# Patient Record
Sex: Male | Born: 1990
Health system: Southern US, Community
[De-identification: ages and names within clinical notes are randomized; demographics above are authoritative.]

## PROBLEM LIST (undated history)

## (undated) DIAGNOSIS — I10 Essential (primary) hypertension: Secondary | ICD-10-CM

## (undated) DIAGNOSIS — E212 Other hyperparathyroidism: Secondary | ICD-10-CM

## (undated) DIAGNOSIS — Z8739 Personal history of other diseases of the musculoskeletal system and connective tissue: Secondary | ICD-10-CM

## (undated) DIAGNOSIS — F419 Anxiety disorder, unspecified: Secondary | ICD-10-CM

## (undated) DIAGNOSIS — E559 Vitamin D deficiency, unspecified: Secondary | ICD-10-CM

## (undated) DIAGNOSIS — R51 Headache: Secondary | ICD-10-CM

## (undated) DIAGNOSIS — R3912 Poor urinary stream: Secondary | ICD-10-CM

## (undated) DIAGNOSIS — G8929 Other chronic pain: Secondary | ICD-10-CM

## (undated) DIAGNOSIS — N201 Calculus of ureter: Secondary | ICD-10-CM

## (undated) DIAGNOSIS — M25559 Pain in unspecified hip: Secondary | ICD-10-CM

## (undated) DIAGNOSIS — N200 Calculus of kidney: Secondary | ICD-10-CM

## (undated) DIAGNOSIS — R319 Hematuria, unspecified: Secondary | ICD-10-CM

## (undated) DIAGNOSIS — E782 Mixed hyperlipidemia: Secondary | ICD-10-CM

## (undated) DIAGNOSIS — R519 Headache, unspecified: Secondary | ICD-10-CM

## (undated) HISTORY — PX: URETEROLITHOTOMY: SHX71

## (undated) HISTORY — PX: EXTRACORPOREAL SHOCK WAVE LITHOTRIPSY: SHX1557

---
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
## 2009-02-28 ENCOUNTER — Ambulatory Visit (HOSPITAL_COMMUNITY): Admission: RE | Admit: 2009-02-28 | Discharge: 2009-02-28 | Payer: Self-pay | Admitting: Internal Medicine

## 2009-03-19 ENCOUNTER — Ambulatory Visit (HOSPITAL_COMMUNITY): Payer: Self-pay | Admitting: Psychology

## 2009-04-05 ENCOUNTER — Ambulatory Visit (HOSPITAL_COMMUNITY): Payer: Self-pay | Admitting: Psychology

## 2009-04-16 ENCOUNTER — Emergency Department: Payer: Self-pay | Admitting: Emergency Medicine

## 2009-04-25 ENCOUNTER — Emergency Department: Payer: Self-pay | Admitting: Emergency Medicine

## 2009-05-14 ENCOUNTER — Emergency Department: Payer: Self-pay | Admitting: Emergency Medicine

## 2009-05-20 ENCOUNTER — Emergency Department: Payer: Self-pay | Admitting: Emergency Medicine

## 2009-05-22 ENCOUNTER — Ambulatory Visit: Payer: Self-pay | Admitting: Urology

## 2009-05-29 ENCOUNTER — Ambulatory Visit: Payer: Self-pay | Admitting: Urology

## 2009-07-03 ENCOUNTER — Emergency Department: Payer: Self-pay | Admitting: Unknown Physician Specialty

## 2009-08-04 ENCOUNTER — Emergency Department: Payer: Self-pay | Admitting: Emergency Medicine

## 2009-08-29 ENCOUNTER — Emergency Department: Payer: Self-pay | Admitting: Emergency Medicine

## 2009-09-01 ENCOUNTER — Emergency Department: Payer: Self-pay | Admitting: Emergency Medicine

## 2009-09-18 ENCOUNTER — Emergency Department (HOSPITAL_COMMUNITY): Admission: EM | Admit: 2009-09-18 | Discharge: 2009-09-19 | Payer: Self-pay | Admitting: Emergency Medicine

## 2009-10-19 ENCOUNTER — Emergency Department (HOSPITAL_COMMUNITY): Admission: EM | Admit: 2009-10-19 | Discharge: 2009-10-19 | Payer: Self-pay | Admitting: Emergency Medicine

## 2009-10-30 ENCOUNTER — Ambulatory Visit: Payer: Self-pay | Admitting: Urology

## 2009-11-07 ENCOUNTER — Ambulatory Visit: Payer: Self-pay | Admitting: Urology

## 2009-11-09 ENCOUNTER — Emergency Department (HOSPITAL_COMMUNITY): Admission: EM | Admit: 2009-11-09 | Discharge: 2009-11-10 | Payer: Self-pay | Admitting: Emergency Medicine

## 2009-12-27 ENCOUNTER — Emergency Department (HOSPITAL_COMMUNITY): Admission: EM | Admit: 2009-12-27 | Discharge: 2009-12-28 | Payer: Self-pay | Admitting: Emergency Medicine

## 2010-01-26 ENCOUNTER — Emergency Department: Payer: Self-pay | Admitting: Emergency Medicine

## 2010-02-10 ENCOUNTER — Emergency Department (HOSPITAL_COMMUNITY): Admission: EM | Admit: 2010-02-10 | Discharge: 2010-02-11 | Payer: Self-pay | Admitting: Emergency Medicine

## 2010-03-28 ENCOUNTER — Emergency Department (HOSPITAL_COMMUNITY)
Admission: EM | Admit: 2010-03-28 | Discharge: 2010-03-29 | Payer: Self-pay | Source: Home / Self Care | Admitting: Emergency Medicine

## 2010-04-07 LAB — BASIC METABOLIC PANEL
BUN: 10 mg/dL (ref 6–23)
CO2: 24 mEq/L (ref 19–32)
Calcium: 8.8 mg/dL (ref 8.4–10.5)
Chloride: 109 mEq/L (ref 96–112)
Creatinine, Ser: 0.99 mg/dL (ref 0.4–1.5)
GFR calc Af Amer: >60 mL/min (ref >60)
GFR calc non Af Amer: >60 mL/min (ref >60)
Glucose, Bld: 104 mg/dL — ABNORMAL HIGH (ref 70–99)
Potassium: 3.5 mEq/L (ref 3.5–5.1)
Sodium: 140 mEq/L (ref 135–145)

## 2010-04-07 LAB — URINALYSIS, ROUTINE W REFLEX MICROSCOPIC
Bilirubin Urine: NEGATIVE
Ketones, ur: NEGATIVE mg/dL
Leukocytes, UA: NEGATIVE
Nitrite: NEGATIVE
Protein, ur: NEGATIVE mg/dL
Specific Gravity, Urine: 1.02 (ref 1.005–1.030)
Urine Glucose, Fasting: NEGATIVE mg/dL
Urobilinogen, UA: 0.2 mg/dL (ref 0.0–1.0)
pH: 6.5 (ref 5.0–8.0)

## 2010-04-07 LAB — URINE MICROSCOPIC-ADD ON

## 2010-04-16 ENCOUNTER — Ambulatory Visit: Payer: Self-pay | Admitting: Urology

## 2010-04-26 ENCOUNTER — Emergency Department: Payer: Self-pay | Admitting: Emergency Medicine

## 2010-06-03 LAB — COMPREHENSIVE METABOLIC PANEL
ALT: 26 U/L (ref 0–53)
AST: 25 U/L (ref 0–37)
Albumin: 4.1 g/dL (ref 3.5–5.2)
Alkaline Phosphatase: 59 U/L (ref 39–117)
BUN: 7 mg/dL (ref 6–23)
CO2: 27 mEq/L (ref 19–32)
Calcium: 9.6 mg/dL (ref 8.4–10.5)
Chloride: 106 mEq/L (ref 96–112)
Creatinine, Ser: 1.02 mg/dL (ref 0.4–1.5)
GFR calc Af Amer: >60 mL/min (ref >60)
GFR calc non Af Amer: >60 mL/min (ref >60)
Glucose, Bld: 95 mg/dL (ref 70–99)
Potassium: 4 mEq/L (ref 3.5–5.1)
Sodium: 143 mEq/L (ref 135–145)
Total Bilirubin: 0.3 mg/dL (ref 0.3–1.2)
Total Protein: 6.7 g/dL (ref 6.0–8.3)

## 2010-06-03 LAB — DIFFERENTIAL
Basophils Absolute: 0.1 10*3/uL (ref 0.0–0.1)
Basophils Relative: 1 % (ref 0–1)
Eosinophils Absolute: 0.3 10*3/uL (ref 0.0–0.7)
Eosinophils Relative: 3 % (ref 0–5)
Lymphocytes Relative: 34 % (ref 12–46)
Lymphs Abs: 2.9 10*3/uL (ref 0.7–4.0)
Monocytes Absolute: 0.9 10*3/uL (ref 0.1–1.0)
Monocytes Relative: 11 % (ref 3–12)
Neutro Abs: 4.4 10*3/uL (ref 1.7–7.7)
Neutrophils Relative %: 52 % (ref 43–77)

## 2010-06-03 LAB — URINALYSIS, ROUTINE W REFLEX MICROSCOPIC
Bilirubin Urine: NEGATIVE
Glucose, UA: NEGATIVE mg/dL
Ketones, ur: NEGATIVE mg/dL
Leukocytes, UA: NEGATIVE
Nitrite: NEGATIVE
Protein, ur: NEGATIVE mg/dL
Specific Gravity, Urine: 1.015 (ref 1.005–1.030)
Urobilinogen, UA: 0.2 mg/dL (ref 0.0–1.0)
pH: 7 (ref 5.0–8.0)

## 2010-06-03 LAB — CBC
HCT: 40.7 % (ref 39.0–52.0)
Hemoglobin: 14.8 g/dL (ref 13.0–17.0)
MCH: 33 pg (ref 26.0–34.0)
MCHC: 36.4 g/dL — ABNORMAL HIGH (ref 30.0–36.0)
MCV: 90.8 fL (ref 78.0–100.0)
Platelets: 294 10*3/uL (ref 150–400)
RBC: 4.48 MIL/uL (ref 4.22–5.81)
RDW: 12.5 % (ref 11.5–15.5)
WBC: 8.5 10*3/uL (ref 4.0–10.5)

## 2010-06-03 LAB — URINE CULTURE
Colony Count: NO GROWTH
Culture  Setup Time: 201111221210
Culture: NO GROWTH

## 2010-06-03 LAB — URINE MICROSCOPIC-ADD ON

## 2010-06-05 LAB — URINALYSIS, ROUTINE W REFLEX MICROSCOPIC
Bilirubin Urine: NEGATIVE
Glucose, UA: NEGATIVE mg/dL
Ketones, ur: NEGATIVE mg/dL
Leukocytes, UA: NEGATIVE
Nitrite: NEGATIVE
Protein, ur: NEGATIVE mg/dL
Specific Gravity, Urine: 1.015 (ref 1.005–1.030)
Urobilinogen, UA: 0.2 mg/dL (ref 0.0–1.0)
pH: 7 (ref 5.0–8.0)

## 2010-06-05 LAB — URINE MICROSCOPIC-ADD ON

## 2010-06-06 LAB — URINALYSIS, ROUTINE W REFLEX MICROSCOPIC
Bilirubin Urine: NEGATIVE
Glucose, UA: NEGATIVE mg/dL
Leukocytes, UA: NEGATIVE
Nitrite: NEGATIVE
Protein, ur: NEGATIVE mg/dL
Specific Gravity, Urine: 1.02 (ref 1.005–1.030)
Urobilinogen, UA: 0.2 mg/dL (ref 0.0–1.0)
pH: 7 (ref 5.0–8.0)

## 2010-06-06 LAB — URINE CULTURE
Colony Count: NO GROWTH
Culture  Setup Time: 201108212245
Culture: NO GROWTH

## 2010-06-06 LAB — URINE MICROSCOPIC-ADD ON

## 2010-06-07 LAB — URINALYSIS, ROUTINE W REFLEX MICROSCOPIC
Bilirubin Urine: NEGATIVE
Glucose, UA: NEGATIVE mg/dL
Leukocytes, UA: NEGATIVE
Nitrite: NEGATIVE
Protein, ur: 30 mg/dL — AB
Specific Gravity, Urine: 1.025 (ref 1.005–1.030)
Urobilinogen, UA: 0.2 mg/dL (ref 0.0–1.0)
pH: 6.5 (ref 5.0–8.0)

## 2010-06-07 LAB — URINE MICROSCOPIC-ADD ON

## 2010-06-08 LAB — RAPID URINE DRUG SCREEN, HOSP PERFORMED
Benzodiazepines: POSITIVE — AB
Cocaine: NOT DETECTED
Tetrahydrocannabinol: NOT DETECTED

## 2010-06-08 LAB — BASIC METABOLIC PANEL
Calcium: 9.9 mg/dL (ref 8.4–10.5)
Creatinine, Ser: 1.04 mg/dL (ref 0.4–1.5)
GFR calc Af Amer: >60 mL/min (ref >60)
GFR calc non Af Amer: >60 mL/min (ref >60)
Sodium: 141 mEq/L (ref 135–145)

## 2010-06-08 LAB — URINALYSIS, ROUTINE W REFLEX MICROSCOPIC
Bilirubin Urine: NEGATIVE
Nitrite: NEGATIVE
Specific Gravity, Urine: 1.025 (ref 1.005–1.030)
Urobilinogen, UA: 0.2 mg/dL (ref 0.0–1.0)
pH: 6 (ref 5.0–8.0)

## 2010-06-08 LAB — DIFFERENTIAL
Eosinophils Absolute: 0.4 10*3/uL (ref 0.0–0.7)
Lymphocytes Relative: 32 % (ref 12–46)
Lymphs Abs: 3.3 10*3/uL (ref 0.7–4.0)
Monocytes Relative: 10 % (ref 3–12)
Neutro Abs: 5.6 10*3/uL (ref 1.7–7.7)
Neutrophils Relative %: 53 % (ref 43–77)

## 2010-06-08 LAB — BLOOD GAS, ARTERIAL
Bicarbonate: 20.9 mEq/L (ref 20.0–24.0)
O2 Saturation: 97 %
Patient temperature: 37
TCO2: 18.1 mmol/L (ref 0–100)

## 2010-06-08 LAB — CBC
Hemoglobin: 14.9 g/dL (ref 13.0–17.0)
Platelets: 305 10*3/uL (ref 150–400)
RBC: 4.66 MIL/uL (ref 4.22–5.81)
WBC: 10.4 10*3/uL (ref 4.0–10.5)

## 2010-06-08 LAB — D-DIMER, QUANTITATIVE: D-Dimer, Quant: 0.22 ug/mL-FEU (ref 0.00–0.48)

## 2010-06-08 LAB — SALICYLATE LEVEL: Salicylate Lvl: <4 mg/dL (ref 2.8–20.0)

## 2010-06-08 LAB — ETHANOL: Alcohol, Ethyl (B): <5 mg/dL (ref 0–10)

## 2010-06-08 LAB — ACETAMINOPHEN LEVEL: Acetaminophen (Tylenol), Serum: <10 ug/mL — ABNORMAL LOW (ref 10–30)

## 2010-06-16 ENCOUNTER — Ambulatory Visit: Payer: Self-pay | Admitting: Urology

## 2010-08-19 ENCOUNTER — Emergency Department: Payer: Self-pay | Admitting: Emergency Medicine

## 2010-09-11 ENCOUNTER — Ambulatory Visit: Payer: Self-pay | Admitting: Urology

## 2010-11-09 ENCOUNTER — Emergency Department: Payer: Self-pay | Admitting: *Deleted

## 2011-01-09 ENCOUNTER — Ambulatory Visit: Payer: Self-pay | Admitting: Internal Medicine

## 2011-01-09 DIAGNOSIS — Z0289 Encounter for other administrative examinations: Secondary | ICD-10-CM

## 2011-01-16 ENCOUNTER — Emergency Department (HOSPITAL_COMMUNITY): Payer: 59

## 2011-01-16 ENCOUNTER — Emergency Department (HOSPITAL_COMMUNITY)
Admission: EM | Admit: 2011-01-16 | Discharge: 2011-01-16 | Disposition: A | Discharge status: Home / Self Care | Payer: 59 | Attending: Emergency Medicine | Admitting: Emergency Medicine

## 2011-01-16 DIAGNOSIS — F17200 Nicotine dependence, unspecified, uncomplicated: Secondary | ICD-10-CM | POA: Insufficient documentation

## 2011-01-16 DIAGNOSIS — J40 Bronchitis, not specified as acute or chronic: Secondary | ICD-10-CM

## 2011-01-16 MED ORDER — PREDNISONE 10 MG PO TABS: ORAL_TABLET | ORAL | Status: DC

## 2011-01-16 MED ORDER — METHYLPREDNISOLONE SODIUM SUCC 125 MG IJ SOLR
125.0000 mg | Freq: Once | INTRAMUSCULAR | Status: AC
  Administered 2011-01-16: 125 mg via INTRAMUSCULAR
  Filled 2011-01-16: qty 2

## 2011-01-16 MED ORDER — PROMETHAZINE-CODEINE 6.25-10 MG/5ML PO SYRP: 5.0000 mL | ORAL_SOLUTION | Freq: Four times a day (QID) | ORAL | Status: AC | PRN

## 2011-01-16 MED ORDER — DOXYCYCLINE HYCLATE 100 MG PO TABS
100.0000 mg | ORAL_TABLET | Freq: Once | ORAL | Status: AC
  Administered 2011-01-16: 100 mg via ORAL
  Filled 2011-01-16: qty 1

## 2011-01-16 MED ORDER — DOXYCYCLINE HYCLATE 100 MG PO CAPS: 100.0000 mg | ORAL_CAPSULE | Freq: Two times a day (BID) | ORAL | Status: AC

## 2011-01-16 MED ORDER — ALBUTEROL SULFATE HFA 108 (90 BASE) MCG/ACT IN AERS
2.0000 | INHALATION_SPRAY | RESPIRATORY_TRACT | Status: DC
  Administered 2011-01-16: 2 via RESPIRATORY_TRACT
  Filled 2011-01-16: qty 6.7

## 2011-01-16 NOTE — ED Notes (Signed)
Pt presents with cough and chest congestion x 1 month. Pt denies seeing primary doctor. Pt states he has been taking OTC medication. NAD at this time. Pt ambulated to triage with steady gate.

## 2011-01-16 NOTE — ED Notes (Addendum)
C/o cough for a month, sometimes yellow sputum production, ?fever at times, pain to left rib area from coughing,

## 2011-01-16 NOTE — ED Provider Notes (Signed)
History     CSN: [id number] Arrival date & time: 01/16/2011  2:01 PM   None     Chief Complaint  Patient presents with  . Cough  . Nasal Congestion    (Consider location/radiation/quality/duration/timing/severity/associated sxs/prior treatment) Patient is a 20 y.o. male presenting with cough. The history is provided by the patient.  Cough This is a new problem. The current episode started more than 1 week ago. The problem has not changed since onset.The cough is productive of sputum. There has been no fever. Associated symptoms include chills, headaches, sore throat, shortness of breath and wheezing. Pertinent negatives include no chest pain. He has tried cough syrup for the symptoms. The treatment provided no relief. He is a smoker. His past medical history is significant for bronchitis and pneumonia. His past medical history does not include COPD or asthma.    Past Medical History  Diagnosis Date  . Renal disorder     History reviewed. No pertinent past surgical history.  History reviewed. No pertinent family history.  History  Substance Use Topics  . Smoking status: Current Everyday Smoker -- 0.5 packs/day  . Smokeless tobacco: Not on file  . Alcohol Use: 3.0 oz/week    5 Cans of beer per week      Review of Systems  Constitutional: Positive for chills. Negative for activity change.       All ROS Neg except as noted in HPI  HENT: Positive for sore throat. Negative for nosebleeds and neck pain.   Eyes: Negative for photophobia and discharge.  Respiratory: Positive for cough, shortness of breath and wheezing.   Cardiovascular: Negative for chest pain and palpitations.  Gastrointestinal: Negative for abdominal pain and blood in stool.  Genitourinary: Negative for dysuria, frequency and hematuria.  Musculoskeletal: Negative for back pain and arthralgias.  Skin: Negative.   Neurological: Positive for headaches. Negative for dizziness, seizures and speech difficulty.    Psychiatric/Behavioral: Negative for hallucinations and confusion.    Allergies  Review of patient's allergies indicates no known allergies.  Home Medications   Current Outpatient Rx  Name Route Sig Dispense Refill  . PHENYLEPHRINE-DM-GG 2.5-5-100 MG/5ML PO LIQD Oral Take 15 mLs by mouth daily as needed. For cough       BP 111/50  Pulse 78  Temp(Src) 98.4 F (36.9 C) (Oral)  Resp 20  Ht 5\' 7"  (1.702 m)  Wt 250 lb (113.399 kg)  BMI 39.16 kg/m2  SpO2 99%  Physical Exam  Nursing note and vitals reviewed. Constitutional: He is oriented to person, place, and time. He appears well-developed and well-nourished.  Non-toxic appearance.  HENT:  Head: Normocephalic.  Right Ear: Tympanic membrane and external ear normal.  Left Ear: Tympanic membrane and external ear normal.  Eyes: EOM and lids are normal. Pupils are equal, round, and reactive to light.  Neck: Normal range of motion. Neck supple. Carotid bruit is not present.  Cardiovascular: Normal rate, regular rhythm, normal heart sounds, intact distal pulses and normal pulses.   Pulmonary/Chest: No respiratory distress. He has wheezes. He has rhonchi.  Abdominal: Soft. Bowel sounds are normal. There is no tenderness. There is no guarding.  Musculoskeletal: Normal range of motion.  Lymphadenopathy:       Head (right side): No submandibular adenopathy present.       Head (left side): No submandibular adenopathy present.    He has no cervical adenopathy.  Neurological: He is alert and oriented to person, place, and time. He has normal strength. No cranial  nerve deficit or sensory deficit.  Skin: Skin is warm and dry.  Psychiatric: He has a normal mood and affect. His speech is normal.    ED Course  Procedures (including critical care time)  Labs Reviewed - No data to display Dg Chest 2 View  01/16/2011  *RADIOLOGY REPORT*  Clinical Data: Cough.  Smoker.  CHEST - 2 VIEW  Comparison: PA and lateral chest 09/18/2009.  Findings:  Lungs are clear.  Heart size is normal.  No pneumothorax or effusion.  IMPRESSION: Negative chest.  Original Report Authenticated By: Bernadene Bell. Maricela Curet, M.D.     No diagnosis found.    MDM  I have reviewed nursing notes, vital signs, and all appropriate lab and imaging results for this patient.        Kathie Dike, [place] 01/20/11 2140

## 2011-01-21 NOTE — ED Provider Notes (Signed)
Medical screening examination/treatment/procedure(s) were performed by non-physician practitioner and as supervising physician I was immediately available for consultation/collaboration.   Joya Gaskins, MD 01/21/11 [phone]

## 2011-02-06 ENCOUNTER — Emergency Department: Payer: Self-pay | Admitting: Emergency Medicine

## 2011-03-26 ENCOUNTER — Emergency Department: Payer: Self-pay | Admitting: Unknown Physician Specialty

## 2011-03-26 LAB — CBC
HCT: 47.3 % (ref 40.0–52.0)
MCHC: 33.6 g/dL (ref 32.0–36.0)
MCV: 95 fL (ref 80–100)
RBC: 4.98 10*6/uL (ref 4.40–5.90)
RDW: 12.5 % (ref 11.5–14.5)
WBC: 7 10*3/uL (ref 3.8–10.6)

## 2011-03-26 LAB — URINALYSIS, COMPLETE
Ketone: NEGATIVE
Nitrite: NEGATIVE
Ph: 6 (ref 4.5–8.0)
Protein: NEGATIVE
Specific Gravity: 1.017 (ref 1.003–1.030)
Squamous Epithelial: 1
WBC UR: 8 /HPF (ref 0–5)

## 2011-03-26 LAB — BASIC METABOLIC PANEL
Anion Gap: 9 (ref 7–16)
Calcium, Total: 9.4 mg/dL (ref 8.5–10.1)
Creatinine: 1.08 mg/dL (ref 0.60–1.30)
EGFR (African American): >60
EGFR (Non-African Amer.): >60
Glucose: 105 mg/dL — ABNORMAL HIGH (ref 65–99)
Osmolality: 279 (ref 275–301)
Sodium: 140 mmol/L (ref 136–145)

## 2011-03-27 LAB — URINE CULTURE

## 2011-05-14 ENCOUNTER — Emergency Department (HOSPITAL_COMMUNITY): Payer: 59

## 2011-05-14 ENCOUNTER — Encounter (HOSPITAL_COMMUNITY): Payer: Self-pay | Admitting: *Deleted

## 2011-05-14 ENCOUNTER — Emergency Department (HOSPITAL_COMMUNITY)
Admission: EM | Admit: 2011-05-14 | Discharge: 2011-05-14 | Disposition: A | Discharge status: Home Health | Payer: 59 | Attending: Emergency Medicine | Admitting: Emergency Medicine

## 2011-05-14 DIAGNOSIS — R109 Unspecified abdominal pain: Secondary | ICD-10-CM | POA: Insufficient documentation

## 2011-05-14 DIAGNOSIS — N200 Calculus of kidney: Secondary | ICD-10-CM | POA: Insufficient documentation

## 2011-05-14 LAB — BASIC METABOLIC PANEL
CO2: 23 mEq/L (ref 19–32)
Chloride: 105 mEq/L (ref 96–112)
Potassium: 3.6 mEq/L (ref 3.5–5.1)
Sodium: 138 mEq/L (ref 135–145)

## 2011-05-14 LAB — DIFFERENTIAL
Basophils Absolute: 0 10*3/uL (ref 0.0–0.1)
Lymphocytes Relative: 38 % (ref 12–46)
Neutro Abs: 3.4 10*3/uL (ref 1.7–7.7)

## 2011-05-14 LAB — CBC
Platelets: 267 10*3/uL (ref 150–400)
RBC: 4.51 MIL/uL (ref 4.22–5.81)
RDW: 12.3 % (ref 11.5–15.5)
WBC: 7.7 10*3/uL (ref 4.0–10.5)

## 2011-05-14 MED ORDER — KETOROLAC TROMETHAMINE 30 MG/ML IJ SOLN
30.0000 mg | Freq: Once | INTRAMUSCULAR | Status: AC
  Administered 2011-05-14: 30 mg via INTRAVENOUS
  Filled 2011-05-14: qty 1

## 2011-05-14 MED ORDER — OXYCODONE-ACETAMINOPHEN 5-325 MG PO TABS: 1.0000 | ORAL_TABLET | Freq: Four times a day (QID) | ORAL | Status: AC | PRN

## 2011-05-14 MED ORDER — PROMETHAZINE HCL 25 MG PO TABS: 25.0000 mg | ORAL_TABLET | Freq: Four times a day (QID) | ORAL | Status: DC | PRN

## 2011-05-14 MED ORDER — ONDANSETRON HCL 4 MG/2ML IJ SOLN
4.0000 mg | Freq: Once | INTRAMUSCULAR | Status: AC
  Administered 2011-05-14: 4 mg via INTRAVENOUS
  Filled 2011-05-14: qty 2

## 2011-05-14 MED ORDER — HYDROMORPHONE HCL PF 1 MG/ML IJ SOLN
INTRAMUSCULAR | Status: AC
  Filled 2011-05-14: qty 1

## 2011-05-14 MED ORDER — HYDROMORPHONE HCL PF 1 MG/ML IJ SOLN
1.0000 mg | Freq: Once | INTRAMUSCULAR | Status: AC
  Administered 2011-05-14: 1 mg via INTRAVENOUS
  Filled 2011-05-14: qty 1

## 2011-05-14 NOTE — ED Notes (Signed)
Pt stable at discharge questions answered by RN and MD

## 2011-05-14 NOTE — ED Provider Notes (Signed)
History    This chart was scribed for Jorge Lennert, MD, MD by Smitty Pluck. The patient was seen in room APA09 and the patient's care was started at 8:16PM.  CSN: [id number]  Arrival date & time 05/14/11  4098   First MD Initiated Contact with Patient 05/14/11 2006      Chief Complaint  Patient presents with  . Flank Pain    (Consider location/radiation/quality/duration/timing/severity/associated sxs/prior treatment) Patient is a 21 y.o. male presenting with flank pain. The history is provided by the patient.  Flank Pain This is a new problem. The current episode started 2 days ago. The problem occurs constantly. The problem has not changed since onset.The symptoms are aggravated by nothing. The symptoms are relieved by nothing. He has tried nothing for the symptoms.   Jorge Nichols is a 21 y.o. male who presents to the Emergency Department complaining of moderate sharp left flank pain onset 2 days ago. The pain has been constant since onset without radiation. Pt reports having history of kidney stones. He approximates having about 40 kidney stones in the past. Pt denies fever, cough and dysuria.   Past Medical History  Diagnosis Date  . Renal disorder     Past Surgical History  Procedure Date  . Lithotripsy     History reviewed. No pertinent family history.  History  Substance Use Topics  . Smoking status: Current Everyday Smoker -- 0.5 packs/day  . Smokeless tobacco: Not on file  . Alcohol Use: 3.0 oz/week    5 Cans of beer per week      Review of Systems  Genitourinary: Positive for flank pain.  All other systems reviewed and are negative.   10 Systems reviewed and are negative for acute change except as noted in the HPI.  Allergies  Review of patient's allergies indicates no known allergies.  Home Medications   Current Outpatient Rx  Name Route Sig Dispense Refill  . PREDNISONE 10 MG PO TABS  6,5,4,3,2,1 - take with food 21 tablet 0    BP 132/83   Pulse 86  Temp(Src) 98.3 F (36.8 C) (Oral)  Resp 18  Ht 5\' 7"  (1.702 m)  Wt 250 lb (113.399 kg)  BMI 39.16 kg/m2  SpO2 100%  Physical Exam  Nursing note and vitals reviewed. Constitutional: He is oriented to person, place, and time. He appears well-developed and well-nourished. No distress.       Appears uncomfortable   HENT:  Head: Normocephalic and atraumatic.  Eyes: Conjunctivae and EOM are normal. No scleral icterus.  Neck: Neck supple. No thyromegaly present.  Cardiovascular: Normal rate and regular rhythm.  Exam reveals no gallop and no friction rub.   No murmur heard. Pulmonary/Chest: No stridor. He has no wheezes. He has no rales. He exhibits no tenderness.  Abdominal: He exhibits no distension. There is tenderness (left flank ). There is no rebound.  Musculoskeletal: Normal range of motion. He exhibits no edema.  Lymphadenopathy:    He has no cervical adenopathy.  Neurological: He is oriented to person, place, and time. Coordination normal.  Skin: No rash noted. No erythema.  Psychiatric: He has a normal mood and affect. His behavior is normal.    ED Course  Procedures (including critical care time)  DIAGNOSTIC STUDIES: Oxygen Saturation is 100% on room air, normal by my interpretation.    COORDINATION OF CARE: 8:20PM EDP discusses pt ED treatment with pt  8:21PM EDP ordered medication: toradol 30 mg/ ml, zofran 4 mg, dilaudid  1 mg    Labs Reviewed  DIFFERENTIAL - Abnormal; Notable for the following:    Eosinophils Relative 7 (*)    All other components within normal limits  CBC  BASIC METABOLIC PANEL   Dg Abd 1 View  05/14/2011  *RADIOLOGY REPORT*  Clinical Data: Left flank pain and history of passing a stone.  ABDOMEN - 1 VIEW  Comparison: Abdominal CT 02/11/2010  Findings: There is a nonobstructive bowel gas pattern with gas in the small and large bowel.  There are calcifications overlying the right renal shadow.  Findings are compatible with kidney  stones. There are no definite calcifications overlying the left renal shadow.  No large stones along the course of the ureters or urinary bladder.  No acute bony abnormality.  IMPRESSION: Probable right kidney stones.  Nonobstructive bowel gas pattern.  Original Report Authenticated By: Richarda Overlie, M.D.     No diagnosis found.  Pt improved with tx and will follow up with the urologist  MDM  Kidney stones The chart was scribed for me under my direct supervision.  I personally performed the history, physical, and medical decision making and all procedures in the evaluation of this patient.Jorge Lennert, MD 05/14/11 2221

## 2011-05-14 NOTE — Discharge Instructions (Signed)
Follow up with dr. javaid next week °

## 2011-05-14 NOTE — ED Notes (Signed)
Pain lt flank for 2 days,

## 2011-05-18 MED FILL — Oxycodone w/ Acetaminophen Tab 5-325 MG: ORAL | Qty: 6 | Status: AC

## 2011-08-08 ENCOUNTER — Emergency Department (HOSPITAL_COMMUNITY): Payer: 59

## 2011-08-08 ENCOUNTER — Emergency Department (HOSPITAL_COMMUNITY)
Admission: EM | Admit: 2011-08-08 | Discharge: 2011-08-09 | Disposition: A | Discharge status: Home / Self Care | Payer: 59 | Attending: Emergency Medicine | Admitting: Emergency Medicine

## 2011-08-08 ENCOUNTER — Encounter (HOSPITAL_COMMUNITY): Payer: Self-pay

## 2011-08-08 DIAGNOSIS — I10 Essential (primary) hypertension: Secondary | ICD-10-CM | POA: Insufficient documentation

## 2011-08-08 DIAGNOSIS — Z79899 Other long term (current) drug therapy: Secondary | ICD-10-CM | POA: Insufficient documentation

## 2011-08-08 DIAGNOSIS — R3919 Other difficulties with micturition: Secondary | ICD-10-CM | POA: Insufficient documentation

## 2011-08-08 DIAGNOSIS — R109 Unspecified abdominal pain: Secondary | ICD-10-CM | POA: Insufficient documentation

## 2011-08-08 DIAGNOSIS — R10819 Abdominal tenderness, unspecified site: Secondary | ICD-10-CM | POA: Insufficient documentation

## 2011-08-08 DIAGNOSIS — R319 Hematuria, unspecified: Secondary | ICD-10-CM | POA: Insufficient documentation

## 2011-08-08 DIAGNOSIS — M546 Pain in thoracic spine: Secondary | ICD-10-CM | POA: Insufficient documentation

## 2011-08-08 DIAGNOSIS — F17200 Nicotine dependence, unspecified, uncomplicated: Secondary | ICD-10-CM | POA: Insufficient documentation

## 2011-08-08 DIAGNOSIS — R110 Nausea: Secondary | ICD-10-CM | POA: Insufficient documentation

## 2011-08-08 DIAGNOSIS — Z87442 Personal history of urinary calculi: Secondary | ICD-10-CM | POA: Insufficient documentation

## 2011-08-08 HISTORY — DX: Essential (primary) hypertension: I10

## 2011-08-08 LAB — URINALYSIS, ROUTINE W REFLEX MICROSCOPIC
Glucose, UA: NEGATIVE mg/dL
Leukocytes, UA: NEGATIVE
Protein, ur: NEGATIVE mg/dL
Specific Gravity, Urine: 1.02 (ref 1.005–1.030)
pH: 6 (ref 5.0–8.0)

## 2011-08-08 LAB — BASIC METABOLIC PANEL
Chloride: 102 mEq/L (ref 96–112)
Creatinine, Ser: 1.06 mg/dL (ref 0.50–1.35)
GFR calc Af Amer: >90 mL/min (ref >90)
GFR calc non Af Amer: >90 mL/min (ref >90)
Potassium: 4.1 mEq/L (ref 3.5–5.1)

## 2011-08-08 LAB — URINE MICROSCOPIC-ADD ON

## 2011-08-08 LAB — CBC
MCH: 32.3 pg (ref 26.0–34.0)
MCHC: 34.7 g/dL (ref 30.0–36.0)
RDW: 12 % (ref 11.5–15.5)

## 2011-08-08 LAB — DIFFERENTIAL
Basophils Absolute: 0.1 10*3/uL (ref 0.0–0.1)
Basophils Relative: 1 % (ref 0–1)
Eosinophils Absolute: 0.7 10*3/uL (ref 0.0–0.7)
Neutro Abs: 4 10*3/uL (ref 1.7–7.7)
Neutrophils Relative %: 45 % (ref 43–77)

## 2011-08-08 MED ORDER — ONDANSETRON HCL 4 MG/2ML IJ SOLN
4.0000 mg | Freq: Once | INTRAMUSCULAR | Status: AC
  Administered 2011-08-08: 4 mg via INTRAVENOUS
  Filled 2011-08-08: qty 2

## 2011-08-08 MED ORDER — HYDROMORPHONE HCL PF 1 MG/ML IJ SOLN
1.0000 mg | Freq: Once | INTRAMUSCULAR | Status: AC
Start: 2011-08-09 — End: 2011-08-09
  Administered 2011-08-09: 1 mg via INTRAVENOUS
  Filled 2011-08-08: qty 1

## 2011-08-08 MED ORDER — METOCLOPRAMIDE HCL 5 MG/ML IJ SOLN
10.0000 mg | Freq: Once | INTRAMUSCULAR | Status: DC
  Filled 2011-08-08: qty 2

## 2011-08-08 MED ORDER — METOCLOPRAMIDE HCL 5 MG/ML IJ SOLN
10.0000 mg | Freq: Once | INTRAMUSCULAR | Status: AC
  Administered 2011-08-08: 10 mg via INTRAVENOUS

## 2011-08-08 MED ORDER — SODIUM CHLORIDE 0.9 % IV SOLN
Freq: Once | INTRAVENOUS | Status: AC
  Administered 2011-08-08: 22:00:00 via INTRAVENOUS

## 2011-08-08 MED ORDER — HYDROMORPHONE HCL PF 1 MG/ML IJ SOLN
1.0000 mg | Freq: Once | INTRAMUSCULAR | Status: AC
  Administered 2011-08-08: 1 mg via INTRAVENOUS
  Filled 2011-08-08: qty 1

## 2011-08-08 MED ORDER — KETOROLAC TROMETHAMINE 30 MG/ML IJ SOLN
30.0000 mg | Freq: Once | INTRAMUSCULAR | Status: AC
  Administered 2011-08-08: 30 mg via INTRAVENOUS
  Filled 2011-08-08: qty 1

## 2011-08-08 NOTE — ED Provider Notes (Signed)
History     CSN: [id number]  Arrival date & time 08/08/11  2043   First MD Initiated Contact with Patient 08/08/11 2112      Chief Complaint  Patient presents with  . Flank Pain    (Consider location/radiation/quality/duration/timing/severity/associated sxs/prior treatment) HPI Comments: Patient with hx of frequent kidney stones c/o sudden onset of left flank pain earlier today.  Describes the pain as sharp and persistent.  He also reports having nausea and blood in his urine today.  States the pain feels similar to previous kidney stones.  He states he passed a small stone earlier this week.  He denies fever, abdominal pain, vomiting or testicular pain or swelling.  Patient is a 21 y.o. male presenting with flank pain. The history is provided by the patient and the spouse.  Flank Pain This is a new problem. The current episode started today. The problem occurs constantly. The problem has been unchanged. Associated symptoms include nausea and urinary symptoms. Pertinent negatives include no abdominal pain, change in bowel habit, chest pain, fever, headaches, neck pain, numbness, rash, sore throat, vomiting or weakness. The symptoms are aggravated by nothing. He has tried nothing for the symptoms. The treatment provided no relief.    Past Medical History  Diagnosis Date  . Renal disorder   . Kidney stones   . Hypertension     Past Surgical History  Procedure Date  . Lithotripsy     No family history on file.  History  Substance Use Topics  . Smoking status: Current Everyday Smoker -- 0.5 packs/day  . Smokeless tobacco: Not on file  . Alcohol Use: 3.0 oz/week    5 Cans of beer per week      Review of Systems  Constitutional: Negative for fever and appetite change.  HENT: Negative for sore throat and neck pain.   Respiratory: Negative for chest tightness and shortness of breath.   Cardiovascular: Negative for chest pain.  Gastrointestinal: Positive for nausea. Negative  for vomiting, abdominal pain and change in bowel habit.  Genitourinary: Positive for hematuria, flank pain and difficulty urinating. Negative for decreased urine volume, discharge, scrotal swelling and testicular pain.  Skin: Negative for rash.  Neurological: Negative for weakness, numbness and headaches.  All other systems reviewed and are negative.    Allergies  Review of patient's allergies indicates no known allergies.  Home Medications   Current Outpatient Rx  Name Route Sig Dispense Refill  . ALPRAZOLAM 0.5 MG PO TABS Oral Take 0.5 mg by mouth at bedtime as needed.    Marland Kitchen METOPROLOL TARTRATE 50 MG PO TABS Oral Take 50 mg by mouth 2 (two) times daily.    Marland Kitchen PREDNISONE 10 MG PO TABS  6,5,4,3,2,1 - take with food 21 tablet 0    BP 128/67  Pulse 78  Temp(Src) 98 F (36.7 C) (Oral)  Resp 22  Ht 5\' 7"  (1.702 m)  Wt 249 lb 9 oz (113.201 kg)  BMI 39.09 kg/m2  SpO2 97%  Physical Exam  Nursing note and vitals reviewed. Constitutional: He is oriented to person, place, and time. He appears well-developed and well-nourished.       Uncomfortable appearing  HENT:  Head: Normocephalic and atraumatic.  Mouth/Throat: Oropharynx is clear and moist.  Cardiovascular: Normal rate, regular rhythm, normal heart sounds and intact distal pulses.   No murmur heard. Pulmonary/Chest: Effort normal and breath sounds normal.  Abdominal: Soft. He exhibits no distension and no mass. There is no hepatosplenomegaly. There is tenderness.  There is no rebound, no guarding and no CVA tenderness.       Mild ttp of the left thoracic paraspinal muscles.    Musculoskeletal: He exhibits no edema.  Neurological: He is alert and oriented to person, place, and time. He exhibits normal muscle tone. Coordination normal.  Skin: Skin is warm and dry.    ED Course  Procedures (including critical care time)  Results for orders placed during the hospital encounter of 08/08/11  URINALYSIS, ROUTINE W REFLEX  MICROSCOPIC      Component Value Range   Color, Urine YELLOW  YELLOW    APPearance HAZY (*) CLEAR    Specific Gravity, Urine 1.020  1.005 - 1.030    pH 6.0  5.0 - 8.0    Glucose, UA NEGATIVE  NEGATIVE (mg/dL)   Hgb urine dipstick LARGE (*) NEGATIVE    Bilirubin Urine NEGATIVE  NEGATIVE    Ketones, ur NEGATIVE  NEGATIVE (mg/dL)   Protein, ur NEGATIVE  NEGATIVE (mg/dL)   Urobilinogen, UA 0.2  0.0 - 1.0 (mg/dL)   Nitrite NEGATIVE  NEGATIVE    Leukocytes, UA NEGATIVE  NEGATIVE   CBC      Component Value Range   WBC 8.9  4.0 - 10.5 (K/uL)   RBC 4.73  4.22 - 5.81 (MIL/uL)   Hemoglobin 15.3  13.0 - 17.0 (g/dL)   HCT 09.8  11.9 - 14.7 (%)   MCV 93.2  78.0 - 100.0 (fL)   MCH 32.3  26.0 - 34.0 (pg)   MCHC 34.7  30.0 - 36.0 (g/dL)   RDW 82.9  56.2 - 13.0 (%)   Platelets 274  150 - 400 (K/uL)  DIFFERENTIAL      Component Value Range   Neutrophils Relative 45  43 - 77 (%)   Neutro Abs 4.0  1.7 - 7.7 (K/uL)   Lymphocytes Relative 39  12 - 46 (%)   Lymphs Abs 3.5  0.7 - 4.0 (K/uL)   Monocytes Relative 7  3 - 12 (%)   Monocytes Absolute 0.7  0.1 - 1.0 (K/uL)   Eosinophils Relative 8 (*) 0 - 5 (%)   Eosinophils Absolute 0.7  0.0 - 0.7 (K/uL)   Basophils Relative 1  0 - 1 (%)   Basophils Absolute 0.1  0.0 - 0.1 (K/uL)  BASIC METABOLIC PANEL      Component Value Range   Sodium 135  135 - 145 (mEq/L)   Potassium 4.1  3.5 - 5.1 (mEq/L)   Chloride 102  96 - 112 (mEq/L)   CO2 21  19 - 32 (mEq/L)   Glucose, Bld 86  70 - 99 (mg/dL)   BUN 18  6 - 23 (mg/dL)   Creatinine, Ser 8.65  0.50 - 1.35 (mg/dL)   Calcium 9.6  8.4 - 78.4 (mg/dL)   GFR calc non Af Amer >90  >90 (mL/min)   GFR calc Af Amer >90  >90 (mL/min)  URINE MICROSCOPIC-ADD ON      Component Value Range   WBC, UA 3-6  <3 (WBC/hpf)   RBC / HPF TOO NUMEROUS TO COUNT  <3 (RBC/hpf)     Ct Abdomen Pelvis Wo Contrast  08/09/2011  *RADIOLOGY REPORT*  Clinical Data: Left flank pain  CT ABDOMEN AND PELVIS WITHOUT CONTRAST  Technique:   Multidetector CT imaging of the abdomen and pelvis was performed following the standard protocol without intravenous contrast.  Comparison: 02/11/2010.  Findings: Limited images through the lung bases demonstrate no significant appreciable abnormality.  The heart size is within normal limits. No pleural or pericardial effusion.  Intra-abdominal organ evaluation is limited in the absence of intravenous contrast.  Within this limitation, unremarkable liver, biliary system, spleen, pancreas, adrenal glands.  Bilateral nonobstructing renal stones.  No hydronephrosis or hydroureter.  No ureteral calculi identified.  No bowel obstruction.  No CT evidence for colitis.  Normal appendix.  No free intraperitoneal air or fluid.  No lymphadenopathy.  Thin-walled bladder.  Normal caliber vasculature.  Small fat containing umbilical hernia.  No acute osseous finding.  IMPRESSION: Bilateral nonobstructing renal stones.  No hydronephrosis or hydroureter.  No ureteral calculi identified.  Original Report Authenticated By: Waneta Martins, M.D.     MDM    Previous medical charts, nursing notes and vitals signs from this visit were reviewed by me   All laboratory results and/or imaging results performed on this visit, if applicable, were reviewed by me and discussed with the patient and/or parent as well as recommendation for follow-up    MEDICATIONS GIVEN IN ED:  Toradol, zofran, reglan and dilaudid IV    Patient with sudden onset of left flank pain and hematuria earlier today.  Has hx of kidney stones and pain today feels similar.   Pain and nausea have improved, No evidence of ureteral stone on CT tonight, abd remains soft, NT.  Will dispense urine strainers and pt agrees to close f/u with urology.      PRESCRIPTIONS GIVEN AT DISCHARGE:  Percocet #25, zofran    Pt stable in ED with no significant deterioration in condition. Pt feels improved after observation and/or treatment in ED. Patient / Family /  Caregiver understand and agree with initial ED impression and plan with expectations set for ED visit.  Patient agrees to return to ED for any worsening symptoms          Ildefonso Keaney L. [place], [place] 08/09/11 4098

## 2011-08-08 NOTE — ED Notes (Signed)
Pain in left flank onset earlier today, hx of kidney stones in the past, nausea, no vomiting

## 2011-08-09 MED ORDER — OXYCODONE-ACETAMINOPHEN 5-325 MG PO TABS: ORAL_TABLET | ORAL | Status: DC

## 2011-08-09 MED ORDER — ONDANSETRON HCL 8 MG PO TABS: 8.0000 mg | ORAL_TABLET | Freq: Four times a day (QID) | ORAL | Status: AC

## 2011-08-09 NOTE — ED Provider Notes (Signed)
Medical screening examination/treatment/procedure(s) were performed by non-physician practitioner and as supervising physician I was immediately available for consultation/collaboration.   Satia Winger, MD 08/09/11 1445 

## 2011-08-09 NOTE — Discharge Instructions (Signed)
Flank Pain Flank pain refers to pain that is located on the side of the body between the upper abdomen and the back. It can be caused by many things. CAUSES  Some of the more common causes of flank pain include:  Muscle strain.   Muscle spasms.   A disease of your spine (vertebral disk disease).   A lung infection (pneumonia).   Fluid around your lungs (pulmonary edema).   A kidney infection.   Kidney stones.   A very painful skin rash on only one side of your body (shingles).   Gallbladder disease.  DIAGNOSIS  Blood tests, urine tests, and X-rays may help your caregiver determine what is wrong. TREATMENT  The treatment of pain depends on the cause. Your caregiver will determine what treatment will work best for you. HOME CARE INSTRUCTIONS   Home care will depend on the cause of your pain.   Some medications may help relieve the pain. Take medication for relief of pain as directed by your caregiver.   Tell your caregiver about any changes in your pain.   Follow up with your caregiver.  SEEK IMMEDIATE MEDICAL CARE IF:   Your pain is not controlled with medication.   The pain increases.   You have abdominal pain.   You have shortness of breath.   You have persistent nausea or vomiting.   You have swelling in your abdomen.   You feel faint or pass out.   You have a temperature by mouth above 102 F (38.9 C), not controlled by medicine.  MAKE SURE YOU:   Understand these instructions.   Will watch your condition.   Will get help right away if you are not doing well or get worse.  Document Released: 04/30/2005 Document Revised: 02/26/2011 Document Reviewed: 08/24/2009 [employer] Patient Information 2012 [employer]. 

## 2011-09-11 ENCOUNTER — Encounter (HOSPITAL_COMMUNITY): Payer: Self-pay | Admitting: *Deleted

## 2011-09-11 ENCOUNTER — Emergency Department (HOSPITAL_COMMUNITY): Payer: 59

## 2011-09-11 ENCOUNTER — Emergency Department (HOSPITAL_COMMUNITY)
Admission: EM | Admit: 2011-09-11 | Discharge: 2011-09-11 | Disposition: A | Discharge status: Home / Self Care | Payer: 59 | Attending: Emergency Medicine | Admitting: Emergency Medicine

## 2011-09-11 DIAGNOSIS — N200 Calculus of kidney: Secondary | ICD-10-CM

## 2011-09-11 DIAGNOSIS — I10 Essential (primary) hypertension: Secondary | ICD-10-CM | POA: Insufficient documentation

## 2011-09-11 DIAGNOSIS — F17200 Nicotine dependence, unspecified, uncomplicated: Secondary | ICD-10-CM | POA: Insufficient documentation

## 2011-09-11 LAB — DIFFERENTIAL
Basophils Absolute: 0 10*3/uL (ref 0.0–0.1)
Basophils Relative: 1 % (ref 0–1)
Eosinophils Absolute: 0.4 10*3/uL (ref 0.0–0.7)
Eosinophils Relative: 5 % (ref 0–5)
Lymphocytes Relative: 39 % (ref 12–46)
Monocytes Absolute: 0.7 10*3/uL (ref 0.1–1.0)

## 2011-09-11 LAB — CBC
HCT: 43.9 % (ref 39.0–52.0)
MCH: 31.9 pg (ref 26.0–34.0)
MCHC: 34.6 g/dL (ref 30.0–36.0)
MCV: 92.2 fL (ref 78.0–100.0)
Platelets: 260 10*3/uL (ref 150–400)
RDW: 12.1 % (ref 11.5–15.5)
WBC: 7.4 10*3/uL (ref 4.0–10.5)

## 2011-09-11 LAB — URINE MICROSCOPIC-ADD ON

## 2011-09-11 LAB — BASIC METABOLIC PANEL
Calcium: 10 mg/dL (ref 8.4–10.5)
Creatinine, Ser: 0.97 mg/dL (ref 0.50–1.35)
GFR calc Af Amer: >90 mL/min (ref >90)
GFR calc non Af Amer: >90 mL/min (ref >90)
Sodium: 139 mEq/L (ref 135–145)

## 2011-09-11 LAB — URINALYSIS, ROUTINE W REFLEX MICROSCOPIC
Ketones, ur: NEGATIVE mg/dL
Protein, ur: NEGATIVE mg/dL
Urobilinogen, UA: 0.2 mg/dL (ref 0.0–1.0)

## 2011-09-11 MED ORDER — OXYCODONE-ACETAMINOPHEN 5-325 MG PO TABS: 2.0000 | ORAL_TABLET | ORAL | Status: AC | PRN

## 2011-09-11 MED ORDER — ONDANSETRON HCL 4 MG PO TABS: 4.0000 mg | ORAL_TABLET | Freq: Four times a day (QID) | ORAL | Status: AC

## 2011-09-11 MED ORDER — HYDROMORPHONE HCL PF 1 MG/ML IJ SOLN
1.0000 mg | Freq: Once | INTRAMUSCULAR | Status: AC
  Administered 2011-09-11: 1 mg via INTRAVENOUS
  Filled 2011-09-11: qty 1

## 2011-09-11 MED ORDER — TAMSULOSIN HCL 0.4 MG PO CAPS: 0.4000 mg | ORAL_CAPSULE | Freq: Every day | ORAL | Status: DC

## 2011-09-11 MED ORDER — SODIUM CHLORIDE 0.9 % IV BOLUS (SEPSIS)
1000.0000 mL | Freq: Once | INTRAVENOUS | Status: AC
  Administered 2011-09-11: 1000 mL via INTRAVENOUS

## 2011-09-11 MED ORDER — KETOROLAC TROMETHAMINE 30 MG/ML IJ SOLN
30.0000 mg | Freq: Once | INTRAMUSCULAR | Status: AC
  Administered 2011-09-11: 30 mg via INTRAVENOUS
  Filled 2011-09-11: qty 1

## 2011-09-11 MED ORDER — IBUPROFEN 800 MG PO TABS: 800.0000 mg | ORAL_TABLET | Freq: Three times a day (TID) | ORAL | Status: AC

## 2011-09-11 MED ORDER — ONDANSETRON HCL 4 MG/2ML IJ SOLN
4.0000 mg | Freq: Once | INTRAMUSCULAR | Status: AC
  Administered 2011-09-11: 4 mg via INTRAVENOUS
  Filled 2011-09-11: qty 2

## 2011-09-11 NOTE — ED Notes (Signed)
Lt flank pain for 2- 3 days.  . Nausea, no vomiting.  Hx of kidney stone

## 2011-09-11 NOTE — Discharge Instructions (Signed)

## 2011-09-11 NOTE — ED Provider Notes (Signed)
History   This chart was scribed for Glynn Octave, MD by Melba Coon. The patient was seen in room APA15/[facility]15 and the patient's care was started at 8:09PM.    CSN: [id number]  Arrival date & time 09/11/11  4098   First MD Initiated Contact with Patient 09/11/11 1959      Chief Complaint  Patient presents with  . Flank Pain    (Consider location/radiation/quality/duration/timing/severity/associated sxs/prior treatment) HPI Jorge Nichols is a 21 y.o. male who presents to the Emergency Department complaining of intermittent, moderate to severe left flank pain with an onset 2 days ago. Hx of kidney stones since age 38. Pt states that pain feels similar to a kidney stone. Last lithotripsy was 2 years ago. Slightly able to void. Nausea present. No HA, fever, neck pain, sore throat, rash, CP, SOB, emesis, diarrhea, dysuria, or extremity pain, edema, weakness, numbness, or tingling. No known allergies. No other pertinent medical symptoms.  Past Medical History  Diagnosis Date  . Renal disorder   . Kidney stones   . Hypertension     Past Surgical History  Procedure Date  . Lithotripsy     History reviewed. No pertinent family history.  History  Substance Use Topics  . Smoking status: Current Everyday Smoker -- 0.5 packs/day  . Smokeless tobacco: Not on file  . Alcohol Use: 3.0 oz/week    5 Cans of beer per week      Review of Systems 10 Systems reviewed and all are negative for acute change except as noted in the HPI.   Allergies  Review of patient's allergies indicates no known allergies.  Home Medications   Current Outpatient Rx  Name Route Sig Dispense Refill  . ALPRAZOLAM 0.5 MG PO TABS Oral Take 0.5 mg by mouth at bedtime as needed.    Marland Kitchen METOPROLOL TARTRATE 50 MG PO TABS Oral Take 50 mg by mouth 2 (two) times daily.    . IBUPROFEN 800 MG PO TABS Oral Take 1 tablet (800 mg total) by mouth 3 (three) times daily. 21 tablet 0  . ONDANSETRON HCL 4 MG PO TABS  Oral Take 1 tablet (4 mg total) by mouth every 6 (six) hours. 12 tablet 0  . OXYCODONE-ACETAMINOPHEN 5-325 MG PO TABS Oral Take 2 tablets by mouth every 4 (four) hours as needed for pain. 15 tablet 0  . TAMSULOSIN HCL 0.4 MG PO CAPS Oral Take 1 capsule (0.4 mg total) by mouth daily. 10 capsule 0    BP 137/67  Pulse 88  Temp 97.9 F (36.6 C) (Oral)  Resp 22  Ht 5\' 7"  (1.702 m)  Wt 250 lb (113.399 kg)  BMI 39.16 kg/m2  SpO2 100%  Physical Exam  Nursing note and vitals reviewed. Constitutional: He is oriented to person, place, and time. He appears well-developed and well-nourished. No distress.  HENT:  Head: Normocephalic and atraumatic.  Eyes: EOM are normal.  Neck: Neck supple. No tracheal deviation present.  Cardiovascular: Normal rate.   Pulmonary/Chest: Effort normal. No respiratory distress.  Abdominal: There is tenderness (LLQ).  Genitourinary:       Testicles mildly tender bilaterally.  Musculoskeletal: Normal range of motion. He exhibits tenderness (Left CVA tenderness).  Neurological: He is alert and oriented to person, place, and time.  Skin: Skin is warm and dry.  Psychiatric: He has a normal mood and affect. His behavior is normal.    ED Course  Procedures (including critical care time)  DIAGNOSTIC STUDIES: Oxygen Saturation is 100% on  room air, normal by my interpretation.    COORDINATION OF CARE:  8:13PM - UA, dilaudid, zofran, torodol, and blood w/u will be ordered for the pt.  9:07PM - recheck; pt still feels pain but has alleviated throughout visit; f/u with urologist   Labs Reviewed  URINALYSIS, ROUTINE W REFLEX MICROSCOPIC - Abnormal; Notable for the following:    Specific Gravity, Urine >1.030 (*)     Hgb urine dipstick LARGE (*)     All other components within normal limits  URINE MICROSCOPIC-ADD ON - Abnormal; Notable for the following:    Bacteria, UA FEW (*)     All other components within normal limits  CBC  DIFFERENTIAL  BASIC METABOLIC  PANEL   No results found.   1. Nephrolithiasis       MDM  Onset of flank pain for the past 3 days of nausea. Constant pain without fever or vomiting.  CT on May 18 showed bilateral nonobstructing stones. Urinalysis shows hematuria without infection.  Patient passed a kidney stone while in the ED. His pain substantially improved after that.  Will treat with analgesics and urology followup.   I personally performed the services described in this documentation, which was scribed in my presence.  The recorded information has been reviewed and considered.         Glynn Octave, MD 09/11/11 [phone]

## 2011-09-11 NOTE — ED Notes (Signed)
Discharge instructions given and reviewed with patient.  Prescriptions given for Percocet, Motrin, Zofran and Flomax; effects and use explained for each.  Patient verbalized understanding of sedating effects of Percocet.  Patient ambulatory; discharged home in good condition.

## 2011-09-18 ENCOUNTER — Ambulatory Visit: Payer: 59 | Admitting: Internal Medicine

## 2011-09-18 DIAGNOSIS — Z0289 Encounter for other administrative examinations: Secondary | ICD-10-CM

## 2011-10-27 ENCOUNTER — Emergency Department: Payer: Self-pay | Admitting: Emergency Medicine

## 2011-10-27 LAB — URINALYSIS, COMPLETE
Bacteria: NONE SEEN
Bilirubin,UR: NEGATIVE
Glucose,UR: NEGATIVE mg/dL (ref 0–75)
Hyaline Cast: 1
Ketone: NEGATIVE
Leukocyte Esterase: NEGATIVE
Nitrite: NEGATIVE
Ph: 5 (ref 4.5–8.0)
RBC,UR: 544 /HPF (ref 0–5)
Squamous Epithelial: <1

## 2011-10-27 LAB — CBC
HCT: 45.5 % (ref 40.0–52.0)
MCH: 31.9 pg (ref 26.0–34.0)
MCHC: 33.7 g/dL (ref 32.0–36.0)
Platelet: 258 10*3/uL (ref 150–440)
RBC: 4.81 10*6/uL (ref 4.40–5.90)

## 2011-10-27 LAB — BASIC METABOLIC PANEL
Calcium, Total: 9.5 mg/dL (ref 8.5–10.1)
Chloride: 107 mmol/L (ref 98–107)
Co2: 27 mmol/L (ref 21–32)
Creatinine: 1.21 mg/dL (ref 0.60–1.30)
EGFR (African American): >60
Potassium: 4 mmol/L (ref 3.5–5.1)
Sodium: 140 mmol/L (ref 136–145)

## 2012-04-16 ENCOUNTER — Emergency Department (HOSPITAL_COMMUNITY): Payer: 59

## 2012-04-16 ENCOUNTER — Encounter (HOSPITAL_COMMUNITY): Payer: Self-pay | Admitting: *Deleted

## 2012-04-16 ENCOUNTER — Emergency Department (HOSPITAL_COMMUNITY)
Admission: EM | Admit: 2012-04-16 | Discharge: 2012-04-16 | Disposition: A | Discharge status: Home / Self Care | Payer: 59 | Attending: Emergency Medicine | Admitting: Emergency Medicine

## 2012-04-16 DIAGNOSIS — J069 Acute upper respiratory infection, unspecified: Secondary | ICD-10-CM

## 2012-04-16 DIAGNOSIS — Z87448 Personal history of other diseases of urinary system: Secondary | ICD-10-CM | POA: Insufficient documentation

## 2012-04-16 DIAGNOSIS — I10 Essential (primary) hypertension: Secondary | ICD-10-CM | POA: Insufficient documentation

## 2012-04-16 DIAGNOSIS — Z79899 Other long term (current) drug therapy: Secondary | ICD-10-CM | POA: Insufficient documentation

## 2012-04-16 DIAGNOSIS — J209 Acute bronchitis, unspecified: Secondary | ICD-10-CM | POA: Insufficient documentation

## 2012-04-16 DIAGNOSIS — J40 Bronchitis, not specified as acute or chronic: Secondary | ICD-10-CM

## 2012-04-16 DIAGNOSIS — F17200 Nicotine dependence, unspecified, uncomplicated: Secondary | ICD-10-CM | POA: Insufficient documentation

## 2012-04-16 DIAGNOSIS — Z87442 Personal history of urinary calculi: Secondary | ICD-10-CM | POA: Insufficient documentation

## 2012-04-16 MED ORDER — ALBUTEROL SULFATE HFA 108 (90 BASE) MCG/ACT IN AERS
2.0000 | INHALATION_SPRAY | RESPIRATORY_TRACT | Status: DC
  Administered 2012-04-16: 2 via RESPIRATORY_TRACT
  Filled 2012-04-16: qty 6.7

## 2012-04-16 MED ORDER — PREDNISONE 50 MG PO TABS
60.0000 mg | ORAL_TABLET | Freq: Once | ORAL | Status: AC
  Administered 2012-04-16: 60 mg via ORAL
  Filled 2012-04-16: qty 1

## 2012-04-16 MED ORDER — HYDROCOD POLST-CHLORPHEN POLST 10-8 MG/5ML PO LQCR
5.0000 mL | Freq: Once | ORAL | Status: AC
  Administered 2012-04-16: 5 mL via ORAL
  Filled 2012-04-16: qty 5

## 2012-04-16 MED ORDER — HYDROCOD POLST-CHLORPHEN POLST 10-8 MG/5ML PO LQCR: 5.0000 mL | Freq: Two times a day (BID) | ORAL | Status: DC | PRN

## 2012-04-16 MED ORDER — PREDNISONE 10 MG PO TABS: ORAL_TABLET | ORAL | Status: DC

## 2012-04-16 NOTE — ED Provider Notes (Signed)
History     CSN: [id number]  Arrival date & time 04/16/12  1622   First MD Initiated Contact with Patient 04/16/12 1641      Chief Complaint  Patient presents with  . Cough    (Consider location/radiation/quality/duration/timing/severity/associated sxs/prior treatment) Patient is a 22 y.o. male presenting with cough. The history is provided by the patient.  Cough This is a new problem. The current episode started more than 2 days ago. The problem occurs every few minutes. The problem has been gradually worsening. The cough is productive of sputum. There has been no fever. Associated symptoms include chills, headaches, rhinorrhea and myalgias. Pertinent negatives include no chest pain, no shortness of breath and no wheezing. He has tried cough syrup for the symptoms. The treatment provided no relief. He is a smoker. His past medical history is significant for bronchitis.    Past Medical History  Diagnosis Date  . Renal disorder   . Kidney stones   . Hypertension     Past Surgical History  Procedure Date  . Lithotripsy     No family history on file.  History  Substance Use Topics  . Smoking status: Current Every Day Smoker -- 0.5 packs/day  . Smokeless tobacco: Not on file  . Alcohol Use: 3.0 oz/week    5 Cans of beer per week      Review of Systems  Constitutional: Positive for chills. Negative for activity change.       All ROS Neg except as noted in HPI  HENT: Positive for rhinorrhea. Negative for nosebleeds and neck pain.   Eyes: Negative for photophobia and discharge.  Respiratory: Positive for cough. Negative for shortness of breath and wheezing.   Cardiovascular: Negative for chest pain and palpitations.  Gastrointestinal: Negative for abdominal pain and blood in stool.  Genitourinary: Negative for dysuria, frequency and hematuria.  Musculoskeletal: Positive for myalgias. Negative for back pain and arthralgias.  Skin: Negative.   Neurological: Positive for  headaches. Negative for dizziness, seizures and speech difficulty.  Psychiatric/Behavioral: Negative for hallucinations and confusion.    Allergies  Review of patient's allergies indicates no known allergies.  Home Medications   Current Outpatient Rx  Name  Route  Sig  Dispense  Refill  . ALPRAZOLAM 0.5 MG PO TABS   Oral   Take 0.5 mg by mouth at bedtime as needed.         Marland Kitchen METOPROLOL TARTRATE 50 MG PO TABS   Oral   Take 50 mg by mouth 2 (two) times daily.         Marland Kitchen TAMSULOSIN HCL 0.4 MG PO CAPS   Oral   Take 1 capsule (0.4 mg total) by mouth daily.   10 capsule   0     BP 136/70  Pulse 99  Temp 98 F (36.7 C)  Resp 20  Ht 5\' 7"  (1.702 m)  Wt 250 lb (113.399 kg)  BMI 39.16 kg/m2  SpO2 97%  Physical Exam  Nursing note and vitals reviewed. Constitutional: He is oriented to person, place, and time. He appears well-developed and well-nourished.  Non-toxic appearance.  HENT:  Head: Normocephalic.  Right Ear: Tympanic membrane and external ear normal.  Left Ear: Tympanic membrane and external ear normal.       Mild to mod nasal congestion.  Eyes: EOM and lids are normal. Pupils are equal, round, and reactive to light.  Neck: Normal range of motion. Neck supple. Carotid bruit is not present.  Cardiovascular: Normal rate,  regular rhythm, normal heart sounds, intact distal pulses and normal pulses.   Pulmonary/Chest: Tachypnea noted. No respiratory distress. He has wheezes. He has rhonchi.  Abdominal: Soft. Bowel sounds are normal. There is no tenderness. There is no guarding.  Musculoskeletal: Normal range of motion.  Lymphadenopathy:       Head (right side): No submandibular adenopathy present.       Head (left side): No submandibular adenopathy present.    He has no cervical adenopathy.  Neurological: He is alert and oriented to person, place, and time. He has normal strength. No cranial nerve deficit or sensory deficit.  Skin: Skin is warm and dry.    Psychiatric: He has a normal mood and affect. His speech is normal.    ED Course  Procedures (including critical care time)  Labs Reviewed - No data to display No results found.   No diagnosis found.    MDM  I have reviewed nursing notes, vital signs, and all appropriate lab and imaging results for this patient. Chest xray is negative for pnuemonia. Vital signs stable. Plan for patient to increase fluids. Wash hands frequently. Use tussionex and prednisone. Pt to return to ED if not imroving.       Kathie Dike, [place] 04/20/12 2136  Kathie Dike, PA 04/21/12 1159

## 2012-04-16 NOTE — ED Notes (Signed)
Pt c/o cough that started Tuesday, cough is productive with green-yellow sputum production, chills last night, recent exposure to brother who was sick with same symptoms,

## 2012-04-21 NOTE — ED Provider Notes (Signed)
Medical screening examination/treatment/procedure(s) were performed by non-physician practitioner and as supervising physician I was immediately available for consultation/collaboration.  Janaysia Mcleroy, MD 04/21/12 1605 

## 2012-04-26 ENCOUNTER — Emergency Department (HOSPITAL_COMMUNITY)
Admission: EM | Admit: 2012-04-26 | Discharge: 2012-04-27 | Disposition: A | Discharge status: Home / Self Care | Payer: 59 | Attending: Emergency Medicine | Admitting: Emergency Medicine

## 2012-04-26 ENCOUNTER — Encounter (HOSPITAL_COMMUNITY): Payer: Self-pay

## 2012-04-26 DIAGNOSIS — N200 Calculus of kidney: Secondary | ICD-10-CM | POA: Insufficient documentation

## 2012-04-26 DIAGNOSIS — R109 Unspecified abdominal pain: Secondary | ICD-10-CM

## 2012-04-26 DIAGNOSIS — R319 Hematuria, unspecified: Secondary | ICD-10-CM | POA: Insufficient documentation

## 2012-04-26 DIAGNOSIS — F17200 Nicotine dependence, unspecified, uncomplicated: Secondary | ICD-10-CM | POA: Insufficient documentation

## 2012-04-26 DIAGNOSIS — Z79899 Other long term (current) drug therapy: Secondary | ICD-10-CM | POA: Insufficient documentation

## 2012-04-26 DIAGNOSIS — I10 Essential (primary) hypertension: Secondary | ICD-10-CM | POA: Insufficient documentation

## 2012-04-26 DIAGNOSIS — Z791 Long term (current) use of non-steroidal anti-inflammatories (NSAID): Secondary | ICD-10-CM | POA: Insufficient documentation

## 2012-04-26 DIAGNOSIS — Z87442 Personal history of urinary calculi: Secondary | ICD-10-CM

## 2012-04-26 DIAGNOSIS — N259 Disorder resulting from impaired renal tubular function, unspecified: Secondary | ICD-10-CM | POA: Insufficient documentation

## 2012-04-26 NOTE — ED Notes (Signed)
Right flank pain x 2 days, states he passed a small kidney stone about an hour or so ago, states continues now.

## 2012-04-27 LAB — URINALYSIS, ROUTINE W REFLEX MICROSCOPIC
Glucose, UA: NEGATIVE mg/dL
Leukocytes, UA: NEGATIVE
Protein, ur: NEGATIVE mg/dL
pH: 6 (ref 5.0–8.0)

## 2012-04-27 LAB — URINE MICROSCOPIC-ADD ON

## 2012-04-27 MED ORDER — OXYCODONE-ACETAMINOPHEN 5-325 MG PO TABS: 2.0000 | ORAL_TABLET | ORAL | Status: DC | PRN

## 2012-04-27 MED ORDER — OXYCODONE-ACETAMINOPHEN 5-325 MG PO TABS: 1.0000 | ORAL_TABLET | ORAL | Status: AC | PRN

## 2012-04-27 MED ORDER — HYDROMORPHONE HCL PF 1 MG/ML IJ SOLN
2.0000 mg | Freq: Once | INTRAMUSCULAR | Status: AC
  Administered 2012-04-27: 2 mg via INTRAMUSCULAR
  Filled 2012-04-27: qty 2

## 2012-04-27 MED ORDER — TAMSULOSIN HCL 0.4 MG PO CAPS
0.4000 mg | ORAL_CAPSULE | Freq: Once | ORAL | Status: AC
  Administered 2012-04-27: 0.4 mg via ORAL
  Filled 2012-04-27: qty 1

## 2012-04-27 MED ORDER — TAMSULOSIN HCL 0.4 MG PO CAPS: 0.4000 mg | ORAL_CAPSULE | Freq: Every day | ORAL | Status: DC

## 2012-04-27 MED ORDER — TAMSULOSIN HCL 0.4 MG PO CAPS
ORAL_CAPSULE | ORAL | Status: AC
  Filled 2012-04-27: qty 1

## 2012-04-27 MED ORDER — ONDANSETRON 8 MG PO TBDP
8.0000 mg | ORAL_TABLET | Freq: Once | ORAL | Status: AC
  Administered 2012-04-27: 8 mg via ORAL
  Filled 2012-04-27: qty 1

## 2012-04-27 MED ORDER — HYDROMORPHONE HCL PF 1 MG/ML IJ SOLN: 1.0000 mg | Freq: Once | INTRAMUSCULAR | Status: DC

## 2012-04-27 NOTE — ED Provider Notes (Signed)
Medical screening examination/treatment/procedure(s) were performed by non-physician practitioner and as supervising physician I was immediately available for consultation/collaboration.  Keifer Habib, MD 04/27/12 0246 

## 2012-04-27 NOTE — ED Provider Notes (Signed)
History     CSN: [id number]  Arrival date & time 04/26/12  2330   First MD Initiated Contact with Patient 04/26/12 2338      Chief Complaint  Patient presents with  . Flank Pain    (Consider location/radiation/quality/duration/timing/severity/associated sxs/prior treatment) HPI Comments: Jorge Nichols is a 22 y.o. Male presenting with a right flank pain for the past 2 days.  He has known bilateral renal stones wit passage of a small stone just prior to arrival,  But has has persistent right flank pain.  Since he passed this stone tonight,  His groin pain has resolved.  He is treated by a urologist in [place],  And was seen 2 weeks ago at which time a Ct scan completed showed multiple tiny stones in his right kidney with one larger 4mm stone,  And a 3mm stone in the left kidney.  He denies fevers, chills, nausea.  He did have  hemauturia which has cleared up.  He has taken ibuprofen without relief of pain.    The history is provided by the patient.    Past Medical History  Diagnosis Date  . Renal disorder   . Kidney stones   . Hypertension     Past Surgical History  Procedure Date  . Lithotripsy     No family history on file.  History  Substance Use Topics  . Smoking status: Current Every Day Smoker -- 0.5 packs/day  . Smokeless tobacco: Not on file  . Alcohol Use: 3.0 oz/week    5 Cans of beer per week      Review of Systems  Constitutional: Negative for fever.  HENT: Negative for congestion, sore throat and neck pain.   Eyes: Negative.   Respiratory: Negative for chest tightness and shortness of breath.   Cardiovascular: Negative for chest pain.  Gastrointestinal: Negative for nausea, vomiting and abdominal pain.  Genitourinary: Positive for dysuria and flank pain. Negative for discharge.  Musculoskeletal: Negative for joint swelling and arthralgias.  Skin: Negative.  Negative for rash and wound.  Neurological: Negative for dizziness, weakness, light-headedness,  numbness and headaches.  Hematological: Negative.   Psychiatric/Behavioral: Negative.     Allergies  Review of patient's allergies indicates no known allergies.  Home Medications   Current Outpatient Rx  Name  Route  Sig  Dispense  Refill  . ALPRAZOLAM 0.5 MG PO TABS   Oral   Take 0.5 mg by mouth at bedtime as needed.         . IBUPROFEN 800 MG PO TABS   Oral   Take 800 mg by mouth every 8 (eight) hours as needed.         Marland Kitchen METOPROLOL TARTRATE 50 MG PO TABS   Oral   Take 50 mg by mouth 2 (two) times daily.         Marland Kitchen HYDROCOD POLST-CPM POLST ER 10-8 MG/5ML PO LQCR   Oral   Take 5 mLs by mouth every 12 (twelve) hours as needed.   100 mL   0   . OXYCODONE-ACETAMINOPHEN 5-325 MG PO TABS   Oral   Take 1 tablet by mouth every 4 (four) hours as needed for pain.   20 tablet   0   . OXYCODONE-ACETAMINOPHEN 5-325 MG PO TABS   Oral   Take 2 tablets by mouth every 4 (four) hours as needed for pain.   6 tablet   0   . PREDNISONE 10 MG PO TABS  6,5,4,3,2,1 - take with food   21 tablet   0   . TAMSULOSIN HCL 0.4 MG PO CAPS   Oral   Take 1 capsule (0.4 mg total) by mouth at bedtime.   7 capsule   0     BP 139/74  Pulse 97  Temp 97.7 F (36.5 C) (Oral)  Resp 18  Ht 5\' 7"  (1.702 m)  Wt 250 lb (113.399 kg)  BMI 39.16 kg/m2  SpO2 98%  Physical Exam  Nursing note and vitals reviewed. Constitutional: He appears well-developed and well-nourished.       Appears uncomfortable.  HENT:  Head: Normocephalic and atraumatic.  Eyes: Conjunctivae normal are normal.  Neck: Normal range of motion.  Cardiovascular: Normal rate, regular rhythm, normal heart sounds and intact distal pulses.   Pulmonary/Chest: Effort normal and breath sounds normal. He has no wheezes.  Abdominal: Soft. Bowel sounds are normal. There is no tenderness. There is CVA tenderness. There is no rebound.  Musculoskeletal: Normal range of motion.  Neurological: He is alert.  Skin: Skin is  warm and dry.  Psychiatric: He has a normal mood and affect.    ED Course  Procedures (including critical care time)  Labs Reviewed  URINALYSIS, ROUTINE W REFLEX MICROSCOPIC - Abnormal; Notable for the following:    Hgb urine dipstick LARGE (*)     Ketones, ur TRACE (*)     All other components within normal limits  URINE MICROSCOPIC-ADD ON   No results found.   1. Right flank pain   2. Hematuria   3. History of renal stone       MDM  Pt with known history of kidney stones, with ct scan completed by specialist 2 weeks ago confirming multiple bilateral stones,  largest 4 mm. Abdominal exam is benign.   Pt placed on percocet,  Flomax,  First dose given here.  Urine is without signs of infection.  Encouraged close f/u with urologist, return here in interim for worsened sx including vomiting,  Fever, worse pain.       Burgess Amor, [place] 04/27/12 [phone]

## 2012-05-05 MED FILL — Oxycodone w/ Acetaminophen Tab 5-325 MG: ORAL | Qty: 6 | Status: AC

## 2012-06-16 ENCOUNTER — Emergency Department: Payer: Self-pay | Admitting: Emergency Medicine

## 2012-06-16 ENCOUNTER — Ambulatory Visit (HOSPITAL_COMMUNITY)
Admission: EM | Admit: 2012-06-16 | Discharge: 2012-06-16 | Disposition: A | Discharge status: Home / Self Care | Payer: 59 | Source: Other Acute Inpatient Hospital | Attending: Neurosurgery | Admitting: Neurosurgery

## 2012-06-16 DIAGNOSIS — R51 Headache: Secondary | ICD-10-CM | POA: Insufficient documentation

## 2012-06-16 LAB — PROTEIN, CSF: Protein, CSF: 43 mg/dL (ref 15–45)

## 2012-06-16 LAB — BASIC METABOLIC PANEL
Anion Gap: 4 — ABNORMAL LOW (ref 7–16)
BUN: 9 mg/dL (ref 7–18)
Chloride: 106 mmol/L (ref 98–107)
Co2: 27 mmol/L (ref 21–32)
EGFR (African American): >60
EGFR (Non-African Amer.): >60
Glucose: 88 mg/dL (ref 65–99)
Osmolality: 272 (ref 275–301)
Sodium: 137 mmol/L (ref 136–145)

## 2012-06-16 LAB — CSF CELL COUNT WITH DIFFERENTIAL
CSF Tube #: 3
Eosinophil: 0 %
Monocytes/Macrophages: 3 %
Neutrophils: 11 %
Neutrophils: 89 %
Other Cells: 0 %
Other Cells: 0 %
RBC (CSF): 215 /mm3
RBC (CSF): 328723 /mm3
WBC (CSF): 191 /mm3
WBC (CSF): 2 /mm3

## 2012-06-16 LAB — CBC
MCH: 32.7 pg (ref 26.0–34.0)
MCHC: 34.1 g/dL (ref 32.0–36.0)
MCV: 96 fL (ref 80–100)
Platelet: 265 10*3/uL (ref 150–440)
RBC: 4.44 10*6/uL (ref 4.40–5.90)

## 2012-06-21 ENCOUNTER — Other Ambulatory Visit (HOSPITAL_COMMUNITY): Payer: Self-pay | Admitting: Interventional Radiology

## 2012-06-21 DIAGNOSIS — R51 Headache: Secondary | ICD-10-CM

## 2012-06-21 DIAGNOSIS — I609 Nontraumatic subarachnoid hemorrhage, unspecified: Secondary | ICD-10-CM

## 2012-06-24 ENCOUNTER — Ambulatory Visit (HOSPITAL_COMMUNITY)
Admission: RE | Admit: 2012-06-24 | Discharge: 2012-06-24 | Disposition: A | Discharge status: Home / Self Care | Payer: 59 | Source: Ambulatory Visit | Attending: Interventional Radiology | Admitting: Interventional Radiology

## 2012-06-24 DIAGNOSIS — I609 Nontraumatic subarachnoid hemorrhage, unspecified: Secondary | ICD-10-CM

## 2012-06-24 DIAGNOSIS — R51 Headache: Secondary | ICD-10-CM

## 2012-07-07 ENCOUNTER — Emergency Department (HOSPITAL_COMMUNITY)
Admission: EM | Admit: 2012-07-07 | Discharge: 2012-07-08 | Disposition: A | Discharge status: Home / Self Care | Payer: 59 | Attending: Emergency Medicine | Admitting: Emergency Medicine

## 2012-07-07 ENCOUNTER — Encounter (HOSPITAL_COMMUNITY): Payer: Self-pay

## 2012-07-07 DIAGNOSIS — I10 Essential (primary) hypertension: Secondary | ICD-10-CM | POA: Insufficient documentation

## 2012-07-07 DIAGNOSIS — F17200 Nicotine dependence, unspecified, uncomplicated: Secondary | ICD-10-CM | POA: Insufficient documentation

## 2012-07-07 DIAGNOSIS — R51 Headache: Secondary | ICD-10-CM | POA: Insufficient documentation

## 2012-07-07 DIAGNOSIS — Z87448 Personal history of other diseases of urinary system: Secondary | ICD-10-CM | POA: Insufficient documentation

## 2012-07-07 DIAGNOSIS — Z79899 Other long term (current) drug therapy: Secondary | ICD-10-CM | POA: Insufficient documentation

## 2012-07-07 DIAGNOSIS — H538 Other visual disturbances: Secondary | ICD-10-CM | POA: Insufficient documentation

## 2012-07-07 DIAGNOSIS — Z87442 Personal history of urinary calculi: Secondary | ICD-10-CM | POA: Insufficient documentation

## 2012-07-07 NOTE — ED Notes (Signed)
Pt states he was told about 3 weeks ago that he had  Cluster headaches, states this headache he has had for 3 weeks.

## 2012-07-08 MED ORDER — KETOROLAC TROMETHAMINE 30 MG/ML IJ SOLN
30.0000 mg | Freq: Once | INTRAMUSCULAR | Status: AC
  Administered 2012-07-08: 30 mg via INTRAVENOUS
  Filled 2012-07-08: qty 1

## 2012-07-08 MED ORDER — ONDANSETRON HCL 4 MG/2ML IJ SOLN
4.0000 mg | Freq: Once | INTRAMUSCULAR | Status: AC
  Administered 2012-07-08: 4 mg via INTRAVENOUS
  Filled 2012-07-08: qty 2

## 2012-07-08 MED ORDER — SODIUM CHLORIDE 0.9 % IV SOLN: Freq: Once | INTRAVENOUS | Status: DC

## 2012-07-08 MED ORDER — HYDROMORPHONE HCL PF 1 MG/ML IJ SOLN
1.0000 mg | Freq: Once | INTRAMUSCULAR | Status: AC
  Administered 2012-07-08: 1 mg via INTRAVENOUS
  Filled 2012-07-08: qty 1

## 2012-07-08 MED ORDER — SODIUM CHLORIDE 0.9 % IV BOLUS (SEPSIS)
1000.0000 mL | Freq: Once | INTRAVENOUS | Status: AC
  Administered 2012-07-08: 1000 mL via INTRAVENOUS

## 2012-07-08 NOTE — ED Notes (Signed)
States he was seen at [facility] and had a CT and spinal tap done and sent to [facility] where they did an MRI.  Neurologist told him it may be a "cluster headache".   Pain is same now as three weeks ago, not as severe tonight, but still painful

## 2012-07-08 NOTE — ED Provider Notes (Signed)
History     CSN: [id number]  Arrival date & time 07/07/12  2322   First MD Initiated Contact with Patient 07/07/12 2347      Chief Complaint  Patient presents with  . Headache    (Consider location/radiation/quality/duration/timing/severity/associated sxs/prior treatment) HPI Jorge Nichols is a 22 y.o. male who presents to the Emergency Department complaining of headache for three weeks. He has been seen in the past by a nuerologist who told him he was having cluster headaches. The headache is on the left side of his head associated with blurred vision. He is currently on verapamil with no effect.  PCP  Dr. Sherwood Gambler  Past Medical History  Diagnosis Date  . Renal disorder   . Kidney stones   . Hypertension     Past Surgical History  Procedure Laterality Date  . Lithotripsy      No family history on file.  History  Substance Use Topics  . Smoking status: Current Every Day Smoker -- 0.50 packs/day  . Smokeless tobacco: Not on file  . Alcohol Use: 3.0 oz/week    5 Cans of beer per week      Review of Systems  Constitutional: Negative for fever.       10 Systems reviewed and are negative for acute change except as noted in the HPI.  HENT: Negative for congestion.   Eyes: Negative for discharge and redness.  Respiratory: Negative for cough and shortness of breath.   Cardiovascular: Negative for chest pain.  Gastrointestinal: Negative for vomiting and abdominal pain.  Musculoskeletal: Negative for back pain.  Skin: Negative for rash.  Neurological: Positive for headaches. Negative for syncope and numbness.  Psychiatric/Behavioral:       No behavior change.    Allergies  Review of patient's allergies indicates no known allergies.  Home Medications   Current Outpatient Rx  Name  Route  Sig  Dispense  Refill  . ALPRAZolam (XANAX) 0.5 MG tablet   Oral   Take 0.5 mg by mouth at bedtime as needed.         . metoprolol (LOPRESSOR) 50 MG tablet   Oral   Take 50  mg by mouth 2 (two) times daily.         . verapamil (CALAN) 80 MG tablet   Oral   Take 80 mg by mouth 3 (three) times daily.         . chlorpheniramine-HYDROcodone (TUSSIONEX PENNKINETIC ER) 10-8 MG/5ML LQCR   Oral   Take 5 mLs by mouth every 12 (twelve) hours as needed.   100 mL   0   . ibuprofen (ADVIL,MOTRIN) 800 MG tablet   Oral   Take 800 mg by mouth every 8 (eight) hours as needed.         Marland Kitchen oxyCODONE-acetaminophen (PERCOCET/ROXICET) 5-325 MG per tablet   Oral   Take 2 tablets by mouth every 4 (four) hours as needed for pain.   6 tablet   0   . predniSONE (DELTASONE) 10 MG tablet      6,5,4,3,2,1 - take with food   21 tablet   0   . Tamsulosin HCl (FLOMAX) 0.4 MG CAPS   Oral   Take 1 capsule (0.4 mg total) by mouth at bedtime.   7 capsule   0     BP 117/64  Pulse 63  Temp(Src) 98 F (36.7 C) (Oral)  Resp 18  Ht 5\' 7"  (1.702 m)  Wt 250 lb (113.399 kg)  BMI 39.15  kg/m2  SpO2 97%  Physical Exam  Nursing note and vitals reviewed. Constitutional: He is oriented to person, place, and time. He appears well-developed and well-nourished.  Awake, alert, nontoxic appearance.  HENT:  Head: Normocephalic and atraumatic.  Right Ear: External ear normal.  Left Ear: External ear normal.  R 20/20 L 20/20  Eyes: EOM are normal. Pupils are equal, round, and reactive to light.  Neck: Neck supple.  Cardiovascular: Normal rate and intact distal pulses.   Pulmonary/Chest: Effort normal and breath sounds normal. He exhibits no tenderness.  Abdominal: Soft. Bowel sounds are normal. There is no tenderness. There is no rebound.  Musculoskeletal: He exhibits no tenderness.  Baseline ROM, no obvious new focal weakness.  Neurological: He is alert and oriented to person, place, and time. No cranial nerve deficit.  Mental status and motor strength appears baseline for patient and situation.  Skin: No rash noted.  Psychiatric: He has a normal mood and affect.    ED  Course  Procedures (including critical care time)    0119 Headache has improved to 1/10 from 9/10. MDM  Patient with headache x 3 weeks. Given IVF, zofran and dilaudid with relief. Pt stable in ED with no significant deterioration in condition.The patient appears reasonably screened and/or stabilized for discharge and I doubt any other medical condition or other [facility] requiring further screening, evaluation, or treatment in the ED at this time prior to discharge.  MDM Reviewed: nursing note and vitals           Nicoletta Dress. Colon Branch, MD 07/08/12 0454

## 2012-08-08 ENCOUNTER — Other Ambulatory Visit (HOSPITAL_COMMUNITY): Payer: Self-pay | Admitting: Internal Medicine

## 2012-08-08 DIAGNOSIS — R945 Abnormal results of liver function studies: Secondary | ICD-10-CM

## 2012-08-11 ENCOUNTER — Ambulatory Visit (HOSPITAL_COMMUNITY): Payer: 59 | Attending: Internal Medicine

## 2012-08-16 ENCOUNTER — Encounter (INDEPENDENT_AMBULATORY_CARE_PROVIDER_SITE_OTHER): Payer: Self-pay | Admitting: *Deleted

## 2012-08-29 ENCOUNTER — Ambulatory Visit (INDEPENDENT_AMBULATORY_CARE_PROVIDER_SITE_OTHER): Payer: 59 | Admitting: Internal Medicine

## 2012-09-09 ENCOUNTER — Encounter: Payer: Self-pay | Admitting: Internal Medicine

## 2012-09-09 ENCOUNTER — Ambulatory Visit (INDEPENDENT_AMBULATORY_CARE_PROVIDER_SITE_OTHER): Payer: 59 | Admitting: Internal Medicine

## 2012-09-09 VITALS — BP 120/96 | HR 84 | Ht 67.0 in | Wt 266.2 lb

## 2012-09-09 DIAGNOSIS — E8881 Metabolic syndrome: Secondary | ICD-10-CM

## 2012-09-09 DIAGNOSIS — E785 Hyperlipidemia, unspecified: Secondary | ICD-10-CM

## 2012-09-09 DIAGNOSIS — G4730 Sleep apnea, unspecified: Secondary | ICD-10-CM

## 2012-09-09 DIAGNOSIS — E782 Mixed hyperlipidemia: Secondary | ICD-10-CM

## 2012-09-09 DIAGNOSIS — G478 Other sleep disorders: Secondary | ICD-10-CM

## 2012-09-09 DIAGNOSIS — Z8249 Family history of ischemic heart disease and other diseases of the circulatory system: Secondary | ICD-10-CM

## 2012-09-09 NOTE — Patient Instructions (Addendum)
Your physician recommends that you schedule a follow-up appointment in: 3 months  Have labwork done one week prior to visit and [facility].

## 2012-09-09 NOTE — Progress Notes (Signed)
OFFICE NOTE  Chief Complaint:  Evaluate and treat dyslipidemia  Primary Care Physician: Cassell Smiles., MD  HPI:  Jorge Nichols is a pleasant 22 year old male with no significant past medical history. He recently presented to his primary care provider with episodes of feeling hot and flushed especially when he takes his blood pressure is becoming elevated. He is a Psychologist, occupational, and was formerly in [employer]. Since being discharged from the [employer], he has gained approximately 30 or 40 pounds. Currently his weight puts him in a morbid obese category for his height. Subtle he had laboratory work performed in your office which demonstrated marked dyslipidemia. His total cholesterol was 360, triglycerides 1258, HDL 40, and LDL could not be calculated. He also has mildly elevated AST and ALT, concerning for possible non-alcoholic fatty liver disease. His fasting glucose is 91. He denies any symptoms such as chest pain, shortness of breath, palpitations, presyncope or syncopal symptoms. [place] is [place], but he does not do structured exercise.  PMHx:  Past Medical History  Diagnosis Date  . Renal disorder   . Kidney stones   . Hypertension   . Hyperlipidemia     Past Surgical History  Procedure Laterality Date  . Lithotripsy      FAMHx:  Family History  Problem Relation Age of Onset  . Hypertension Father     SOCHx:   reports that he has been smoking.  He does not have any smokeless tobacco history on file. He reports that he drinks about 3.0 ounces of alcohol per week. He reports that he does not use illicit drugs.  ALLERGIES:  No Known Allergies  ROS: A comprehensive review of systems was negative except for: Respiratory: positive for dyspnea on exertion  HOME MEDS: Current Outpatient Prescriptions  Medication Sig Dispense Refill  . ALPRAZolam (XANAX) 0.5 MG tablet Take 0.5 mg by mouth at bedtime as needed.      . metoprolol (LOPRESSOR) 50 MG tablet Take 50  mg by mouth 2 (two) times daily.      . chlorpheniramine-HYDROcodone (TUSSIONEX PENNKINETIC ER) 10-8 MG/5ML LQCR Take 5 mLs by mouth every 12 (twelve) hours as needed.  100 mL  0  . ibuprofen (ADVIL,MOTRIN) 800 MG tablet Take 800 mg by mouth every 8 (eight) hours as needed.      Marland Kitchen oxyCODONE-acetaminophen (PERCOCET/ROXICET) 5-325 MG per tablet Take 2 tablets by mouth every 4 (four) hours as needed for pain.  6 tablet  0  . predniSONE (DELTASONE) 10 MG tablet 6,5,4,3,2,1 - take with food  21 tablet  0  . Tamsulosin HCl (FLOMAX) 0.4 MG CAPS Take 1 capsule (0.4 mg total) by mouth at bedtime.  7 capsule  0  . verapamil (CALAN) 80 MG tablet Take 80 mg by mouth 3 (three) times daily.       No current facility-administered medications for this visit.    LABS/IMAGING: No results found for this or any previous visit (from the past 48 hour(s)). No results found.  VITALS: BP 120/96  Pulse 84  Ht 5\' 7"  (1.702 m)  Wt 266 lb 3.2 oz (120.748 kg)  BMI 41.68 kg/m2  EXAM: General appearance: alert and no distress Neck: no adenopathy, no carotid bruit, no JVD, supple, symmetrical, trachea midline and thyroid not enlarged, symmetric, no tenderness/mass/nodules Lungs: clear to auscultation bilaterally Heart: regular rate and rhythm, S1, S2 normal, no murmur, click, rub or gallop Abdomen: morbidly obese Extremities: extremities normal, atraumatic, no cyanosis or edema  Pulses: 2+ and symmetric Skin: Skin color, texture, turgor normal. No rashes or lesions Neurologic: Grossly normal  EKG: Normal sinus rhythm at 84  ASSESSMENT: 1. Marked mixed dyslipidemia 2. Mildly elevated liver enzymes, concerning for possible nonalcoholic fatty liver disease 3. Probable metabolic syndrome 4. Strong family history of premature cardiovascular disease  PLAN: 1.   Jorge Nichols has a number of risk factors for coronary artery disease, despite his young age. He has a strong family history of cardiovascular disease and a  marked dyslipidemia. This study was reportedly fasting, demonstrating very high triglycerides. As a range that is at high risk for pancreatitis. He needs to make serious adjustments in his diet including decreasing sugars, cholesterol intake, and increasing weight loss and exercise. He had a long discussion on healthier eating habits and his wife seems to radiate into the back. Will like to wait about 3 months and recheck a lipid profile that time. If his cholesterol is still elevated, he may benefit from the addition of a fibroid for his triglycerides, and possibly treatment of LDL cholesterol. We will order a lipid NMR profile with a direct LDL to be performed in 3 months.  Thank you for referring Jorge Nichols, it is imperative that we intervene early in his life to avoid probable cardiovascular disease that he is at high risk for later in life.  Jorge Nose, MD, [facility] Attending Cardiologist [facility]  Ceceilia Cephus C 09/09/2012, 9:36 AM

## 2012-09-25 ENCOUNTER — Emergency Department: Payer: Self-pay | Admitting: Emergency Medicine

## 2012-09-25 LAB — COMPREHENSIVE METABOLIC PANEL
Alkaline Phosphatase: 99 U/L (ref 50–136)
Anion Gap: 10 (ref 7–16)
BUN: 6 mg/dL — ABNORMAL LOW (ref 7–18)
Calcium, Total: 8.6 mg/dL (ref 8.5–10.1)
Chloride: 109 mmol/L — ABNORMAL HIGH (ref 98–107)
Co2: 24 mmol/L (ref 21–32)
Creatinine: 0.85 mg/dL (ref 0.60–1.30)
EGFR (African American): >60
Osmolality: 283 (ref 275–301)
SGOT(AST): 52 U/L — ABNORMAL HIGH (ref 15–37)
Sodium: 143 mmol/L (ref 136–145)
Total Protein: 6.8 g/dL (ref 6.4–8.2)

## 2012-09-25 LAB — CBC WITH DIFFERENTIAL/PLATELET
Basophil #: 0.1 10*3/uL (ref 0.0–0.1)
Basophil %: 1.8 %
Eosinophil #: 0.5 10*3/uL (ref 0.0–0.7)
Eosinophil %: 7.1 %
HCT: 42.5 % (ref 40.0–52.0)
Lymphocyte #: 2.5 10*3/uL (ref 1.0–3.6)
Lymphocyte %: 35.7 %
MCH: 33.9 pg (ref 26.0–34.0)
Monocyte #: 0.6 x10 3/mm (ref 0.2–1.0)
Monocyte %: 8.9 %
Neutrophil %: 46.5 %
RBC: 4.41 10*6/uL (ref 4.40–5.90)
WBC: 7.1 10*3/uL (ref 3.8–10.6)

## 2012-09-25 LAB — URINALYSIS, COMPLETE
Bilirubin,UR: NEGATIVE
Glucose,UR: NEGATIVE mg/dL (ref 0–75)
Ketone: NEGATIVE
Nitrite: NEGATIVE
Specific Gravity: 1.016 (ref 1.003–1.030)
Squamous Epithelial: NONE SEEN
WBC UR: 1 /HPF (ref 0–5)

## 2012-09-25 LAB — APTT: Activated PTT: 29.6 secs (ref 23.6–35.9)

## 2012-10-01 ENCOUNTER — Encounter: Payer: Self-pay | Admitting: Internal Medicine

## 2012-11-24 ENCOUNTER — Ambulatory Visit: Payer: 59 | Admitting: Internal Medicine

## 2012-12-02 ENCOUNTER — Ambulatory Visit: Payer: 59 | Admitting: Internal Medicine

## 2013-01-04 ENCOUNTER — Emergency Department (HOSPITAL_COMMUNITY)
Admission: EM | Admit: 2013-01-04 | Discharge: 2013-01-04 | Disposition: A | Discharge status: Home / Self Care | Payer: 59 | Attending: Emergency Medicine | Admitting: Emergency Medicine

## 2013-01-04 ENCOUNTER — Encounter (HOSPITAL_COMMUNITY): Payer: Self-pay | Admitting: Emergency Medicine

## 2013-01-04 DIAGNOSIS — I10 Essential (primary) hypertension: Secondary | ICD-10-CM | POA: Insufficient documentation

## 2013-01-04 DIAGNOSIS — N200 Calculus of kidney: Secondary | ICD-10-CM

## 2013-01-04 DIAGNOSIS — F17200 Nicotine dependence, unspecified, uncomplicated: Secondary | ICD-10-CM | POA: Insufficient documentation

## 2013-01-04 DIAGNOSIS — E785 Hyperlipidemia, unspecified: Secondary | ICD-10-CM | POA: Insufficient documentation

## 2013-01-04 DIAGNOSIS — Z79899 Other long term (current) drug therapy: Secondary | ICD-10-CM | POA: Insufficient documentation

## 2013-01-04 LAB — URINE MICROSCOPIC-ADD ON

## 2013-01-04 LAB — URINALYSIS, ROUTINE W REFLEX MICROSCOPIC
Bilirubin Urine: NEGATIVE
Ketones, ur: NEGATIVE mg/dL
Nitrite: NEGATIVE
Urobilinogen, UA: 0.2 mg/dL (ref 0.0–1.0)

## 2013-01-04 MED ORDER — OXYCODONE-ACETAMINOPHEN 5-325 MG PO TABS: 1.0000 | ORAL_TABLET | ORAL | Status: DC | PRN

## 2013-01-04 MED ORDER — PROMETHAZINE HCL 25 MG PO TABS: 25.0000 mg | ORAL_TABLET | Freq: Four times a day (QID) | ORAL | Status: DC | PRN

## 2013-01-04 MED ORDER — KETOROLAC TROMETHAMINE 60 MG/2ML IM SOLN
60.0000 mg | Freq: Once | INTRAMUSCULAR | Status: AC
  Administered 2013-01-04: 60 mg via INTRAMUSCULAR
  Filled 2013-01-04: qty 2

## 2013-01-04 MED ORDER — HYDROCODONE-ACETAMINOPHEN 5-325 MG PO TABS
2.0000 | ORAL_TABLET | Freq: Once | ORAL | Status: AC
  Administered 2013-01-04: 2 via ORAL
  Filled 2013-01-04: qty 2

## 2013-01-04 MED ORDER — TAMSULOSIN HCL 0.4 MG PO CAPS: 0.4000 mg | ORAL_CAPSULE | Freq: Two times a day (BID) | ORAL | Status: DC

## 2013-01-04 MED ORDER — NAPROXEN 500 MG PO TABS: 500.0000 mg | ORAL_TABLET | Freq: Two times a day (BID) | ORAL | Status: DC

## 2013-01-04 NOTE — ED Notes (Signed)
Pt c/o left flank pain and lower back pain x couple of hours.

## 2013-01-04 NOTE — ED Provider Notes (Signed)
CSN: [id number]     Arrival date & time 01/04/13  1844 History   First MD Initiated Contact with Patient 01/04/13 1905     Chief Complaint  Patient presents with  . Flank Pain  . Back Pain   (Consider location/radiation/quality/duration/timing/severity/associated sxs/prior Treatment) HPI  P[t presents with acute onset of L flank pain that started 2 hours ago while he was "working on my car".  It has been intermittent - nothing makes it better or worse, no radiation, no abd pain or testicular / scrotum pain.  No nausea, states he can't make urine.  Nothing makes better or worse.  He claims to have had > 50 episodes of kidney stones in the past.  States that he has had lithotrips as well in the past.    Past Medical History  Diagnosis Date  . Renal disorder   . Kidney stones   . Hypertension   . Hyperlipidemia    Past Surgical History  Procedure Laterality Date  . Lithotripsy     Family History  Problem Relation Age of Onset  . Hypertension Father    History  Substance Use Topics  . Smoking status: Current Every Day Smoker -- 0.50 packs/day  . Smokeless tobacco: Not on file  . Alcohol Use: 3.0 oz/week    5 Cans of beer per week    Review of Systems  All other systems reviewed and are negative.    Allergies  Review of patient's allergies indicates no known allergies.  Home Medications   Current Outpatient Rx  Name  Route  Sig  Dispense  Refill  . ALPRAZolam (XANAX) 0.5 MG tablet   Oral   Take 0.5 mg by mouth at bedtime as needed.         . metoprolol (LOPRESSOR) 50 MG tablet   Oral   Take 50 mg by mouth 2 (two) times daily.         . naproxen (NAPROSYN) 500 MG tablet   Oral   Take 1 tablet (500 mg total) by mouth 2 (two) times daily with a meal.   30 tablet   0   . oxyCODONE-acetaminophen (PERCOCET) 5-325 MG per tablet   Oral   Take 1 tablet by mouth every 4 (four) hours as needed for pain.   20 tablet   0   . EXPIRED: promethazine (PHENERGAN) 25  MG tablet   Oral   Take 1 tablet (25 mg total) by mouth every 6 (six) hours as needed for nausea.   15 tablet   0   . promethazine (PHENERGAN) 25 MG tablet   Oral   Take 1 tablet (25 mg total) by mouth every 6 (six) hours as needed for nausea.   12 tablet   0   . tamsulosin (FLOMAX) 0.4 MG CAPS capsule   Oral   Take 1 capsule (0.4 mg total) by mouth 2 (two) times daily.   10 capsule   0    BP 142/79  Pulse 76  Temp(Src) 98 F (36.7 C) (Oral)  Resp 18  Ht 5\' 7"  (1.702 m)  Wt 250 lb (113.399 kg)  BMI 39.15 kg/m2  SpO2 98% Physical Exam  Nursing note and vitals reviewed. Constitutional: He appears well-developed and well-nourished. No distress.  HENT:  Head: Normocephalic and atraumatic.  Mouth/Throat: Oropharynx is clear and moist. No oropharyngeal exudate.  Eyes: Conjunctivae and EOM are normal. Pupils are equal, round, and reactive to light. Right eye exhibits no discharge. Left eye exhibits no  discharge. No scleral icterus.  Neck: Normal range of motion. Neck supple. No JVD present. No thyromegaly present.  Cardiovascular: Normal rate, regular rhythm, normal heart sounds and intact distal pulses.  Exam reveals no gallop and no friction rub.   No murmur heard. Pulmonary/Chest: Effort normal and breath sounds normal. No respiratory distress. He has no wheezes. He has no rales.  Abdominal: Soft. Bowel sounds are normal. He exhibits no distension and no mass. There is no tenderness.  No abd ttp, no guarding, no CVA ttp  Musculoskeletal: Normal range of motion. He exhibits no edema and no tenderness.  Lymphadenopathy:    He has no cervical adenopathy.  Neurological: He is alert. Coordination normal.  Skin: Skin is warm and dry. No rash noted. No erythema.  Psychiatric: He has a normal mood and affect. His behavior is normal.    ED Course  Procedures (including critical care time) Labs Review Labs Reviewed  URINALYSIS, ROUTINE W REFLEX MICROSCOPIC - Abnormal; Notable  for the following:    Color, Urine RED (*)    APPearance HAZY (*)    pH >9.0 (*)    Hgb urine dipstick LARGE (*)    All other components within normal limits  URINE MICROSCOPIC-ADD ON - Abnormal; Notable for the following:    Squamous Epithelial / LPF FEW (*)    Bacteria, UA FEW (*)    All other components within normal limits   Imaging Review No results found.  EKG Interpretation   None       MDM   1. Kidney stone    The pt is stable appearing, he has no reproducible ttp, no vomting - check UA, pain meds ordered for possible KS.  No CT At this time - the last 2 CT scans he has had over the last 3 years have shown multiple intra renal stones but no ureteral stones while the pt was having pain.  UA with hematuria, no indication for CT given good control of symptoms.  Already seeing a Urologist in [place].  Meds given in ED:  Medications  ketorolac (TORADOL) injection 60 mg (60 mg Intramuscular Given 01/04/13 1932)  HYDROcodone-acetaminophen (NORCO/VICODIN) 5-325 MG per tablet 2 tablet (2 tablets Oral Given 01/04/13 1933)    New Prescriptions   NAPROXEN (NAPROSYN) 500 MG TABLET    Take 1 tablet (500 mg total) by mouth 2 (two) times daily with a meal.   OXYCODONE-ACETAMINOPHEN (PERCOCET) 5-325 MG PER TABLET    Take 1 tablet by mouth every 4 (four) hours as needed for pain.   PROMETHAZINE (PHENERGAN) 25 MG TABLET    Take 1 tablet (25 mg total) by mouth every 6 (six) hours as needed for nausea.   TAMSULOSIN (FLOMAX) 0.4 MG CAPS CAPSULE    Take 1 capsule (0.4 mg total) by mouth 2 (two) times daily.      Vida Roller, MD 01/04/13 [phone]

## 2013-01-19 ENCOUNTER — Emergency Department: Payer: Self-pay | Admitting: Emergency Medicine

## 2013-01-19 LAB — CBC
HGB: 15.4 g/dL (ref 13.0–18.0)
MCH: 33.6 pg (ref 26.0–34.0)
MCHC: 35.1 g/dL (ref 32.0–36.0)
Platelet: 298 10*3/uL (ref 150–440)
RDW: 12.4 % (ref 11.5–14.5)
WBC: 10.4 10*3/uL (ref 3.8–10.6)

## 2013-01-19 LAB — BASIC METABOLIC PANEL
Anion Gap: 8 (ref 7–16)
BUN: 7 mg/dL (ref 7–18)
Calcium, Total: 9 mg/dL (ref 8.5–10.1)
Co2: 24 mmol/L (ref 21–32)
EGFR (African American): >60
EGFR (Non-African Amer.): >60
Glucose: 102 mg/dL — ABNORMAL HIGH (ref 65–99)
Potassium: 3.8 mmol/L (ref 3.5–5.1)
Sodium: 138 mmol/L (ref 136–145)

## 2013-01-19 LAB — URINALYSIS, COMPLETE
Bacteria: NONE SEEN
Bilirubin,UR: NEGATIVE
Glucose,UR: NEGATIVE mg/dL (ref 0–75)
Ketone: NEGATIVE
Leukocyte Esterase: NEGATIVE
Ph: 6 (ref 4.5–8.0)
Specific Gravity: 1.01 (ref 1.003–1.030)
Squamous Epithelial: <1

## 2013-01-29 ENCOUNTER — Emergency Department (HOSPITAL_COMMUNITY): Payer: 59

## 2013-01-29 ENCOUNTER — Emergency Department (HOSPITAL_COMMUNITY)
Admission: EM | Admit: 2013-01-29 | Discharge: 2013-01-29 | Disposition: A | Discharge status: Home / Self Care | Payer: 59 | Attending: Emergency Medicine | Admitting: Emergency Medicine

## 2013-01-29 ENCOUNTER — Encounter (HOSPITAL_COMMUNITY): Payer: Self-pay | Admitting: Emergency Medicine

## 2013-01-29 DIAGNOSIS — S76219A Strain of adductor muscle, fascia and tendon of unspecified thigh, initial encounter: Secondary | ICD-10-CM

## 2013-01-29 DIAGNOSIS — F17200 Nicotine dependence, unspecified, uncomplicated: Secondary | ICD-10-CM | POA: Insufficient documentation

## 2013-01-29 DIAGNOSIS — Z87448 Personal history of other diseases of urinary system: Secondary | ICD-10-CM | POA: Insufficient documentation

## 2013-01-29 DIAGNOSIS — Z862 Personal history of diseases of the blood and blood-forming organs and certain disorders involving the immune mechanism: Secondary | ICD-10-CM | POA: Insufficient documentation

## 2013-01-29 DIAGNOSIS — Z79899 Other long term (current) drug therapy: Secondary | ICD-10-CM | POA: Insufficient documentation

## 2013-01-29 DIAGNOSIS — Z791 Long term (current) use of non-steroidal anti-inflammatories (NSAID): Secondary | ICD-10-CM | POA: Insufficient documentation

## 2013-01-29 DIAGNOSIS — I10 Essential (primary) hypertension: Secondary | ICD-10-CM | POA: Insufficient documentation

## 2013-01-29 DIAGNOSIS — Z8639 Personal history of other endocrine, nutritional and metabolic disease: Secondary | ICD-10-CM | POA: Insufficient documentation

## 2013-01-29 DIAGNOSIS — W1789XA Other fall from one level to another, initial encounter: Secondary | ICD-10-CM | POA: Insufficient documentation

## 2013-01-29 DIAGNOSIS — Y929 Unspecified place or not applicable: Secondary | ICD-10-CM | POA: Insufficient documentation

## 2013-01-29 DIAGNOSIS — IMO0002 Reserved for concepts with insufficient information to code with codable children: Secondary | ICD-10-CM | POA: Insufficient documentation

## 2013-01-29 DIAGNOSIS — Y939 Activity, unspecified: Secondary | ICD-10-CM | POA: Insufficient documentation

## 2013-01-29 DIAGNOSIS — Z87442 Personal history of urinary calculi: Secondary | ICD-10-CM | POA: Insufficient documentation

## 2013-01-29 MED ORDER — HYDROCODONE-ACETAMINOPHEN 5-325 MG PO TABS
2.0000 | ORAL_TABLET | Freq: Once | ORAL | Status: AC
  Administered 2013-01-29: 2 via ORAL
  Filled 2013-01-29: qty 2

## 2013-01-29 MED ORDER — ONDANSETRON HCL 4 MG PO TABS
4.0000 mg | ORAL_TABLET | Freq: Once | ORAL | Status: AC
  Administered 2013-01-29: 4 mg via ORAL
  Filled 2013-01-29: qty 1

## 2013-01-29 MED ORDER — KETOROLAC TROMETHAMINE 10 MG PO TABS
10.0000 mg | ORAL_TABLET | Freq: Once | ORAL | Status: AC
  Administered 2013-01-29: 10 mg via ORAL
  Filled 2013-01-29: qty 1

## 2013-01-29 MED ORDER — MELOXICAM 15 MG PO TABS: 15.0000 mg | ORAL_TABLET | Freq: Every day | ORAL | Status: DC

## 2013-01-29 MED ORDER — HYDROCODONE-ACETAMINOPHEN 5-325 MG PO TABS: ORAL_TABLET | ORAL | Status: DC

## 2013-01-29 NOTE — ED Notes (Signed)
Pt states that he fell from a tree stand 6 days ago, still complains of pain to left hip area. Cms intact distal.

## 2013-01-29 NOTE — ED Provider Notes (Signed)
Medical screening examination/treatment/procedure(s) were performed by non-physician practitioner and as supervising physician I was immediately available for consultation/collaboration.  Flint Melter, MD 01/29/13 1520

## 2013-01-29 NOTE — ED Provider Notes (Signed)
CSN: [id number]     Arrival date & time 01/29/13  1130 History   First MD Initiated Contact with Patient 01/29/13 1203     Chief Complaint  Patient presents with  . Hip Pain   (Consider location/radiation/quality/duration/timing/severity/associated sxs/prior Treatment) HPI Comments: Patient is a 22 year old male who states he fell approximately 10 feet from a tree stand about 6 days ago. The patient states he has been having some pain of the left hip and groin area since that time. The pain seems to (simply worse. The patient determined to come to the emergency department today for evaluation do to pain not responding to conservative measures. The patient states he has been limping on his left leg since the accident. He did not lose control of bowel or bladder. He has not had complaint of back pain since that time. The patient is not on any blood thinning type medications. His been no previous operations or procedures involving the left hip or groin.  Patient is a 22 y.o. male presenting with hip pain. The history is provided by the patient.  Hip Pain Pertinent negatives include no abdominal pain, arthralgias, chest pain, coughing or neck pain.    Past Medical History  Diagnosis Date  . Renal disorder   . Kidney stones   . Hypertension   . Hyperlipidemia    Past Surgical History  Procedure Laterality Date  . Lithotripsy     Family History  Problem Relation Age of Onset  . Hypertension Father    History  Substance Use Topics  . Smoking status: Current Every Day Smoker -- 0.50 packs/day  . Smokeless tobacco: Not on file  . Alcohol Use: 3.0 oz/week    5 Cans of beer per week    Review of Systems  Constitutional: Negative for activity change.       All ROS Neg except as noted in HPI  HENT: Negative for nosebleeds.   Eyes: Negative for photophobia and discharge.  Respiratory: Negative for cough, shortness of breath and wheezing.   Cardiovascular: Negative for chest pain and  palpitations.  Gastrointestinal: Negative for abdominal pain and blood in stool.  Genitourinary: Negative for dysuria, frequency and hematuria.  Musculoskeletal: Negative for arthralgias, back pain and neck pain.  Skin: Negative.   Neurological: Negative for dizziness, seizures and speech difficulty.  Psychiatric/Behavioral: Negative for hallucinations and confusion.    Allergies  Review of patient's allergies indicates no known allergies.  Home Medications   Current Outpatient Rx  Name  Route  Sig  Dispense  Refill  . ALPRAZolam (XANAX) 0.5 MG tablet   Oral   Take 0.5 mg by mouth at bedtime as needed.         Marland Kitchen HYDROcodone-acetaminophen (NORCO) 5-325 MG per tablet      1 or 2 po q4h prn pain   20 tablet   0   . meloxicam (MOBIC) 15 MG tablet   Oral   Take 1 tablet (15 mg total) by mouth daily.   7 tablet   0   . metoprolol (LOPRESSOR) 50 MG tablet   Oral   Take 50 mg by mouth 2 (two) times daily.         . naproxen (NAPROSYN) 500 MG tablet   Oral   Take 1 tablet (500 mg total) by mouth 2 (two) times daily with a meal.   30 tablet   0   . oxyCODONE-acetaminophen (PERCOCET) 5-325 MG per tablet   Oral   Take 1 tablet  by mouth every 4 (four) hours as needed for pain.   20 tablet   0   . EXPIRED: promethazine (PHENERGAN) 25 MG tablet   Oral   Take 1 tablet (25 mg total) by mouth every 6 (six) hours as needed for nausea.   15 tablet   0   . promethazine (PHENERGAN) 25 MG tablet   Oral   Take 1 tablet (25 mg total) by mouth every 6 (six) hours as needed for nausea.   12 tablet   0   . tamsulosin (FLOMAX) 0.4 MG CAPS capsule   Oral   Take 1 capsule (0.4 mg total) by mouth 2 (two) times daily.   10 capsule   0    BP 140/62  Pulse 79  Temp(Src) 98.7 F (37.1 C) (Oral)  Ht 5\' 7"  (1.702 m)  Wt 240 lb (108.863 kg)  BMI 37.58 kg/m2 Physical Exam  Nursing note and vitals reviewed. Constitutional: He is oriented to person, place, and time. He appears  well-developed and well-nourished.  Non-toxic appearance.  HENT:  Head: Normocephalic.  Right Ear: Tympanic membrane and external ear normal.  Left Ear: Tympanic membrane and external ear normal.  Eyes: EOM and lids are normal. Pupils are equal, round, and reactive to light.  Neck: Normal range of motion. Neck supple. Carotid bruit is not present.  Cardiovascular: Normal rate, regular rhythm, normal heart sounds, intact distal pulses and normal pulses.   Pulmonary/Chest: Breath sounds normal. No respiratory distress.  Abdominal: Soft. Bowel sounds are normal. There is no tenderness. There is no guarding.  Musculoskeletal: Normal range of motion.  No shortening appreciated of the lower extremities. There is pain with range of motion of the left hip. There is pain in the left upper groin area with attempted range of motion of the left hip. There is no hematoma or deformity of the thigh area. There is full range of motion of the left knee, as well as the left ankle and toes. The dorsalis pedis pulses 2+.  Lymphadenopathy:       Head (right side): No submandibular adenopathy present.       Head (left side): No submandibular adenopathy present.    He has no cervical adenopathy.  Neurological: He is alert and oriented to person, place, and time. He has normal strength. No cranial nerve deficit or sensory deficit.  Skin: Skin is warm and dry.  Psychiatric: He has a normal mood and affect. His speech is normal.    ED Course  Procedures (including critical care time) Labs Review Labs Reviewed - No data to display Imaging Review Dg Hip Complete Left  01/29/2013   CLINICAL DATA:  Left hip pain post fall  EXAM: LEFT HIP - COMPLETE 2+ VIEW  COMPARISON:  None.  FINDINGS: Three views of the left hip submitted. No acute fracture or subluxation. No radiopaque foreign body.  IMPRESSION: Negative.   Electronically Signed   By: Natasha Mead M.D.   On: 01/29/2013 12:02    EKG Interpretation   None        MDM   1. Groin strain, initial encounter    *I have reviewed nursing notes, vital signs, and all appropriate lab and imaging results for this patient.**  X-ray of the left hip is negative for fracture or subluxation. Patient is fitted with crutches. He is referred to Dr. Hilda Lias for orthopedic evaluation of his hip pain and groin strain. Prescription for Mobic and Norco given to the patient.  Lyla Son  Beverely Pace, PA-C 01/29/13 1345

## 2013-02-15 ENCOUNTER — Emergency Department: Payer: Self-pay | Admitting: Emergency Medicine

## 2013-02-27 ENCOUNTER — Emergency Department: Payer: Self-pay | Admitting: Emergency Medicine

## 2013-03-09 ENCOUNTER — Emergency Department: Payer: Self-pay | Admitting: Emergency Medicine

## 2013-03-20 DIAGNOSIS — M8700 Idiopathic aseptic necrosis of unspecified bone: Secondary | ICD-10-CM | POA: Insufficient documentation

## 2013-03-23 HISTORY — PX: TOTAL HIP ARTHROPLASTY: SHX124

## 2013-03-23 HISTORY — PX: HIP SURGERY: SHX245

## 2013-04-03 ENCOUNTER — Emergency Department: Payer: Self-pay | Admitting: Emergency Medicine

## 2013-04-17 ENCOUNTER — Emergency Department: Payer: Self-pay | Admitting: Emergency Medicine

## 2013-04-17 LAB — COMPREHENSIVE METABOLIC PANEL
AST: 40 U/L — AB (ref 15–37)
Albumin: 3.9 g/dL (ref 3.4–5.0)
Alkaline Phosphatase: 92 U/L
Anion Gap: 1 — ABNORMAL LOW (ref 7–16)
BUN: 8 mg/dL (ref 7–18)
Bilirubin,Total: 0.3 mg/dL (ref 0.2–1.0)
CALCIUM: 9.7 mg/dL (ref 8.5–10.1)
CO2: 30 mmol/L (ref 21–32)
Chloride: 106 mmol/L (ref 98–107)
Creatinine: 1.03 mg/dL (ref 0.60–1.30)
Glucose: 116 mg/dL — ABNORMAL HIGH (ref 65–99)
Osmolality: 273 (ref 275–301)
POTASSIUM: 3.9 mmol/L (ref 3.5–5.1)
SGPT (ALT): 57 U/L (ref 12–78)
Sodium: 137 mmol/L (ref 136–145)
TOTAL PROTEIN: 7.6 g/dL (ref 6.4–8.2)

## 2013-04-17 LAB — URINALYSIS, COMPLETE
BACTERIA: NONE SEEN
Bilirubin,UR: NEGATIVE
Glucose,UR: NEGATIVE mg/dL (ref 0–75)
Ketone: NEGATIVE
Leukocyte Esterase: NEGATIVE
NITRITE: NEGATIVE
PH: 5 (ref 4.5–8.0)
PROTEIN: NEGATIVE
Specific Gravity: 1.014 (ref 1.003–1.030)
Squamous Epithelial: 1

## 2013-04-17 LAB — CBC
HCT: 45.3 % (ref 40.0–52.0)
HGB: 15.1 g/dL (ref 13.0–18.0)
MCH: 32 pg (ref 26.0–34.0)
MCHC: 33.3 g/dL (ref 32.0–36.0)
MCV: 96 fL (ref 80–100)
Platelet: 316 10*3/uL (ref 150–440)
RBC: 4.72 10*6/uL (ref 4.40–5.90)
RDW: 12.3 % (ref 11.5–14.5)
WBC: 7.9 10*3/uL (ref 3.8–10.6)

## 2013-04-17 LAB — LIPASE, BLOOD: LIPASE: 211 U/L (ref 73–393)

## 2013-05-08 ENCOUNTER — Emergency Department (HOSPITAL_COMMUNITY): Payer: 59

## 2013-05-08 ENCOUNTER — Encounter (HOSPITAL_COMMUNITY): Payer: Self-pay | Admitting: Emergency Medicine

## 2013-05-08 ENCOUNTER — Other Ambulatory Visit: Payer: Self-pay

## 2013-05-08 ENCOUNTER — Emergency Department (HOSPITAL_COMMUNITY)
Admission: EM | Admit: 2013-05-08 | Discharge: 2013-05-08 | Disposition: A | Discharge status: Home / Self Care | Payer: 59 | Attending: Emergency Medicine | Admitting: Emergency Medicine

## 2013-05-08 DIAGNOSIS — I10 Essential (primary) hypertension: Secondary | ICD-10-CM | POA: Insufficient documentation

## 2013-05-08 DIAGNOSIS — Z79899 Other long term (current) drug therapy: Secondary | ICD-10-CM | POA: Insufficient documentation

## 2013-05-08 DIAGNOSIS — M25519 Pain in unspecified shoulder: Secondary | ICD-10-CM | POA: Insufficient documentation

## 2013-05-08 DIAGNOSIS — Z862 Personal history of diseases of the blood and blood-forming organs and certain disorders involving the immune mechanism: Secondary | ICD-10-CM | POA: Insufficient documentation

## 2013-05-08 DIAGNOSIS — Z87442 Personal history of urinary calculi: Secondary | ICD-10-CM | POA: Insufficient documentation

## 2013-05-08 DIAGNOSIS — F17200 Nicotine dependence, unspecified, uncomplicated: Secondary | ICD-10-CM | POA: Insufficient documentation

## 2013-05-08 DIAGNOSIS — F411 Generalized anxiety disorder: Secondary | ICD-10-CM | POA: Insufficient documentation

## 2013-05-08 DIAGNOSIS — G8929 Other chronic pain: Secondary | ICD-10-CM | POA: Insufficient documentation

## 2013-05-08 DIAGNOSIS — R55 Syncope and collapse: Secondary | ICD-10-CM | POA: Insufficient documentation

## 2013-05-08 DIAGNOSIS — Z8639 Personal history of other endocrine, nutritional and metabolic disease: Secondary | ICD-10-CM | POA: Insufficient documentation

## 2013-05-08 HISTORY — DX: Other chronic pain: G89.29

## 2013-05-08 HISTORY — DX: Anxiety disorder, unspecified: F41.9

## 2013-05-08 HISTORY — DX: Headache: R51

## 2013-05-08 HISTORY — DX: Headache, unspecified: R51.9

## 2013-05-08 LAB — CBC WITH DIFFERENTIAL/PLATELET
BASOS ABS: 0 10*3/uL (ref 0.0–0.1)
Basophils Relative: 0 % (ref 0–1)
EOS ABS: 0.2 10*3/uL (ref 0.0–0.7)
Eosinophils Relative: 2 % (ref 0–5)
HCT: 43.2 % (ref 39.0–52.0)
Hemoglobin: 14.9 g/dL (ref 13.0–17.0)
LYMPHS PCT: 18 % (ref 12–46)
Lymphs Abs: 1.5 10*3/uL (ref 0.7–4.0)
MCH: 32.7 pg (ref 26.0–34.0)
MCHC: 34.5 g/dL (ref 30.0–36.0)
MCV: 94.7 fL (ref 78.0–100.0)
MONO ABS: 0.8 10*3/uL (ref 0.1–1.0)
Monocytes Relative: 9 % (ref 3–12)
Neutro Abs: 6.2 10*3/uL (ref 1.7–7.7)
Neutrophils Relative %: 71 % (ref 43–77)
Platelets: 299 10*3/uL (ref 150–400)
RBC: 4.56 MIL/uL (ref 4.22–5.81)
RDW: 12.2 % (ref 11.5–15.5)
WBC: 8.7 10*3/uL (ref 4.0–10.5)

## 2013-05-08 LAB — BASIC METABOLIC PANEL
BUN: 8 mg/dL (ref 6–23)
CHLORIDE: 98 meq/L (ref 96–112)
CO2: 26 mEq/L (ref 19–32)
Calcium: 9.3 mg/dL (ref 8.4–10.5)
Creatinine, Ser: 1 mg/dL (ref 0.50–1.35)
GFR calc Af Amer: >90 mL/min (ref >90)
GFR calc non Af Amer: >90 mL/min (ref >90)
GLUCOSE: 105 mg/dL — AB (ref 70–99)
Potassium: 3.9 mEq/L (ref 3.7–5.3)
Sodium: 138 mEq/L (ref 137–147)

## 2013-05-08 LAB — RAPID URINE DRUG SCREEN, HOSP PERFORMED
Amphetamines: NOT DETECTED
BARBITURATES: NOT DETECTED
Benzodiazepines: NOT DETECTED
COCAINE: NOT DETECTED
Opiates: POSITIVE — AB
Tetrahydrocannabinol: NOT DETECTED

## 2013-05-08 LAB — URINALYSIS, ROUTINE W REFLEX MICROSCOPIC
BILIRUBIN URINE: NEGATIVE
Glucose, UA: NEGATIVE mg/dL
Hgb urine dipstick: NEGATIVE
Ketones, ur: NEGATIVE mg/dL
Leukocytes, UA: NEGATIVE
Nitrite: NEGATIVE
PH: 6 (ref 5.0–8.0)
Protein, ur: NEGATIVE mg/dL
Specific Gravity, Urine: 1.01 (ref 1.005–1.030)
Urobilinogen, UA: 0.2 mg/dL (ref 0.0–1.0)

## 2013-05-08 LAB — ETHANOL

## 2013-05-08 LAB — D-DIMER, QUANTITATIVE: D-Dimer, Quant: 0.54 ug/mL-FEU — ABNORMAL HIGH (ref 0.00–0.48)

## 2013-05-08 LAB — TROPONIN I: Troponin I: <0.3 ng/mL (ref <0.30)

## 2013-05-08 MED ORDER — LORAZEPAM 1 MG PO TABS: ORAL_TABLET | ORAL | Status: DC

## 2013-05-08 MED ORDER — SODIUM CHLORIDE 0.9 % IV BOLUS (SEPSIS)
500.0000 mL | Freq: Once | INTRAVENOUS | Status: AC
  Administered 2013-05-08: 500 mL via INTRAVENOUS

## 2013-05-08 MED ORDER — LORAZEPAM 1 MG PO TABS
1.0000 mg | ORAL_TABLET | Freq: Once | ORAL | Status: AC
  Administered 2013-05-08: 1 mg via ORAL
  Filled 2013-05-08: qty 1

## 2013-05-08 MED ORDER — IOHEXOL 350 MG/ML SOLN
100.0000 mL | Freq: Once | INTRAVENOUS | Status: AC | PRN
  Administered 2013-05-08: 100 mL via INTRAVENOUS

## 2013-05-08 MED ORDER — SODIUM CHLORIDE 0.9 % IV SOLN
INTRAVENOUS | Status: DC
  Administered 2013-05-08: 125 mL/h via INTRAVENOUS

## 2013-05-08 NOTE — ED Notes (Signed)
Pt complained up left sided arm and face pain during episode.

## 2013-05-08 NOTE — ED Notes (Signed)
Pt had near syncopal episode at home, no other complaints per [facility].

## 2013-05-08 NOTE — ED Notes (Signed)
Pt HR from 90 to 118 during orthostatic BP

## 2013-05-08 NOTE — ED Provider Notes (Signed)
CSN: [id number]     Arrival date & time 05/08/13  1701 History   First MD Initiated Contact with Patient 05/08/13 1706     Chief Complaint  Patient presents with  . Near Syncope     HPI Pt was seen at 1725. Per pt, c/o sudden onset and persistence of multiple intermittent episodes of near syncope that began today. Pt states he was sitting on the couch at home watching TV when he developed a sharp pain in his left anterior shoulder area, followed by a "warm sensation all over my head." States he then felt his "heart rate go slow then fast," followed by a feeling like "I was going to pass out." States this near syncope feeling continues to occur, "coming and going." Pt has significant hx of anxiety. Denies CP/SOB, no abd pain, no N/V/D, no back pain, no headache, no visual changes, no focal motor weakness, no tingling/numbness in extremities, no ataxia, no slurred speech, no facial droop.     Past Medical History  Diagnosis Date  . Hypertension   . Hyperlipidemia   . Kidney stones   . Chronic headaches   . Anxiety    Past Surgical History  Procedure Laterality Date  . Lithotripsy     Family History  Problem Relation Age of Onset  . Hypertension Father    History  Substance Use Topics  . Smoking status: Current Every Day Smoker -- 0.50 packs/day  . Smokeless tobacco: Not on file  . Alcohol Use: 3.0 oz/week    5 Cans of beer per week    Review of Systems ROS: Statement: All systems negative except as marked or noted in the HPI; Constitutional: Negative for fever and chills. ; ; Eyes: Negative for eye pain, redness and discharge. ; ; ENMT: Negative for ear pain, hoarseness, nasal congestion, sinus pressure and sore throat. ; ; Cardiovascular: +CP, palpitations. Negative for diaphoresis, dyspnea and peripheral edema. ; ; Respiratory: Negative for cough, wheezing and stridor. ; ; Gastrointestinal: Negative for nausea, vomiting, diarrhea, abdominal pain, blood in stool, hematemesis,  jaundice and rectal bleeding. . ; ; Genitourinary: Negative for dysuria, flank pain and hematuria. ; ; Musculoskeletal: Negative for back pain and neck pain. Negative for swelling and trauma.; ; Skin: Negative for pruritus, rash, abrasions, blisters, bruising and skin lesion.; ; Neuro: +near syncope. Negative for headache and neck stiffness. Negative for weakness, altered level of consciousness , altered mental status, extremity weakness, paresthesias, involuntary movement, seizure and syncope.     Allergies  Review of patient's allergies indicates no known allergies.  Home Medications   Current Outpatient Rx  Name  Route  Sig  Dispense  Refill  . ALPRAZolam (XANAX) 0.5 MG tablet   Oral   Take 0.5 mg by mouth at bedtime as needed for anxiety.          Marland Kitchen HYDROcodone-acetaminophen (NORCO) 5-325 MG per tablet      1 or 2 po q4h prn pain   20 tablet   0   . metoprolol (LOPRESSOR) 50 MG tablet   Oral   Take 50 mg by mouth 2 (two) times daily.         Marland Kitchen EXPIRED: promethazine (PHENERGAN) 25 MG tablet   Oral   Take 1 tablet (25 mg total) by mouth every 6 (six) hours as needed for nausea.   15 tablet   0    BP 126/70  Pulse 110  Temp(Src) 98.2 F (36.8 C) (Oral)  Resp 20  Ht 5\' 7"  (1.702 m)  Wt 250 lb (113.399 kg)  BMI 39.15 kg/m2  SpO2 97% BP 113/56  Pulse 93  Temp(Src) 98.2 F (36.8 C) (Oral)  Resp 18  Ht 5\' 7"  (1.702 m)  Wt 250 lb (113.399 kg)  BMI 39.15 kg/m2  SpO2 96%  Physical Exam 1730: Physical examination:  Nursing notes reviewed; Vital signs and O2 SAT reviewed;  Constitutional: Well developed, Well nourished, Well hydrated, In no acute distress; Head:  Normocephalic, atraumatic; Eyes: EOMI, PERRL, No scleral icterus; ENMT: TM's clear bilat. +edemetous nasal turbinates bilat with clear rhinorrhea. Mouth and pharynx normal, Mucous membranes moist; Neck: Supple, Full range of motion, No lymphadenopathy; Cardiovascular: Tachycardic rate and rhythm, No gallop;  Respiratory: Breath sounds clear & equal bilaterally, No wheezes.  Speaking full sentences with ease, Normal respiratory effort/excursion; Chest: +left upper anterior-lateral chest wall tender to palp. No rash, no soft tissue crepitus, no deformity. Movement normal; Abdomen: Soft, Nontender, Nondistended, Normal bowel sounds; Genitourinary: No CVA tenderness; Extremities: Pulses normal, No tenderness, No edema, No calf edema or asymmetry.; Neuro: AA&Ox3, Major CN grossly intact.Speech clear.  No facial droop.  No nystagmus. Grips equal. Strength 5/5 equal bilat UE's and LE's.  DTR 2/4 equal bilat UE's and LE's.  No gross sensory deficits.  Normal cerebellar testing bilat UE's (finger-nose) and LE's (heel-shin). Climbs on and off stretcher easily by himself. Gait steady..; Skin: Color normal, Warm, Dry.; Psych:  Anxious.    ED Course  Procedures   1735:  During my exam pt stated he "feel like I'm going to pass out again." Appeared anxious, mildly tachypneic, monitor NSR no ectopy, HR 90, which increased to 110 as he was speaking; Sats remain 99% R/A, SBP stable 120-130's. Pt questioned if he felt anxious and agreed he did. States he has an upcoming "operation on both my hips" due to chronic pain, and "it gets me upset" because "I can't hold a job and I just sit home all day." Denies SI. States he usually takes xanax, but feels "lately it's not working anymore." Will dose ativan.   2030:  Pt is not orthostatic. Workup reassuring. Neuro exam continues intact and unchanged. States all his symptoms are completely resolved, he "feels a lot better" after ativan and wants to go home now. Requesting rx for same. Pt climbing on and off stretcher and ambulating around his exam room with steady gait, easy resps, NAD.  Dx and testing d/w pt and family.  Questions answered.  Verb understanding, agreeable to d/c home with outpt f/u.    EKG Interpretation   None       MDM  MDM Reviewed: previous chart, nursing  note and vitals Reviewed previous: labs, ECG and MRI Interpretation: labs, ECG, x-ray and MRI     Date: 05/08/2013  Rate: 108  Rhythm: sinus tachycardia  QRS Axis: normal  Intervals: normal  ST/T Wave abnormalities: normal  Conduction Disutrbances:none  Narrative Interpretation:   Old EKG Reviewed: unchanged; no significant changes from previous EKG dated 09/10/2012.   Results for orders placed during the hospital encounter of 05/08/13  URINE RAPID DRUG SCREEN (HOSP PERFORMED)      Result Value Ref Range   Opiates POSITIVE (*) NONE DETECTED   Cocaine NONE DETECTED  NONE DETECTED   Benzodiazepines NONE DETECTED  NONE DETECTED   Amphetamines NONE DETECTED  NONE DETECTED   Tetrahydrocannabinol NONE DETECTED  NONE DETECTED   Barbiturates NONE DETECTED  NONE DETECTED  ETHANOL  Result Value Ref Range   Alcohol, Ethyl (B) <11  0 - 11 mg/dL  URINALYSIS, ROUTINE W REFLEX MICROSCOPIC      Result Value Ref Range   Color, Urine YELLOW  YELLOW   APPearance CLEAR  CLEAR   Specific Gravity, Urine 1.010  1.005 - 1.030   pH 6.0  5.0 - 8.0   Glucose, UA NEGATIVE  NEGATIVE mg/dL   Hgb urine dipstick NEGATIVE  NEGATIVE   Bilirubin Urine NEGATIVE  NEGATIVE   Ketones, ur NEGATIVE  NEGATIVE mg/dL   Protein, ur NEGATIVE  NEGATIVE mg/dL   Urobilinogen, UA 0.2  0.0 - 1.0 mg/dL   Nitrite NEGATIVE  NEGATIVE   Leukocytes, UA NEGATIVE  NEGATIVE  BASIC METABOLIC PANEL      Result Value Ref Range   Sodium 138  137 - 147 mEq/L   Potassium 3.9  3.7 - 5.3 mEq/L   Chloride 98  96 - 112 mEq/L   CO2 26  19 - 32 mEq/L   Glucose, Bld 105 (*) 70 - 99 mg/dL   BUN 8  6 - 23 mg/dL   Creatinine, Ser 1.61  0.50 - 1.35 mg/dL   Calcium 9.3  8.4 - 09.6 mg/dL   GFR calc non Af Amer >90  >90 mL/min   GFR calc Af Amer >90  >90 mL/min  CBC WITH DIFFERENTIAL      Result Value Ref Range   WBC 8.7  4.0 - 10.5 K/uL   RBC 4.56  4.22 - 5.81 MIL/uL   Hemoglobin 14.9  13.0 - 17.0 g/dL   HCT 04.5  40.9 - 81.1 %    MCV 94.7  78.0 - 100.0 fL   MCH 32.7  26.0 - 34.0 pg   MCHC 34.5  30.0 - 36.0 g/dL   RDW 91.4  78.2 - 95.6 %   Platelets 299  150 - 400 K/uL   Neutrophils Relative % 71  43 - 77 %   Neutro Abs 6.2  1.7 - 7.7 K/uL   Lymphocytes Relative 18  12 - 46 %   Lymphs Abs 1.5  0.7 - 4.0 K/uL   Monocytes Relative 9  3 - 12 %   Monocytes Absolute 0.8  0.1 - 1.0 K/uL   Eosinophils Relative 2  0 - 5 %   Eosinophils Absolute 0.2  0.0 - 0.7 K/uL   Basophils Relative 0  0 - 1 %   Basophils Absolute 0.0  0.0 - 0.1 K/uL  TROPONIN I      Result Value Ref Range   Troponin I <0.30  <0.30 ng/mL  D-DIMER, QUANTITATIVE      Result Value Ref Range   D-Dimer, Quant 0.54 (*) 0.00 - 0.48 ug/mL-FEU   Dg Chest 2 View 05/08/2013   CLINICAL DATA:  Syncope.  EXAM: CHEST  2 VIEW  COMPARISON:  DG CHEST 2 VIEW dated 04/16/2012  FINDINGS: The heart size and mediastinal contours are within normal limits. Both lungs are clear. The visualized skeletal structures are unremarkable.  IMPRESSION: No active cardiopulmonary disease.   Electronically Signed   By: Maisie Fus  Register   On: 05/08/2013 18:47   Ct Angio Chest Pe W/cm &/or Wo Cm 05/08/2013   CLINICAL DATA:  Elevated T time are, syncope, tachycardia  EXAM: CT ANGIOGRAPHY CHEST WITH CONTRAST  TECHNIQUE: Multidetector CT imaging of the chest was performed using the standard protocol during bolus administration of intravenous contrast. Multiplanar CT image reconstructions and MIPs were obtained to evaluate the  vascular anatomy.  CONTRAST:  OMNIPAQUE IOHEXOL 350 MG/ML SOLN  COMPARISON:  None.  FINDINGS: Images of the thoracic inlet are unremarkable. Central airways are patent.  Sagittal images of the spine are unremarkable. No mediastinal hematoma or adenopathy.  No pulmonary embolus is identified. Heart size within normal limits. No pericardial effusion.  Visualized upper abdomen is unremarkable.  Images of the lung parenchyma shows no acute infiltrate or pulmonary edema. No  pneumothorax. No focal consolidation. No destructive rib lesions are noted.  Review of the MIP images confirms the above findings.  IMPRESSION: 1. No pulmonary embolus. 2. No mediastinal hematoma or adenopathy. 3. No acute infiltrate or pulmonary edema.   Electronically Signed   By: Natasha Mead M.D.   On: 05/08/2013 19:33   Mr Brain Wo Contrast 05/08/2013   CLINICAL DATA:  Syncope and weakness.  EXAM: MRI HEAD WITHOUT CONTRAST  TECHNIQUE: Multiplanar, multiecho pulse sequences of the brain and surrounding structures were obtained without intravenous contrast.  COMPARISON:  Head CT 02/28/2009  FINDINGS: There is no evidence of acute infarct. Ventricles and sulci are normal. Major intracranial flow voids are normal. There is no evidence of intracranial hemorrhage, mass, midline shift, or extra-axial fluid collection. No brain parenchymal signal abnormality is identified. Orbits and visualized paranasal sinuses are unremarkable. There is a small to moderate left mastoid effusion. Calvarium and scalp soft tissues are unremarkable.  IMPRESSION: 1. Unremarkable appearance of the brain. 2. Left mastoid effusion.   Electronically Signed   By: Sebastian Ache   On: 05/08/2013 19:17     Laray Anger, DO 05/10/13 1559

## 2013-05-08 NOTE — Discharge Instructions (Signed)
°Emergency Department Resource Guide °1) Find a Doctor and Pay Out of Pocket °Although you won't have to find out who is covered by your insurance plan, it is a good idea to ask around and get recommendations. You will then need to call the office and see if the doctor you have chosen will accept you as a new patient and what types of options they offer for patients who are self-pay. Some doctors offer discounts or will set up payment plans for their patients who do not have insurance, but you will need to ask so you aren't surprised when you get to your appointment. ° °2) Contact Your Local Health Department °Not all health departments have doctors that can see patients for sick visits, but many do, so it is worth a call to see if yours does. If you don't know where your local health department is, you can check in your phone book. The CDC also has a tool to help you locate your state's health department, and many state websites also have listings of all of their local health departments. ° °3) Find a Walk-in Clinic °If your illness is not likely to be very severe or complicated, you may want to try a walk in clinic. These are popping up all over the country in pharmacies, drugstores, and shopping centers. They're usually staffed by nurse practitioners or physician assistants that have been trained to treat common illnesses and complaints. They're usually fairly quick and inexpensive. However, if you have serious medical issues or chronic medical problems, these are probably not your best option. ° °No Primary Care Doctor: °- Call [facility] at  832-8000 - they can help you locate a primary care doctor that  accepts your insurance, provides certain services, etc. °- Physician Referral Service- 1-800-533-3463 ° °Chronic Pain Problems: °Organization         Address  Phone   Notes  °[facility]  (336) 297-2271 Patients need to be referred by their primary care doctor.  ° °Medication  Assistance: °Organization         Address  Phone   Notes  °[place] [facility] [address] °[place], [place] [address] (336) 641-8030 --Must be a resident of [place] °-- Must have NO insurance coverage whatsoever (no Medicaid/ Medicare, etc.) °-- The pt. MUST have a primary care doctor that directs their care regularly and follows them in the community °  °[facility]  (866) 331-1348   °[employer]  (888) 892-1162   ° °Agencies that provide inexpensive medical care: °Organization         Address  Phone   Notes  °[facility]  (336) 832-8035   °[facility]    (336) 832-7272   °[facility] [address] °[place], [place] [address] (336) 832-4777   °[facility] [address], °[place] (336) 271-4999   °[facility]    (336) 373-0678   °[facility]    (336) 272-1050   °[facility] ° [address], [place] Phone:  (336) 832-4444, Fax:  (336) 832-4440 Hours of Operation:  9 am - 6 pm, M-F.  Also accepts Medicaid/Medicare and self-pay.  °[facility] ° [address], [place] Phone: (336) 832-3150, Fax: (336) [phone]. Hours of Operation:  8:30 am - 5:30 pm, M-F.  Also accepts Medicaid and self-pay.  °[facility] [place] [address], [place] Phone: (336) 878-6027   °[facility] [address], [place], [place] (336)723-1848, Ext. 123 Mondays & Thursdays: 7-9 AM.  First 15 patients are seen on a first come, first serve basis. °  ° °Medicaid-accepting [place] Providers: ° °Organization         Address  Phone   Notes  °[facility] [address], [place] (336) 641-2100 Also accepts self-pay patients.  °[facility] [address], [place] ° (336) 856-9996   °[facility] [address], [place]  (336) 288-8857   °[facility] [address] [place] [address], [place] (336) 299-7000   °Veita Bland [address], [place]  ° (336) 373-1557 Only accepts [place] Access Medicaid patients after they have their name applied to their card.  ° °Self-Pay (no insurance) in [place]: ° °Organization         Address  Phone   Notes  °Sickle Cell Patients, [facility] [address], [place] (336) 832-1970   °[facility] [address], [place] (336) 832-4400   °[facility] ° [address], [place] (336) 992-4800   °[facility]/Dr. Osei-Bonsu ° [address] [place] [address], [place] or [address], [place] (336) 841-8500 Phone number for both [place] and [place] locations is the same.  °[facility] [address], [place] (336) 299-0000   °[facility] [address] [place] [address], [place] or [address] (336) 852-7530 °(336) 878-2260   °[facility] [address], [place] (336) 350-1642, phone; (336) 294-5005, fax Sees patients 1st and 3rd Saturday of every month.  Must not qualify for public or private insurance (i.e. Medicaid, Medicare, [place] Health Choice, Veterans' Benefits) • Household income should be no more than 200% of the poverty level •The clinic cannot treat you if you are pregnant or think you are pregnant • Sexually transmitted diseases are not treated at the clinic.  ° ° °Dental Care: °Organization         Address  Phone  Notes  °[place] [facility] [address], [place] (336) 641-6152 Accepts children up to age 21 who are enrolled in Medicaid or [place] Health Choice; pregnant women with a Medicaid card; and children who have applied for Medicaid or [place] Health Choice, but were declined, whose parents can pay a reduced fee at time of service.  °[place]  [facility] [place]  [address], [place] (336) 641-7733 Accepts children up to age 21 who are enrolled in Medicaid or [place] Health Choice; pregnant women with a Medicaid card; and children who have applied for Medicaid or [place] Health Choice, but were declined, whose parents can pay a reduced fee at time of service.  °[facility] ° [address], [place] (336) 641-4533 Patients are seen by appointment only. Walk-ins are not accepted. [facility] will see patients 18 years of age and older. °Monday - Tuesday (8am-5pm) °Most Wednesdays (8:30-5pm) °$30 per visit, cash only  °[facility] ° [address], [place] (336) 641-4533 Patients are seen by appointment only. Walk-ins are not accepted. [facility] will see patients 18 years of age and older. °One   Wednesday Evening (Monthly: Volunteer Based).  $30 per visit, cash only  °[facility]  (919) 537-3737 for adults; Children under age 4, call Graduate Pediatric Dentistry at ([phone]) [phone]. Children aged 4-14, please call (919) [phone] to request a pediatric application. ° Dental services are provided in all areas of dental care including fillings, crowns and bridges, complete and partial dentures, implants, gum treatment, root canals, and extractions. Preventive care is also provided. Treatment is provided to both adults and children. °Patients are selected via a lottery and there is often a waiting list. °  °[facility] [address], °[place] ° (336) 763-8833 [email or web address] °  °[facility] [address], [place], [place] (336)723-1848, Ext. 123 Second and Fourth Thursday of each month, opens at 6:30 AM; Clinic ends at 9 AM.  Patients are seen on a first-come first-served basis, and a limited number are seen during each clinic.  ° °[facility] ° [address], [place], [place] (336) 723-7904    Eligibility Requirements °You must have lived in [place], [place], or [place] counties for at least the last three months. °  You cannot be eligible for state or federal sponsored healthcare insurance, including Veterans Administration, Medicaid, or Medicare. °  You generally cannot be eligible for healthcare insurance through your employer.  °  How to apply: °Eligibility screenings are held every Tuesday and Wednesday afternoon from 1:00 pm until 4:00 pm. You do not need an appointment for the interview!  °[facility] [address], [place], [place] 336-631-2330   °[facility]  336-342-8273   °[facility]  336-703-3100   °[facility]  336-570-6415   ° °Behavioral Health Resources in the Community: °Intensive Outpatient Programs °Organization         Address  Phone  Notes  °[facility] [address], [place], [place] 336-878-6098   °[facility] [address], [place], [place] 336-832-9800   °[facility] [address], [place], [place] ° 336-882-2125   °[facility] [address],  °[place], [place] 1-800-853-5163 or 336-641-4981   °Substance Abuse Resources °Organization         Address  Phone  Notes  °[facility]  336-882-2125   °[facility]  336-784-9470   °[facility]  336-285-9073   °[facility]  336-845-3988   °Residential & Outpatient Substance Abuse Program  1-800-659-3381   °Psychological Services °Organization         Address  Phone  Notes  °[facility]  336- 832-9600   °[facility]  336- 378-7881   °[facility] [address], [place] 1-800-853-5163 or 336-641-4981   ° °Mobile Crisis Teams °Organization         Address  Phone  Notes  °[facility], Mobile Crisis Care Unit  1-877-626-1772   °Assertive °[facility] ° [address]  [place], [place] 336-834-9664   °Sharon DeEsch [address] °[place] 336-554-5454   ° °Self-Help/Support Groups °Organization         Address  Phone             Notes  °[facility] of  - variety of support groups  336- 373-1402 Call for more information  °Narcotics Anonymous (NA), [facility] [address], °[place]  2 meetings at this location  ° °  Residential Treatment Programs Organization         Address  Phone  Notes  [facility] [address],    [place]  [phone]   [facility]  [address], [place] [phone], [place], [place] [phone]   [facility] [address], [place] [phone] Admissions: 8am-3pm M-F  [facility] [address]    [place], [place] [phone]   [facility] [address], [place], [place] [phone]   [facility] [address]  [place], [place] [phone]   [facility] Intensive Outpatient [address], [place], [place] [phone]   [facility] ([facility] [address]  [place], [place] [phone])[phone] or [phone]([phone])[phone]   [facility] [address] [place], [place] [phone] Accepts Medicaid  [facility] [address]  [place] [phone]) [phone] Substance Abuse/Addiction Treatment   [facility]         Address  Phone  Notes  [facility]  340-367-3396(888) 717-777-7086   Angie FavaJulie Brannon, PhD [address], [address] [place], [place]   [phone](336) [phone] or [phone](336) [phone]   [facility]   [address] [place], [place] [phone](336) [phone]   [facility] [address], [place], [place] [phone](336) (702) [phone] Insurance/Medicaid/sponsorship through [facility]  [facility] [address]                                    [place], [place] [phone](336) ([phone] Therapy/tele-psych/case    [facility] [address]   [place], [place] [phone](336) [phone]    Dr. Lolly MustacheArfeen  [phone]) [phone]   [facility] [place]  [employer] [facility] 1) [address], [place] 2) [address], [place] 3)  [address], [place] [phone](336) [phone]) [phone]  [phone](336) [phone]   [facility] Child Abuse Hotline (970)506-0847(336) 930-054-1286 or (623) 153-0800(336) [phone] (After Hours)       Take the new prescription as directed for anxiety.  Call your regular medical doctor tomorrow to schedule a follow up appointment within the next 3 days.  Return to the Emergency Department immediately sooner if worsening.

## 2013-05-08 NOTE — ED Notes (Signed)
Patient given discharge instruction, verbalized understand. IV removed, band aid applied. Patient ambulatory out of the department.  

## 2013-05-09 LAB — URINE CULTURE
COLONY COUNT: NO GROWTH
Culture: NO GROWTH

## 2013-07-03 DIAGNOSIS — Z9889 Other specified postprocedural states: Secondary | ICD-10-CM | POA: Insufficient documentation

## 2013-07-03 DIAGNOSIS — Z96643 Presence of artificial hip joint, bilateral: Secondary | ICD-10-CM | POA: Insufficient documentation

## 2013-08-20 ENCOUNTER — Encounter (HOSPITAL_COMMUNITY): Payer: Self-pay | Admitting: Emergency Medicine

## 2013-08-20 ENCOUNTER — Emergency Department (HOSPITAL_COMMUNITY)
Admission: EM | Admit: 2013-08-20 | Discharge: 2013-08-21 | Disposition: A | Discharge status: Home / Self Care | Payer: 59 | Attending: Emergency Medicine | Admitting: Emergency Medicine

## 2013-08-20 ENCOUNTER — Emergency Department (HOSPITAL_COMMUNITY): Payer: 59

## 2013-08-20 DIAGNOSIS — I10 Essential (primary) hypertension: Secondary | ICD-10-CM | POA: Insufficient documentation

## 2013-08-20 DIAGNOSIS — F17200 Nicotine dependence, unspecified, uncomplicated: Secondary | ICD-10-CM | POA: Insufficient documentation

## 2013-08-20 DIAGNOSIS — Z79899 Other long term (current) drug therapy: Secondary | ICD-10-CM | POA: Insufficient documentation

## 2013-08-20 DIAGNOSIS — N200 Calculus of kidney: Secondary | ICD-10-CM | POA: Insufficient documentation

## 2013-08-20 DIAGNOSIS — G8929 Other chronic pain: Secondary | ICD-10-CM | POA: Insufficient documentation

## 2013-08-20 DIAGNOSIS — R319 Hematuria, unspecified: Secondary | ICD-10-CM | POA: Insufficient documentation

## 2013-08-20 DIAGNOSIS — N390 Urinary tract infection, site not specified: Secondary | ICD-10-CM | POA: Insufficient documentation

## 2013-08-20 MED ORDER — HYDROMORPHONE HCL PF 1 MG/ML IJ SOLN
1.0000 mg | Freq: Once | INTRAMUSCULAR | Status: AC
  Administered 2013-08-20: 1 mg via INTRAVENOUS
  Filled 2013-08-20: qty 1

## 2013-08-20 MED ORDER — ONDANSETRON HCL 4 MG/2ML IJ SOLN
4.0000 mg | Freq: Once | INTRAMUSCULAR | Status: AC
  Administered 2013-08-20: 4 mg via INTRAVENOUS
  Filled 2013-08-20: qty 2

## 2013-08-20 MED ORDER — SODIUM CHLORIDE 0.9 % IV BOLUS (SEPSIS)
1000.0000 mL | Freq: Once | INTRAVENOUS | Status: AC
  Administered 2013-08-20: 1000 mL via INTRAVENOUS

## 2013-08-20 NOTE — ED Notes (Signed)
Kidney stone symptoms that started a couple of days ago, now it is down to the groin area and it has became unbearable per pt.

## 2013-08-20 NOTE — ED Notes (Signed)
Patient states that he is unable to urinate. 

## 2013-08-21 LAB — CBC WITH DIFFERENTIAL/PLATELET
Basophils Absolute: 0 10*3/uL (ref 0.0–0.1)
Basophils Relative: 1 % (ref 0–1)
EOS PCT: 3 % (ref 0–5)
Eosinophils Absolute: 0.2 10*3/uL (ref 0.0–0.7)
HCT: 42.3 % (ref 39.0–52.0)
HEMOGLOBIN: 14.5 g/dL (ref 13.0–17.0)
LYMPHS ABS: 2.5 10*3/uL (ref 0.7–4.0)
Lymphocytes Relative: 35 % (ref 12–46)
MCH: 31.6 pg (ref 26.0–34.0)
MCHC: 34.3 g/dL (ref 30.0–36.0)
MCV: 92.2 fL (ref 78.0–100.0)
MONOS PCT: 9 % (ref 3–12)
Monocytes Absolute: 0.6 10*3/uL (ref 0.1–1.0)
Neutro Abs: 3.7 10*3/uL (ref 1.7–7.7)
Neutrophils Relative %: 52 % (ref 43–77)
Platelets: 271 10*3/uL (ref 150–400)
RBC: 4.59 MIL/uL (ref 4.22–5.81)
RDW: 12.3 % (ref 11.5–15.5)
WBC: 7.1 10*3/uL (ref 4.0–10.5)

## 2013-08-21 LAB — URINALYSIS, ROUTINE W REFLEX MICROSCOPIC
GLUCOSE, UA: NEGATIVE mg/dL
Leukocytes, UA: NEGATIVE
Nitrite: NEGATIVE
Specific Gravity, Urine: 1.02 (ref 1.005–1.030)
Urobilinogen, UA: 1 mg/dL (ref 0.0–1.0)
pH: 6 (ref 5.0–8.0)

## 2013-08-21 LAB — BASIC METABOLIC PANEL
BUN: 8 mg/dL (ref 6–23)
CALCIUM: 8.6 mg/dL (ref 8.4–10.5)
CO2: 21 mEq/L (ref 19–32)
Chloride: 109 mEq/L (ref 96–112)
Creatinine, Ser: 0.86 mg/dL (ref 0.50–1.35)
GFR calc Af Amer: >90 mL/min (ref >90)
GFR calc non Af Amer: >90 mL/min (ref >90)
GLUCOSE: 102 mg/dL — AB (ref 70–99)
POTASSIUM: 3.8 meq/L (ref 3.7–5.3)
SODIUM: 143 meq/L (ref 137–147)

## 2013-08-21 LAB — URINE MICROSCOPIC-ADD ON

## 2013-08-21 MED ORDER — LEVOFLOXACIN 750 MG PO TABS: 750.0000 mg | ORAL_TABLET | Freq: Every day | ORAL | Status: DC

## 2013-08-21 MED ORDER — PHENAZOPYRIDINE HCL 200 MG PO TABS: 200.0000 mg | ORAL_TABLET | Freq: Three times a day (TID) | ORAL | Status: DC

## 2013-08-21 MED ORDER — OXYCODONE-ACETAMINOPHEN 5-325 MG PO TABS: 1.0000 | ORAL_TABLET | ORAL | Status: DC | PRN

## 2013-08-21 MED ORDER — LEVOFLOXACIN 750 MG PO TABS
750.0000 mg | ORAL_TABLET | Freq: Once | ORAL | Status: AC
  Administered 2013-08-21: 750 mg via ORAL
  Filled 2013-08-21: qty 1

## 2013-08-21 MED ORDER — PHENAZOPYRIDINE HCL 100 MG PO TABS
200.0000 mg | ORAL_TABLET | Freq: Once | ORAL | Status: AC
  Administered 2013-08-21: 200 mg via ORAL
  Filled 2013-08-21: qty 2

## 2013-08-21 MED ORDER — KETOROLAC TROMETHAMINE 30 MG/ML IJ SOLN
30.0000 mg | Freq: Once | INTRAMUSCULAR | Status: AC
  Administered 2013-08-21: 30 mg via INTRAVENOUS
  Filled 2013-08-21: qty 1

## 2013-08-21 NOTE — ED Provider Notes (Signed)
CSN: [id number]     Arrival date & time 08/20/13  2246 History   First MD Initiated Contact with Patient 08/20/13 2307     Chief Complaint  Patient presents with  . Nephrolithiasis     (Consider location/radiation/quality/duration/timing/severity/associated sxs/prior Treatment) HPI Comments: Jorge Nichols is a 23 y.o. Male with a strong prior history of kidney stones presenting with a  several day history of right sided flank pain which has progressed in location, now describing right lower groin and penile pain which is worsened with urination.  He reports hematuria and burning pain with urination with a frequent need to urinate, but does not feel he is emptying his bladder completely,  And having to strain to urinate. He denies fevers, chills, nausea or vomiting and denies penile discharge.  He has been followed by a urology in the past, most recently was seen by a urologist in [place], but would be interested in establishing care closer.       The history is provided by the patient.    Past Medical History  Diagnosis Date  . Hypertension   . Hyperlipidemia   . Kidney stones   . Chronic headaches   . Anxiety    Past Surgical History  Procedure Laterality Date  . Lithotripsy     Family History  Problem Relation Age of Onset  . Hypertension Father    History  Substance Use Topics  . Smoking status: Current Every Day Smoker -- 0.50 packs/day  . Smokeless tobacco: Not on file  . Alcohol Use: 3.0 oz/week    5 Cans of beer per week    Review of Systems  Constitutional: Negative for fever and chills.  HENT: Negative for congestion and sore throat.   Eyes: Negative.   Respiratory: Negative for chest tightness and shortness of breath.   Cardiovascular: Negative for chest pain.  Gastrointestinal: Negative for nausea and abdominal pain.  Genitourinary: Positive for dysuria, frequency, hematuria and flank pain. Negative for discharge.  Musculoskeletal: Negative for  arthralgias, joint swelling and neck pain.  Skin: Negative.  Negative for rash and wound.  Neurological: Negative for dizziness, weakness, light-headedness, numbness and headaches.  Psychiatric/Behavioral: Negative.       Allergies  Review of patient's allergies indicates no known allergies.  Home Medications   Prior to Admission medications   Medication Sig Start Date End Date Taking? Authorizing Provider  ALPRAZolam Prudy Feeler) 0.5 MG tablet Take 0.5 mg by mouth at bedtime as needed for anxiety.     Historical Provider, MD  HYDROcodone-acetaminophen [facility]) 5-325 MG per tablet 1 or 2 po q4h prn pain 01/29/13   Kathie Dike, PA-C  levofloxacin (LEVAQUIN) 750 MG tablet Take 1 tablet (750 mg total) by mouth daily. 08/21/13   Burgess Amor, PA-C  LORazepam (ATIVAN) 1 MG tablet One half to one tablet PO BID prn anxiety 05/08/13   Laray Anger, DO  metoprolol (LOPRESSOR) 50 MG tablet Take 50 mg by mouth 2 (two) times daily.    Historical Provider, MD  oxyCODONE-acetaminophen (PERCOCET/ROXICET) 5-325 MG per tablet Take 1 tablet by mouth every 4 (four) hours as needed for severe pain. 08/21/13   Burgess Amor, PA-C  phenazopyridine (PYRIDIUM) 200 MG tablet Take 1 tablet (200 mg total) by mouth 3 (three) times daily. 08/21/13   Burgess Amor, PA-C  promethazine (PHENERGAN) 25 MG tablet Take 1 tablet (25 mg total) by mouth every 6 (six) hours as needed for nausea. 05/14/11 05/21/11  Benny Lennert, MD  BP 118/77  Pulse 99  Temp(Src) 98.5 F (36.9 C) (Oral)  Resp 20  Ht 5\' 7"  (1.702 m)  Wt 240 lb (108.863 kg)  BMI 37.58 kg/m2  SpO2 99% Physical Exam  Nursing note and vitals reviewed. Constitutional: He appears well-developed and well-nourished.  HENT:  Head: Normocephalic and atraumatic.  Eyes: Conjunctivae are normal.  Neck: Normal range of motion.  Cardiovascular: Normal rate, regular rhythm, normal heart sounds and intact distal pulses.   Pulmonary/Chest: Effort normal and breath sounds  normal. He has no wheezes.  Abdominal: Soft. Bowel sounds are normal. He exhibits no mass. There is no tenderness. There is no guarding.  No cva tenderness. No reproducible abdominal pain.  Musculoskeletal: Normal range of motion.  Neurological: He is alert.  Skin: Skin is warm and dry.  Psychiatric: He has a normal mood and affect.    ED Course  Procedures (including critical care time) Labs Review Labs Reviewed  URINALYSIS, ROUTINE W REFLEX MICROSCOPIC - Abnormal; Notable for the following:    Color, Urine RED (*)    Hgb urine dipstick LARGE (*)    Bilirubin Urine SMALL (*)    Ketones, ur TRACE (*)    Protein, ur TRACE (*)    All other components within normal limits  BASIC METABOLIC PANEL - Abnormal; Notable for the following:    Glucose, Bld 102 (*)    All other components within normal limits  URINE MICROSCOPIC-ADD ON - Abnormal; Notable for the following:    Bacteria, UA MANY (*)    All other components within normal limits  URINE CULTURE  CBC WITH DIFFERENTIAL    Imaging Review Ct Abdomen Pelvis Wo Contrast  08/21/2013   CLINICAL DATA:  Right flank and groin pain.  EXAM: CT ABDOMEN AND PELVIS WITHOUT CONTRAST  TECHNIQUE: Multidetector CT imaging of the abdomen and pelvis was performed following the standard protocol without IV contrast.  COMPARISON:  08/08/2011  FINDINGS: BODY WALL: Unremarkable.  LOWER CHEST: Unremarkable.  ABDOMEN/PELVIS:  Liver: No focal abnormality.  Biliary: No evidence of biliary obstruction or stone.  Pancreas: Unremarkable.  Spleen: Unchanged lobulation of the inferior spleen.  Adrenals: Unremarkable.  Kidneys and ureters: Multiple, at least 8, right renal calculi measuring up to 4 mm. There are multiple left renal calculi, although less extensive than on the right. No ureteral stone or hydronephrosis. No asymmetric renal enlargement.  Bladder: Decompressed, limiting evaluation.  Reproductive: Unremarkable.  Bowel: No obstruction. Normal appendix.   Retroperitoneum: No mass or adenopathy.  Peritoneum: No free fluid or gas.  Vascular: No acute abnormality.  OSSEOUS: Transitional lumbosacral anatomy with rudimentary disc at the L5-S1. Total left hip arthroplasty. No adverse features. Right femoral head sclerosis with chronic appearing transverse femoral head fracture and collapse.  IMPRESSION: 1. Bilateral nephrolithiasis. No hydronephrosis or ureteral calculus. 2. Right femoral head osteonecrosis with chronic fracture and collapse.   Electronically Signed   By: Tiburcio Pea M.D.   On: 08/21/2013 00:25     EKG Interpretation None      MDM   Final diagnoses:  Renal stones  UTI (lower urinary tract infection)    Urine sample obtained during this visit contained an approximate 3 mm kidney stone.  UA with bacteria. Will cover with levaquin, first dose given here.  He was also prescribed pyridium and percocet.  [facility]erral to [facility] to establish urology care here.  In the interim,  Encouraged return here for fevers, vomiting or worsened pain.  Urine cx ordered.  Burgess AmorJulie Dorwin Fitzhenry, PA-C 08/21/13 [id number]

## 2013-08-21 NOTE — Discharge Instructions (Signed)
Bacteremia Bacteremia occurs when bacteria get in your blood. Normal blood does not usually have bacteria. Bacteremia is one way infections can spread from one part of the body to another. CAUSES   Causes may include anything that allows bacteria to get into the body. Examples are:  Catheters.  Intravenous (IV) access tubes.  Cuts or scrapes of the skin.  Temporary bacteremia may occur during dental procedures, while brushing your teeth, or during a bowel movement. This rarely causes any symptoms or medical problems.  Bacteria may also get in the bloodstream as a complication of a bacterial infection elsewhere. This includes infected wounds and bacterial infections of the:  Lungs (pneumonia).  Kidneys (pyelonephritis).  Intestines (enteritis, colitis).  Organs in the abdomen (appendicitis, cholecystitis, diverticulitis). SYMPTOMS  The body is usually able to clear small numbers of bacteria out of the blood quickly. Brief bacteremia usually does not cause problems.   Problems can occur if the bacteria start to grow in number or spread to other parts of the body. If the bacteria start growing, you may develop:  Chills.  Fever.  Nausea.  Vomiting.  Sweating.  Lightheadedness and low blood pressure.  Pain.  If bacteria start to grow in the linings around the brain, it is called meningitis. This can cause severe headaches, many other problems, and even death.  If bacteria start to grow in a joint, it causes arthritis with painful joints. If bacteria start to grow in a bone, it is called osteomyelitis.  Bacteria from the blood can also cause sores (abscesses) in many organs, such as the muscle, liver, spleen, lungs, brain, and kidneys. DIAGNOSIS   This condition is diagnosed by cultures of the blood.  Cultures may also be taken from other parts of the body that are thought to be causing the bacteremia. A small piece of tissue, fluid, or other product of the body is  sampled. The sample is then put on a growth plate to see if any bacteria grows.  Other lab tests may be done and the results may be abnormal. TREATMENT  Treatment requires a stay in the hospital. You will be given antibiotic medicine through an IV access tube. PREVENTION  People with an increased risk of developing bacteremia or complications may be given antibiotics before certain procedures. Examples are:  A person with a heart murmur or artificial heart valve, before having his or her teeth cleaned.  Before having a surgical or other invasive procedure.  Before having a bowel procedure. Document Released: 12/21/2005 Document Revised: 06/01/2011 Document Reviewed: 10/02/2010 [facility] Patient Information 2014 [place], [place].  Kidney Stones Kidney stones (urolithiasis) are deposits that form inside your kidneys. The intense pain is caused by the stone moving through the urinary tract. When the stone moves, the ureter goes into spasm around the stone. The stone is usually passed in the urine.  CAUSES   A disorder that makes certain neck glands produce too much parathyroid hormone (primary hyperparathyroidism).  A buildup of uric acid crystals, similar to gout in your joints.  Narrowing (stricture) of the ureter.  A kidney obstruction present at birth (congenital obstruction).  Previous surgery on the kidney or ureters.  Numerous kidney infections. SYMPTOMS   Feeling sick to your stomach (nauseous).  Throwing up (vomiting).  Blood in the urine (hematuria).  Pain that usually spreads (radiates) to the groin.  Frequency or urgency of urination. DIAGNOSIS   Taking a history and physical exam.  Blood or urine tests.  CT scan.  Occasionally,  an examination of the inside of the urinary bladder (cystoscopy) is performed. TREATMENT   Observation.  Increasing your fluid intake.  Extracorporeal shock wave lithotripsy This is a noninvasive procedure that uses shock waves  to break up kidney stones.  Surgery may be needed if you have severe pain or persistent obstruction. There are various surgical procedures. Most of the procedures are performed with the use of small instruments. Only small incisions are needed to accommodate these instruments, so recovery time is minimized. The size, location, and chemical composition are all important variables that will determine the proper choice of action for you. Talk to your health care provider to better understand your situation so that you will minimize the risk of injury to yourself and your kidney.  HOME CARE INSTRUCTIONS   Drink enough water and fluids to keep your urine clear or pale yellow. This will help you to pass the stone or stone fragments.  Strain all urine through the provided strainer. Keep all particulate matter and stones for your health care provider to see. The stone causing the pain may be as small as a grain of salt. It is very important to use the strainer each and every time you pass your urine. The collection of your stone will allow your health care provider to analyze it and verify that a stone has actually passed. The stone analysis will often identify what you can do to reduce the incidence of recurrences.  Only take over-the-counter or prescription medicines for pain, discomfort, or fever as directed by your health care provider.  Make a follow-up appointment with your health care provider as directed.  Get follow-up X-rays if required. The absence of pain does not always mean that the stone has passed. It may have only stopped moving. If the urine remains completely obstructed, it can cause loss of kidney function or even complete destruction of the kidney. It is your responsibility to make sure X-rays and follow-ups are completed. Ultrasounds of the kidney can show blockages and the status of the kidney. Ultrasounds are not associated with any radiation and can be performed easily in a matter of  minutes. SEEK MEDICAL CARE IF:  You experience pain that is progressive and unresponsive to any pain medicine you have been prescribed. SEEK IMMEDIATE MEDICAL CARE IF:   Pain cannot be controlled with the prescribed medicine.  You have a fever or shaking chills.  The severity or intensity of pain increases over 18 hours and is not relieved by pain medicine.  You develop a new onset of abdominal pain.  You feel faint or pass out.  You are unable to urinate. MAKE SURE YOU:   Understand these instructions.  Will watch your condition.  Will get help right away if you are not doing well or get worse. Document Released: 03/09/2005 Document Revised: 11/09/2012 Document Reviewed: 08/10/2012 [facility] Patient Information 2014 [place], [place].   Take your entire course of the antibiotics, taking the next dose Monday night (tonight).  Make sure you are drinking plenty of fluids.  Return here if you develop worsened pain,  Fever or vomiting.

## 2013-08-21 NOTE — ED Provider Notes (Signed)
Medical screening examination/treatment/procedure(s) were performed by non-physician practitioner and as supervising physician I was immediately available for consultation/collaboration.    Evelean Bigler D Alexis Mizuno, MD 08/21/13 0654 

## 2013-08-22 LAB — URINE CULTURE
CULTURE: NO GROWTH
Colony Count: NO GROWTH

## 2013-09-02 ENCOUNTER — Emergency Department: Payer: Self-pay | Admitting: Emergency Medicine

## 2013-09-21 ENCOUNTER — Ambulatory Visit (INDEPENDENT_AMBULATORY_CARE_PROVIDER_SITE_OTHER): Payer: 59 | Admitting: Otolaryngology

## 2013-10-05 ENCOUNTER — Other Ambulatory Visit: Payer: Self-pay

## 2013-10-05 ENCOUNTER — Emergency Department (HOSPITAL_COMMUNITY): Payer: 59

## 2013-10-05 ENCOUNTER — Emergency Department (HOSPITAL_COMMUNITY)
Admission: EM | Admit: 2013-10-05 | Discharge: 2013-10-06 | Disposition: A | Discharge status: Home / Self Care | Payer: 59 | Attending: Emergency Medicine | Admitting: Emergency Medicine

## 2013-10-05 ENCOUNTER — Encounter (HOSPITAL_COMMUNITY): Payer: Self-pay | Admitting: Emergency Medicine

## 2013-10-05 DIAGNOSIS — R0602 Shortness of breath: Secondary | ICD-10-CM | POA: Insufficient documentation

## 2013-10-05 DIAGNOSIS — R002 Palpitations: Secondary | ICD-10-CM

## 2013-10-05 DIAGNOSIS — Z79899 Other long term (current) drug therapy: Secondary | ICD-10-CM | POA: Insufficient documentation

## 2013-10-05 DIAGNOSIS — R42 Dizziness and giddiness: Secondary | ICD-10-CM | POA: Insufficient documentation

## 2013-10-05 DIAGNOSIS — Z8639 Personal history of other endocrine, nutritional and metabolic disease: Secondary | ICD-10-CM | POA: Insufficient documentation

## 2013-10-05 DIAGNOSIS — F411 Generalized anxiety disorder: Secondary | ICD-10-CM | POA: Insufficient documentation

## 2013-10-05 DIAGNOSIS — Z862 Personal history of diseases of the blood and blood-forming organs and certain disorders involving the immune mechanism: Secondary | ICD-10-CM | POA: Insufficient documentation

## 2013-10-05 DIAGNOSIS — R000 Tachycardia, unspecified: Secondary | ICD-10-CM | POA: Insufficient documentation

## 2013-10-05 DIAGNOSIS — G8929 Other chronic pain: Secondary | ICD-10-CM | POA: Insufficient documentation

## 2013-10-05 DIAGNOSIS — Z87442 Personal history of urinary calculi: Secondary | ICD-10-CM | POA: Insufficient documentation

## 2013-10-05 DIAGNOSIS — F17200 Nicotine dependence, unspecified, uncomplicated: Secondary | ICD-10-CM | POA: Insufficient documentation

## 2013-10-05 DIAGNOSIS — I10 Essential (primary) hypertension: Secondary | ICD-10-CM | POA: Insufficient documentation

## 2013-10-05 LAB — BASIC METABOLIC PANEL
Anion gap: 16 — ABNORMAL HIGH (ref 5–15)
BUN: 12 mg/dL (ref 6–23)
CALCIUM: 9.2 mg/dL (ref 8.4–10.5)
CO2: 25 mEq/L (ref 19–32)
CREATININE: 1.28 mg/dL (ref 0.50–1.35)
Chloride: 97 mEq/L (ref 96–112)
GFR calc Af Amer: 89 mL/min — ABNORMAL LOW (ref >90)
GFR, EST NON AFRICAN AMERICAN: 77 mL/min — AB (ref >90)
GLUCOSE: 124 mg/dL — AB (ref 70–99)
Potassium: 3.1 mEq/L — ABNORMAL LOW (ref 3.7–5.3)
Sodium: 138 mEq/L (ref 137–147)

## 2013-10-05 MED ORDER — ALPRAZOLAM 0.5 MG PO TABS
1.0000 mg | ORAL_TABLET | Freq: Once | ORAL | Status: AC
  Administered 2013-10-05: 1 mg via ORAL
  Filled 2013-10-05: qty 2

## 2013-10-05 MED ORDER — METOPROLOL TARTRATE 1 MG/ML IV SOLN
5.0000 mg | Freq: Once | INTRAVENOUS | Status: DC
  Filled 2013-10-05: qty 5

## 2013-10-05 MED ORDER — METOPROLOL TARTRATE 50 MG PO TABS
50.0000 mg | ORAL_TABLET | Freq: Once | ORAL | Status: AC
Start: 2013-10-05 — End: 2013-10-05
  Administered 2013-10-05: 50 mg via ORAL
  Filled 2013-10-05: qty 1

## 2013-10-05 NOTE — ED Provider Notes (Signed)
CSN: [id number]     Arrival date & time 10/05/13  2212 History   First MD Initiated Contact with Patient 10/05/13 2243     Chief Complaint  Patient presents with  . Chest Pain     (Consider location/radiation/quality/duration/timing/severity/associated sxs/prior Treatment) The history is provided by the patient.    Jorge Nichols is a 23 y.o. male presenting with palpitations,  Elevated blood pressure, chest pain and lightheadedness which started within an hour before arrival here tonight.  He was playing with his children when he felt his heart racing, which escalated into left upper chest pain along with shortness of breath and feelings of lightheadedness.  His symptoms lasted about 5 minutes, then improved.  This occurred twice prior to arrival here.  He has a history of htn for which he takes metoprolol qhs (although pcp has prescribed it bid) and had not yet taken this evenings dose.  He does endorse drinking 1/2 liter of Dr Reino Kent tonight which is twice his normal consumption. He denies any otc medicines including cold or decongestant medicines.  His blood pressure when taken at home was 180/140, but states the cuff if old and may be wrong.  He is 4 months out from having a left hip joint replacement due to avascular necrosis and had the pre op workup yesterday in anticipation of having the right hip replacement in about 2 weeks.  He denies lower extremity pain or swelling.  He describes having a similar episode of symptoms in February which was felt to be from stress.         Past Medical History  Diagnosis Date  . Hypertension   . Hyperlipidemia   . Kidney stones   . Chronic headaches   . Anxiety    Past Surgical History  Procedure Laterality Date  . Lithotripsy    . Joint replacement     Family History  Problem Relation Age of Onset  . Hypertension Father    History  Substance Use Topics  . Smoking status: Current Every Day Smoker -- 0.50 packs/day  . Smokeless tobacco:  Not on file  . Alcohol Use: 3.0 oz/week    5 Cans of beer per week    Review of Systems  HENT: Negative for congestion and sore throat.   Eyes: Negative.   Respiratory: Positive for chest tightness and shortness of breath.   Cardiovascular: Positive for palpitations.  Genitourinary: Negative.   Musculoskeletal: Negative for arthralgias, joint swelling and neck pain.  Skin: Negative.  Negative for rash and wound.  Neurological: Positive for light-headedness.  Psychiatric/Behavioral: Negative.       Allergies  Review of patient's allergies indicates no known allergies.  Home Medications   Prior to Admission medications   Medication Sig Start Date End Date Taking? Authorizing Provider  ALPRAZolam Prudy Feeler) 0.5 MG tablet Take 0.5 mg by mouth at bedtime as needed for anxiety.    Yes Historical Provider, MD  metoprolol (LOPRESSOR) 50 MG tablet Take 50 mg by mouth 2 (two) times daily.   Yes Historical Provider, MD  oxycodone (OXY-IR) 5 MG capsule Take 5-15 mg by mouth every 4 (four) hours as needed for pain.    Yes Historical Provider, MD  oxyCODONE-acetaminophen (PERCOCET/ROXICET) 5-325 MG per tablet Take 1 tablet by mouth every 4 (four) hours as needed for severe pain. 08/21/13  Yes Raynelle Fanning Tyrice Hewitt, PA-C   BP 123/72  Pulse 82  Temp(Src) 98.7 F (37.1 C) (Oral)  Resp 18  Ht 5\' 7"  (1.702  m)  Wt 230 lb (104.327 kg)  BMI 36.01 kg/m2  SpO2 100% Physical Exam  Nursing note and vitals reviewed. Constitutional: He appears well-developed and well-nourished.  HENT:  Head: Normocephalic and atraumatic.  Eyes: Conjunctivae are normal.  Neck: Normal range of motion.  Cardiovascular: Regular rhythm, normal heart sounds and intact distal pulses.  Tachycardia present.   Rate 130-145 during exam,symptomatic,  lasting about 2 minutes,  Then dropped to 110-115, asymptomatic at this rate.  Pulmonary/Chest: Effort normal and breath sounds normal. He has no wheezes. He exhibits no tenderness.   Abdominal: Soft. Bowel sounds are normal. There is no tenderness.  Musculoskeletal: Normal range of motion. He exhibits no edema and no tenderness.  No calf tenderness,  Dorsalis pedal pulses normal and equal.  Negative homan's.  Neurological: He is alert.  Skin: Skin is warm and dry.  Psychiatric: He has a normal mood and affect.    ED Course  Procedures (including critical care time) Labs Review Labs Reviewed  BASIC METABOLIC PANEL - Abnormal; Notable for the following:    Potassium 3.1 (*)    Glucose, Bld 124 (*)    GFR calc non Af Amer 77 (*)    GFR calc Af Amer 89 (*)    Anion gap 16 (*)    All other components within normal limits  D-DIMER, QUANTITATIVE - Abnormal; Notable for the following:    D-Dimer, Quant 0.50 (*)    All other components within normal limits  TROPONIN I    Imaging Review Dg Chest 2 View  10/05/2013   CLINICAL DATA:  Chest pain and rapid heart rate  EXAM: CHEST  2 VIEW  COMPARISON:  05/08/2013  FINDINGS: Normal heart size and mediastinal contours. No acute infiltrate or edema. No effusion or pneumothorax. No acute osseous findings.  IMPRESSION: No active cardiopulmonary disease.   Electronically Signed   By: Tiburcio PeaJonathan  Watts M.D.   On: 10/05/2013 22:52     EKG Interpretation None      Date: 10/05/2013  Rate110 Rhythm: sinus tachycardia  QRS Axis: normal  Intervals: normal  ST/T Wave abnormalities: normal  Conduction Disutrbances:none  Narrative Interpretation:   Old EKG Reviewed: none available    MDM   Final diagnoses:  Intermittent palpitations    Patients labs and/or radiological studies were viewed and considered during the medical decision making and disposition process. Pt was given xanax 1 mg PO,  Also given his evening metoprolol dose.  He was symptom free after receiving these medicines.  He was advised to f/u with his pcp this week for a recheck of symptoms.  Advised to do random check of bp's and share these with his pcp for  further guidance regarding his metoprolol dosing (denies this is prescribed for control of tachycardia).  D dimer is essentially normal. Wells criteria low risk for PE.       Burgess AmorJulie Avis Tirone, PA-C 10/06/13 0205

## 2013-10-05 NOTE — ED Notes (Signed)
My blood pressure shot up and my heart was racing, started having chest pain. Patient states that he was not upset over anything that he was playing with his kids.

## 2013-10-06 LAB — D-DIMER, QUANTITATIVE: D-Dimer, Quant: 0.5 ug/mL-FEU — ABNORMAL HIGH (ref 0.00–0.48)

## 2013-10-06 LAB — TROPONIN I: Troponin I: <0.3 ng/mL (ref <0.30)

## 2013-10-06 MED ORDER — POTASSIUM CHLORIDE CRYS ER 20 MEQ PO TBCR
40.0000 meq | EXTENDED_RELEASE_TABLET | Freq: Once | ORAL | Status: AC
  Administered 2013-10-06: 40 meq via ORAL
  Filled 2013-10-06: qty 2

## 2013-10-06 NOTE — Discharge Instructions (Signed)

## 2013-10-06 NOTE — ED Provider Notes (Signed)
Medical screening examination/treatment/procedure(s) were performed by non-physician practitioner and as supervising physician I was immediately available for consultation/collaboration.    Vida RollerBrian D Terisa Belardo, MD 10/06/13 [phone]

## 2013-10-24 ENCOUNTER — Emergency Department: Payer: Self-pay | Admitting: Emergency Medicine

## 2013-12-04 DIAGNOSIS — M25559 Pain in unspecified hip: Secondary | ICD-10-CM | POA: Insufficient documentation

## 2013-12-04 DIAGNOSIS — M7070 Other bursitis of hip, unspecified hip: Secondary | ICD-10-CM | POA: Insufficient documentation

## 2013-12-11 ENCOUNTER — Emergency Department (HOSPITAL_COMMUNITY): Payer: 59

## 2013-12-11 ENCOUNTER — Encounter (HOSPITAL_COMMUNITY): Payer: Self-pay | Admitting: Emergency Medicine

## 2013-12-11 ENCOUNTER — Emergency Department (HOSPITAL_COMMUNITY)
Admission: EM | Admit: 2013-12-11 | Discharge: 2013-12-11 | Disposition: A | Discharge status: Home / Self Care | Payer: 59 | Attending: Emergency Medicine | Admitting: Emergency Medicine

## 2013-12-11 DIAGNOSIS — S0180XA Unspecified open wound of other part of head, initial encounter: Secondary | ICD-10-CM | POA: Diagnosis not present

## 2013-12-11 DIAGNOSIS — Y92410 Unspecified street and highway as the place of occurrence of the external cause: Secondary | ICD-10-CM | POA: Insufficient documentation

## 2013-12-11 DIAGNOSIS — Z87442 Personal history of urinary calculi: Secondary | ICD-10-CM | POA: Insufficient documentation

## 2013-12-11 DIAGNOSIS — F17200 Nicotine dependence, unspecified, uncomplicated: Secondary | ICD-10-CM | POA: Insufficient documentation

## 2013-12-11 DIAGNOSIS — Y9389 Activity, other specified: Secondary | ICD-10-CM | POA: Diagnosis not present

## 2013-12-11 DIAGNOSIS — F411 Generalized anxiety disorder: Secondary | ICD-10-CM | POA: Insufficient documentation

## 2013-12-11 DIAGNOSIS — G8929 Other chronic pain: Secondary | ICD-10-CM | POA: Diagnosis not present

## 2013-12-11 DIAGNOSIS — Z96649 Presence of unspecified artificial hip joint: Secondary | ICD-10-CM | POA: Insufficient documentation

## 2013-12-11 DIAGNOSIS — Z79899 Other long term (current) drug therapy: Secondary | ICD-10-CM | POA: Diagnosis not present

## 2013-12-11 DIAGNOSIS — S3981XA Other specified injuries of abdomen, initial encounter: Secondary | ICD-10-CM | POA: Insufficient documentation

## 2013-12-11 DIAGNOSIS — Z862 Personal history of diseases of the blood and blood-forming organs and certain disorders involving the immune mechanism: Secondary | ICD-10-CM | POA: Insufficient documentation

## 2013-12-11 DIAGNOSIS — IMO0002 Reserved for concepts with insufficient information to code with codable children: Secondary | ICD-10-CM | POA: Diagnosis not present

## 2013-12-11 DIAGNOSIS — Z8639 Personal history of other endocrine, nutritional and metabolic disease: Secondary | ICD-10-CM | POA: Insufficient documentation

## 2013-12-11 DIAGNOSIS — I10 Essential (primary) hypertension: Secondary | ICD-10-CM | POA: Diagnosis not present

## 2013-12-11 DIAGNOSIS — S0990XA Unspecified injury of head, initial encounter: Secondary | ICD-10-CM | POA: Insufficient documentation

## 2013-12-11 LAB — COMPREHENSIVE METABOLIC PANEL
ALK PHOS: 89 U/L (ref 39–117)
ALT: 28 U/L (ref 0–53)
ANION GAP: 14 (ref 5–15)
AST: 29 U/L (ref 0–37)
Albumin: 4.3 g/dL (ref 3.5–5.2)
BILIRUBIN TOTAL: 0.6 mg/dL (ref 0.3–1.2)
BUN: 13 mg/dL (ref 6–23)
CHLORIDE: 100 meq/L (ref 96–112)
CO2: 26 mEq/L (ref 19–32)
Calcium: 9.9 mg/dL (ref 8.4–10.5)
Creatinine, Ser: 1.13 mg/dL (ref 0.50–1.35)
GFR calc non Af Amer: 89 mL/min — ABNORMAL LOW (ref >90)
GLUCOSE: 102 mg/dL — AB (ref 70–99)
Potassium: 3.9 mEq/L (ref 3.7–5.3)
Sodium: 140 mEq/L (ref 137–147)
TOTAL PROTEIN: 7.8 g/dL (ref 6.0–8.3)

## 2013-12-11 LAB — CBC WITH DIFFERENTIAL/PLATELET
BASOS PCT: 0 % (ref 0–1)
Basophils Absolute: 0 10*3/uL (ref 0.0–0.1)
EOS ABS: 0.1 10*3/uL (ref 0.0–0.7)
Eosinophils Relative: 1 % (ref 0–5)
HCT: 47 % (ref 39.0–52.0)
Hemoglobin: 16.5 g/dL (ref 13.0–17.0)
Lymphocytes Relative: 17 % (ref 12–46)
Lymphs Abs: 2 10*3/uL (ref 0.7–4.0)
MCH: 33.4 pg (ref 26.0–34.0)
MCHC: 35.1 g/dL (ref 30.0–36.0)
MCV: 95.1 fL (ref 78.0–100.0)
MONOS PCT: 7 % (ref 3–12)
Monocytes Absolute: 0.8 10*3/uL (ref 0.1–1.0)
Neutro Abs: 8.9 10*3/uL — ABNORMAL HIGH (ref 1.7–7.7)
Neutrophils Relative %: 75 % (ref 43–77)
Platelets: 299 10*3/uL (ref 150–400)
RBC: 4.94 MIL/uL (ref 4.22–5.81)
RDW: 13.5 % (ref 11.5–15.5)
WBC: 11.8 10*3/uL — ABNORMAL HIGH (ref 4.0–10.5)

## 2013-12-11 MED ORDER — HYDROMORPHONE HCL 1 MG/ML IJ SOLN
0.5000 mg | Freq: Once | INTRAMUSCULAR | Status: AC
  Administered 2013-12-11: 0.5 mg via INTRAVENOUS
  Filled 2013-12-11: qty 1

## 2013-12-11 MED ORDER — IOHEXOL 300 MG/ML  SOLN
100.0000 mL | Freq: Once | INTRAMUSCULAR | Status: AC | PRN
  Administered 2013-12-11: 100 mL via INTRAVENOUS

## 2013-12-11 MED ORDER — ONDANSETRON HCL 4 MG/2ML IJ SOLN
4.0000 mg | Freq: Once | INTRAMUSCULAR | Status: AC
  Administered 2013-12-11: 4 mg via INTRAVENOUS

## 2013-12-11 MED ORDER — KETOROLAC TROMETHAMINE 30 MG/ML IJ SOLN
30.0000 mg | Freq: Once | INTRAMUSCULAR | Status: AC
  Administered 2013-12-11: 30 mg via INTRAVENOUS
  Filled 2013-12-11: qty 1

## 2013-12-11 MED ORDER — ONDANSETRON HCL 4 MG/2ML IJ SOLN
INTRAMUSCULAR | Status: AC
  Filled 2013-12-11: qty 2

## 2013-12-11 MED ORDER — HYDROCODONE-ACETAMINOPHEN 5-325 MG PO TABS: 2.0000 | ORAL_TABLET | ORAL | Status: DC | PRN

## 2013-12-11 MED ORDER — IBUPROFEN 800 MG PO TABS: 800.0000 mg | ORAL_TABLET | Freq: Three times a day (TID) | ORAL | Status: DC | PRN

## 2013-12-11 MED ORDER — HYDROCODONE-ACETAMINOPHEN 5-325 MG PO TABS: ORAL_TABLET | ORAL | Status: DC

## 2013-12-11 NOTE — ED Notes (Signed)
Pt removed c-collar by self due to discomfort.  Repositioned for pt comfort.  Pt c/o worsening pain to back and ribs with deep breath.  Pt still not alert to place, even after being reoriented but alert to self and time.
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
## 2013-12-11 NOTE — ED Notes (Signed)
Pt requesting something for pain.  edp notified.  

## 2013-12-11 NOTE — Discharge Instructions (Signed)
Follow up with your md in 2-3 days.  No driving

## 2013-12-11 NOTE — ED Notes (Signed)
Pt c/o severe nausea.  meds given .

## 2013-12-11 NOTE — ED Notes (Signed)
Abrasions to head clean and dried and dressing applied.  Abrasion to left index finger cleaned and dried and bandage applied.
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
## 2013-12-28 ENCOUNTER — Emergency Department: Payer: Self-pay | Admitting: Emergency Medicine

## 2013-12-29 LAB — CBC WITH DIFFERENTIAL/PLATELET
Basophil #: 0.1 10*3/uL (ref 0.0–0.1)
Basophil %: 1.3 %
EOS PCT: 4.1 %
Eosinophil #: 0.4 10*3/uL (ref 0.0–0.7)
HCT: 49.6 % (ref 40.0–52.0)
HGB: 16.6 g/dL (ref 13.0–18.0)
Lymphocyte #: 3.6 10*3/uL (ref 1.0–3.6)
Lymphocyte %: 33.3 %
MCH: 32.7 pg (ref 26.0–34.0)
MCHC: 33.4 g/dL (ref 32.0–36.0)
MCV: 98 fL (ref 80–100)
Monocyte #: 1 x10 3/mm (ref 0.2–1.0)
Monocyte %: 9 %
NEUTROS PCT: 52.3 %
Neutrophil #: 5.7 10*3/uL (ref 1.4–6.5)
PLATELETS: 448 10*3/uL — AB (ref 150–440)
RBC: 5.07 10*6/uL (ref 4.40–5.90)
RDW: 14.1 % (ref 11.5–14.5)
WBC: 10.8 10*3/uL — ABNORMAL HIGH (ref 3.8–10.6)

## 2013-12-29 LAB — COMPREHENSIVE METABOLIC PANEL
ALBUMIN: 3.8 g/dL (ref 3.4–5.0)
ANION GAP: 10 (ref 7–16)
AST: 40 U/L — AB (ref 15–37)
Alkaline Phosphatase: 158 U/L — ABNORMAL HIGH
BUN: 5 mg/dL — ABNORMAL LOW (ref 7–18)
Bilirubin,Total: 0.2 mg/dL (ref 0.2–1.0)
Calcium, Total: 9 mg/dL (ref 8.5–10.1)
Chloride: 106 mmol/L (ref 98–107)
Co2: 28 mmol/L (ref 21–32)
Creatinine: 1.11 mg/dL (ref 0.60–1.30)
EGFR (Non-African Amer.): >60
Glucose: 91 mg/dL (ref 65–99)
OSMOLALITY: 284 (ref 275–301)
Potassium: 3.6 mmol/L (ref 3.5–5.1)
SGPT (ALT): 50 U/L
SODIUM: 144 mmol/L (ref 136–145)
TOTAL PROTEIN: 7.6 g/dL (ref 6.4–8.2)

## 2013-12-29 LAB — URINALYSIS, COMPLETE
BACTERIA: NONE SEEN
Bilirubin,UR: NEGATIVE
Glucose,UR: NEGATIVE mg/dL (ref 0–75)
Ketone: NEGATIVE
Leukocyte Esterase: NEGATIVE
Nitrite: NEGATIVE
Ph: 6 (ref 4.5–8.0)
Protein: 30
RBC,UR: 998 /HPF (ref 0–5)
Specific Gravity: 1.018 (ref 1.003–1.030)
WBC UR: 1 /HPF (ref 0–5)

## 2014-03-17 ENCOUNTER — Encounter (HOSPITAL_COMMUNITY): Payer: Self-pay | Admitting: Emergency Medicine

## 2014-03-17 DIAGNOSIS — R109 Unspecified abdominal pain: Secondary | ICD-10-CM | POA: Diagnosis present

## 2014-03-17 DIAGNOSIS — Z79899 Other long term (current) drug therapy: Secondary | ICD-10-CM | POA: Diagnosis not present

## 2014-03-17 DIAGNOSIS — Z87442 Personal history of urinary calculi: Secondary | ICD-10-CM | POA: Insufficient documentation

## 2014-03-17 DIAGNOSIS — I10 Essential (primary) hypertension: Secondary | ICD-10-CM | POA: Insufficient documentation

## 2014-03-17 DIAGNOSIS — F419 Anxiety disorder, unspecified: Secondary | ICD-10-CM | POA: Diagnosis not present

## 2014-03-17 DIAGNOSIS — Z8639 Personal history of other endocrine, nutritional and metabolic disease: Secondary | ICD-10-CM | POA: Insufficient documentation

## 2014-03-17 DIAGNOSIS — Z720 Tobacco use: Secondary | ICD-10-CM | POA: Diagnosis not present

## 2014-03-17 DIAGNOSIS — N23 Unspecified renal colic: Secondary | ICD-10-CM | POA: Insufficient documentation

## 2014-03-17 NOTE — ED Notes (Signed)
Patient reports left flank pain radiating into groin that started yesterday. Reports history of kidney stones. Also reports nausea.

## 2014-03-18 ENCOUNTER — Emergency Department (HOSPITAL_COMMUNITY)
Admission: EM | Admit: 2014-03-18 | Discharge: 2014-03-18 | Disposition: A | Discharge status: Home / Self Care | Payer: 59 | Attending: Emergency Medicine | Admitting: Emergency Medicine

## 2014-03-18 ENCOUNTER — Emergency Department (HOSPITAL_COMMUNITY): Payer: 59

## 2014-03-18 DIAGNOSIS — R109 Unspecified abdominal pain: Secondary | ICD-10-CM

## 2014-03-18 DIAGNOSIS — N23 Unspecified renal colic: Secondary | ICD-10-CM | POA: Diagnosis not present

## 2014-03-18 LAB — URINALYSIS, ROUTINE W REFLEX MICROSCOPIC
Glucose, UA: NEGATIVE mg/dL
LEUKOCYTES UA: NEGATIVE
Nitrite: NEGATIVE
Specific Gravity, Urine: 1.025 (ref 1.005–1.030)
UROBILINOGEN UA: 0.2 mg/dL (ref 0.0–1.0)
pH: 6 (ref 5.0–8.0)

## 2014-03-18 LAB — BASIC METABOLIC PANEL
ANION GAP: 8 (ref 5–15)
BUN: 10 mg/dL (ref 6–23)
CHLORIDE: 108 meq/L (ref 96–112)
CO2: 24 mmol/L (ref 19–32)
Calcium: 9.1 mg/dL (ref 8.4–10.5)
Creatinine, Ser: 0.98 mg/dL (ref 0.50–1.35)
Glucose, Bld: 128 mg/dL — ABNORMAL HIGH (ref 70–99)
POTASSIUM: 3.7 mmol/L (ref 3.5–5.1)
SODIUM: 140 mmol/L (ref 135–145)

## 2014-03-18 LAB — CBC WITH DIFFERENTIAL/PLATELET
BASOS ABS: 0.1 10*3/uL (ref 0.0–0.1)
Basophils Relative: 1 % (ref 0–1)
EOS ABS: 0.3 10*3/uL (ref 0.0–0.7)
Eosinophils Relative: 5 % (ref 0–5)
HCT: 45.2 % (ref 39.0–52.0)
HEMOGLOBIN: 15.9 g/dL (ref 13.0–17.0)
Lymphocytes Relative: 45 % (ref 12–46)
Lymphs Abs: 3.4 10*3/uL (ref 0.7–4.0)
MCH: 33.3 pg (ref 26.0–34.0)
MCHC: 35.2 g/dL (ref 30.0–36.0)
MCV: 94.6 fL (ref 78.0–100.0)
MONOS PCT: 8 % (ref 3–12)
Monocytes Absolute: 0.6 10*3/uL (ref 0.1–1.0)
NEUTROS ABS: 3.1 10*3/uL (ref 1.7–7.7)
Neutrophils Relative %: 41 % — ABNORMAL LOW (ref 43–77)
PLATELETS: 283 10*3/uL (ref 150–400)
RBC: 4.78 MIL/uL (ref 4.22–5.81)
RDW: 13 % (ref 11.5–15.5)
WBC: 7.4 10*3/uL (ref 4.0–10.5)

## 2014-03-18 LAB — URINE MICROSCOPIC-ADD ON

## 2014-03-18 MED ORDER — HYDROMORPHONE HCL 1 MG/ML IJ SOLN
1.0000 mg | Freq: Once | INTRAMUSCULAR | Status: AC
  Administered 2014-03-18: 1 mg via INTRAVENOUS
  Filled 2014-03-18: qty 1

## 2014-03-18 MED ORDER — ONDANSETRON HCL 4 MG/2ML IJ SOLN
4.0000 mg | Freq: Once | INTRAMUSCULAR | Status: AC
  Administered 2014-03-18: 4 mg via INTRAVENOUS
  Filled 2014-03-18: qty 2

## 2014-03-18 MED ORDER — KETOROLAC TROMETHAMINE 30 MG/ML IJ SOLN
30.0000 mg | Freq: Once | INTRAMUSCULAR | Status: AC
  Administered 2014-03-18: 30 mg via INTRAVENOUS
  Filled 2014-03-18: qty 1

## 2014-03-18 MED ORDER — ONDANSETRON 4 MG PO TBDP: ORAL_TABLET | ORAL | Status: DC

## 2014-03-18 MED ORDER — OXYCODONE-ACETAMINOPHEN 5-325 MG PO TABS: 1.0000 | ORAL_TABLET | ORAL | Status: DC | PRN

## 2014-03-18 MED ORDER — TAMSULOSIN HCL 0.4 MG PO CAPS: 0.4000 mg | ORAL_CAPSULE | Freq: Every day | ORAL | Status: DC

## 2014-03-18 NOTE — Discharge Instructions (Signed)
Ureteral Colic (Kidney Stones) °Ureteral colic is the result of a condition when kidney stones form inside the kidney. Once kidney stones are formed they may move into the tube that connects the kidney with the bladder (ureter). If this occurs, this condition may cause pain (colic) in the ureter.  °CAUSES  °Pain is caused by stone movement in the ureter and the obstruction caused by the stone. °SYMPTOMS  °The pain comes and goes as the ureter contracts around the stone. The pain is usually intense, sharp, and stabbing in character. The location of the pain may move as the stone moves through the ureter. When the stone is near the kidney the pain is usually located in the back and radiates to the belly (abdomen). When the stone is ready to pass into the bladder the pain is often located in the lower abdomen on the side the stone is located. At this location, the symptoms may mimic those of a urinary tract infection with urinary frequency. Once the stone is located here it often passes into the bladder and the pain disappears completely. °TREATMENT  °· Your caregiver will provide you with medicine for pain relief. °· You may require specialized follow-up X-rays. °· The absence of pain does not always mean that the stone has passed. It may have just stopped moving. If the urine remains completely obstructed, it can cause loss of kidney function or even complete destruction of the involved kidney. It is your responsibility and in your interest that X-rays and follow-ups as suggested by your caregiver are completed. Relief of pain without passage of the stone can be associated with severe damage to the kidney, including loss of kidney function on that side. °· If your stone does not pass on its own, additional measures may be taken by your caregiver to ensure its removal. °HOME CARE INSTRUCTIONS  °· Increase your fluid intake. Water is the preferred fluid since juices containing vitamin C may acidify the urine making it  less likely for certain stones (uric acid stones) to pass. °· Strain all urine. A strainer will be provided. Keep all particulate matter or stones for your caregiver to inspect. °· Take your pain medicine as directed. °· Make a follow-up appointment with your caregiver as directed. °· Remember that the goal is passage of your stone. The absence of pain does not mean the stone is gone. Follow your caregiver's instructions. °· Only take over-the-counter or prescription medicines for pain, discomfort, or fever as directed by your caregiver. °SEEK MEDICAL CARE IF:  °· Pain cannot be controlled with the prescribed medicine. °· You have a fever. °· Pain continues for longer than your caregiver advises it should. °· There is a change in the pain, and you develop chest discomfort or constant abdominal pain. °· You feel faint or pass out. °MAKE SURE YOU:  °· Understand these instructions. °· Will watch your condition. °· Will get help right away if you are not doing well or get worse. °Document Released: 12/17/2004 Document Revised: 07/04/2012 Document Reviewed: 09/03/2010 °[employer]® Patient Information ©2015 [employer]. This information is not intended to replace advice given to you by your health care provider. Make sure you discuss any questions you have with your health care provider. ° °

## 2014-03-18 NOTE — ED Provider Notes (Signed)
CSN: [id number]     Arrival date & time 03/17/14  2302 History  This chart was scribed for Loren Raceravid Jyaire Koudelka, MD by Roxy Cedarhandni Bhalodia, ED Scribe. This patient was seen in room APA07/APA07 and the patient's care was started at 12:11 AM.   Chief Complaint  Patient presents with  . Flank Pain   Patient is a 23 y.o. male presenting with flank pain. The history is provided by the patient. No language interpreter was used.  Flank Pain This is a new problem. The current episode started yesterday. The problem occurs constantly. The problem has not changed since onset.Pertinent negatives include no chest pain, no abdominal pain, no headaches and no shortness of breath. Nothing aggravates the symptoms. Nothing relieves the symptoms. He has tried nothing for the symptoms.   HPI Comments: Salome ArntJustin T Hedeen is a 23 y.o. male with a history of hypertension, hyperlipidemia, kidney stones, and chronic headaches, and recent joint replacement, who presents to the Emergency Department complaining of moderate left flank pain that began yesterday. Patient states that the pain radiates to his groin. He states he passed his most recent kidney stone 3 weeks ago. He reports associated nausea and hematuria. He states that the pain he feels today is consistent with pain he has had in the past associated with kidney stones.  Past Medical History  Diagnosis Date  . Hypertension   . Hyperlipidemia   . Kidney stones   . Chronic headaches   . Anxiety    Past Surgical History  Procedure Laterality Date  . Lithotripsy    . Joint replacement     Family History  Problem Relation Age of Onset  . Hypertension Father    History  Substance Use Topics  . Smoking status: Current Every Day Smoker -- 0.50 packs/day  . Smokeless tobacco: Not on file  . Alcohol Use: 3.0 oz/week    5 Cans of beer per week   Review of Systems  Respiratory: Negative for shortness of breath.   Cardiovascular: Negative for chest pain.   Gastrointestinal: Positive for nausea. Negative for abdominal pain.  Genitourinary: Positive for hematuria and flank pain.  Neurological: Negative for headaches.  All other systems reviewed and are negative.  Allergies  Review of patient's allergies indicates no known allergies.  Home Medications   Prior to Admission medications   Medication Sig Start Date End Date Taking? Authorizing Provider  ALPRAZolam Prudy Feeler(XANAX) 0.5 MG tablet Take 0.5 mg by mouth at bedtime as needed for anxiety.    Yes Historical Provider, MD  metoprolol (LOPRESSOR) 50 MG tablet Take 50 mg by mouth 2 (two) times daily.   Yes Historical Provider, MD  HYDROcodone-acetaminophen (NORCO/VICODIN) 5-325 MG per tablet Take if motrin not helping 12/11/13   Benny LennertJoseph L Zammit, MD  HYDROcodone-acetaminophen (NORCO/VICODIN) 5-325 MG per tablet Take 2 tablets by mouth every 4 (four) hours as needed. 12/11/13   Glynn OctaveStephen Rancour, MD  ibuprofen (ADVIL,MOTRIN) 800 MG tablet Take 1 tablet (800 mg total) by mouth every 8 (eight) hours as needed for moderate pain. 12/11/13   Benny LennertJoseph L Zammit, MD  ondansetron (ZOFRAN ODT) 4 MG disintegrating tablet 4mg  ODT q4 hours prn nausea/vomit 03/18/14   Loren Raceravid Shep Porter, MD  oxyCODONE-acetaminophen (PERCOCET) 5-325 MG per tablet Take 1-2 tablets by mouth every 4 (four) hours as needed for severe pain. 03/18/14   Loren Raceravid Lamyah Creed, MD  tamsulosin (FLOMAX) 0.4 MG CAPS capsule Take 1 capsule (0.4 mg total) by mouth daily. 03/18/14   Loren Raceravid Armstead Heiland, MD   Triage  Vitals: BP 118/78 mmHg  Pulse 95  Temp(Src) 98.3 F (36.8 C) (Oral)  Resp 20  Ht 5\' 8"  (1.727 m)  Wt 238 lb (107.956 kg)  BMI 36.20 kg/m2  SpO2 98%  Physical Exam  Constitutional: He is oriented to person, place, and time. He appears well-developed and well-nourished. No distress.  HENT:  Head: Normocephalic and atraumatic.  Mouth/Throat: Oropharynx is clear and moist.  Eyes: Conjunctivae and EOM are normal. Pupils are equal, round, and reactive to  light.  Neck: Normal range of motion. Neck supple. No tracheal deviation present.  Cardiovascular: Normal rate and regular rhythm.   Pulmonary/Chest: Effort normal and breath sounds normal. No respiratory distress. He has no wheezes. He has no rales.  Abdominal: Soft. Bowel sounds are normal. He exhibits no distension and no mass. There is no tenderness. There is no rebound and no guarding.  Musculoskeletal: Normal range of motion. He exhibits no edema or tenderness.  No CVA tenderness bilaterally.  Neurological: He is alert and oriented to person, place, and time.  Skin: Skin is warm and dry. No rash noted. No erythema.  Psychiatric: He has a normal mood and affect. His behavior is normal.  Nursing note and vitals reviewed.  ED Course  Procedures (including critical care time)  DIAGNOSTIC STUDIES: Oxygen Saturation is 98% on RA, normal by my interpretation.    COORDINATION OF CARE: 12:16 AM- Discussed plans to order diagnostic lab work and imaging. Will give patient Zofran, Toradol and Dilaudid for pain management. Pt advised of plan for treatment and pt agrees.  Labs Review Labs Reviewed  URINALYSIS, ROUTINE W REFLEX MICROSCOPIC - Abnormal; Notable for the following:    APPearance HAZY (*)    Hgb urine dipstick LARGE (*)    Bilirubin Urine SMALL (*)    Ketones, ur TRACE (*)    Protein, ur TRACE (*)    All other components within normal limits  CBC WITH DIFFERENTIAL - Abnormal; Notable for the following:    Neutrophils Relative % 41 (*)    All other components within normal limits  BASIC METABOLIC PANEL - Abnormal; Notable for the following:    Glucose, Bld 128 (*)    All other components within normal limits  URINE MICROSCOPIC-ADD ON - Abnormal; Notable for the following:    Bacteria, UA FEW (*)    All other components within normal limits    Imaging Review Dg Abd 1 View  03/18/2014   CLINICAL DATA:  LEFT flank pain beginning yesterday, history of kidney stones and  lithotripsy.  EXAM: ABDOMEN - 1 VIEW  COMPARISON:  CT of the abdomen and pelvis December 11, 2013  FINDINGS: Bowel gas pattern is nondilated and nonobstructive, paucity of small bowel gas. Tiny Punctate calcifications project in the region of the kidneys bilaterally. No intra-abdominal mass effect. Osseous structures are nonsuspicious, bilateral hip total arthroplasties.  IMPRESSION: Tiny calcifications project in the kidneys bilaterally.  Nonobstructive bowel gas pattern.   Electronically Signed   By: Awilda Metro   On: 03/18/2014 01:32     EKG Interpretation None     MDM   Final diagnoses:  Left flank pain  Renal colic on left side      I personally performed the services described in this documentation, which was scribed in my presence. The recorded information has been reviewed and is accurate.  Patient states pain is improved. Given the fact patient has had multiple CT scans for similar presentation, it was deferred this visit. KUB with  multiple bilateral renal stones. Discharge patient home with pain medication. Return precautions given. Advised follow-up with the urologist. Patient voices understanding of discharge instructions and agrees with plan.  Loren Raceravid Kerem Gilmer, MD 03/18/14 [phone]

## 2014-05-11 ENCOUNTER — Emergency Department (HOSPITAL_COMMUNITY): Payer: 59

## 2014-05-11 ENCOUNTER — Emergency Department (HOSPITAL_COMMUNITY)
Admission: EM | Admit: 2014-05-11 | Discharge: 2014-05-11 | Disposition: A | Discharge status: Home / Self Care | Payer: 59 | Attending: Emergency Medicine | Admitting: Emergency Medicine

## 2014-05-11 ENCOUNTER — Encounter (HOSPITAL_COMMUNITY): Payer: Self-pay | Admitting: *Deleted

## 2014-05-11 DIAGNOSIS — N200 Calculus of kidney: Secondary | ICD-10-CM | POA: Insufficient documentation

## 2014-05-11 DIAGNOSIS — Z79899 Other long term (current) drug therapy: Secondary | ICD-10-CM | POA: Insufficient documentation

## 2014-05-11 DIAGNOSIS — Z8639 Personal history of other endocrine, nutritional and metabolic disease: Secondary | ICD-10-CM | POA: Diagnosis not present

## 2014-05-11 DIAGNOSIS — G8929 Other chronic pain: Secondary | ICD-10-CM | POA: Insufficient documentation

## 2014-05-11 DIAGNOSIS — Z720 Tobacco use: Secondary | ICD-10-CM | POA: Diagnosis not present

## 2014-05-11 DIAGNOSIS — I10 Essential (primary) hypertension: Secondary | ICD-10-CM | POA: Diagnosis not present

## 2014-05-11 DIAGNOSIS — F419 Anxiety disorder, unspecified: Secondary | ICD-10-CM | POA: Insufficient documentation

## 2014-05-11 DIAGNOSIS — R109 Unspecified abdominal pain: Secondary | ICD-10-CM | POA: Diagnosis present

## 2014-05-11 LAB — COMPREHENSIVE METABOLIC PANEL
ALBUMIN: 4.7 g/dL (ref 3.5–5.2)
ALK PHOS: 90 U/L (ref 39–117)
ALT: 44 U/L (ref 0–53)
AST: 31 U/L (ref 0–37)
Anion gap: 6 (ref 5–15)
BUN: 12 mg/dL (ref 6–23)
CO2: 27 mmol/L (ref 19–32)
Calcium: 9.5 mg/dL (ref 8.4–10.5)
Chloride: 106 mmol/L (ref 96–112)
Creatinine, Ser: 1.01 mg/dL (ref 0.50–1.35)
GFR calc Af Amer: >90 mL/min (ref >90)
GFR calc non Af Amer: >90 mL/min (ref >90)
Glucose, Bld: 94 mg/dL (ref 70–99)
POTASSIUM: 3.8 mmol/L (ref 3.5–5.1)
Sodium: 139 mmol/L (ref 135–145)
TOTAL PROTEIN: 8.1 g/dL (ref 6.0–8.3)
Total Bilirubin: 0.7 mg/dL (ref 0.3–1.2)

## 2014-05-11 LAB — URINALYSIS, ROUTINE W REFLEX MICROSCOPIC
Bilirubin Urine: NEGATIVE
GLUCOSE, UA: NEGATIVE mg/dL
KETONES UR: NEGATIVE mg/dL
LEUKOCYTES UA: NEGATIVE
NITRITE: NEGATIVE
Protein, ur: NEGATIVE mg/dL
Specific Gravity, Urine: 1.015 (ref 1.005–1.030)
Urobilinogen, UA: 0.2 mg/dL (ref 0.0–1.0)
pH: 7 (ref 5.0–8.0)

## 2014-05-11 LAB — CBC WITH DIFFERENTIAL/PLATELET
BASOS ABS: 0 10*3/uL (ref 0.0–0.1)
Basophils Relative: 0 % (ref 0–1)
EOS PCT: 4 % (ref 0–5)
Eosinophils Absolute: 0.3 10*3/uL (ref 0.0–0.7)
HCT: 46.8 % (ref 39.0–52.0)
HEMOGLOBIN: 16 g/dL (ref 13.0–17.0)
Lymphocytes Relative: 29 % (ref 12–46)
Lymphs Abs: 2.3 10*3/uL (ref 0.7–4.0)
MCH: 33.8 pg (ref 26.0–34.0)
MCHC: 34.2 g/dL (ref 30.0–36.0)
MCV: 98.9 fL (ref 78.0–100.0)
Monocytes Absolute: 0.7 10*3/uL (ref 0.1–1.0)
Monocytes Relative: 9 % (ref 3–12)
NEUTROS ABS: 4.6 10*3/uL (ref 1.7–7.7)
NEUTROS PCT: 58 % (ref 43–77)
Platelets: 303 10*3/uL (ref 150–400)
RBC: 4.73 MIL/uL (ref 4.22–5.81)
RDW: 13 % (ref 11.5–15.5)
WBC: 7.9 10*3/uL (ref 4.0–10.5)

## 2014-05-11 LAB — URINE MICROSCOPIC-ADD ON

## 2014-05-11 MED ORDER — TAMSULOSIN HCL 0.4 MG PO CAPS: 0.4000 mg | ORAL_CAPSULE | Freq: Every day | ORAL | Status: DC

## 2014-05-11 MED ORDER — ONDANSETRON 4 MG PO TBDP
ORAL_TABLET | ORAL | Status: DC
Start: 2014-05-11 — End: 2014-08-28

## 2014-05-11 MED ORDER — HYDROMORPHONE HCL 1 MG/ML IJ SOLN
1.0000 mg | Freq: Once | INTRAMUSCULAR | Status: AC
  Administered 2014-05-11: 1 mg via INTRAVENOUS
  Filled 2014-05-11: qty 1

## 2014-05-11 MED ORDER — OXYCODONE-ACETAMINOPHEN 5-325 MG PO TABS: 1.0000 | ORAL_TABLET | Freq: Four times a day (QID) | ORAL | Status: DC | PRN

## 2014-05-11 MED ORDER — KETOROLAC TROMETHAMINE 30 MG/ML IJ SOLN
30.0000 mg | Freq: Once | INTRAMUSCULAR | Status: AC
  Administered 2014-05-11: 30 mg via INTRAVENOUS
  Filled 2014-05-11: qty 1

## 2014-05-11 MED ORDER — ONDANSETRON HCL 4 MG/2ML IJ SOLN
4.0000 mg | Freq: Once | INTRAMUSCULAR | Status: AC
  Administered 2014-05-11: 4 mg via INTRAVENOUS
  Filled 2014-05-11: qty 2

## 2014-05-11 NOTE — ED Provider Notes (Signed)
CSN: [id number]     Arrival date & time 05/11/14  1650 History  This chart was scribed for Benny Lennert, MD by Gwenyth Ober, ED Scribe. This patient was seen in room APA12/APA12 and the patient's care was started at 6:21 PM.    Chief Complaint  Patient presents with  . Flank Pain   Patient is a 24 y.o. male presenting with flank pain. The history is provided by the patient. No language interpreter was used.  Flank Pain This is a recurrent problem. The problem occurs constantly. The problem has not changed since onset.Pertinent negatives include no chest pain, no abdominal pain and no headaches. Nothing aggravates the symptoms. Nothing relieves the symptoms. He has tried nothing for the symptoms. The treatment provided no relief.    HPI Comments: Jorge Nichols is a 24 y.o. male with a history of kidney stones who presents to the Emergency Department complaining of right-sided flank pain. He states nausea as an associated symptom. Pt reports passing 1 kidney stone monthly. He has seen 4 urologists with no relief. Pt denies abdominal pain.   Past Medical History  Diagnosis Date  . Hypertension   . Hyperlipidemia   . Kidney stones   . Chronic headaches   . Anxiety    Past Surgical History  Procedure Laterality Date  . Lithotripsy    . Joint replacement     Family History  Problem Relation Age of Onset  . Hypertension Father    History  Substance Use Topics  . Smoking status: Current Every Day Smoker -- 0.50 packs/day  . Smokeless tobacco: Not on file  . Alcohol Use: 3.0 oz/week    5 Cans of beer per week    Review of Systems  Constitutional: Negative for appetite change and fatigue.  HENT: Negative for congestion, ear discharge and sinus pressure.   Eyes: Negative for discharge.  Respiratory: Negative for cough.   Cardiovascular: Negative for chest pain.  Gastrointestinal: Positive for nausea. Negative for abdominal pain and diarrhea.  Genitourinary: Positive for flank  pain. Negative for frequency and hematuria.  Musculoskeletal: Negative for back pain.  Skin: Negative for rash.  Neurological: Negative for seizures and headaches.  Psychiatric/Behavioral: Negative for hallucinations.  All other systems reviewed and are negative.  Allergies  Review of patient's allergies indicates no known allergies.  Home Medications   Prior to Admission medications   Medication Sig Start Date End Date Taking? Authorizing Provider  ALPRAZolam Prudy Feeler) 0.5 MG tablet Take 0.5 mg by mouth at bedtime as needed for anxiety.     Historical Provider, MD  HYDROcodone-acetaminophen (NORCO/VICODIN) 5-325 MG per tablet Take if motrin not helping 12/11/13   Benny Lennert, MD  HYDROcodone-acetaminophen (NORCO/VICODIN) 5-325 MG per tablet Take 2 tablets by mouth every 4 (four) hours as needed. 12/11/13   Glynn Octave, MD  ibuprofen (ADVIL,MOTRIN) 800 MG tablet Take 1 tablet (800 mg total) by mouth every 8 (eight) hours as needed for moderate pain. 12/11/13   Benny Lennert, MD  metoprolol (LOPRESSOR) 50 MG tablet Take 50 mg by mouth 2 (two) times daily.    Historical Provider, MD  ondansetron (ZOFRAN ODT) 4 MG disintegrating tablet  ODT q4 hours prn nausea/vomit 03/18/14   Loren Racer, MD  oxyCODONE-acetaminophen (PERCOCET) 5-325 MG per tablet Take 1-2 tablets by mouth every 4 (four) hours as needed for severe pain. 03/18/14   Loren Racer, MD  tamsulosin (FLOMAX) 0.4 MG CAPS capsule Take 1 capsule (0.4 mg total) by mouth  daily. 03/18/14   Loren Raceravid Yelverton, MD   BP 136/81 mmHg  Pulse 88  Temp(Src) 98.8 F (37.1 C) (Oral)  Resp 20  Ht 5\' 8"  (1.727 m)  Wt 250 lb (113.399 kg)  BMI 38.02 kg/m2  SpO2 100% Physical Exam  Constitutional: He is oriented to person, place, and time. He appears well-developed.  HENT:  Head: Normocephalic.  Eyes: Conjunctivae and EOM are normal. No scleral icterus.  Neck: Neck supple. No thyromegaly present.  Cardiovascular: Normal rate and  regular rhythm.  Exam reveals no gallop and no friction rub.   No murmur heard. Pulmonary/Chest: No stridor. He has no wheezes. He has no rales. He exhibits no tenderness.  Abdominal: Soft. He exhibits no distension. There is no rebound.  Moderate right flank tenderness  Musculoskeletal: Normal range of motion. He exhibits no edema.  Lymphadenopathy:    He has no cervical adenopathy.  Neurological: He is oriented to person, place, and time. He exhibits normal muscle tone. Coordination normal.  Skin: No rash noted. No erythema.  Psychiatric: He has a normal mood and affect. His behavior is normal.  Nursing note and vitals reviewed.   ED Course  Procedures (including critical care time) DIAGNOSTIC STUDIES: Oxygen Saturation is 100% on RA, normal by my interpretation.    COORDINATION OF CARE: 6:24 PM Discussed treatment plan with pt at bedside and pt agreed to plan.  Labs Review Labs Reviewed  URINALYSIS, ROUTINE W REFLEX MICROSCOPIC    Imaging Review No results found.   EKG Interpretation None      MDM   Final diagnoses:  None   Kidney stone   The chart was scribed for me under my direct supervision.  I personally performed the history, physical, and medical decision making and all procedures in the evaluation of this patient.Benny Lennert.    Ayline Dingus L Olimpia Tinch, MD 05/11/14 2130

## 2014-05-11 NOTE — Discharge Instructions (Signed)
Follow up next week with your urologist or [facility] [place] or [place]

## 2014-05-11 NOTE — ED Notes (Signed)
Rt flank pain with nausea, Hx of kidney stones

## 2014-07-13 NOTE — Consult Note (Signed)
Referring Physician:  Governor Rooks :   Primary Care Physician:  Governor Rooks : [facility] Emergency Department, [address], [place], [place] [address], [phone]  Reason for Consult: Admit Date: 16-Jun-2012  Chief Complaint: headache  Reason for Consult: headache   History of Present Illness: History of Present Illness:   24 yo RHD M presents to [facility] with the worst headache of his life.  He reports a sudden 10/10 headache that occured after he coughed.  This was holocephalic and had associated N/V.  Pt has recieved demerol but headache is still 7/10.  He denies any numbness or tingling or weakness.  He does endorse dizziness and blurry vision.  ROS:  General denies complaints   HEENT no complaints   Lungs no complaints   Cardiac no complaints   GI nausea/vomiting   GU no complaints   Musculoskeletal no complaints   Extremities no complaints   Skin no complaints   Neuro headache   Endocrine no complaints   Psych no complaints   Past Medical/Surgical Hx:  ANGER PROBLEMS:   Cluster Headaches:   Kidney Stones:   Lithotripsy:   Denies surgical history.:   Past Medical/ Surgical Hx:  Past Medical History headaches, hypertension   Past Surgical History none   Home Medications: Medication Instructions Last Modified Date/Time  Lopressor 50 mg oral tablet 1 tab(s) orally 2 times a day 27-Mar-14 07:52   Allergies:  No Known Allergies:   Allergies:  Allergies NKDA   Social/Family History: Employment Status: unemployed  Lives With: spouse  Living Arrangements: house  Social History: +tob, one drink daily, denies illicits, married with 2 kids  Family History: father with Factor V def   Physical Exam: General: overweight, moderate distress  HEENT: normocephalic, sclera nonicteric, oropharynx clear  Neck: supple, no JVD, no bruits  Chest: CTA B, no wheezing, good movement  Cardiac: RRR, no murmurs, no edema, 2+ pulses  Extremities: no  C/C/E, FROM   Neurologic Exam: Mental Status: alert and oriented x 3, normal speech and language, follows complex commands  Cranial Nerves: PERRLA, EOMI yet a partial CN III palsy, nl VF, face symmetric, tongue midline, shoulder shrug equal  Motor Exam: 5/5 B normal, tone, no tremor  Deep Tendon Reflexes: 2+/4 B, plantars downgoing B, no Hoffman  Sensory Exam: pinprick, temperature, and vibration intact B  Coordination: FTN and HTS WNL, nl RAM, nl gait   Lab Results: Routine Micro:  27-Mar-14 09:52   Micro Text Report CSF CULTURE/AERO/GRAM ST   GRAM STAIN                FEW WHITE BLOOD CELLS   GRAM STAIN                MANY RED BLOOD CELLS   GRAM STAIN                NO ORGANISMS SEEN   ANTIBIOTIC                       Gram Stain 1 FEW WHITE BLOOD CELLS  Gram Stain 2 MANY RED BLOOD CELLS  Gram Stain 3 NO ORGANISMS SEEN  Result(s) reported on 16 Jun 2012 at 01:44PM.  Routine Chem:  27-Mar-14 09:52   Result Comment interpret with caution - results may be inaccurate due to  - cell clumps in specimen  Result(s) reported on 16 Jun 2012 at 01:09PM.  CSF Analysis:  27-Mar-14 09:52   CSF Tube #  1  Color - Uncentrifuged (CSF) BROWN  Clarity (CSF) TURBID  Color - Centrifuged (CSF) PINK  RBC  (CSF) [id number]  WBC  (CSF) 191  Neutrophils  (CSF) 89  Lymphocytes  (CSF) 4  Monocytes/Macrophages  (CSF) 3  Eosinophil  (CSF) 4  Other Cells  (CSF) 0  Glucose, CSF 62 (Result(s) reported on 16 Jun 2012 at 12:52PM.)  Protein, CSF 43 (Result(s) reported on 16 Jun 2012 at 12:52PM.)  Routine Hem:  27-Mar-14 13:41   WBC (CBC) 8.6  RBC (CBC) 4.44  Hemoglobin (CBC) 14.5  Hematocrit (CBC) 42.5  Platelet Count (CBC) 265 (Result(s) reported on 16 Jun 2012 at 01:54PM.)  MCV 96  MCH 32.7  MCHC 34.1  RDW 12.6   Radiology Results: CT:    27-Mar-14 08:29, CT Head Without Contrast  CT Head Without Contrast   REASON FOR EXAM:    left side headache, severe abrupt  COMMENTS:       PROCEDURE: CT   - CT HEAD WITHOUT CONTRAST  - Jun 16 2012  8:29AM     RESULT: Comparison:  None    Technique: Multiple axial images from the foramen magnum to the vertex   were obtained without IV contrast.    Findings:    There is no evidence for mass effect, midline shift, or extra-axial fluid   collections. There is no evidence for space-occupying lesion,   intracranial hemorrhage, or cortical-based area of infarction.   Theosseous structures are unremarkable.    IMPRESSION:    No acute intracranial process.        Verified By: Lewie ChamberOBERT L. SUBER, M.D., MD   Radiology Impression: Radiology Impression: CT of head personally reviewed by me and concerning for an aneurysmal type SAH   Impression/Recommendations: Recommendations:   labs reviewed by me d/w referring physician   Probable subarachnoid hemorrhage-  given CT and history;  it is too early to see xanthrochromia on LP as it happened a few hours ago.  With CT and CN III palsy, this is common in aneursymal type bleed. transfer immediately to facilty with Neurosurgery on-board load 1gm Keppra then continue 750mg  q12h start Nimodipine 60mg  q4h PO start Zocor 40mg  daily PO keep SBP < 160, institute cardene drip if necessary start IVF at 125cc/hr and would keep NPO avoid narcotics and would give tylenol as needed for headache minutes of critical care time spent, evaluating pt, reviewing images and coordinating care.  Electronic Signatures: Meryl DareSmith, Rasheedah Reis C (MD)  (Signed 27-Mar-14 14:06)  Authored: REFERRING PHYSICIAN, Primary Care Physician, Consult, History of Present Illness, Review of Systems, PAST MEDICAL/SURGICAL HISTORY, HOME MEDICATIONS, ALLERGIES, Social/Family History, Physical Exam-, LAB RESULTS, RADIOLOGY RESULTS, Recommendations   Last Updated: 27-Mar-14 14:06 by Meryl DareSmith, Luz Mares C (MD)

## 2014-08-28 ENCOUNTER — Encounter (HOSPITAL_COMMUNITY): Payer: Self-pay | Admitting: Emergency Medicine

## 2014-08-28 ENCOUNTER — Emergency Department (HOSPITAL_COMMUNITY)
Admission: EM | Admit: 2014-08-28 | Discharge: 2014-08-28 | Disposition: A | Discharge status: Home / Self Care | Payer: 59 | Attending: Emergency Medicine | Admitting: Emergency Medicine

## 2014-08-28 DIAGNOSIS — N200 Calculus of kidney: Secondary | ICD-10-CM | POA: Insufficient documentation

## 2014-08-28 DIAGNOSIS — Z8739 Personal history of other diseases of the musculoskeletal system and connective tissue: Secondary | ICD-10-CM | POA: Insufficient documentation

## 2014-08-28 DIAGNOSIS — Z87891 Personal history of nicotine dependence: Secondary | ICD-10-CM | POA: Diagnosis not present

## 2014-08-28 DIAGNOSIS — R109 Unspecified abdominal pain: Secondary | ICD-10-CM | POA: Diagnosis present

## 2014-08-28 DIAGNOSIS — Z79899 Other long term (current) drug therapy: Secondary | ICD-10-CM | POA: Insufficient documentation

## 2014-08-28 DIAGNOSIS — I10 Essential (primary) hypertension: Secondary | ICD-10-CM | POA: Diagnosis not present

## 2014-08-28 DIAGNOSIS — F419 Anxiety disorder, unspecified: Secondary | ICD-10-CM | POA: Diagnosis not present

## 2014-08-28 LAB — URINALYSIS, ROUTINE W REFLEX MICROSCOPIC
Bilirubin Urine: NEGATIVE
Glucose, UA: NEGATIVE mg/dL
KETONES UR: NEGATIVE mg/dL
LEUKOCYTES UA: NEGATIVE
Nitrite: NEGATIVE
Protein, ur: NEGATIVE mg/dL
Specific Gravity, Urine: 1.02 (ref 1.005–1.030)
UROBILINOGEN UA: 0.2 mg/dL (ref 0.0–1.0)
pH: 5.5 (ref 5.0–8.0)

## 2014-08-28 LAB — URINE MICROSCOPIC-ADD ON

## 2014-08-28 MED ORDER — OXYCODONE-ACETAMINOPHEN 5-325 MG PO TABS: 1.0000 | ORAL_TABLET | ORAL | Status: DC | PRN

## 2014-08-28 MED ORDER — PROMETHAZINE HCL 25 MG PO TABS: 25.0000 mg | ORAL_TABLET | Freq: Four times a day (QID) | ORAL | Status: DC | PRN

## 2014-08-28 MED ORDER — OXYCODONE-ACETAMINOPHEN 5-325 MG PO TABS
2.0000 | ORAL_TABLET | Freq: Once | ORAL | Status: AC
  Administered 2014-08-28: 2 via ORAL
  Filled 2014-08-28: qty 2

## 2014-08-28 MED ORDER — IBUPROFEN 800 MG PO TABS: 800.0000 mg | ORAL_TABLET | Freq: Three times a day (TID) | ORAL | Status: DC

## 2014-08-28 MED ORDER — TAMSULOSIN HCL 0.4 MG PO CAPS: 0.4000 mg | ORAL_CAPSULE | Freq: Two times a day (BID) | ORAL | Status: DC

## 2014-08-28 MED ORDER — KETOROLAC TROMETHAMINE 30 MG/ML IJ SOLN
30.0000 mg | Freq: Once | INTRAMUSCULAR | Status: AC
  Administered 2014-08-28: 30 mg via INTRAVENOUS
  Filled 2014-08-28: qty 1

## 2014-08-28 NOTE — ED Notes (Signed)
PT c/o right sided flank pain and increased urinary frequency x1 day. PT states hx of kidney stones.

## 2014-08-28 NOTE — ED Notes (Signed)
MD at bedside. 

## 2014-08-28 NOTE — Discharge Instructions (Signed)
Your exam and or your xrays have shown that you likely have a kidney stone.  You should follow up with the Urologist of your choosing or the Urologist listed above in the next 2-3 days if you have not passed the stone.  You should urinate in to the strainer until you pass the stone.    Flomax helps with passing the stone by opening up the Ureters (tubes), Vicodin and an antiinflammatory for pain if you are not allergic to these medicines.  Phenergan or Zofran for nausea.  Return to the ER for severe or worsening pain, vomiting or fevers or if you are unable to control your pain with the medicines provided.  Kidney Stones Kidney stones (ureteral lithiasis) are deposits that form inside your kidneys. The intense pain is caused by the stone moving through the urinary tract. When the stone moves, the ureter goes into spasm around the stone. The stone is usually passed in the urine.  CAUSES  A disorder that makes certain neck glands produce too much parathyroid hormone (primary hyperparathyroidism).  A buildup of uric acid crystals.  Narrowing (stricture) of the ureter.  A kidney obstruction present at birth (congenital obstruction).  Previous surgery on the kidney or ureters.  Numerous kidney infections.  SYMPTOMS  Feeling sick to your stomach (nauseous).  Throwing up (vomiting).  Blood in the urine (hematuria).  Pain that usually spreads (radiates) to the groin.  Frequency or urgency of urination.  DIAGNOSIS  Taking a history and physical exam.  Blood or urine tests.  Computerized X-ray scan (CT scan).  Occasionally, an examination of the inside of the urinary bladder (cystoscopy) is performed.  TREATMENT  Observation.  Increasing your fluid intake.  Surgery may be needed if you have severe pain or persistent obstruction.  The size, location, and chemical composition are all important variables that will determine the proper choice of action for you. Talk to your caregiver to better  understand your situation so that you will minimize the risk of injury to yourself and your kidney.  HOME CARE INSTRUCTIONS  Drink enough water and fluids to keep your urine clear or pale yellow.  Strain all urine through the provided strainer. Keep all particulate matter and stones for your caregiver to see. The stone causing the pain may be as small as a grain of salt. It is very important to use the strainer each and every time you pass your urine. The collection of your stone will allow your caregiver to analyze it and verify that a stone has actually passed.  Only take over-the-counter or prescription medicines for pain, discomfort, or fever as directed by your caregiver.  Make a follow-up appointment with your caregiver as directed.  Get follow-up X-rays if required. The absence of pain does not always mean that the stone has passed. It may have only stopped moving. If the urine remains completely obstructed, it can cause loss of kidney function or even complete destruction of the kidney. It is your responsibility to make sure X-rays and follow-ups are completed. Ultrasounds of the kidney can show blockages and the status of the kidney. Ultrasounds are not associated with any radiation and can be performed easily in a matter of minutes.  SEEK IMMEDIATE MEDICAL CARE IF:  Pain cannot be controlled with the prescribed medicine.  You have a fever.  The severity or intensity of pain increases over 18 hours and is not relieved by pain medicine.  You develop a new onset of abdominal pain.  You   feel faint or pass out.  MAKE SURE YOU:  Understand these instructions.  Will watch your condition.  Will get help right away if you are not doing well or get worse.  Document Released: 03/09/2005 Document Revised: 02/26/2011 Document Reviewed: 07/05/2009 ExitCare Patient Information 2012 [employer].  RESOURCE GUIDE  Chronic Pain Problems: Contact [facility]  [phone] Patients  need to be referred by their primary care doctor.  Insufficient Money for Medicine: Contact United Way:  call "211" or [facility] [phone].  No Primary Care Doctor: Call [facility]  [phone] - can help you locate a primary care doctor that  accepts your insurance, provides certain services, etc. Physician Referral Service- 1-800-533-3463  Agencies that provide inexpensive medical care: [facility]  [phone] [facility]  [phone] [facility]  [phone] [facility]  [phone] [facility]  [phone] [facility]  [phone]  Medicaid-accepting [place] Providers: [facility]- [address]  [phone], Mon-Fri 9am-7pm, Sat 9am-1pm [facility]- [address], [address]  [phone] [facility]- [address], [address]  [phone] [facility]- [address] [place] [address]  [phone] [facility]- [address], [address], [phone]  Only accepts [place] Access Medicaid patients after they have their name  applied to their card  Self Pay (no insurance) in [place]: Sickle Cell Patients: Dr Eric Dean, [facility]  [address], [phone] [facility]- [address]  [phone]       -     [facility] [place]- [address]       -     [facility]- see information above (Speak to Pam H if you do not have insurance)       -  [facility]- [address], [phone]       -  [facility] [place]- [address],  [phone]       -  [facility]- [address] [place] [address], [phone]       -  Dr Osei-Bonsu-  [address], [place], [phone]       -  [facility]- [address], [phone]       -  [facility] - [address] [place] [address], [phone], also [address], [phone]        -    [facility]- [address], [phone], 1st & 3rd Saturday   every month, 10am-1pm  1) Find a Doctor and Pay Out of Pocket Although you won't have to find out who is covered by your insurance plan, it is a good idea to ask around and get recommendations. You will then need to call the office and see if the doctor you have chosen will accept you as a new patient and what types of options they offer for patients who are self-pay. Some doctors offer discounts or will set up payment plans for their patients who do not have insurance, but you will need to ask so you aren't surprised when you get to your appointment.  2) Contact Your Local Health Department Not all health departments have doctors that can see patients for sick visits, but many do, so it is worth a call to see if yours does. If   you don't know where your local health department is, you can check in your phone book. The CDC also has a tool to help you locate your state's health department, and many state websites also have listings of all of their local health departments.  3) Find a Walk-in Clinic If your illness is not likely to be very severe or complicated, you may want to try a walk in clinic. These are popping up all over the country in pharmacies, drugstores, and shopping centers. They're usually staffed by nurse practitioners or physician assistants that have been trained to treat common illnesses and complaints. They're usually fairly quick and inexpensive. However, if you have serious medical issues or chronic medical problems, these are probably not your best option  STD Testing [place] [facility] [place], [facility], [address], [place], phone [phone] or [phone].  Monday - Friday, call for an appointment. [place] [facility] [place], [facility], [address], [place], phone [phone] or [phone].  Monday - Friday, call for an  appointment.  Abuse/Neglect: [place] Child Abuse Hotline (336) 641-3795 [place] Child Abuse Hotline 800-378-5315 (After Hours)  Emergency Shelter:  [facility] (336) 271-5985  Maternity Homes: [facility] (336) 275-9566 [facility] (704) 372-4663  MRSA Hotline #:   832-7006  [place] Resources  [facility] [place]  [employer] [place] [facility] [address]                 [address]         [address]  [place]                                               [place]                              [place] Phone:  [phone]                                  Phone:  [phone]                   Phone:  [phone]  [place] [facility], [phone] [place] Services - [facility]- 1-888-581-9988       -     [facility] in [place], [address],                                  336-349-4454, Insurance  [place] Child Abuse Hotline (336) 342-1394 or (336) 342-3537 (After Hours)   Behavioral Health Services  Substance Abuse Resources: [facility]  336-882-2125 [facility] 336-784-9470 [facility] 336-285-9073 [facility] 336-845-3988 Residential & Outpatient Substance Abuse Program  800-659-3381  Psychological Services: [facility]  [phone] [facility]  [phone] [facility], [address], [place], ACCESS LINE: 1-800-853-5163 or 336-641-4981, Http://www.[facility].com/services/adult.htm  Dental Assistance  If unable to pay or uninsured, contact:  [facility] or [place] [facility] to become qualified for the adult dental clinic.  Patients   with Medicaid: [facility] [address], [phone], [phone]  If unable to pay, or uninsured, contact  [facility] ([phone]) or [place] [facility] ([phone] in [place], [phone] in [place]) to become qualified for the adult dental clinic  Other Low-Cost Community Dental Services: [facility]- [address], [place], [place], [address], [phone], Ext. [address], 2nd and 4th Thursday of the month at 6:30am.  10 clients each day by appointment, can sometimes see walk-in patients if someone does not show for an appointment. [facility]- [address], [place], [place], [address], [phone] [facility]- [address], [place], [place], [address], [phone] [place] [facility]- [phone] [facility]- [phone] [facility]- [phone]      

## 2014-08-28 NOTE — ED Provider Notes (Signed)
CSN: [id number]     Arrival date & time 08/28/14  1646 History   First MD Initiated Contact with Patient 08/28/14 1711     Chief Complaint  Patient presents with  . Flank Pain     (Consider location/radiation/quality/duration/timing/severity/associated sxs/prior Treatment) HPI Comments: Last PM at dinner he had R flank pain - has since radiated to his groin - he reports tht the pain is burning and worse with urination, 8/10, intermittent, lasts > 1 hour at a time - he is passing urine, it is cloudy and blood tinged.  He did have assocaited vomiting.  Hydrocodone last night with some improvement.  Usually passes 1 / month since 10 years ago.   Hx of KS's in the past.    Patient is a 24 y.o. male presenting with flank pain. The history is provided by the patient.  Flank Pain    Past Medical History  Diagnosis Date  . Hypertension   . Hyperlipidemia   . Kidney stones   . Chronic headaches   . Anxiety    Past Surgical History  Procedure Laterality Date  . Lithotripsy    . Joint replacement     Family History  Problem Relation Age of Onset  . Hypertension Father    History  Substance Use Topics  . Smoking status: Former Smoker -- 0.50 packs/day    Quit date: 06/22/2014  . Smokeless tobacco: Not on file  . Alcohol Use: 3.0 oz/week    5 Cans of beer per week     Comment: occassionally    Review of Systems  Genitourinary: Positive for flank pain.  All other systems reviewed and are negative.     Allergies  Review of patient's allergies indicates no known allergies.  Home Medications   Prior to Admission medications   Medication Sig Start Date End Date Taking? Authorizing Provider  albuterol (PROVENTIL HFA;VENTOLIN HFA) 108 (90 BASE) MCG/ACT inhaler Inhale 2 puffs into the lungs every 6 (six) hours as needed. 08/06/14  Yes Historical Provider, MD  ALPRAZolam Prudy Feeler(XANAX) 0.5 MG tablet Take 0.5 mg by mouth at bedtime as needed for anxiety.    Yes Historical Provider, MD   metoprolol (LOPRESSOR) 50 MG tablet Take 50 mg by mouth 2 (two) times daily.   Yes Historical Provider, MD  zolpidem (AMBIEN) 10 MG tablet Take 10 mg by mouth at bedtime as needed for sleep.  08/02/14  Yes Historical Provider, MD  azithromycin (ZITHROMAX) 250 MG tablet Take 250-500 mg by mouth See admin instructions. Take two tablets by mouth on day 1 then take one tablet daily on days 2 through 5 starting on 08/26/2014 08/26/14   Historical Provider, MD  ibuprofen (ADVIL,MOTRIN) 800 MG tablet Take 1 tablet (800 mg total) by mouth 3 (three) times daily. 08/28/14   Eber HongBrian Mafalda Mcginniss, MD  oxyCODONE-acetaminophen (PERCOCET) 5-325 MG per tablet Take 1 tablet by mouth every 4 (four) hours as needed. 08/28/14   Eber HongBrian Takiah Maiden, MD  promethazine (PHENERGAN) 25 MG tablet Take 1 tablet (25 mg total) by mouth every 6 (six) hours as needed for nausea or vomiting. 08/28/14   Eber HongBrian Adelisa Satterwhite, MD  tamsulosin (FLOMAX) 0.4 MG CAPS capsule Take 1 capsule (0.4 mg total) by mouth 2 (two) times daily. 08/28/14   Eber HongBrian Jream Broyles, MD   BP 131/77 mmHg  Pulse 82  Temp(Src) 97.6 F (36.4 C) (Oral)  Resp 18  Ht 5\' 8"  (1.727 m)  Wt 250 lb (113.399 kg)  BMI 38.02 kg/m2  SpO2 99% Physical  Exam  Constitutional: He appears well-developed and well-nourished. No distress.  HENT:  Head: Normocephalic and atraumatic.  Mouth/Throat: Oropharynx is clear and moist. No oropharyngeal exudate.  Eyes: Conjunctivae and EOM are normal. Pupils are equal, round, and reactive to light. Right eye exhibits no discharge. Left eye exhibits no discharge. No scleral icterus.  Neck: Normal range of motion. Neck supple. No JVD present. No thyromegaly present.  Cardiovascular: Normal rate, regular rhythm, normal heart sounds and intact distal pulses.  Exam reveals no gallop and no friction rub.   No murmur heard. Pulmonary/Chest: Effort normal and breath sounds normal. No respiratory distress. He has no wheezes. He has no rales.  Abdominal: Soft. Bowel sounds are  normal. He exhibits no distension and no mass. There is no tenderness.  Soft nontender abdomen, no guarding, no CVA ttp  Musculoskeletal: Normal range of motion. He exhibits no edema or tenderness.  Lymphadenopathy:    He has no cervical adenopathy.  Neurological: He is alert. Coordination normal.  Skin: Skin is warm and dry. No rash noted. No erythema.  Psychiatric: He has a normal mood and affect. His behavior is normal.  Nursing note and vitals reviewed.   ED Course  Procedures (including critical care time) Labs Review Labs Reviewed  URINALYSIS, ROUTINE W REFLEX MICROSCOPIC (NOT AT [facility]) - Abnormal; Notable for the following:    APPearance HAZY (*)    Hgb urine dipstick LARGE (*)    All other components within normal limits  URINE MICROSCOPIC-ADD ON    Imaging Review No results found.   MDM   Final diagnoses:  Kidney stone    Vital signs are normal, images archived, no signs of hydronephrosis, urinalysis pending to rule out infection, if there is signs of UTI the patient will need a CT scan to rule out kidney stone in the presence of a urinary tract infection. Pain medications ordered as below. The patient is in agreement with the plan.  UA with blood, no infection - sx well controlled, outpt fu reasonable, pt in agreement.  Meds given in ED:  Medications  ketorolac (TORADOL) 30 MG/ML injection 30 mg (30 mg Intravenous Given 08/28/14 1806)  oxyCODONE-acetaminophen (PERCOCET/ROXICET) 5-325 MG per tablet 2 tablet (2 tablets Oral Given 08/28/14 1806)    New Prescriptions   IBUPROFEN (ADVIL,MOTRIN) 800 MG TABLET    Take 1 tablet (800 mg total) by mouth 3 (three) times daily.   OXYCODONE-ACETAMINOPHEN (PERCOCET) 5-325 MG PER TABLET    Take 1 tablet by mouth every 4 (four) hours as needed.   PROMETHAZINE (PHENERGAN) 25 MG TABLET    Take 1 tablet (25 mg total) by mouth every 6 (six) hours as needed for nausea or vomiting.   TAMSULOSIN (FLOMAX) 0.4 MG CAPS CAPSULE    Take 1  capsule (0.4 mg total) by mouth 2 (two) times daily.        Eber Hong, MD 08/28/14 1901

## 2014-08-28 NOTE — ED Notes (Signed)
Pt has pain on right flank area radiating to front right groin. Pt has painful urination.

## 2014-09-05 ENCOUNTER — Encounter: Payer: Self-pay | Admitting: Emergency Medicine

## 2014-09-05 ENCOUNTER — Emergency Department: Payer: Managed Care, Other (non HMO)

## 2014-09-05 ENCOUNTER — Emergency Department
Admission: EM | Admit: 2014-09-05 | Discharge: 2014-09-05 | Disposition: A | Discharge status: Home / Self Care | Payer: Managed Care, Other (non HMO) | Attending: Emergency Medicine | Admitting: Emergency Medicine

## 2014-09-05 DIAGNOSIS — Y9389 Activity, other specified: Secondary | ICD-10-CM | POA: Insufficient documentation

## 2014-09-05 DIAGNOSIS — Z79899 Other long term (current) drug therapy: Secondary | ICD-10-CM | POA: Diagnosis not present

## 2014-09-05 DIAGNOSIS — S79912A Unspecified injury of left hip, initial encounter: Secondary | ICD-10-CM | POA: Insufficient documentation

## 2014-09-05 DIAGNOSIS — Z87891 Personal history of nicotine dependence: Secondary | ICD-10-CM | POA: Diagnosis not present

## 2014-09-05 DIAGNOSIS — W010XXA Fall on same level from slipping, tripping and stumbling without subsequent striking against object, initial encounter: Secondary | ICD-10-CM | POA: Diagnosis not present

## 2014-09-05 DIAGNOSIS — M5442 Lumbago with sciatica, left side: Secondary | ICD-10-CM

## 2014-09-05 DIAGNOSIS — Y998 Other external cause status: Secondary | ICD-10-CM | POA: Insufficient documentation

## 2014-09-05 DIAGNOSIS — M25552 Pain in left hip: Secondary | ICD-10-CM

## 2014-09-05 DIAGNOSIS — I10 Essential (primary) hypertension: Secondary | ICD-10-CM | POA: Diagnosis not present

## 2014-09-05 DIAGNOSIS — W19XXXA Unspecified fall, initial encounter: Secondary | ICD-10-CM

## 2014-09-05 DIAGNOSIS — Y9289 Other specified places as the place of occurrence of the external cause: Secondary | ICD-10-CM | POA: Diagnosis not present

## 2014-09-05 MED ORDER — DIAZEPAM 5 MG PO TABS
ORAL_TABLET | ORAL | Status: AC
  Administered 2014-09-05: 5 mg via ORAL
  Filled 2014-09-05: qty 1

## 2014-09-05 MED ORDER — DIAZEPAM 2 MG PO TABS: 2.0000 mg | ORAL_TABLET | Freq: Three times a day (TID) | ORAL | Status: DC | PRN

## 2014-09-05 MED ORDER — TRAMADOL HCL 50 MG PO TABS
ORAL_TABLET | ORAL | Status: AC
  Administered 2014-09-05: 50 mg via ORAL
  Filled 2014-09-05: qty 1

## 2014-09-05 MED ORDER — TRAMADOL HCL 50 MG PO TABS: 50.0000 mg | ORAL_TABLET | Freq: Four times a day (QID) | ORAL | Status: DC | PRN

## 2014-09-05 MED ORDER — TRAMADOL HCL 50 MG PO TABS
50.0000 mg | ORAL_TABLET | Freq: Once | ORAL | Status: AC
  Administered 2014-09-05: 50 mg via ORAL

## 2014-09-05 MED ORDER — DIAZEPAM 5 MG PO TABS
5.0000 mg | ORAL_TABLET | Freq: Once | ORAL | Status: AC
  Administered 2014-09-05: 5 mg via ORAL

## 2014-09-05 NOTE — ED Provider Notes (Signed)
[facility] Emergency Department Provider Note  ____________________________________________  Time seen: Approximately 256 AM  I have reviewed the triage vital signs and the nursing notes.   HISTORY  Chief Complaint Hip Pain    HPI Jorge Nichols is a 24 y.o. male who comes in with hip pain after falling today. The patient reports that he tripped over his daughter's toys at approximate 2030. He has had bilateral hip replacements and was concerned about the pain he's had since then. The patient reports he has pain shooting down his left side and that his leg has been locking up. The patient reports that he has had some difficulty on his left side since he had his surgery done but today it was worse. The patient reports that he fell onto his left side. He also does have some mild back pain.Patient reports that his pain is 8 out of 10 in intensity. The patient's wife was concerned that his hip might of been out of socket or something may be broken so they came in for further evaluation not take anything for pain but didn't put any ice on his hip.   Past Medical History  Diagnosis Date  . Hypertension   . Hyperlipidemia   . Kidney stones   . Chronic headaches   . Anxiety     Patient Active Problem List   Diagnosis Date Noted  . Mixed dyslipidemia 09/09/2012  . Metabolic syndrome 09/09/2012  . Morbid obesity 09/09/2012  . Sleep disorder breathing 09/09/2012  . Family history of premature CAD 09/09/2012    Past Surgical History  Procedure Laterality Date  . Lithotripsy    . Joint replacement      Current Outpatient Rx  Name  Route  Sig  Dispense  Refill  . albuterol (PROVENTIL HFA;VENTOLIN HFA) 108 (90 BASE) MCG/ACT inhaler   Inhalation   Inhale 2 puffs into the lungs every 6 (six) hours as needed.         . ALPRAZolam (XANAX) 0.5 MG tablet   Oral   Take 0.5 mg by mouth at bedtime as needed for anxiety.          Marland Kitchen azithromycin (ZITHROMAX) 250  MG tablet   Oral   Take 250-500 mg by mouth See admin instructions. Take two tablets by mouth on day 1 then take one tablet daily on days 2 through 5 starting on 08/26/2014      0   . diazepam (VALIUM) 2 MG tablet   Oral   Take 1 tablet (2 mg total) by mouth every 8 (eight) hours as needed for muscle spasms.   12 tablet   0   . ibuprofen (ADVIL,MOTRIN) 800 MG tablet   Oral   Take 1 tablet (800 mg total) by mouth 3 (three) times daily.   21 tablet   0   . metoprolol (LOPRESSOR) 50 MG tablet   Oral   Take 50 mg by mouth 2 (two) times daily.         Marland Kitchen oxyCODONE-acetaminophen (PERCOCET) 5-325 MG per tablet   Oral   Take 1 tablet by mouth every 4 (four) hours as needed.   20 tablet   0   . promethazine (PHENERGAN) 25 MG tablet   Oral   Take 1 tablet (25 mg total) by mouth every 6 (six) hours as needed for nausea or vomiting.   12 tablet   0   . tamsulosin (FLOMAX) 0.4 MG CAPS capsule   Oral  Take 1 capsule (0.4 mg total) by mouth 2 (two) times daily.   10 capsule   0   . traMADol (ULTRAM) 50 MG tablet   Oral   Take 1 tablet (50 mg total) by mouth every 6 (six) hours as needed.   12 tablet   0   . zolpidem (AMBIEN) 10 MG tablet   Oral   Take 10 mg by mouth at bedtime as needed for sleep.       2     Allergies Review of patient's allergies indicates no known allergies.  Family History  Problem Relation Age of Onset  . Hypertension Father     Social History History  Substance Use Topics  . Smoking status: Former Smoker -- 0.50 packs/day    Quit date: 06/22/2014  . Smokeless tobacco: Not on file  . Alcohol Use: 3.0 oz/week    5 Cans of beer per week     Comment: occassionally    Review of Systems Constitutional: No fever/chills Eyes: No visual changes. ENT: No sore throat. Cardiovascular: Denies chest pain. Respiratory: Denies shortness of breath. Gastrointestinal: No abdominal pain.  No nausea, no vomiting.   Genitourinary: Negative for  dysuria. Musculoskeletal: Back pain and left hip pain. Skin: Negative for rash. Neurological: Negative for headaches,  10-point ROS otherwise negative.  ____________________________________________   PHYSICAL EXAM:  VITAL SIGNS: ED Triage Vitals  Enc Vitals Group     BP 09/05/14 0022 103/56 mmHg     Pulse Rate 09/05/14 0022 84     Resp 09/05/14 0022 20     Temp 09/05/14 0022 97.6 F (36.4 C)     Temp Source 09/05/14 0022 Oral     SpO2 09/05/14 0022 98 %     Weight 09/05/14 0022 250 lb (113.399 kg)     Height 09/05/14 0022  (1.727 m)     Head Cir --      Peak Flow --      Pain Score 09/05/14 0023 9     Pain Loc --      Pain Edu? --      Excl. in GC? --     Constitutional: Alert and oriented. Well appearing and in moderate distress. Eyes: Conjunctivae are normal. PERRL. EOMI. Head: Atraumatic. Nose: No congestion/rhinnorhea. Mouth/Throat: Mucous membranes are moist.  Oropharynx non-erythematous. Cardiovascular: Normal rate, regular rhythm. Grossly normal heart sounds.  Good peripheral circulation. Respiratory: Normal respiratory effort.  No retractions. Lungs CTAB. Gastrointestinal: Soft and nontender. No distention. Positive bowel sounds. Genitourinary: Deferred Musculoskeletal: Left hip pain worse with movement positive straight leg raise on the left. Low back pain and mild tenderness to palpation L5/S1 Neurologic:  Normal speech and language. No gross focal neurologic deficits are appreciated.  Skin:  Skin is warm, dry and intact. No rash noted. Psychiatric: Mood and affect are normal.   ____________________________________________   LABS (all labs ordered are listed, but only abnormal results are displayed)  Labs Reviewed - No data to display ____________________________________________  EKG  None ____________________________________________  RADIOLOGY  Left hip x-ray with pelvis: Bilateral hip arthroplasties, no acute bony  abnormalities ____________________________________________   PROCEDURES  Procedure(s) performed: None  Critical Care performed: No  ____________________________________________   INITIAL IMPRESSION / ASSESSMENT AND PLAN / ED COURSE  Pertinent labs & imaging results that were available during my care of the patient were reviewed by me and considered in my medical decision making (see chart for details).  This is a 24 year old male who comes in  today with some left-sided hip pain after tripping over his child's toys and falling on his left side. The patient does not have any broken bones apparent on x-ray. He does walk but it is with a limp. I will give the patient some Valium and some tramadol and have him follow back up with his orthopedic surgeon for further evaluation of his hip pain. I told the patient that he does need to use a cane to help with the walking so he is not putting excessive amounts of weight on that side. Otherwise the patient be discharged home to follow-up with his doctor. ____________________________________________   FINAL CLINICAL IMPRESSION(S) / ED DIAGNOSES  Final diagnoses:  Hip pain, left  Left-sided low back pain with left-sided sciatica      Rebecka Apley, MD 09/05/14 0403

## 2014-09-05 NOTE — ED Notes (Signed)
Pt ambulatory to triage with slow gait; pt reports being tripped by daughter at 9pm; c/o pain left hip since (hx hip replacement)

## 2014-09-05 NOTE — Discharge Instructions (Signed)
Back Pain, Adult Back pain is very common. The pain often gets better over time. The cause of back pain is usually not dangerous. Most people can learn to manage their back pain on their own.  HOME CARE   Stay active. Start with short walks on flat ground if you can. Try to walk farther each day.  Do not sit, drive, or stand in one place for more than 30 minutes. Do not stay in bed.  Do not avoid exercise or work. Activity can help your back heal faster.  Be careful when you bend or lift an object. Bend at your knees, keep the object close to you, and do not twist.  Sleep on a firm mattress. Lie on your side, and bend your knees. If you lie on your back, put a pillow under your knees.  Only take medicines as told by your doctor.  Put ice on the injured area.  Put ice in a plastic bag.  Place a towel between your skin and the bag.  Leave the ice on for 15-20 minutes, 03-04 times a day for the first 2 to 3 days. After that, you can switch between ice and heat packs.  Ask your doctor about back exercises or massage.  Avoid feeling anxious or stressed. Find good ways to deal with stress, such as exercise. GET HELP RIGHT AWAY IF:   Your pain does not go away with rest or medicine.  Your pain does not go away in 1 week.  You have new problems.  You do not feel well.  The pain spreads into your legs.  You cannot control when you poop (bowel movement) or pee (urinate).  Your arms or legs feel weak or lose feeling (numbness).  You feel sick to your stomach (nauseous) or throw up (vomit).  You have belly (abdominal) pain.  You feel like you may pass out (faint). MAKE SURE YOU:   Understand these instructions.  Will watch your condition.  Will get help right away if you are not doing well or get worse. Document Released: 08/26/2007 Document Revised: 06/01/2011 Document Reviewed: 07/11/2013 [facility] Patient Information 2015 [place], [place]. This information is not intended  to replace advice given to you by your health care provider. Make sure you discuss any questions you have with your health care provider.  Fall Prevention and Home Safety Falls cause injuries and can affect all age groups. It is possible to prevent falls.  HOW TO PREVENT FALLS  Wear shoes with rubber soles that do not have an opening for your toes.  Keep the inside and outside of your house well lit.  Use night lights throughout your home.  Remove clutter from floors.  Clean up floor spills.  Remove throw rugs or fasten them to the floor with carpet tape.  Do not place electrical cords across pathways.  Put grab bars by your tub, shower, and toilet. Do not use towel bars as grab bars.  Put handrails on both sides of the stairway. Fix loose handrails.  Do not climb on stools or stepladders, if possible.  Do not wax your floors.  Repair uneven or unsafe sidewalks, walkways, or stairs.  Keep items you use a lot within reach.  Be aware of pets.  Keep emergency numbers next to the telephone.  Put smoke detectors in your home and near bedrooms. Ask your doctor what other things you can do to prevent falls. Document Released: 01/03/2009 Document Revised: 09/08/2011 Document Reviewed: 06/09/2011 [employer] Patient Information 2015  [employer]. This information is not intended to replace advice given to you by your health care provider. Make sure you discuss any questions you have with your health care provider.  Hip Pain Your hip is the joint between your upper legs and your lower pelvis. The bones, cartilage, tendons, and muscles of your hip joint perform a lot of work each day supporting your body weight and allowing you to move around. Hip pain can range from a minor ache to severe pain in one or both of your hips. Pain may be felt on the inside of the hip joint near the groin, or the outside near the buttocks and upper thigh. You may have swelling or stiffness as well.  HOME  CARE INSTRUCTIONS   Take medicines only as directed by your health care provider.  Apply ice to the injured area:  Put ice in a plastic bag.  Place a towel between your skin and the bag.  Leave the ice on for 15-20 minutes at a time, 3-4 times a day.  Keep your leg raised (elevated) when possible to lessen swelling.  Avoid activities that cause pain.  Follow specific exercises as directed by your health care provider.  Sleep with a pillow between your legs on your most comfortable side.  Record how often you have hip pain, the location of the pain, and what it feels like. SEEK MEDICAL CARE IF:   You are unable to put weight on your leg.  Your hip is red or swollen or very tender to touch.  Your pain or swelling continues or worsens after 1 week.  You have increasing difficulty walking.  You have a fever. SEEK IMMEDIATE MEDICAL CARE IF:   You have fallen.  You have a sudden increase in pain and swelling in your hip. MAKE SURE YOU:   Understand these instructions.  Will watch your condition.  Will get help right away if you are not doing well or get worse. Document Released: 08/27/2009 Document Revised: 07/24/2013 Document Reviewed: 11/03/2012 [facility] Patient Information 2015 [place], [place]. This information is not intended to replace advice given to you by your health care provider. Make sure you discuss any questions you have with your health care provider.

## 2014-09-14 ENCOUNTER — Encounter (HOSPITAL_COMMUNITY): Payer: Self-pay | Admitting: Emergency Medicine

## 2014-09-14 ENCOUNTER — Emergency Department (HOSPITAL_COMMUNITY)
Admission: EM | Admit: 2014-09-14 | Discharge: 2014-09-14 | Disposition: A | Discharge status: Home / Self Care | Payer: Managed Care, Other (non HMO) | Attending: Emergency Medicine | Admitting: Emergency Medicine

## 2014-09-14 ENCOUNTER — Emergency Department (HOSPITAL_COMMUNITY): Payer: Managed Care, Other (non HMO)

## 2014-09-14 DIAGNOSIS — I10 Essential (primary) hypertension: Secondary | ICD-10-CM | POA: Insufficient documentation

## 2014-09-14 DIAGNOSIS — F419 Anxiety disorder, unspecified: Secondary | ICD-10-CM | POA: Diagnosis not present

## 2014-09-14 DIAGNOSIS — Z87891 Personal history of nicotine dependence: Secondary | ICD-10-CM | POA: Diagnosis not present

## 2014-09-14 DIAGNOSIS — Z8739 Personal history of other diseases of the musculoskeletal system and connective tissue: Secondary | ICD-10-CM | POA: Diagnosis not present

## 2014-09-14 DIAGNOSIS — R319 Hematuria, unspecified: Secondary | ICD-10-CM | POA: Diagnosis present

## 2014-09-14 DIAGNOSIS — N201 Calculus of ureter: Secondary | ICD-10-CM

## 2014-09-14 DIAGNOSIS — Z79899 Other long term (current) drug therapy: Secondary | ICD-10-CM | POA: Diagnosis not present

## 2014-09-14 LAB — CBC WITH DIFFERENTIAL/PLATELET
BASOS ABS: 0 10*3/uL (ref 0.0–0.1)
Basophils Relative: 1 % (ref 0–1)
EOS ABS: 0.3 10*3/uL (ref 0.0–0.7)
EOS PCT: 4 % (ref 0–5)
HEMATOCRIT: 41.7 % (ref 39.0–52.0)
Hemoglobin: 14.2 g/dL (ref 13.0–17.0)
LYMPHS ABS: 2 10*3/uL (ref 0.7–4.0)
LYMPHS PCT: 32 % (ref 12–46)
MCH: 32.4 pg (ref 26.0–34.0)
MCHC: 34.1 g/dL (ref 30.0–36.0)
MCV: 95.2 fL (ref 78.0–100.0)
MONO ABS: 0.6 10*3/uL (ref 0.1–1.0)
Monocytes Relative: 10 % (ref 3–12)
Neutro Abs: 3.3 10*3/uL (ref 1.7–7.7)
Neutrophils Relative %: 54 % (ref 43–77)
PLATELETS: 235 10*3/uL (ref 150–400)
RBC: 4.38 MIL/uL (ref 4.22–5.81)
RDW: 12.2 % (ref 11.5–15.5)
WBC: 6.2 10*3/uL (ref 4.0–10.5)

## 2014-09-14 LAB — URINALYSIS, ROUTINE W REFLEX MICROSCOPIC
BILIRUBIN URINE: NEGATIVE
GLUCOSE, UA: NEGATIVE mg/dL
HGB URINE DIPSTICK: NEGATIVE
Ketones, ur: NEGATIVE mg/dL
Leukocytes, UA: NEGATIVE
Nitrite: NEGATIVE
PH: 5.5 (ref 5.0–8.0)
Protein, ur: NEGATIVE mg/dL
SPECIFIC GRAVITY, URINE: 1.025 (ref 1.005–1.030)
Urobilinogen, UA: 0.2 mg/dL (ref 0.0–1.0)

## 2014-09-14 LAB — COMPREHENSIVE METABOLIC PANEL
ALBUMIN: 3.9 g/dL (ref 3.5–5.0)
ALT: 31 U/L (ref 17–63)
AST: 29 U/L (ref 15–41)
Alkaline Phosphatase: 62 U/L (ref 38–126)
Anion gap: 8 (ref 5–15)
BUN: 10 mg/dL (ref 6–20)
CALCIUM: 9 mg/dL (ref 8.9–10.3)
CO2: 25 mmol/L (ref 22–32)
Chloride: 106 mmol/L (ref 101–111)
Creatinine, Ser: 1.12 mg/dL (ref 0.61–1.24)
GFR calc non Af Amer: >60 mL/min (ref >60)
GLUCOSE: 98 mg/dL (ref 65–99)
Potassium: 3.8 mmol/L (ref 3.5–5.1)
SODIUM: 139 mmol/L (ref 135–145)
Total Bilirubin: 1 mg/dL (ref 0.3–1.2)
Total Protein: 6.8 g/dL (ref 6.5–8.1)

## 2014-09-14 LAB — LIPASE, BLOOD: LIPASE: 21 U/L — AB (ref 22–51)

## 2014-09-14 MED ORDER — PROMETHAZINE HCL 25 MG PO TABS: 25.0000 mg | ORAL_TABLET | Freq: Four times a day (QID) | ORAL | Status: DC | PRN

## 2014-09-14 MED ORDER — TAMSULOSIN HCL 0.4 MG PO CAPS: 0.4000 mg | ORAL_CAPSULE | Freq: Every day | ORAL | Status: DC

## 2014-09-14 MED ORDER — OXYCODONE-ACETAMINOPHEN 5-325 MG PO TABS: 1.0000 | ORAL_TABLET | ORAL | Status: DC | PRN

## 2014-09-14 MED ORDER — ONDANSETRON HCL 4 MG/2ML IJ SOLN
4.0000 mg | Freq: Once | INTRAMUSCULAR | Status: AC
  Administered 2014-09-14: 4 mg via INTRAVENOUS
  Filled 2014-09-14: qty 2

## 2014-09-14 MED ORDER — MORPHINE SULFATE 4 MG/ML IJ SOLN
4.0000 mg | Freq: Once | INTRAMUSCULAR | Status: AC
  Administered 2014-09-14: 4 mg via INTRAVENOUS
  Filled 2014-09-14: qty 1

## 2014-09-14 MED ORDER — HYDROMORPHONE HCL 1 MG/ML IJ SOLN
INTRAMUSCULAR | Status: DC
Start: 2014-09-14 — End: 2014-09-15
  Filled 2014-09-14: qty 1

## 2014-09-14 MED ORDER — SODIUM CHLORIDE 0.9 % IV SOLN
1000.0000 mL | Freq: Once | INTRAVENOUS | Status: AC
  Administered 2014-09-14: 1000 mL via INTRAVENOUS

## 2014-09-14 MED ORDER — SODIUM CHLORIDE 0.9 % IV SOLN
1000.0000 mL | INTRAVENOUS | Status: DC
  Administered 2014-09-14: 1000 mL via INTRAVENOUS

## 2014-09-14 MED ORDER — HYDROMORPHONE HCL 1 MG/ML IJ SOLN
1.0000 mg | Freq: Once | INTRAMUSCULAR | Status: AC
  Administered 2014-09-14: 1 mg via INTRAVENOUS

## 2014-09-14 NOTE — Discharge Instructions (Signed)

## 2014-09-14 NOTE — ED Notes (Signed)
Patient complaining of hematuria, dysuria, and groin pain x 4 days. States he was seen here 3 weeks ago and diagnosed with kidney stone. States pain returned 4 days ago.

## 2014-09-14 NOTE — ED Provider Notes (Signed)
CSN: [id number]     Arrival date & time 09/14/14  1619 History   First MD Initiated Contact with Patient 09/14/14 1643     Chief Complaint  Patient presents with  . Hematuria   HPI Patient presents to the emergency room with complaints of inability to urinate properly. The patient noticed symptoms about 4 days ago. He's had some discomfort with urination. Is also noticed some blood in his urine. Patient was seen in the emergency room 3 days ago and was diagnosed with a probable kidney stone based on his laboratory findings and exam. No imaging studies were performed. Patient states his symptoms have persisted and today he feels like he is having some difficulty urinating. Patient felt like he had to force the urine out. He feels like he is blocked up again. No fevers or chills. No vomiting or diarrhea. Past Medical History  Diagnosis Date  . Hypertension   . Hyperlipidemia   . Kidney stones   . Chronic headaches   . Anxiety    Past Surgical History  Procedure Laterality Date  . Lithotripsy    . Joint replacement     Family History  Problem Relation Age of Onset  . Hypertension Father    History  Substance Use Topics  . Smoking status: Former Smoker -- 0.50 packs/day    Quit date: 06/22/2014  . Smokeless tobacco: Not on file  . Alcohol Use: 3.0 oz/week    5 Cans of beer per week     Comment: occassionally    Review of Systems  All other systems reviewed and are negative.     Allergies  Review of patient's allergies indicates no known allergies.  Home Medications   Prior to Admission medications   Medication Sig Start Date End Date Taking? Authorizing Provider  ALPRAZolam Prudy Feeler) 0.5 MG tablet Take 0.5 mg by mouth at bedtime as needed for anxiety.    Yes Historical Provider, MD  metoprolol (LOPRESSOR) 50 MG tablet Take 50 mg by mouth 2 (two) times daily.   Yes Historical Provider, MD  traMADol (ULTRAM) 50 MG tablet Take 1 tablet (50 mg total) by mouth every 6 (six)  hours as needed. 09/05/14 09/05/15 Yes Rebecka Apley, MD  zolpidem (AMBIEN) 10 MG tablet Take 10 mg by mouth at bedtime as needed for sleep.  08/02/14  Yes Historical Provider, MD  albuterol (PROVENTIL HFA;VENTOLIN HFA) 108 (90 BASE) MCG/ACT inhaler Inhale 2 puffs into the lungs every 6 (six) hours as needed. 08/06/14   Historical Provider, MD  diazepam (VALIUM) 2 MG tablet Take 1 tablet (2 mg total) by mouth every 8 (eight) hours as needed for muscle spasms. Patient not taking: Reported on 09/14/2014 09/05/14 09/05/15  Rebecka Apley, MD  ibuprofen (ADVIL,MOTRIN) 800 MG tablet Take 1 tablet (800 mg total) by mouth 3 (three) times daily. Patient not taking: Reported on 09/14/2014 08/28/14   Eber Hong, MD  oxyCODONE-acetaminophen (PERCOCET) 5-325 MG per tablet Take 1 tablet by mouth every 4 (four) hours as needed. Patient not taking: Reported on 09/14/2014 08/28/14   Eber Hong, MD  promethazine (PHENERGAN) 25 MG tablet Take 1 tablet (25 mg total) by mouth every 6 (six) hours as needed for nausea or vomiting. Patient not taking: Reported on 09/14/2014 08/28/14   Eber Hong, MD  tamsulosin (FLOMAX) 0.4 MG CAPS capsule Take 1 capsule (0.4 mg total) by mouth 2 (two) times daily. Patient not taking: Reported on 09/14/2014 08/28/14   Eber Hong, MD   BP  134/91 mmHg  Pulse 76  Temp(Src) 98.1 F (36.7 C) (Oral)  Resp 16  Ht  (1.727 m)  Wt 250 lb (113.399 kg)  BMI 38.02 kg/m2  SpO2 100% Physical Exam  Constitutional: He appears well-developed and well-nourished. No distress.  HENT:  Head: Normocephalic and atraumatic.  Right Ear: External ear normal.  Left Ear: External ear normal.  Eyes: Conjunctivae are normal. Right eye exhibits no discharge. Left eye exhibits no discharge. No scleral icterus.  Neck: Neck supple. No tracheal deviation present.  Cardiovascular: Normal rate, regular rhythm and intact distal pulses.   Pulmonary/Chest: Effort normal and breath sounds normal. No stridor. No  respiratory distress. He has no wheezes. He has no rales.  Abdominal: Soft. Bowel sounds are normal. He exhibits no distension. There is no tenderness. There is no rebound and no guarding.  Musculoskeletal: He exhibits no edema or tenderness.  Neurological: He is alert. He has normal strength. No cranial nerve deficit (no facial droop, extraocular movements intact, no slurred speech) or sensory deficit. He exhibits normal muscle tone. He displays no seizure activity. Coordination normal.  Skin: Skin is warm and dry. No rash noted.  Psychiatric: He has a normal mood and affect.  Nursing note and vitals reviewed.   ED Course  Procedures (including critical care time) Labs Review Labs Reviewed  LIPASE, BLOOD - Abnormal; Notable for the following:    Lipase 21 (*)    All other components within normal limits  URINALYSIS, ROUTINE W REFLEX MICROSCOPIC (NOT AT [facility])  CBC WITH DIFFERENTIAL/PLATELET  COMPREHENSIVE METABOLIC PANEL    Imaging Review Ct Abdomen Pelvis Wo Contrast  09/14/2014   CLINICAL DATA:  Hematuria and dysuria.  History of kidney stones.  EXAM: CT ABDOMEN AND PELVIS WITHOUT CONTRAST  TECHNIQUE: Multidetector CT imaging of the abdomen and pelvis was performed following the standard protocol without IV contrast.  COMPARISON:  12/28/2013  FINDINGS: Lower chest: The lung bases appear clear. No pleural or pericardial effusion.  Hepatobiliary: There is no suspicious liver abnormality. The gallbladder appears normal. No biliary dilatation.  Pancreas: Normal appearance of the pancreas.  Spleen: Negative  Adrenals/Urinary Tract: Normal adrenal glands. Small bilateral renal calculi noted. The largest left renal stone measures 5 mm. The largest right renal calculus measures 4 mm, image 40/series 2. Within the pelvic portion of the right ureter there is a 5 mm stone, image 73/ series 2. No left ureteral calculi identified. Beam hardening artifact from hip arthroplasty devices diminishes bladder  detail.  Stomach/Bowel: The stomach is within normal limits. The small bowel loops have a normal course and caliber. No obstruction. Normal appearance of the colon.  Vascular/Lymphatic: Normal appearance of the abdominal aorta. No enlarged retroperitoneal or mesenteric adenopathy. No enlarged pelvic or inguinal lymph nodes.  Reproductive: The prostate gland and seminal vesicles are not visualized due to streak artifact from hip arthroplasty devices.  Other: There is no ascites or focal fluid collections within the abdomen or pelvis.  Musculoskeletal: No focal bone abnormality. Stress set no acute findings noted. Bilateral hip arthroplasty devices noted.  IMPRESSION: 1. Right ureteral calculus is identified measuring 5 mm. This does not result in significant hydronephrosis. 2. Bilateral renal calculi. 3. Bilateral hip arthroplasty.   Electronically Signed   By: Signa Kell M.D.   On: 09/14/2014 21:10    Medications  0.9 %  sodium chloride infusion (0 mLs Intravenous Stopped 09/14/14 2045)    Followed by  0.9 %  sodium chloride infusion (0 mLs Intravenous  Stopped 09/14/14 1923)  ondansetron (ZOFRAN) injection 4 mg (4 mg Intravenous Given 09/14/14 1753)  morphine 4 MG/ML injection 4 mg (4 mg Intravenous Given 09/14/14 1753)  HYDROmorphone (DILAUDID) injection 1 mg (1 mg Intravenous Given 09/14/14 1941)     MDM   Final diagnoses:  Ureteral stone    CT scan shows a 5 mm ureteral stone.  Pain controlled with treatment in the ED.  DC home with pain meds, flomax.  Urology follow up.    Linwood Dibbles, MD 09/14/14 2125

## 2014-09-14 NOTE — ED Notes (Signed)
Bladder scan revealed 19 ml of urine.

## 2014-09-17 MED FILL — Oxycodone w/ Acetaminophen Tab 5-325 MG: ORAL | Qty: 6 | Status: AC

## 2014-10-11 ENCOUNTER — Emergency Department: Payer: 59

## 2014-10-11 ENCOUNTER — Encounter: Payer: Self-pay | Admitting: Emergency Medicine

## 2014-10-11 ENCOUNTER — Emergency Department
Admission: EM | Admit: 2014-10-11 | Discharge: 2014-10-11 | Disposition: A | Discharge status: Home / Self Care | Payer: Self-pay | Attending: Emergency Medicine | Admitting: Emergency Medicine

## 2014-10-11 DIAGNOSIS — Z87891 Personal history of nicotine dependence: Secondary | ICD-10-CM | POA: Insufficient documentation

## 2014-10-11 DIAGNOSIS — N201 Calculus of ureter: Secondary | ICD-10-CM | POA: Insufficient documentation

## 2014-10-11 DIAGNOSIS — F419 Anxiety disorder, unspecified: Secondary | ICD-10-CM | POA: Insufficient documentation

## 2014-10-11 DIAGNOSIS — Z79899 Other long term (current) drug therapy: Secondary | ICD-10-CM | POA: Insufficient documentation

## 2014-10-11 DIAGNOSIS — I10 Essential (primary) hypertension: Secondary | ICD-10-CM | POA: Insufficient documentation

## 2014-10-11 DIAGNOSIS — R109 Unspecified abdominal pain: Secondary | ICD-10-CM | POA: Insufficient documentation

## 2014-10-11 LAB — URINALYSIS COMPLETE WITH MICROSCOPIC (ARMC ONLY)
BILIRUBIN URINE: NEGATIVE
Bacteria, UA: NONE SEEN
Glucose, UA: NEGATIVE mg/dL
Ketones, ur: NEGATIVE mg/dL
LEUKOCYTES UA: NEGATIVE
NITRITE: NEGATIVE
Protein, ur: 30 mg/dL — AB
SPECIFIC GRAVITY, URINE: 1.015 (ref 1.005–1.030)
pH: 6 (ref 5.0–8.0)

## 2014-10-11 LAB — CBC
HEMATOCRIT: 46.3 % (ref 40.0–52.0)
Hemoglobin: 16 g/dL (ref 13.0–18.0)
MCH: 33.3 pg (ref 26.0–34.0)
MCHC: 34.6 g/dL (ref 32.0–36.0)
MCV: 96.2 fL (ref 80.0–100.0)
PLATELETS: 277 10*3/uL (ref 150–440)
RBC: 4.81 MIL/uL (ref 4.40–5.90)
RDW: 13.1 % (ref 11.5–14.5)
WBC: 9.3 10*3/uL (ref 3.8–10.6)

## 2014-10-11 LAB — COMPREHENSIVE METABOLIC PANEL
ALT: 45 U/L (ref 17–63)
AST: 41 U/L (ref 15–41)
Albumin: 4.1 g/dL (ref 3.5–5.0)
Alkaline Phosphatase: 67 U/L (ref 38–126)
Anion gap: 9 (ref 5–15)
BILIRUBIN TOTAL: 0.3 mg/dL (ref 0.3–1.2)
BUN: 11 mg/dL (ref 6–20)
CO2: 23 mmol/L (ref 22–32)
CREATININE: 0.98 mg/dL (ref 0.61–1.24)
Calcium: 9.2 mg/dL (ref 8.9–10.3)
Chloride: 108 mmol/L (ref 101–111)
GFR calc Af Amer: >60 mL/min (ref >60)
GFR calc non Af Amer: >60 mL/min (ref >60)
Glucose, Bld: 110 mg/dL — ABNORMAL HIGH (ref 65–99)
Potassium: 4.1 mmol/L (ref 3.5–5.1)
Sodium: 140 mmol/L (ref 135–145)
Total Protein: 7.3 g/dL (ref 6.5–8.1)

## 2014-10-11 MED ORDER — HYDROMORPHONE HCL 1 MG/ML IJ SOLN: 1.0000 mg | Freq: Once | INTRAMUSCULAR | Status: AC

## 2014-10-11 MED ORDER — HYDROMORPHONE HCL 1 MG/ML IJ SOLN
1.0000 mg | Freq: Once | INTRAMUSCULAR | Status: AC
  Administered 2014-10-11: 1 mg via INTRAVENOUS

## 2014-10-11 MED ORDER — TAMSULOSIN HCL 0.4 MG PO CAPS
0.4000 mg | ORAL_CAPSULE | Freq: Once | ORAL | Status: AC
  Administered 2014-10-11: 0.4 mg via ORAL
  Filled 2014-10-11: qty 1

## 2014-10-11 MED ORDER — SODIUM CHLORIDE 0.9 % IV BOLUS (SEPSIS)
1000.0000 mL | Freq: Once | INTRAVENOUS | Status: AC
  Administered 2014-10-11: 1000 mL via INTRAVENOUS

## 2014-10-11 MED ORDER — TAMSULOSIN HCL 0.4 MG PO CAPS: 0.4000 mg | ORAL_CAPSULE | Freq: Every day | ORAL | Status: DC

## 2014-10-11 MED ORDER — ONDANSETRON HCL 4 MG/2ML IJ SOLN
4.0000 mg | Freq: Once | INTRAMUSCULAR | Status: AC
  Administered 2014-10-11: 4 mg via INTRAVENOUS

## 2014-10-11 MED ORDER — HYDROMORPHONE HCL 1 MG/ML IJ SOLN
INTRAMUSCULAR | Status: AC
  Administered 2014-10-11: 1 mg via INTRAVENOUS
  Filled 2014-10-11: qty 1

## 2014-10-11 MED ORDER — ONDANSETRON 4 MG PO TBDP: 4.0000 mg | ORAL_TABLET | Freq: Three times a day (TID) | ORAL | Status: DC | PRN

## 2014-10-11 MED ORDER — ONDANSETRON HCL 4 MG/2ML IJ SOLN
INTRAMUSCULAR | Status: AC
  Administered 2014-10-11: 4 mg via INTRAVENOUS
  Filled 2014-10-11: qty 2

## 2014-10-11 MED ORDER — KETOROLAC TROMETHAMINE 30 MG/ML IJ SOLN
30.0000 mg | INTRAMUSCULAR | Status: AC
  Administered 2014-10-11: 30 mg via INTRAVENOUS
  Filled 2014-10-11: qty 1

## 2014-10-11 MED ORDER — OXYCODONE-ACETAMINOPHEN 5-325 MG PO TABS: 1.0000 | ORAL_TABLET | Freq: Four times a day (QID) | ORAL | Status: DC | PRN

## 2014-10-11 NOTE — ED Notes (Signed)
Pt given urine strainer

## 2014-10-11 NOTE — ED Provider Notes (Signed)
[facility] Emergency Department Provider Note ____________________________________________  Time seen: Approximately 8:00 AM  I have reviewed the triage vital signs and the nursing notes.   HISTORY  Chief Complaint Groin Pain  HPI Jorge Nichols is a 24 y.o. male with right flank pain radiating into his right groin. Patient states that he has had multiple kidney stones in the past. Patient had a 5 mm meter kidney stone about one month ago in which she states that he never saw it pass. But since that time he quit hurting.Patient states that he is pain on a scale of 0-10 right now is about an 8. Patient says the pain is constant and it radiates into his right testicle. He denies any significant testicular swelling or penile problems. Patient also denies any significant fever, chills, nausea, vomiting, or change in his bowels. Patient denies any dysuria, frequency, or hematuria.   Past Medical History  Diagnosis Date  . Hypertension   . Hyperlipidemia   . Kidney stones   . Chronic headaches   . Anxiety     Patient Active Problem List   Diagnosis Date Noted  . Mixed dyslipidemia 09/09/2012  . Metabolic syndrome 09/09/2012  . Morbid obesity 09/09/2012  . Sleep disorder breathing 09/09/2012  . Family history of premature CAD 09/09/2012    Past Surgical History  Procedure Laterality Date  . Lithotripsy    . Joint replacement      Current Outpatient Rx  Name  Route  Sig  Dispense  Refill  . metoprolol (LOPRESSOR) 50 MG tablet   Oral   Take 50 mg by mouth 2 (two) times daily.         Marland Kitchen ALPRAZolam (XANAX) 0.5 MG tablet   Oral   Take 0.5 mg by mouth at bedtime as needed for anxiety.          . diazepam (VALIUM) 2 MG tablet   Oral   Take 1 tablet (2 mg total) by mouth every 8 (eight) hours as needed for muscle spasms. Patient not taking: Reported on 09/14/2014   12 tablet   0   . ibuprofen (ADVIL,MOTRIN) 800 MG tablet   Oral   Take 1 tablet  (800 mg total) by mouth 3 (three) times daily. Patient not taking: Reported on 09/14/2014   21 tablet   0   . ondansetron (ZOFRAN ODT) 4 MG disintegrating tablet   Oral   Take 1 tablet (4 mg total) by mouth every 8 (eight) hours as needed for nausea or vomiting.   8 tablet   0   . oxyCODONE-acetaminophen (ROXICET) 5-325 MG per tablet   Oral   Take 1 tablet by mouth every 6 (six) hours as needed.   20 tablet   0   . promethazine (PHENERGAN) 25 MG tablet   Oral   Take 1 tablet (25 mg total) by mouth every 6 (six) hours as needed for nausea or vomiting. Patient not taking: Reported on 10/11/2014   16 tablet   0   . tamsulosin (FLOMAX) 0.4 MG CAPS capsule   Oral   Take 1 capsule (0.4 mg total) by mouth daily.   14 capsule   0   . traMADol (ULTRAM) 50 MG tablet   Oral   Take 1 tablet (50 mg total) by mouth every 6 (six) hours as needed. Patient not taking: Reported on 10/11/2014   12 tablet   0   . zolpidem (AMBIEN) 10 MG tablet   Oral  Take 10 mg by mouth at bedtime as needed for sleep.       2     Allergies Review of patient's allergies indicates no known allergies.  Family History  Problem Relation Age of Onset  . Hypertension Father     Social History History  Substance Use Topics  . Smoking status: Former Smoker -- 0.50 packs/day    Quit date: 06/22/2014  . Smokeless tobacco: Not on file  . Alcohol Use: 3.0 oz/week    5 Cans of beer per week     Comment: occassionally    Review of Systems Constitutional: No fever/chills Eyes: No visual changes. ENT: No sore throat. Cardiovascular: Denies chest pain. Respiratory: Denies shortness of breath. Gastrointestinal: Pain in his right flank radiating down into his right groin since early this morning..  No nausea, no vomiting.  No diarrhea.  No constipation. Genitourinary: Negative for dysuria, frequency, or hematuria. Pain radiates into his right testicle. Musculoskeletal: Negative for back pain, but pain  is in his right flank. Skin: Negative for rash. Neurological: Negative for headaches, focal weakness or numbness.  10-point ROS otherwise negative.  ____________________________________________   PHYSICAL EXAM:  VITAL SIGNS: ED Triage Vitals  Enc Vitals Group     BP 10/11/14 0728 114/48 mmHg     Pulse Rate 10/11/14 0728 86     Resp 10/11/14 0728 20     Temp 10/11/14 0728 97.7 F (36.5 C)     Temp Source 10/11/14 0728 Oral     SpO2 10/11/14 0728 96 %     Weight 10/11/14 0724 240 lb (108.863 kg)     Height 10/11/14 0724 5\' 8"  (1.727 m)     Head Cir --      Peak Flow --      Pain Score 10/11/14 0724 8     Pain Loc --      Pain Edu? --      Excl. in GC? --     Constitutional: Alert and oriented. Well appearing and in obvious distress secondary to his pain. Eyes: Conjunctivae are normal. PERRL. EOMI. Head: Atraumatic. Nose: No congestion/rhinnorhea. Mouth/Throat: Mucous membranes are moist.  Oropharynx non-erythematous. Neck: No stridor.   Cardiovascular: Normal rate, regular rhythm. Grossly normal heart sounds.  Good peripheral circulation. Respiratory: Normal respiratory effort.  No retractions. Lungs CTAB. Gastrointestinal: Soft and only minimal tenderness to his right posterior lateral flank.. No distention. No abdominal bruits. No CVA tenderness. Musculoskeletal: No lower extremity tenderness nor edema.  No joint effusions. Neurologic:  Normal speech and language. No gross focal neurologic deficits are appreciated. No gait instability. Skin:  Skin is warm, dry and intact. No rash noted. Psychiatric: Mood and affect are normal. Speech and behavior are normal.  ____________________________________________   LABS (all labs ordered are listed, but only abnormal results are displayed)  Labs Reviewed  COMPREHENSIVE METABOLIC PANEL - Abnormal; Notable for the following:    Glucose, Bld 110 (*)    All other components within normal limits  URINALYSIS COMPLETEWITH  MICROSCOPIC ([facility] ONLY) - Abnormal; Notable for the following:    Color, Urine YELLOW (*)    APPearance CLEAR (*)    Hgb urine dipstick 3+ (*)    Protein, ur 30 (*)    Squamous Epithelial / LPF 0-5 (*)    All other components within normal limits  CBC   ____________________________________________  EKG none ____________________________________________  RADIOLOGY Ct Renal Stone Study  10/11/2014   CLINICAL DATA:  Groin pain for 2 days  with difficulty urinating today. History of kidney stones with lithotripsy. Initial encounter.  EXAM: CT ABDOMEN AND PELVIS WITHOUT CONTRAST  TECHNIQUE: Multidetector CT imaging of the abdomen and pelvis was performed following the standard protocol without IV contrast.  COMPARISON:  Abdominal pelvic CT 09/14/2014.  FINDINGS: Lower chest:  Unremarkable.  Hepatobiliary: The liver demonstrates decreased density consistent with steatosis. The entire liver is not imaged by this examination; its visualized portions are unremarkable. No evidence of gallstones, gallbladder wall thickening or biliary dilatation.  Pancreas: Unremarkable. No pancreatic ductal dilatation or surrounding inflammatory changes.  Spleen: Normal in size without focal abnormality.  Adrenals/Urinary Tract: Both adrenal glands appear normal.Numerous bilateral renal calculi are again demonstrated, unchanged in size an number from the recent study. Calculi measure up to 5 mm in the interpolar region of the right kidney. No evidence of hydronephrosis or perinephric soft tissue stranding. Distal right ureteral calculus has mildly progressed, measuring 6 mm on image 61, just proximal to the ureterovesical junction. The bladder is decompressed and suboptimally evaluated secondary to beam hardening artifact from the bilateral total hip arthroplasty. No definite bladder calculi.  Stomach/Bowel: No evidence of bowel wall thickening, distention or surrounding inflammatory change.The appendix appears normal.   Vascular/Lymphatic: There are no enlarged abdominal or pelvic lymph nodes. No significant vascular findings on noncontrast imaging.  Reproductive: Unremarkable.  Other: Small umbilical hernia containing only fat.  Musculoskeletal: No acute or significant osseous findings. Bilateral total hip arthroplasties.  IMPRESSION: 1. Interval distal progression of distal right ureteral calculus, now positioned just proximal to the ureterovesical junction. There is still no significant hydronephrosis or perinephric soft tissue stranding. 2. Numerous bilateral renal calculi. 3. Hepatic steatosis.   Electronically Signed   By: Carey Bullocks M.D.   On: 10/11/2014 09:20    ____________________________________________   PROCEDURES  Procedure(s) performed: None  Critical Care performed: No  ____________________________________________   INITIAL IMPRESSION / ASSESSMENT AND PLAN / ED COURSE  Pertinent labs & imaging results that were available during my care of the patient were reviewed by me and considered in my medical decision making (see chart for details).  ----------------------------------------- 8:21 AM on 10/11/2014 -----------------------------------------  Patient to get some pain and nausea medicine along with some IV fluids at this time. Patient will get labs and CT of the abdomen and pelvis to evaluate for kidney stone. ____________________________________________ ----------------------------------------- 11:14 AM on 10/11/2014 -----------------------------------------  Patient is feeling much improved after his IV fluids and pain meds. Patient was also given some IV Toradol and by mouth Flomax prior to discharge. Patient kidney stone has descended but is still just above and proximal to the UVJ. Patient states that he hasn't been able to see a urologist as he has not had any insurance. Patient is going to be given some pain and nausea medicines, he will still be referred to urology for  follow-up and he will also be placed on Flomax to help keep his urine flow 1. Patient was told to drink lots of fluids. Patient was told to return immediately if condition worsens.  FINAL CLINICAL IMPRESSION(S) / ED DIAGNOSES  Final diagnoses:  Right flank pain  Ureteral stone      Leona Carry, MD 10/29/14 [phone]

## 2014-10-11 NOTE — ED Notes (Signed)
Patient presents to the ED with groin pain that patient believes is due to a kidney stone.  Patient reports having pain x 2 days and being told he had a 5mm kidney stone 1 month ago.  Patient states he is having some difficulty urinating today.  Patient is grimacing and appears uncomfortable during triage.

## 2014-10-11 NOTE — ED Notes (Signed)
Pt states pain to bilateral groins, pt states dark urine and difficulty, pt states he was told he has a kidney stone last month

## 2014-10-17 ENCOUNTER — Encounter (HOSPITAL_COMMUNITY): Payer: Self-pay | Admitting: Emergency Medicine

## 2014-10-17 ENCOUNTER — Emergency Department (HOSPITAL_COMMUNITY)
Admission: EM | Admit: 2014-10-17 | Discharge: 2014-10-18 | Disposition: A | Discharge status: Home / Self Care | Payer: Self-pay | Attending: Emergency Medicine | Admitting: Emergency Medicine

## 2014-10-17 DIAGNOSIS — G8929 Other chronic pain: Secondary | ICD-10-CM | POA: Insufficient documentation

## 2014-10-17 DIAGNOSIS — R319 Hematuria, unspecified: Secondary | ICD-10-CM | POA: Insufficient documentation

## 2014-10-17 DIAGNOSIS — R110 Nausea: Secondary | ICD-10-CM | POA: Insufficient documentation

## 2014-10-17 DIAGNOSIS — Z87442 Personal history of urinary calculi: Secondary | ICD-10-CM | POA: Insufficient documentation

## 2014-10-17 DIAGNOSIS — F419 Anxiety disorder, unspecified: Secondary | ICD-10-CM | POA: Insufficient documentation

## 2014-10-17 DIAGNOSIS — Z8639 Personal history of other endocrine, nutritional and metabolic disease: Secondary | ICD-10-CM | POA: Insufficient documentation

## 2014-10-17 DIAGNOSIS — I10 Essential (primary) hypertension: Secondary | ICD-10-CM | POA: Insufficient documentation

## 2014-10-17 DIAGNOSIS — R109 Unspecified abdominal pain: Secondary | ICD-10-CM | POA: Insufficient documentation

## 2014-10-17 DIAGNOSIS — Z79899 Other long term (current) drug therapy: Secondary | ICD-10-CM | POA: Insufficient documentation

## 2014-10-17 DIAGNOSIS — Z87891 Personal history of nicotine dependence: Secondary | ICD-10-CM | POA: Insufficient documentation

## 2014-10-17 LAB — URINALYSIS, ROUTINE W REFLEX MICROSCOPIC
Bilirubin Urine: NEGATIVE
GLUCOSE, UA: NEGATIVE mg/dL
Ketones, ur: NEGATIVE mg/dL
Leukocytes, UA: NEGATIVE
Nitrite: NEGATIVE
PROTEIN: NEGATIVE mg/dL
Specific Gravity, Urine: <1.005 — ABNORMAL LOW (ref 1.005–1.030)
Urobilinogen, UA: 0.2 mg/dL (ref 0.0–1.0)
pH: 6 (ref 5.0–8.0)

## 2014-10-17 LAB — URINE MICROSCOPIC-ADD ON

## 2014-10-17 MED ORDER — KETOROLAC TROMETHAMINE 30 MG/ML IJ SOLN
60.0000 mg | Freq: Once | INTRAMUSCULAR | Status: AC
  Administered 2014-10-17: 60 mg via INTRAMUSCULAR
  Filled 2014-10-17: qty 2

## 2014-10-17 MED ORDER — ONDANSETRON 8 MG PO TBDP
8.0000 mg | ORAL_TABLET | Freq: Once | ORAL | Status: AC
Start: 2014-10-17 — End: 2014-10-17
  Administered 2014-10-17: 8 mg via ORAL
  Filled 2014-10-17: qty 1

## 2014-10-17 NOTE — ED Notes (Signed)
Pt c/o left flank pain x one month.

## 2014-10-17 NOTE — ED Notes (Signed)
Pt c/o left flank pain for the past month, states that he hurts everyday, some days are worse than others,

## 2014-10-18 NOTE — ED Notes (Signed)
Dr. Wickline at bedside.  

## 2014-10-18 NOTE — ED Provider Notes (Signed)
CSN: [id number]     Arrival date & time 10/17/14  2252 History   First MD Initiated Contact with Patient 10/17/14 2327     Chief Complaint  Patient presents with  . Flank Pain    Patient is a 24 y.o. male presenting with flank pain. The history is provided by the patient.  Flank Pain This is a chronic problem. The current episode started more than 1 week ago. The problem occurs daily. The problem has been gradually worsening. Pertinent negatives include no chest pain and no shortness of breath. Nothing aggravates the symptoms. Nothing relieves the symptoms.  pt reports he has had left flank pain for one month He reports long h/o kidney stones He also reports "groin pain" as well No fever No vomiting He reports nausea He has been unable to f/u with urologist He has had lithotripsy previously  Past Medical History  Diagnosis Date  . Hypertension   . Hyperlipidemia   . Kidney stones   . Chronic headaches   . Anxiety    Past Surgical History  Procedure Laterality Date  . Lithotripsy    . Joint replacement     Family History  Problem Relation Age of Onset  . Hypertension Father    History  Substance Use Topics  . Smoking status: Former Smoker -- 0.50 packs/day    Quit date: 06/22/2014  . Smokeless tobacco: Not on file  . Alcohol Use: 3.0 oz/week    5 Cans of beer per week     Comment: occassionally    Review of Systems  Constitutional: Negative for fever.  Respiratory: Negative for shortness of breath.   Cardiovascular: Negative for chest pain.  Genitourinary: Positive for flank pain. Negative for difficulty urinating.  All other systems reviewed and are negative.     Allergies  Review of patient's allergies indicates no known allergies.  Home Medications   Prior to Admission medications   Medication Sig Start Date End Date Taking? Authorizing Provider  ALPRAZolam Prudy Feeler) 0.5 MG tablet Take 0.5 mg by mouth at bedtime as needed for anxiety.     Historical  Provider, MD  diazepam (VALIUM) 2 MG tablet Take 1 tablet (2 mg total) by mouth every 8 (eight) hours as needed for muscle spasms. Patient not taking: Reported on 09/14/2014 09/05/14 09/05/15  Rebecka Apley, MD  ibuprofen (ADVIL,MOTRIN) 800 MG tablet Take 1 tablet (800 mg total) by mouth 3 (three) times daily. Patient not taking: Reported on 09/14/2014 08/28/14   Eber Hong, MD  metoprolol (LOPRESSOR) 50 MG tablet Take 50 mg by mouth 2 (two) times daily.    Historical Provider, MD  ondansetron (ZOFRAN ODT) 4 MG disintegrating tablet Take 1 tablet (4 mg total) by mouth every 8 (eight) hours as needed for nausea or vomiting. 10/11/14   Leona Carry, MD  oxyCODONE-acetaminophen (ROXICET) 5-325 MG per tablet Take 1 tablet by mouth every 6 (six) hours as needed. 10/11/14 10/11/15  Leona Carry, MD  promethazine (PHENERGAN) 25 MG tablet Take 1 tablet (25 mg total) by mouth every 6 (six) hours as needed for nausea or vomiting. Patient not taking: Reported on 10/11/2014 09/14/14   Linwood Dibbles, MD  tamsulosin (FLOMAX) 0.4 MG CAPS capsule Take 1 capsule (0.4 mg total) by mouth daily. 10/11/14   Leona Carry, MD  traMADol (ULTRAM) 50 MG tablet Take 1 tablet (50 mg total) by mouth every 6 (six) hours as needed. Patient not taking: Reported on 10/11/2014 09/05/14 09/05/15  Melchor Amour  Zenda Alpers, MD  zolpidem (AMBIEN) 10 MG tablet Take 10 mg by mouth at bedtime as needed for sleep.  08/02/14   Historical Provider, MD   BP 134/82 mmHg  Pulse 84  Temp(Src) 98.2 F (36.8 C) (Oral)  Resp 24  Ht  (1.727 m)  Wt 250 lb (113.399 kg)  BMI 38.02 kg/m2  SpO2 99% Physical Exam CONSTITUTIONAL: Well developed/well nourished HEAD: Normocephalic/atraumatic EYES: EOMI/PERRL ENMT: Mucous membranes moist NECK: supple no meningeal signs SPINE/BACK:entire spine nontender CV: S1/S2 noted, no murmurs/rubs/gallops noted LUNGS: Lungs are clear to auscultation bilaterally, no apparent distress ABDOMEN: soft, nontender, no  rebound or guarding, bowel sounds noted throughout abdomen GU:no cva tenderness, no scrotal tenderness/edema/erythema, no hernia noted, testicles descended bilaterally, male tech chaperone present NEURO: Pt is awake/alert/appropriate, moves all extremitiesx4.  No facial droop.   EXTREMITIES: pulses normal/equal, full ROM SKIN: warm, color normal PSYCH: no abnormalities of mood noted, alert and oriented to situation  ED Course  Procedures  Labs Review Labs Reviewed  URINALYSIS, ROUTINE W REFLEX MICROSCOPIC (NOT AT [facility]) - Abnormal; Notable for the following:    Color, Urine RED (*)    APPearance HAZY (*)    Specific Gravity, Urine <1.005 (*)    Hgb urine dipstick LARGE (*)    All other components within normal limits  URINE MICROSCOPIC-ADD ON   Pt with recent right ureteral stone on recent CT imaging Now with left flank pain for a month per patient U/a without signs of infection He is well appearing, no distress Advised need for urology f/u  Medications  ketorolac (TORADOL) 30 MG/ML injection 60 mg (60 mg Intramuscular Given 10/17/14 2353)  ondansetron (ZOFRAN-ODT) disintegrating tablet 8 mg (8 mg Oral Given 10/17/14 2353)    MDM   Final diagnoses:  Flank pain  Hematuria    Nursing notes including past medical history and social history reviewed and considered in documentation Labs/vital reviewed myself and considered during evaluation Previous records reviewed and considered     Zadie Rhine, MD 10/18/14 0011

## 2014-10-29 ENCOUNTER — Emergency Department (HOSPITAL_COMMUNITY)
Admission: EM | Admit: 2014-10-29 | Discharge: 2014-10-29 | Disposition: A | Discharge status: Home / Self Care | Payer: Self-pay | Attending: Emergency Medicine | Admitting: Emergency Medicine

## 2014-10-29 ENCOUNTER — Encounter (HOSPITAL_COMMUNITY): Payer: Self-pay | Admitting: Emergency Medicine

## 2014-10-29 DIAGNOSIS — F419 Anxiety disorder, unspecified: Secondary | ICD-10-CM | POA: Insufficient documentation

## 2014-10-29 DIAGNOSIS — G8929 Other chronic pain: Secondary | ICD-10-CM | POA: Insufficient documentation

## 2014-10-29 DIAGNOSIS — Z8639 Personal history of other endocrine, nutritional and metabolic disease: Secondary | ICD-10-CM | POA: Insufficient documentation

## 2014-10-29 DIAGNOSIS — I10 Essential (primary) hypertension: Secondary | ICD-10-CM | POA: Insufficient documentation

## 2014-10-29 DIAGNOSIS — Z87442 Personal history of urinary calculi: Secondary | ICD-10-CM | POA: Insufficient documentation

## 2014-10-29 DIAGNOSIS — N23 Unspecified renal colic: Secondary | ICD-10-CM | POA: Insufficient documentation

## 2014-10-29 DIAGNOSIS — Z87891 Personal history of nicotine dependence: Secondary | ICD-10-CM | POA: Insufficient documentation

## 2014-10-29 DIAGNOSIS — R319 Hematuria, unspecified: Secondary | ICD-10-CM | POA: Insufficient documentation

## 2014-10-29 DIAGNOSIS — Z79899 Other long term (current) drug therapy: Secondary | ICD-10-CM | POA: Insufficient documentation

## 2014-10-29 LAB — URINALYSIS, ROUTINE W REFLEX MICROSCOPIC
BILIRUBIN URINE: NEGATIVE
Glucose, UA: NEGATIVE mg/dL
Ketones, ur: NEGATIVE mg/dL
LEUKOCYTES UA: NEGATIVE
NITRITE: NEGATIVE
Specific Gravity, Urine: >1.03 — ABNORMAL HIGH (ref 1.005–1.030)
UROBILINOGEN UA: 0.2 mg/dL (ref 0.0–1.0)
pH: 6 (ref 5.0–8.0)

## 2014-10-29 LAB — CBC WITH DIFFERENTIAL/PLATELET
Basophils Absolute: 0.1 10*3/uL (ref 0.0–0.1)
Basophils Relative: 1 % (ref 0–1)
EOS ABS: 0.4 10*3/uL (ref 0.0–0.7)
Eosinophils Relative: 5 % (ref 0–5)
HCT: 44.5 % (ref 39.0–52.0)
HEMOGLOBIN: 15 g/dL (ref 13.0–17.0)
LYMPHS PCT: 29 % (ref 12–46)
Lymphs Abs: 2.4 10*3/uL (ref 0.7–4.0)
MCH: 32.7 pg (ref 26.0–34.0)
MCHC: 33.7 g/dL (ref 30.0–36.0)
MCV: 96.9 fL (ref 78.0–100.0)
Monocytes Absolute: 0.7 10*3/uL (ref 0.1–1.0)
Monocytes Relative: 9 % (ref 3–12)
NEUTROS ABS: 4.6 10*3/uL (ref 1.7–7.7)
Neutrophils Relative %: 56 % (ref 43–77)
Platelets: 276 10*3/uL (ref 150–400)
RBC: 4.59 MIL/uL (ref 4.22–5.81)
RDW: 12.6 % (ref 11.5–15.5)
WBC: 8.2 10*3/uL (ref 4.0–10.5)

## 2014-10-29 LAB — URINE MICROSCOPIC-ADD ON

## 2014-10-29 LAB — BASIC METABOLIC PANEL
Anion gap: 10 (ref 5–15)
BUN: 7 mg/dL (ref 6–20)
CALCIUM: 9.2 mg/dL (ref 8.9–10.3)
CO2: 25 mmol/L (ref 22–32)
Chloride: 105 mmol/L (ref 101–111)
Creatinine, Ser: 1.06 mg/dL (ref 0.61–1.24)
GFR calc Af Amer: >60 mL/min (ref >60)
GFR calc non Af Amer: >60 mL/min (ref >60)
GLUCOSE: 96 mg/dL (ref 65–99)
Potassium: 3.8 mmol/L (ref 3.5–5.1)
Sodium: 140 mmol/L (ref 135–145)

## 2014-10-29 MED ORDER — OXYCODONE-ACETAMINOPHEN 5-325 MG PO TABS: 2.0000 | ORAL_TABLET | ORAL | Status: DC | PRN

## 2014-10-29 MED ORDER — ONDANSETRON 4 MG PO TBDP: 4.0000 mg | ORAL_TABLET | Freq: Three times a day (TID) | ORAL | Status: DC | PRN

## 2014-10-29 MED ORDER — SODIUM CHLORIDE 0.9 % IV BOLUS (SEPSIS)
1000.0000 mL | Freq: Once | INTRAVENOUS | Status: AC
  Administered 2014-10-29: 1000 mL via INTRAVENOUS

## 2014-10-29 MED ORDER — HYDROMORPHONE HCL 1 MG/ML IJ SOLN
1.0000 mg | INTRAMUSCULAR | Status: DC | PRN
Start: 2014-10-29 — End: 2014-10-30
  Administered 2014-10-29: 1 mg via INTRAVENOUS
  Filled 2014-10-29: qty 1

## 2014-10-29 MED ORDER — TAMSULOSIN HCL 0.4 MG PO CAPS: 0.4000 mg | ORAL_CAPSULE | Freq: Every day | ORAL | Status: DC

## 2014-10-29 MED ORDER — TAMSULOSIN HCL 0.4 MG PO CAPS
0.4000 mg | ORAL_CAPSULE | Freq: Once | ORAL | Status: AC
  Administered 2014-10-29: 0.4 mg via ORAL
  Filled 2014-10-29: qty 1

## 2014-10-29 MED ORDER — KETOROLAC TROMETHAMINE 30 MG/ML IJ SOLN
30.0000 mg | Freq: Once | INTRAMUSCULAR | Status: AC
  Administered 2014-10-29: 30 mg via INTRAVENOUS
  Filled 2014-10-29: qty 1

## 2014-10-29 NOTE — ED Notes (Signed)
Pt has recent hx of 7mm stone - states he doesn't have insurance - so has not been to see urologist - Today was outside working on his truck when pain started back Rt flank and radiates around into his groin

## 2014-10-29 NOTE — Discharge Instructions (Signed)
Hematuria Hematuria is blood in your urine. It can be caused by a bladder infection, kidney infection, prostate infection, kidney stone, or cancer of your urinary tract. Infections can usually be treated with medicine, and a kidney stone usually will pass through your urine. If neither of these is the cause of your hematuria, further workup to find out the reason may be needed. It is very important that you tell your health care provider about any blood you see in your urine, even if the blood stops without treatment or happens without causing pain. Blood in your urine that happens and then stops and then happens again can be a symptom of a very serious condition. Also, pain is not a symptom in the initial stages of many urinary cancers. HOME CARE INSTRUCTIONS   Drink lots of fluid, 3-4 quarts a day. If you have been diagnosed with an infection, cranberry juice is especially recommended, in addition to large amounts of water.  Avoid caffeine, tea, and carbonated beverages because they tend to irritate the bladder.  Avoid alcohol because it may irritate the prostate.  Take all medicines as directed by your health care provider.  If you were prescribed an antibiotic medicine, finish it all even if you start to feel better.  If you have been diagnosed with a kidney stone, follow your health care provider's instructions regarding straining your urine to catch the stone.  Empty your bladder often. Avoid holding urine for long periods of time.  After a bowel movement, women should cleanse front to back. Use each tissue only once.  Empty your bladder before and after sexual intercourse if you are a male. SEEK MEDICAL CARE IF:  You develop back pain.  You have a fever.  You have a feeling of sickness in your stomach (nausea) or vomiting.  Your symptoms are not better in 3 days. Return sooner if you are getting worse. SEEK IMMEDIATE MEDICAL CARE IF:   You develop severe vomiting and are  unable to keep the medicine down.  You develop severe back or abdominal pain despite taking your medicines.  You begin passing a large amount of blood or clots in your urine.  You feel extremely weak or faint, or you pass out. MAKE SURE YOU:   Understand these instructions.  Will watch your condition.  Will get help right away if you are not doing well or get worse. Document Released: 03/09/2005 Document Revised: 07/24/2013 Document Reviewed: 11/07/2012 [facility] Patient Information 2015 [place], [place]. This information is not intended to replace advice given to you by your health care provider. Make sure you discuss any questions you have with your health care provider.  Ureteral Colic (Kidney Stones) Ureteral colic is the result of a condition when kidney stones form inside the kidney. Once kidney stones are formed they may move into the tube that connects the kidney with the bladder (ureter). If this occurs, this condition may cause pain (colic) in the ureter.  CAUSES  Pain is caused by stone movement in the ureter and the obstruction caused by the stone. SYMPTOMS  The pain comes and goes as the ureter contracts around the stone. The pain is usually intense, sharp, and stabbing in character. The location of the pain may move as the stone moves through the ureter. When the stone is near the kidney the pain is usually located in the back and radiates to the belly (abdomen). When the stone is ready to pass into the bladder the pain is often located in  the lower abdomen on the side the stone is located. At this location, the symptoms may mimic those of a urinary tract infection with urinary frequency. Once the stone is located here it often passes into the bladder and the pain disappears completely. °TREATMENT  °· Your caregiver will provide you with medicine for pain relief. °· You may require specialized follow-up X-rays. °· The absence of pain does not always mean that the stone has passed.  It may have just stopped moving. If the urine remains completely obstructed, it can cause loss of kidney function or even complete destruction of the involved kidney. It is your responsibility and in your interest that X-rays and follow-ups as suggested by your caregiver are completed. Relief of pain without passage of the stone can be associated with severe damage to the kidney, including loss of kidney function on that side. °· If your stone does not pass on its own, additional measures may be taken by your caregiver to ensure its removal. °HOME CARE INSTRUCTIONS  °· Increase your fluid intake. Water is the preferred fluid since juices containing vitamin C may acidify the urine making it less likely for certain stones (uric acid stones) to pass. °· Strain all urine. A strainer will be provided. Keep all particulate matter or stones for your caregiver to inspect. °· Take your pain medicine as directed. °· Make a follow-up appointment with your caregiver as directed. °· Remember that the goal is passage of your stone. The absence of pain does not mean the stone is gone. Follow your caregiver's instructions. °· Only take over-the-counter or prescription medicines for pain, discomfort, or fever as directed by your caregiver. °SEEK MEDICAL CARE IF:  °· Pain cannot be controlled with the prescribed medicine. °· You have a fever. °· Pain continues for longer than your caregiver advises it should. °· There is a change in the pain, and you develop chest discomfort or constant abdominal pain. °· You feel faint or pass out. °MAKE SURE YOU:  °· Understand these instructions. °· Will watch your condition. °· Will get help right away if you are not doing well or get worse. °Document Released: 12/17/2004 Document Revised: 07/04/2012 Document Reviewed: 09/03/2010 °ExitCare® Patient Information ©2015 [employer]. This information is not intended to replace advice given to you by your health care provider. Make sure you discuss  any questions you have with your health care provider. ° °

## 2014-10-30 NOTE — ED Provider Notes (Signed)
CSN: [id number]     Arrival date & time 10/29/14  1703 History   First MD Initiated Contact with Patient 10/29/14 1902     Chief Complaint  Patient presents with  . Flank Pain      HPI  She presents for evaluation of abdominal pain. Pain in his flank.  He states that it radiates  to his groin. Last CT 721 showed UVJ stone. Has been referred to urology. States he "can't afford it they will $250 up front". Continues with symptoms. Normal urine output. No hematuria, grossly. Mild nausea no vomiting no fever.  Past Medical History  Diagnosis Date  . Hypertension   . Hyperlipidemia   . Kidney stones   . Chronic headaches   . Anxiety    Past Surgical History  Procedure Laterality Date  . Lithotripsy    . Joint replacement     Family History  Problem Relation Age of Onset  . Hypertension Father    History  Substance Use Topics  . Smoking status: Former Smoker -- 0.50 packs/day    Quit date: 06/22/2014  . Smokeless tobacco: Current User    Types: Snuff  . Alcohol Use: 3.0 oz/week    5 Cans of beer per week     Comment: occassionally    Review of Systems  Constitutional: Negative for fever, chills, diaphoresis, appetite change and fatigue.  HENT: Negative for mouth sores, sore throat and trouble swallowing.   Eyes: Negative for visual disturbance.  Respiratory: Negative for cough, chest tightness, shortness of breath and wheezing.   Cardiovascular: Negative for chest pain.  Gastrointestinal: Negative for nausea, vomiting, abdominal pain, diarrhea and abdominal distention.  Endocrine: Negative for polydipsia, polyphagia and polyuria.  Genitourinary: Positive for flank pain. Negative for dysuria, frequency and hematuria.  Musculoskeletal: Negative for gait problem.  Skin: Negative for color change, pallor and rash.  Neurological: Negative for dizziness, syncope, light-headedness and headaches.  Hematological: Does not bruise/bleed easily.  Psychiatric/Behavioral: Negative  for behavioral problems and confusion.      Allergies  Review of patient's allergies indicates no known allergies.  Home Medications   Prior to Admission medications   Medication Sig Start Date End Date Taking? Authorizing Provider  ALPRAZolam Prudy Feeler) 0.5 MG tablet Take 0.5 mg by mouth at bedtime as needed for anxiety.    Yes Historical Provider, MD  metoprolol (LOPRESSOR) 50 MG tablet Take 50 mg by mouth 2 (two) times daily.   Yes Historical Provider, MD  diazepam (VALIUM) 2 MG tablet Take 1 tablet (2 mg total) by mouth every 8 (eight) hours as needed for muscle spasms. Patient not taking: Reported on 09/14/2014 09/05/14 09/05/15  Rebecka Apley, MD  ibuprofen (ADVIL,MOTRIN) 800 MG tablet Take 1 tablet (800 mg total) by mouth 3 (three) times daily. Patient not taking: Reported on 09/14/2014 08/28/14   Eber Hong, MD  ondansetron (ZOFRAN ODT) 4 MG disintegrating tablet Take 1 tablet (4 mg total) by mouth every 8 (eight) hours as needed for nausea. 10/29/14   Rolland Porter, MD  oxyCODONE-acetaminophen (PERCOCET/ROXICET) 5-325 MG per tablet Take 2 tablets by mouth every 4 (four) hours as needed. 10/29/14   Rolland Porter, MD  oxyCODONE-acetaminophen (PERCOCET/ROXICET) 5-325 MG per tablet Take 2 tablets by mouth every 4 (four) hours as needed. 10/29/14   Rolland Porter, MD  promethazine (PHENERGAN) 25 MG tablet Take 1 tablet (25 mg total) by mouth every 6 (six) hours as needed for nausea or vomiting. Patient not taking: Reported on 10/11/2014 09/14/14  Linwood Dibbles, MD  tamsulosin (FLOMAX) 0.4 MG CAPS capsule Take 1 capsule (0.4 mg total) by mouth daily. 10/29/14   Rolland Porter, MD  traMADol (ULTRAM) 50 MG tablet Take 1 tablet (50 mg total) by mouth every 6 (six) hours as needed. Patient not taking: Reported on 10/11/2014 09/05/14 09/05/15  Rebecka Apley, MD   BP 129/81 mmHg  Pulse 82  Temp(Src) 97.5 F (36.4 C) (Oral)  Resp 18  Ht  (1.727 m)  Wt 250 lb (113.399 kg)  BMI 38.02 kg/m2  SpO2 100% Physical  Exam  Constitutional: He is oriented to person, place, and time. He appears well-developed and well-nourished. No distress.  Appears uncomfortable. No reproducible tenderness on exam.  HENT:  Head: Normocephalic.  Eyes: Conjunctivae are normal. Pupils are equal, round, and reactive to light. No scleral icterus.  Neck: Normal range of motion. Neck supple. No thyromegaly present.  Cardiovascular: Normal rate and regular rhythm.  Exam reveals no gallop and no friction rub.   No murmur heard. Pulmonary/Chest: Effort normal and breath sounds normal. No respiratory distress. He has no wheezes. He has no rales.  Abdominal: Soft. Bowel sounds are normal. He exhibits no distension. There is no tenderness. There is no rebound.  Musculoskeletal: Normal range of motion.  Neurological: He is alert and oriented to person, place, and time.  Skin: Skin is warm and dry. No rash noted.  Psychiatric: He has a normal mood and affect. His behavior is normal.    ED Course  Procedures (including critical care time) Labs Review Labs Reviewed  URINALYSIS, ROUTINE W REFLEX MICROSCOPIC (NOT AT [facility]) - Abnormal; Notable for the following:    APPearance HAZY (*)    Specific Gravity, Urine >1.030 (*)    Hgb urine dipstick LARGE (*)    Protein, ur TRACE (*)    All other components within normal limits  URINE MICROSCOPIC-ADD ON - Abnormal; Notable for the following:    Bacteria, UA FEW (*)    Casts HYALINE CASTS (*)    All other components within normal limits  CBC WITH DIFFERENTIAL/PLATELET  BASIC METABOLIC PANEL    Imaging Review No results found.   EKG Interpretation None      MDM   Final diagnoses:  Ureteral colic  Hematuria    Hematuria noted. Last ct c UVJ stone, and additional renal stones. Plan sx treatment. Encourage Urology f/u.    Rolland Porter, MD 10/30/14 [phone]

## 2014-11-14 ENCOUNTER — Emergency Department (HOSPITAL_COMMUNITY)
Admission: EM | Admit: 2014-11-14 | Discharge: 2014-11-15 | Disposition: A | Discharge status: Home / Self Care | Payer: Self-pay | Attending: Emergency Medicine | Admitting: Emergency Medicine

## 2014-11-14 ENCOUNTER — Encounter (HOSPITAL_COMMUNITY): Payer: Self-pay | Admitting: Emergency Medicine

## 2014-11-14 DIAGNOSIS — N200 Calculus of kidney: Secondary | ICD-10-CM | POA: Insufficient documentation

## 2014-11-14 DIAGNOSIS — Z79899 Other long term (current) drug therapy: Secondary | ICD-10-CM | POA: Insufficient documentation

## 2014-11-14 DIAGNOSIS — Z87891 Personal history of nicotine dependence: Secondary | ICD-10-CM | POA: Insufficient documentation

## 2014-11-14 DIAGNOSIS — G8929 Other chronic pain: Secondary | ICD-10-CM | POA: Insufficient documentation

## 2014-11-14 DIAGNOSIS — Z8639 Personal history of other endocrine, nutritional and metabolic disease: Secondary | ICD-10-CM | POA: Insufficient documentation

## 2014-11-14 DIAGNOSIS — I10 Essential (primary) hypertension: Secondary | ICD-10-CM | POA: Insufficient documentation

## 2014-11-14 DIAGNOSIS — F419 Anxiety disorder, unspecified: Secondary | ICD-10-CM | POA: Insufficient documentation

## 2014-11-14 NOTE — ED Notes (Signed)
Pt c/o flank pain and has been seen for the same. Pt states he does not have money to see urologist.

## 2014-11-15 LAB — URINALYSIS, ROUTINE W REFLEX MICROSCOPIC
BILIRUBIN URINE: NEGATIVE
Glucose, UA: NEGATIVE mg/dL
Leukocytes, UA: NEGATIVE
Nitrite: NEGATIVE
PH: 5.5 (ref 5.0–8.0)
Protein, ur: NEGATIVE mg/dL
SPECIFIC GRAVITY, URINE: 1.025 (ref 1.005–1.030)
UROBILINOGEN UA: 0.2 mg/dL (ref 0.0–1.0)

## 2014-11-15 LAB — URINE MICROSCOPIC-ADD ON

## 2014-11-15 MED ORDER — PROMETHAZINE HCL 25 MG PO TABS: 25.0000 mg | ORAL_TABLET | Freq: Four times a day (QID) | ORAL | Status: DC | PRN

## 2014-11-15 MED ORDER — KETOROLAC TROMETHAMINE 60 MG/2ML IM SOLN
60.0000 mg | Freq: Once | INTRAMUSCULAR | Status: AC
  Administered 2014-11-15: 60 mg via INTRAMUSCULAR
  Filled 2014-11-15: qty 2

## 2014-11-15 MED ORDER — IBUPROFEN 800 MG PO TABS: 800.0000 mg | ORAL_TABLET | Freq: Three times a day (TID) | ORAL | Status: DC | PRN

## 2014-11-15 MED ORDER — HYDROMORPHONE HCL 1 MG/ML IJ SOLN
1.0000 mg | Freq: Once | INTRAMUSCULAR | Status: AC
  Administered 2014-11-15: 1 mg via INTRAMUSCULAR
  Filled 2014-11-15: qty 1

## 2014-11-15 MED ORDER — ONDANSETRON 4 MG PO TBDP
4.0000 mg | ORAL_TABLET | Freq: Once | ORAL | Status: AC
  Administered 2014-11-15: 4 mg via ORAL
  Filled 2014-11-15: qty 1

## 2014-11-15 MED ORDER — OXYCODONE-ACETAMINOPHEN 5-325 MG PO TABS: 1.0000 | ORAL_TABLET | ORAL | Status: DC | PRN

## 2014-11-15 NOTE — Discharge Instructions (Signed)

## 2014-11-15 NOTE — ED Provider Notes (Signed)
This chart was scribed for Jorge Maw Ward, DO by Arlan Organ, ED Scribe. This patient was seen in room [facility]04/[facility]04 and the patient's care was started 12:51 AM.   TIME SEEN: 12:51 AM   CHIEF COMPLAINT:  Chief Complaint  Patient presents with  . Flank Pain     HPI:  HPI Comments: Jorge Nichols is a 24 y.o. male with a PMHx of HTN, hyperlipidemia, and kidney stones who presents to the Emergency Department complaining of constant, ongoing, recurrent R sided flank pain x 2 days. Pain is described as sharp. He also reports dysuria, hematuria, and urinary frequency. No OTC/prescribed medications attempted prior to arrival. States he is out of medication for pain. Denies any discharge from penis. No testicular pain or swelling. Feels similar to his prior kidney stones. No recent fever, chills, chest pain, or shortness of breath. [name] was last evaluated 8/8 for same. Several previous CT scans performed showing a kidney stone in the R ureter. However, pt admits he has not been able to follow with Urology due to financial strain. States the pain has now moved into his groin. Has felt nauseated at home but no vomiting. No diarrhea. Denies any fever.   ROS: See HPI Constitutional: no fever  Eyes: no drainage  ENT: no runny nose   Cardiovascular:  no chest pain  Resp: no SOB  GI: no vomiting GU: Positive dysuria, hematuria, and frequency  Integumentary: no rash  Allergy: no hives  Musculoskeletal: no leg swelling  Neurological: no slurred speech ROS otherwise negative  PAST MEDICAL HISTORY/PAST SURGICAL HISTORY:  Past Medical History  Diagnosis Date  . Hypertension   . Hyperlipidemia   . Kidney stones   . Chronic headaches   . Anxiety     MEDICATIONS:  Prior to Admission medications   Medication Sig Start Date End Date Taking? Authorizing Provider  ALPRAZolam Prudy Feeler) 0.5 MG tablet Take 0.5 mg by mouth at bedtime as needed for anxiety.    Yes Historical Provider, MD  metoprolol  (LOPRESSOR) 50 MG tablet Take 50 mg by mouth 2 (two) times daily.   Yes Historical Provider, MD  diazepam (VALIUM) 2 MG tablet Take 1 tablet (2 mg total) by mouth every 8 (eight) hours as needed for muscle spasms. Patient not taking: Reported on 09/14/2014 09/05/14 09/05/15  Rebecka Apley, MD  ibuprofen (ADVIL,MOTRIN) 800 MG tablet Take 1 tablet (800 mg total) by mouth 3 (three) times daily. Patient not taking: Reported on 09/14/2014 08/28/14   Eber Hong, MD  ondansetron (ZOFRAN ODT) 4 MG disintegrating tablet Take 1 tablet (4 mg total) by mouth every 8 (eight) hours as needed for nausea. 10/29/14   Rolland Porter, MD  oxyCODONE-acetaminophen (PERCOCET/ROXICET) 5-325 MG per tablet Take 2 tablets by mouth every 4 (four) hours as needed. 10/29/14   Rolland Porter, MD  oxyCODONE-acetaminophen (PERCOCET/ROXICET) 5-325 MG per tablet Take 2 tablets by mouth every 4 (four) hours as needed. 10/29/14   Rolland Porter, MD  promethazine (PHENERGAN) 25 MG tablet Take 1 tablet (25 mg total) by mouth every 6 (six) hours as needed for nausea or vomiting. Patient not taking: Reported on 10/11/2014 09/14/14   Linwood Dibbles, MD  tamsulosin (FLOMAX) 0.4 MG CAPS capsule Take 1 capsule (0.4 mg total) by mouth daily. 10/29/14   Rolland Porter, MD  traMADol (ULTRAM) 50 MG tablet Take 1 tablet (50 mg total) by mouth every 6 (six) hours as needed. Patient not taking: Reported on 10/11/2014 09/05/14 09/05/15  Melchor Amour  Zenda Alpers, MD    ALLERGIES:  No Known Allergies  SOCIAL HISTORY:  Social History  Substance Use Topics  . Smoking status: Former Smoker -- 0.50 packs/day    Quit date: 06/22/2014  . Smokeless tobacco: Current User    Types: Snuff  . Alcohol Use: 3.0 oz/week    5 Cans of beer per week     Comment: occassionally    FAMILY HISTORY: Family History  Problem Relation Age of Onset  . Hypertension Father     EXAM: BP 133/91 mmHg  Pulse 95  Temp(Src) 97.6 F (36.4 C)  Resp 18  Ht  (1.727 m)  Wt 250 lb (113.399 kg)  BMI  38.02 kg/m2  SpO2 98% CONSTITUTIONAL: Alert and oriented and responds appropriately to questions. Well-appearing; Appears uncomfortable but non toxic well-nourished HEAD: Normocephalic EYES: Conjunctivae clear, PERRL ENT: normal nose; no rhinorrhea; moist mucous membranes; pharynx without lesions noted NECK: Supple, no meningismus, no LAD  CARD: RRR; S1 and S2 appreciated; no murmurs, no clicks, no rubs, no gallops RESP: Normal chest excursion without splinting or tachypnea; breath sounds clear and equal bilaterally; no wheezes, no rhonchi, no rales, no hypoxia or respiratory distress, speaking full sentences ABD/GI: Normal bowel sounds; non-distended; soft, non-tender, no rebound, no guarding, no peritoneal signs BACK:  The back appears normal and is non-tender to palpation, there is no CVA tenderness GU: Circumcised male; No blood or discharge at urethral meatus; no tesicular masses or tenderness; no scrotal masses or hernia appreciated, no high riding testicle EXT: Normal ROM in all joints; non-tender to palpation; no edema; normal capillary refill; no cyanosis, no calf tenderness or swelling    SKIN: Normal color for age and race; warm NEURO: Moves all extremities equally, sensation to light touch intact diffusely, cranial nerves II through XII intact PSYCH: The patient's mood and manner are appropriate. Grooming and personal hygiene are appropriate.  MEDICAL DECISION MAKING: Patient here with pain that has moved from his right flank and is now on his right groin. He has been seen several times in the past for similar pain and most recently had a CT scan on July 21 that showed 5 mm right ureteral stone. He is afebrile in the emergency department. Abdominal exam is benign. He is nontoxic appearing. Genital exam is normal. Urine shows large hemoglobin but no other sign of infection. His pain has been controlled with one dose of IM Dilaudid, IM Toradol. Zofran hasn't improved his nausea and he is  drinking without difficulty. Have urged patient to follow-up with a urologist. Have also provided him with primary care physician follow-up for further pain management given he is in the emergency department frequently. He has prescription for Flomax at home. Discussed usual and customary return precautions. He verbalizes understanding and is comfortable with plan.   I personally performed the services described in this documentation, which was scribed in my presence. The recorded information has been reviewed and is accurate.   Jorge Maw Ward, DO 11/15/14 [phone]

## 2014-11-21 MED FILL — Oxycodone w/ Acetaminophen Tab 5-325 MG: ORAL | Qty: 6 | Status: AC

## 2014-12-23 ENCOUNTER — Emergency Department: Payer: Self-pay

## 2014-12-23 ENCOUNTER — Emergency Department
Admission: EM | Admit: 2014-12-23 | Discharge: 2014-12-23 | Disposition: A | Discharge status: Home / Self Care | Payer: Self-pay | Attending: Emergency Medicine | Admitting: Emergency Medicine

## 2014-12-23 DIAGNOSIS — N200 Calculus of kidney: Secondary | ICD-10-CM | POA: Insufficient documentation

## 2014-12-23 DIAGNOSIS — Z87891 Personal history of nicotine dependence: Secondary | ICD-10-CM | POA: Insufficient documentation

## 2014-12-23 DIAGNOSIS — Z79899 Other long term (current) drug therapy: Secondary | ICD-10-CM | POA: Insufficient documentation

## 2014-12-23 DIAGNOSIS — R109 Unspecified abdominal pain: Secondary | ICD-10-CM

## 2014-12-23 DIAGNOSIS — R319 Hematuria, unspecified: Secondary | ICD-10-CM

## 2014-12-23 DIAGNOSIS — I10 Essential (primary) hypertension: Secondary | ICD-10-CM | POA: Insufficient documentation

## 2014-12-23 LAB — COMPREHENSIVE METABOLIC PANEL
ALT: 73 U/L — ABNORMAL HIGH (ref 17–63)
ANION GAP: 12 (ref 5–15)
AST: 43 U/L — ABNORMAL HIGH (ref 15–41)
Albumin: 4.4 g/dL (ref 3.5–5.0)
Alkaline Phosphatase: 71 U/L (ref 38–126)
BUN: 9 mg/dL (ref 6–20)
CHLORIDE: 106 mmol/L (ref 101–111)
CO2: 21 mmol/L — AB (ref 22–32)
Calcium: 9.3 mg/dL (ref 8.9–10.3)
Creatinine, Ser: 0.96 mg/dL (ref 0.61–1.24)
GFR calc non Af Amer: >60 mL/min (ref >60)
Glucose, Bld: 98 mg/dL (ref 65–99)
POTASSIUM: 3.9 mmol/L (ref 3.5–5.1)
SODIUM: 139 mmol/L (ref 135–145)
Total Bilirubin: 0.7 mg/dL (ref 0.3–1.2)
Total Protein: 7.6 g/dL (ref 6.5–8.1)

## 2014-12-23 LAB — URINALYSIS COMPLETE WITH MICROSCOPIC (ARMC ONLY)
BILIRUBIN URINE: NEGATIVE
Bacteria, UA: NONE SEEN
Glucose, UA: NEGATIVE mg/dL
LEUKOCYTES UA: NEGATIVE
Nitrite: NEGATIVE
PH: 5 (ref 5.0–8.0)
Protein, ur: NEGATIVE mg/dL
SQUAMOUS EPITHELIAL / LPF: NONE SEEN
Specific Gravity, Urine: 1.014 (ref 1.005–1.030)

## 2014-12-23 LAB — CBC
HCT: 48.3 % (ref 40.0–52.0)
Hemoglobin: 16.7 g/dL (ref 13.0–18.0)
MCH: 32.5 pg (ref 26.0–34.0)
MCHC: 34.5 g/dL (ref 32.0–36.0)
MCV: 94.2 fL (ref 80.0–100.0)
PLATELETS: 279 10*3/uL (ref 150–440)
RBC: 5.13 MIL/uL (ref 4.40–5.90)
RDW: 12.3 % (ref 11.5–14.5)
WBC: 9.3 10*3/uL (ref 3.8–10.6)

## 2014-12-23 MED ORDER — MORPHINE SULFATE (PF) 4 MG/ML IV SOLN
4.0000 mg | Freq: Once | INTRAVENOUS | Status: AC
  Administered 2014-12-23: 4 mg via INTRAVENOUS
  Filled 2014-12-23: qty 1

## 2014-12-23 MED ORDER — TAMSULOSIN HCL 0.4 MG PO CAPS
0.4000 mg | ORAL_CAPSULE | Freq: Once | ORAL | Status: AC
  Administered 2014-12-23: 0.4 mg via ORAL
  Filled 2014-12-23: qty 1

## 2014-12-23 MED ORDER — OXYCODONE-ACETAMINOPHEN 5-325 MG PO TABS
1.0000 | ORAL_TABLET | Freq: Once | ORAL | Status: AC
Start: 2014-12-23 — End: 2014-12-23
  Administered 2014-12-23: 1 via ORAL
  Filled 2014-12-23: qty 1

## 2014-12-23 MED ORDER — SODIUM CHLORIDE 0.9 % IV BOLUS (SEPSIS)
1000.0000 mL | Freq: Once | INTRAVENOUS | Status: AC
  Administered 2014-12-23: 1000 mL via INTRAVENOUS

## 2014-12-23 MED ORDER — TAMSULOSIN HCL 0.4 MG PO CAPS: 0.4000 mg | ORAL_CAPSULE | Freq: Every day | ORAL | Status: DC

## 2014-12-23 MED ORDER — OXYCODONE-ACETAMINOPHEN 5-325 MG PO TABS: 1.0000 | ORAL_TABLET | Freq: Four times a day (QID) | ORAL | Status: DC | PRN

## 2014-12-23 MED ORDER — KETOROLAC TROMETHAMINE 30 MG/ML IJ SOLN
30.0000 mg | Freq: Once | INTRAMUSCULAR | Status: AC
  Administered 2014-12-23: 30 mg via INTRAVENOUS
  Filled 2014-12-23: qty 1

## 2014-12-23 MED ORDER — ONDANSETRON HCL 4 MG/2ML IJ SOLN
4.0000 mg | Freq: Once | INTRAMUSCULAR | Status: AC
  Administered 2014-12-23: 4 mg via INTRAVENOUS
  Filled 2014-12-23: qty 2

## 2014-12-23 NOTE — Discharge Instructions (Signed)
Hematuria Hematuria is blood in your urine. It can be caused by a bladder infection, kidney infection, prostate infection, kidney stone, or cancer of your urinary tract. Infections can usually be treated with medicine, and a kidney stone usually will pass through your urine. If neither of these is the cause of your hematuria, further workup to find out the reason may be needed. It is very important that you tell your health care provider about any blood you see in your urine, even if the blood stops without treatment or happens without causing pain. Blood in your urine that happens and then stops and then happens again can be a symptom of a very serious condition. Also, pain is not a symptom in the initial stages of many urinary cancers. HOME CARE INSTRUCTIONS   Drink lots of fluid, 3-4 quarts a day. If you have been diagnosed with an infection, cranberry juice is especially recommended, in addition to large amounts of water.  Avoid caffeine, tea, and carbonated beverages because they tend to irritate the bladder.  Avoid alcohol because it may irritate the prostate.  Take all medicines as directed by your health care provider.  If you were prescribed an antibiotic medicine, finish it all even if you start to feel better.  If you have been diagnosed with a kidney stone, follow your health care provider's instructions regarding straining your urine to catch the stone.  Empty your bladder often. Avoid holding urine for long periods of time.  After a bowel movement, women should cleanse front to back. Use each tissue only once.  Empty your bladder before and after sexual intercourse if you are a male. SEEK MEDICAL CARE IF:  You develop back pain.  You have a fever.  You have a feeling of sickness in your stomach (nausea) or vomiting.  Your symptoms are not better in 3 days. Return sooner if you are getting worse. SEEK IMMEDIATE MEDICAL CARE IF:   You develop severe vomiting and are  unable to keep the medicine down.  You develop severe back or abdominal pain despite taking your medicines.  You begin passing a large amount of blood or clots in your urine.  You feel extremely weak or faint, or you pass out. MAKE SURE YOU:   Understand these instructions.  Will watch your condition.  Will get help right away if you are not doing well or get worse. Document Released: 03/09/2005 Document Revised: 07/24/2013 Document Reviewed: 11/07/2012 [facility] Patient Information 2015 [place], [place]. This information is not intended to replace advice given to you by your health care provider. Make sure you discuss any questions you have with your health care provider.  Flank Pain Flank pain refers to pain that is located on the side of the body between the upper abdomen and the back. The pain may occur over a short period of time (acute) or may be long-term or reoccurring (chronic). It may be mild or severe. Flank pain can be caused by many things. CAUSES  Some of the more common causes of flank pain include:  Muscle strains.   Muscle spasms.   A disease of your spine (vertebral disk disease).   A lung infection (pneumonia).   Fluid around your lungs (pulmonary edema).   A kidney infection.   Kidney stones.   A very painful skin rash caused by the chickenpox virus (shingles).   Gallbladder disease.  HOME CARE INSTRUCTIONS  Home care will depend on the cause of your pain. In general,  Rest as  directed by your caregiver.  Drink enough fluids to keep your urine clear or pale yellow.  Only take over-the-counter or prescription medicines as directed by your caregiver. Some medicines may help relieve the pain.  Tell your caregiver about any changes in your pain.  Follow up with your caregiver as directed. SEEK IMMEDIATE MEDICAL CARE IF:   Your pain is not controlled with medicine.   You have new or worsening symptoms.  Your pain increases.   You have  abdominal pain.   You have shortness of breath.   You have persistent nausea or vomiting.   You have swelling in your abdomen.   You feel faint or pass out.   You have blood in your urine.  You have a fever or persistent symptoms for more than 2-3 days.  You have a fever and your symptoms suddenly get worse. MAKE SURE YOU:   Understand these instructions.  Will watch your condition.  Will get help right away if you are not doing well or get worse. Document Released: 04/30/2005 Document Revised: 12/02/2011 Document Reviewed: 10/22/2011 [facility] Patient Information 2015 [place], [place]. This information is not intended to replace advice given to you by your health care provider. Make sure you discuss any questions you have with your health care provider.  Kidney Stones Kidney stones (urolithiasis) are deposits that form inside your kidneys. The intense pain is caused by the stone moving through the urinary tract. When the stone moves, the ureter goes into spasm around the stone. The stone is usually passed in the urine.  CAUSES   A disorder that makes certain neck glands produce too much parathyroid hormone (primary hyperparathyroidism).  A buildup of uric acid crystals, similar to gout in your joints.  Narrowing (stricture) of the ureter.  A kidney obstruction present at birth (congenital obstruction).  Previous surgery on the kidney or ureters.  Numerous kidney infections. SYMPTOMS   Feeling sick to your stomach (nauseous).  Throwing up (vomiting).  Blood in the urine (hematuria).  Pain that usually spreads (radiates) to the groin.  Frequency or urgency of urination. DIAGNOSIS   Taking a history and physical exam.  Blood or urine tests.  CT scan.  Occasionally, an examination of the inside of the urinary bladder (cystoscopy) is performed. TREATMENT   Observation.  Increasing your fluid intake.  Extracorporeal shock wave lithotripsy--This is a  noninvasive procedure that uses shock waves to break up kidney stones.  Surgery may be needed if you have severe pain or persistent obstruction. There are various surgical procedures. Most of the procedures are performed with the use of small instruments. Only small incisions are needed to accommodate these instruments, so recovery time is minimized. The size, location, and chemical composition are all important variables that will determine the proper choice of action for you. Talk to your health care provider to better understand your situation so that you will minimize the risk of injury to yourself and your kidney.  HOME CARE INSTRUCTIONS   Drink enough water and fluids to keep your urine clear or pale yellow. This will help you to pass the stone or stone fragments.  Strain all urine through the provided strainer. Keep all particulate matter and stones for your health care provider to see. The stone causing the pain may be as small as a grain of salt. It is very important to use the strainer each and every time you pass your urine. The collection of your stone will allow your health care provider to  analyze it and verify that a stone has actually passed. The stone analysis will often identify what you can do to reduce the incidence of recurrences.  Only take over-the-counter or prescription medicines for pain, discomfort, or fever as directed by your health care provider.  Make a follow-up appointment with your health care provider as directed.  Get follow-up X-rays if required. The absence of pain does not always mean that the stone has passed. It may have only stopped moving. If the urine remains completely obstructed, it can cause loss of kidney function or even complete destruction of the kidney. It is your responsibility to make sure X-rays and follow-ups are completed. Ultrasounds of the kidney can show blockages and the status of the kidney. Ultrasounds are not associated with any radiation  and can be performed easily in a matter of minutes. SEEK MEDICAL CARE IF:  You experience pain that is progressive and unresponsive to any pain medicine you have been prescribed. SEEK IMMEDIATE MEDICAL CARE IF:   Pain cannot be controlled with the prescribed medicine.  You have a fever or shaking chills.  The severity or intensity of pain increases over 18 hours and is not relieved by pain medicine.  You develop a new onset of abdominal pain.  You feel faint or pass out.  You are unable to urinate. MAKE SURE YOU:   Understand these instructions.  Will watch your condition.  Will get help right away if you are not doing well or get worse. Document Released: 03/09/2005 Document Revised: 11/09/2012 Document Reviewed: 08/10/2012 [facility] Patient Information 2015 [place], [place]. This information is not intended to replace advice given to you by your health care provider. Make sure you discuss any questions you have with your health care provider.

## 2014-12-23 NOTE — ED Provider Notes (Signed)
[facility] Emergency Department Provider Note  ____________________________________________  Time seen: Approximately 300 AM  I have reviewed the triage vital signs and the nursing notes.   HISTORY  Chief Complaint Flank Pain    HPI Jorge Nichols is a 24 y.o. male with a history of kidney stones who comes in with some right-sided flank pain radiating to his groin. The patient reports that he has been hurting for a couple of days but has been worse last couple of hours. He reports that the pain is mostly in his groin at this time. The patient did not take anything for pain and has not been able to see a urologist due to lack of insurance. The patient reports that he noticed some blood in his urine and noticed a blood clot this morning. The patient denies any vomiting or chest pain or shortness of breathbut has had some nausea. The patient reports that the pain has moved into his right groin. He has had multiple episodes as well as multiple visits for kidney stones both here and at [facility]. A chest pain as a 9 out of 10 in intensity. It feels just like his previous stones. Nothing seems to make it better or worse.   Past Medical History  Diagnosis Date  . Hypertension   . Hyperlipidemia   . Kidney stones   . Chronic headaches   . Anxiety     Patient Active Problem List   Diagnosis Date Noted  . Mixed dyslipidemia 09/09/2012  . Metabolic syndrome 09/09/2012  . Morbid obesity (HCC) 09/09/2012  . Sleep disorder breathing 09/09/2012  . Family history of premature CAD 09/09/2012    Past Surgical History  Procedure Laterality Date  . Lithotripsy    . Joint replacement      Current Outpatient Rx  Name  Route  Sig  Dispense  Refill  . ALPRAZolam (XANAX) 0.5 MG tablet   Oral   Take 0.5 mg by mouth at bedtime as needed for anxiety.          Marland Kitchen ibuprofen (ADVIL,MOTRIN) 800 MG tablet   Oral   Take 1 tablet (800 mg total) by mouth every 8 (eight)  hours as needed for mild pain.   30 tablet   0   . metoprolol (LOPRESSOR) 50 MG tablet   Oral   Take 50 mg by mouth 2 (two) times daily.         Marland Kitchen oxyCODONE-acetaminophen (PERCOCET/ROXICET) 5-325 MG per tablet   Oral   Take 1 tablet by mouth every 4 (four) hours as needed.   15 tablet   0   . oxyCODONE-acetaminophen (ROXICET) 5-325 MG tablet   Oral   Take 1 tablet by mouth every 6 (six) hours as needed.   12 tablet   0   . promethazine (PHENERGAN) 25 MG tablet   Oral   Take 1 tablet (25 mg total) by mouth every 6 (six) hours as needed for nausea or vomiting. Patient not taking: Reported on 12/23/2014   15 tablet   0   . tamsulosin (FLOMAX) 0.4 MG CAPS capsule   Oral   Take 1 capsule (0.4 mg total) by mouth daily. Patient not taking: Reported on 12/23/2014   7 capsule   0   . tamsulosin (FLOMAX) 0.4 MG CAPS capsule   Oral   Take 1 capsule (0.4 mg total) by mouth daily.   7 capsule   0     Allergies Review of patient's allergies  indicates no known allergies.  Family History  Problem Relation Age of Onset  . Hypertension Father     Social History Social History  Substance Use Topics  . Smoking status: Former Smoker -- 0.50 packs/day    Quit date: 06/22/2014  . Smokeless tobacco: Current User    Types: Snuff  . Alcohol Use: 3.0 oz/week    5 Cans of beer per week     Comment: occassionally    Review of Systems Constitutional: No fever/chills Eyes: No visual changes. ENT: No sore throat. Cardiovascular: Denies chest pain. Respiratory: Denies shortness of breath. Gastrointestinal: No abdominal pain.  No nausea, no vomiting.  No diarrhea.  No constipation. Genitourinary: Hematuria, right groin pain Musculoskeletal: Right flank pain Skin: Negative for rash. Neurological: Negative for headaches, focal weakness or numbness.  10-point ROS otherwise negative.  ____________________________________________   PHYSICAL EXAM:  VITAL SIGNS: ED Triage  Vitals  Enc Vitals Group     BP 12/23/14 0213 131/86 mmHg     Pulse Rate 12/23/14 0213 102     Resp 12/23/14 0213 20     Temp 12/23/14 0213 98.4 F (36.9 C)     Temp Source 12/23/14 0213 Oral     SpO2 12/23/14 0213 95 %     Weight 12/23/14 0213 250 lb (113.399 kg)     Height 12/23/14 0213  (1.727 m)     Head Cir --      Peak Flow --      Pain Score 12/23/14 0214 9     Pain Loc --      Pain Edu? --      Excl. in GC? --     Constitutional: Alert and oriented. Well appearing and in moderate distress. Eyes: Conjunctivae are normal. PERRL. EOMI. Head: Atraumatic. Nose: No congestion/rhinnorhea. Mouth/Throat: Mucous membranes are moist.  Oropharynx non-erythematous. Cardiovascular: Normal rate, regular rhythm. Grossly normal heart sounds.  Good peripheral circulation. Respiratory: Normal respiratory effort.  No retractions. Lungs CTAB. Gastrointestinal: Soft with right side pain as well as some groin pain, no CVA tenderness currently, No distention. Positive bowel sounds Musculoskeletal: No lower extremity tenderness nor edema.   Neurologic:  Normal speech and language.  Skin:  Skin is warm, dry and intact.  Psychiatric: Mood and affect are normal. .  ____________________________________________   LABS (all labs ordered are listed, but only abnormal results are displayed)  Labs Reviewed  URINALYSIS COMPLETEWITH MICROSCOPIC ([facility] ONLY) - Abnormal; Notable for the following:    Color, Urine YELLOW (*)    APPearance CLEAR (*)    Ketones, ur TRACE (*)    Hgb urine dipstick 3+ (*)    All other components within normal limits  COMPREHENSIVE METABOLIC PANEL - Abnormal; Notable for the following:    CO2 21 (*)    AST 43 (*)    ALT 73 (*)    All other components within normal limits  CBC   ____________________________________________  EKG  None ____________________________________________  RADIOLOGY  Ultrasound renal: Bilateral nonobstructing nephrolithiasis  measuring up to 6 mm ____________________________________________   PROCEDURES  Procedure(s) performed: None  Critical Care performed: No  ____________________________________________   INITIAL IMPRESSION / ASSESSMENT AND PLAN / ED COURSE  Pertinent labs & imaging results that were available during my care of the patient were reviewed by me and considered in my medical decision making (see chart for details).  This is a 30 rolled male with a history of kidney stones who comes in today with right-sided flank pain  that has radiated down to his groin and has been moving throughout the night. The patient has had some hematuria as well. Patient has had multiple CT scans this year showing his history of stones. I did an ultrasound to help decrease the patient's amount of radiation. The patient also does have 2 numerous to count rbc's and white blood cells in his urine with no bacteria. I gave the patient a dose of Toradol, normal saline IV, Zofran and morphine. The patient's pain did improve after some morphine but he reports that the pain worsened after he urinated. I will give the patient another dose of morphine as well as a liter of normal saline and reassess him afterwards.  The patient reports that his pain is improved. He will be discharged home to follow-up with urology. I will give him some more Flomax and Percocet and have him follow-up. ____________________________________________   FINAL CLINICAL IMPRESSION(S) / ED DIAGNOSES  Final diagnoses:  Hematuria  Kidney stones      Rebecka Apley, MD 12/23/14 [phone]

## 2014-12-23 NOTE — ED Notes (Signed)
Patient reports right flank pain that radiates into right groin "for couple days".  Patient pale and looks uncomfortable.

## 2015-02-19 ENCOUNTER — Encounter (HOSPITAL_COMMUNITY): Payer: Self-pay | Admitting: Emergency Medicine

## 2015-02-19 ENCOUNTER — Emergency Department (HOSPITAL_COMMUNITY)
Admission: EM | Admit: 2015-02-19 | Discharge: 2015-02-19 | Disposition: A | Discharge status: Home / Self Care | Payer: Self-pay | Attending: Emergency Medicine | Admitting: Emergency Medicine

## 2015-02-19 DIAGNOSIS — Z87891 Personal history of nicotine dependence: Secondary | ICD-10-CM | POA: Insufficient documentation

## 2015-02-19 DIAGNOSIS — Z87442 Personal history of urinary calculi: Secondary | ICD-10-CM | POA: Insufficient documentation

## 2015-02-19 DIAGNOSIS — Z79899 Other long term (current) drug therapy: Secondary | ICD-10-CM | POA: Insufficient documentation

## 2015-02-19 DIAGNOSIS — N23 Unspecified renal colic: Secondary | ICD-10-CM | POA: Insufficient documentation

## 2015-02-19 DIAGNOSIS — F419 Anxiety disorder, unspecified: Secondary | ICD-10-CM | POA: Insufficient documentation

## 2015-02-19 DIAGNOSIS — G8929 Other chronic pain: Secondary | ICD-10-CM | POA: Insufficient documentation

## 2015-02-19 DIAGNOSIS — Z8639 Personal history of other endocrine, nutritional and metabolic disease: Secondary | ICD-10-CM | POA: Insufficient documentation

## 2015-02-19 DIAGNOSIS — I10 Essential (primary) hypertension: Secondary | ICD-10-CM | POA: Insufficient documentation

## 2015-02-19 LAB — URINE MICROSCOPIC-ADD ON: Bacteria, UA: NONE SEEN

## 2015-02-19 LAB — URINALYSIS, ROUTINE W REFLEX MICROSCOPIC
BILIRUBIN URINE: NEGATIVE
Glucose, UA: NEGATIVE mg/dL
KETONES UR: NEGATIVE mg/dL
Leukocytes, UA: NEGATIVE
NITRITE: NEGATIVE
PROTEIN: NEGATIVE mg/dL
Specific Gravity, Urine: 1.02 (ref 1.005–1.030)
pH: 6 (ref 5.0–8.0)

## 2015-02-19 MED ORDER — KETOROLAC TROMETHAMINE 30 MG/ML IJ SOLN
30.0000 mg | Freq: Once | INTRAMUSCULAR | Status: AC
  Administered 2015-02-19: 30 mg via INTRAVENOUS
  Filled 2015-02-19: qty 1

## 2015-02-19 MED ORDER — TAMSULOSIN HCL 0.4 MG PO CAPS: 0.4000 mg | ORAL_CAPSULE | Freq: Every day | ORAL | Status: DC

## 2015-02-19 MED ORDER — HYDROMORPHONE HCL 1 MG/ML IJ SOLN
1.0000 mg | INTRAMUSCULAR | Status: DC | PRN
  Administered 2015-02-19: 1 mg via INTRAVENOUS
  Filled 2015-02-19 (×2): qty 1

## 2015-02-19 MED ORDER — OXYCODONE-ACETAMINOPHEN 5-325 MG PO TABS: 2.0000 | ORAL_TABLET | ORAL | Status: DC | PRN

## 2015-02-19 MED ORDER — SODIUM CHLORIDE 0.9 % IV BOLUS (SEPSIS)
1000.0000 mL | Freq: Once | INTRAVENOUS | Status: AC
  Administered 2015-02-19: 1000 mL via INTRAVENOUS

## 2015-02-19 MED ORDER — TAMSULOSIN HCL 0.4 MG PO CAPS
0.4000 mg | ORAL_CAPSULE | Freq: Once | ORAL | Status: AC
  Administered 2015-02-19: 0.4 mg via ORAL
  Filled 2015-02-19: qty 1

## 2015-02-19 MED ORDER — ONDANSETRON 4 MG PO TBDP: 4.0000 mg | ORAL_TABLET | Freq: Three times a day (TID) | ORAL | Status: DC | PRN

## 2015-02-19 MED ORDER — ONDANSETRON HCL 4 MG/2ML IJ SOLN
4.0000 mg | Freq: Once | INTRAMUSCULAR | Status: AC
  Administered 2015-02-19: 4 mg via INTRAVENOUS
  Filled 2015-02-19: qty 2

## 2015-02-19 NOTE — Discharge Instructions (Signed)
Kidney Stones °Kidney stones (urolithiasis) are deposits that form inside your kidneys. The intense pain is caused by the stone moving through the urinary tract. When the stone moves, the ureter goes into spasm around the stone. The stone is usually passed in the urine.  °CAUSES  °· A disorder that makes certain neck glands produce too much parathyroid hormone (primary hyperparathyroidism). °· A buildup of uric acid crystals, similar to gout in your joints. °· Narrowing (stricture) of the ureter. °· A kidney obstruction present at birth (congenital obstruction). °· Previous surgery on the kidney or ureters. °· Numerous kidney infections. °SYMPTOMS  °· Feeling sick to your stomach (nauseous). °· Throwing up (vomiting). °· Blood in the urine (hematuria). °· Pain that usually spreads (radiates) to the groin. °· Frequency or urgency of urination. °DIAGNOSIS  °· Taking a history and physical exam. °· Blood or urine tests. °· CT scan. °· Occasionally, an examination of the inside of the urinary bladder (cystoscopy) is performed. °TREATMENT  °· Observation. °· Increasing your fluid intake. °· Extracorporeal shock wave lithotripsy--This is a noninvasive procedure that uses shock waves to break up kidney stones. °· Surgery may be needed if you have severe pain or persistent obstruction. There are various surgical procedures. Most of the procedures are performed with the use of small instruments. Only small incisions are needed to accommodate these instruments, so recovery time is minimized. °The size, location, and chemical composition are all important variables that will determine the proper choice of action for you. Talk to your health care provider to better understand your situation so that you will minimize the risk of injury to yourself and your kidney.  °HOME CARE INSTRUCTIONS  °· Drink enough water and fluids to keep your urine clear or pale yellow. This will help you to pass the stone or stone fragments. °· Strain  all urine through the provided strainer. Keep all particulate matter and stones for your health care provider to see. The stone causing the pain may be as small as a grain of salt. It is very important to use the strainer each and every time you pass your urine. The collection of your stone will allow your health care provider to analyze it and verify that a stone has actually passed. The stone analysis will often identify what you can do to reduce the incidence of recurrences. °· Only take over-the-counter or prescription medicines for pain, discomfort, or fever as directed by your health care provider. °· Keep all follow-up visits as told by your health care provider. This is important. °· Get follow-up X-rays if required. The absence of pain does not always mean that the stone has passed. It may have only stopped moving. If the urine remains completely obstructed, it can cause loss of kidney function or even complete destruction of the kidney. It is your responsibility to make sure X-rays and follow-ups are completed. Ultrasounds of the kidney can show blockages and the status of the kidney. Ultrasounds are not associated with any radiation and can be performed easily in a matter of minutes. °· Make changes to your daily diet as told by your health care provider. You may be told to: °¨ Limit the amount of salt that you eat. °¨ Eat 5 or more servings of fruits and vegetables each day. °¨ Limit the amount of meat, poultry, fish, and eggs that you eat. °· Collect a 24-hour urine sample as told by your health care provider. You may need to collect another urine sample every 6-12   months. °SEEK MEDICAL CARE IF: °· You experience pain that is progressive and unresponsive to any pain medicine you have been prescribed. °SEEK IMMEDIATE MEDICAL CARE IF:  °· Pain cannot be controlled with the prescribed medicine. °· You have a fever or shaking chills. °· The severity or intensity of pain increases over 18 hours and is not  relieved by pain medicine. °· You develop a new onset of abdominal pain. °· You feel faint or pass out. °· You are unable to urinate. °  °This information is not intended to replace advice given to you by your health care provider. Make sure you discuss any questions you have with your health care provider. °  °Document Released: 03/09/2005 Document Revised: 11/28/2014 Document Reviewed: 08/10/2012 °[employer] Interactive Patient Education ©2016 [employer] ° °

## 2015-02-19 NOTE — ED Provider Notes (Signed)
CSN: [id number]     Arrival date & time 02/19/15  1315 History   First MD Initiated Contact with Patient 02/19/15 1331     Chief Complaint  Patient presents with  . Flank Pain     HPI  Patient presents for evaluation of flank pain. History of multiple ureteral stones. Has had previous lithotripsy many years ago. Symptoms started 4 days ago in his flank and progressed to his right lower abdomen. Nausea and vomiting today presents here. No pain with movement. No fevers. Typical colicky pain.  Past Medical History  Diagnosis Date  . Hypertension   . Hyperlipidemia   . Kidney stones   . Chronic headaches   . Anxiety    Past Surgical History  Procedure Laterality Date  . Lithotripsy    . Joint replacement     Family History  Problem Relation Age of Onset  . Hypertension Father    Social History  Substance Use Topics  . Smoking status: Former Smoker -- 0.50 packs/day    Quit date: 06/22/2014  . Smokeless tobacco: Current User    Types: Snuff  . Alcohol Use: 3.0 oz/week    5 Cans of beer per week     Comment: occassionally    Review of Systems  Constitutional: Negative for fever, chills, diaphoresis, appetite change and fatigue.  HENT: Negative for mouth sores, sore throat and trouble swallowing.   Eyes: Negative for visual disturbance.  Respiratory: Negative for cough, chest tightness, shortness of breath and wheezing.   Cardiovascular: Negative for chest pain.  Gastrointestinal: Positive for nausea, vomiting and abdominal pain. Negative for diarrhea and abdominal distention.  Endocrine: Negative for polydipsia, polyphagia and polyuria.  Genitourinary: Positive for flank pain. Negative for dysuria, frequency and hematuria.  Musculoskeletal: Negative for gait problem.  Skin: Negative for color change, pallor and rash.  Neurological: Negative for dizziness, syncope, light-headedness and headaches.  Hematological: Does not bruise/bleed easily.  Psychiatric/Behavioral:  Negative for behavioral problems and confusion.      Allergies  Review of patient's allergies indicates no known allergies.  Home Medications   Prior to Admission medications   Medication Sig Start Date End Date Taking? Authorizing Provider  ALPRAZolam Prudy Feeler(XANAX) 0.5 MG tablet Take 0.5 mg by mouth at bedtime as needed for anxiety.    Yes Historical Provider, MD  metoprolol (LOPRESSOR) 50 MG tablet Take 50 mg by mouth 2 (two) times daily.   Yes Historical Provider, MD  ibuprofen (ADVIL,MOTRIN) 800 MG tablet Take 1 tablet (800 mg total) by mouth every 8 (eight) hours as needed for mild pain. Patient not taking: Reported on 02/19/2015 11/15/14   Kristen N Ward, DO  ondansetron (ZOFRAN ODT) 4 MG disintegrating tablet Take 1 tablet (4 mg total) by mouth every 8 (eight) hours as needed for nausea. 02/19/15   Rolland PorterMark Aalyssa Elderkin, MD  oxyCODONE-acetaminophen (PERCOCET/ROXICET) 5-325 MG tablet Take 2 tablets by mouth every 4 (four) hours as needed. 02/19/15   Rolland PorterMark Maygan Koeller, MD  promethazine (PHENERGAN) 25 MG tablet Take 1 tablet (25 mg total) by mouth every 6 (six) hours as needed for nausea or vomiting. Patient not taking: Reported on 12/23/2014 11/15/14   Layla MawKristen N Ward, DO  tamsulosin (FLOMAX) 0.4 MG CAPS capsule Take 1 capsule (0.4 mg total) by mouth daily. 02/19/15   Rolland PorterMark Heron Pitcock, MD   BP 139/79 mmHg  Pulse 86  Temp(Src) 98.1 F (36.7 C) (Oral)  Resp 18  Ht 5\' 8"  (1.727 m)  Wt 250 lb (113.399 kg)  BMI  38.02 kg/m2  SpO2 100% Physical Exam  Constitutional: He is oriented to person, place, and time. He appears well-developed and well-nourished. No distress.  HENT:  Head: Normocephalic.  Eyes: Conjunctivae are normal. Pupils are equal, round, and reactive to light. No scleral icterus.  Neck: Normal range of motion. Neck supple. No thyromegaly present.  Cardiovascular: Normal rate and regular rhythm.  Exam reveals no gallop and no friction rub.   No murmur heard. Pulmonary/Chest: Effort normal and breath  sounds normal. No respiratory distress. He has no wheezes. He has no rales.  Abdominal: Soft. Bowel sounds are normal. He exhibits no distension. There is no tenderness. There is no rebound.    Musculoskeletal: Normal range of motion.  Neurological: He is alert and oriented to person, place, and time.  Skin: Skin is warm and dry. No rash noted.  Psychiatric: He has a normal mood and affect. His behavior is normal.    ED Course  Procedures (including critical care time) Labs Review Labs Reviewed  URINALYSIS, ROUTINE W REFLEX MICROSCOPIC (NOT AT [facility]) - Abnormal; Notable for the following:    Hgb urine dipstick LARGE (*)    All other components within normal limits  URINE MICROSCOPIC-ADD ON - Abnormal; Notable for the following:    Squamous Epithelial / LPF 0-5 (*)    All other components within normal limits    Imaging Review No results found. I have personally reviewed and evaluated these images and lab results as part of my medical decision-making.   EKG Interpretation None      MDM   Final diagnoses:  Ureteral colic    Patient with hematuria. Imaging not indicated at this time. His most recent imaging does show ureteral stones. Given [facility] for follow-up and encouraged to do so if not passing the stone within the next several days. Prescription for Percocet, Flomax, Zofran.    Rolland Porter, MD 02/19/15 [phone]

## 2015-02-19 NOTE — ED Notes (Signed)
Pt c/o of RT sided flank pain, dysuria, and nausea x 2 days. Pt hx of kidney stones.

## 2015-02-28 ENCOUNTER — Emergency Department (HOSPITAL_COMMUNITY)
Admission: EM | Admit: 2015-02-28 | Discharge: 2015-02-28 | Disposition: A | Discharge status: Home / Self Care | Payer: Self-pay | Attending: Emergency Medicine | Admitting: Emergency Medicine

## 2015-02-28 ENCOUNTER — Emergency Department (HOSPITAL_COMMUNITY): Payer: Self-pay

## 2015-02-28 ENCOUNTER — Encounter (HOSPITAL_COMMUNITY): Payer: Self-pay

## 2015-02-28 DIAGNOSIS — R1110 Vomiting, unspecified: Secondary | ICD-10-CM | POA: Insufficient documentation

## 2015-02-28 DIAGNOSIS — Z9889 Other specified postprocedural states: Secondary | ICD-10-CM | POA: Insufficient documentation

## 2015-02-28 DIAGNOSIS — N200 Calculus of kidney: Secondary | ICD-10-CM | POA: Insufficient documentation

## 2015-02-28 DIAGNOSIS — G8929 Other chronic pain: Secondary | ICD-10-CM | POA: Insufficient documentation

## 2015-02-28 DIAGNOSIS — F419 Anxiety disorder, unspecified: Secondary | ICD-10-CM | POA: Insufficient documentation

## 2015-02-28 DIAGNOSIS — Z79899 Other long term (current) drug therapy: Secondary | ICD-10-CM | POA: Insufficient documentation

## 2015-02-28 DIAGNOSIS — I10 Essential (primary) hypertension: Secondary | ICD-10-CM | POA: Insufficient documentation

## 2015-02-28 DIAGNOSIS — Z87891 Personal history of nicotine dependence: Secondary | ICD-10-CM | POA: Insufficient documentation

## 2015-02-28 DIAGNOSIS — Z8639 Personal history of other endocrine, nutritional and metabolic disease: Secondary | ICD-10-CM | POA: Insufficient documentation

## 2015-02-28 LAB — URINALYSIS, ROUTINE W REFLEX MICROSCOPIC
BILIRUBIN URINE: NEGATIVE
GLUCOSE, UA: NEGATIVE mg/dL
KETONES UR: NEGATIVE mg/dL
Leukocytes, UA: NEGATIVE
NITRITE: NEGATIVE
PROTEIN: NEGATIVE mg/dL
SPECIFIC GRAVITY, URINE: 1.025 (ref 1.005–1.030)
pH: 5.5 (ref 5.0–8.0)

## 2015-02-28 LAB — URINE MICROSCOPIC-ADD ON

## 2015-02-28 MED ORDER — KETOROLAC TROMETHAMINE 10 MG PO TABS: 10.0000 mg | ORAL_TABLET | Freq: Three times a day (TID) | ORAL | Status: DC | PRN

## 2015-02-28 MED ORDER — TAMSULOSIN HCL 0.4 MG PO CAPS: 0.4000 mg | ORAL_CAPSULE | Freq: Every day | ORAL | Status: DC

## 2015-02-28 MED ORDER — KETOROLAC TROMETHAMINE 30 MG/ML IJ SOLN
30.0000 mg | Freq: Once | INTRAMUSCULAR | Status: AC
  Administered 2015-02-28: 30 mg via INTRAMUSCULAR
  Filled 2015-02-28: qty 1

## 2015-02-28 NOTE — Discharge Instructions (Signed)
As discussed, it is important that you follow up as soon as possible with your physician for continued management of your condition. ° °If you develop any new, or concerning changes in your condition, please return to the emergency department immediately. ° °

## 2015-02-28 NOTE — ED Notes (Signed)
PT states right flank/groin pain with some blood in urine with hx of kidney stones and stated was seen in the ED for same 10 days ago.

## 2015-02-28 NOTE — ED Notes (Signed)
Patient reports of right lower abdomen pain that radiates into right groin since last night. Pain worse today per patient. States he has seen blood in urine. Reports of nausea and vomiting. States he was seen approx. 10 days ago and told he has 6mm stone.

## 2015-02-28 NOTE — ED Notes (Signed)
MD at bedside. 

## 2015-02-28 NOTE — ED Provider Notes (Signed)
CSN: [id number]     Arrival date & time 02/28/15  1626 History   First MD Initiated Contact with Patient 02/28/15 1636     Chief Complaint  Patient presents with  . Groin Pain     (Consider location/radiation/quality/duration/timing/severity/associated sxs/prior Treatment) HPI Patient presents with concern of ongoing right flank, groin pain. This episode began yesterday, though he has had pain for about 10 days. Patient was seen here about one week ago, diagnosed with kidney stone. He has not yet followed up with urology. This exacerbation began, and has not improved in spite of taking OTC medication. He continues to produce urine, denies fever, acknowledges nausea with vomiting. Patient has a long history of kidney stones, no recent interventions, nor urology visits.   Past Medical History  Diagnosis Date  . Hypertension   . Hyperlipidemia   . Kidney stones   . Chronic headaches   . Anxiety    Past Surgical History  Procedure Laterality Date  . Lithotripsy    . Joint replacement     Family History  Problem Relation Age of Onset  . Hypertension Father    Social History  Substance Use Topics  . Smoking status: Former Smoker -- 0.50 packs/day    Quit date: 06/22/2014  . Smokeless tobacco: Current User    Types: Snuff  . Alcohol Use: 3.0 oz/week    5 Cans of beer per week     Comment: occassionally    Review of Systems  Constitutional:       Per HPI, otherwise negative  HENT:       Per HPI, otherwise negative  Respiratory:       Per HPI, otherwise negative  Cardiovascular:       Per HPI, otherwise negative  Gastrointestinal: Positive for vomiting.  Endocrine:       Negative aside from HPI  Genitourinary:       Neg aside from HPI   Musculoskeletal:       Per HPI, otherwise negative  Skin: Negative.   Neurological: Negative for syncope.      Allergies  Review of patient's allergies indicates no known allergies.  Home Medications   Prior to  Admission medications   Medication Sig Start Date End Date Taking? Authorizing Provider  ALPRAZolam Prudy Feeler(XANAX) 0.5 MG tablet Take 0.5 mg by mouth at bedtime as needed for anxiety.    Yes Historical Provider, MD  metoprolol (LOPRESSOR) 50 MG tablet Take 50 mg by mouth 2 (two) times daily.   Yes Historical Provider, MD  ibuprofen (ADVIL,MOTRIN) 800 MG tablet Take 1 tablet (800 mg total) by mouth every 8 (eight) hours as needed for mild pain. Patient not taking: Reported on 02/19/2015 11/15/14   Kristen N Ward, DO  ondansetron (ZOFRAN ODT) 4 MG disintegrating tablet Take 1 tablet (4 mg total) by mouth every 8 (eight) hours as needed for nausea. 02/19/15   Rolland PorterMark James, MD  oxyCODONE-acetaminophen (PERCOCET/ROXICET) 5-325 MG tablet Take 2 tablets by mouth every 4 (four) hours as needed. Patient not taking: Reported on 02/28/2015 02/19/15   Rolland PorterMark James, MD  promethazine (PHENERGAN) 25 MG tablet Take 1 tablet (25 mg total) by mouth every 6 (six) hours as needed for nausea or vomiting. Patient not taking: Reported on 12/23/2014 11/15/14   Layla MawKristen N Ward, DO  tamsulosin (FLOMAX) 0.4 MG CAPS capsule Take 1 capsule (0.4 mg total) by mouth daily. Patient not taking: Reported on 02/28/2015 02/19/15   Rolland PorterMark James, MD   BP 143/83 mmHg  Pulse 78  Temp(Src) 97.4 F (36.3 C) (Oral)  Resp 18  Ht  (1.727 m)  Wt 270 lb (122.471 kg)  BMI 41.06 kg/m2  SpO2 98% Physical Exam  Constitutional: He is oriented to person, place, and time. He appears well-developed. No distress.  HENT:  Head: Normocephalic and atraumatic.  Eyes: Conjunctivae and EOM are normal.  Cardiovascular: Normal rate and regular rhythm.   Pulmonary/Chest: Effort normal. No stridor. No respiratory distress.  Abdominal: He exhibits no distension.  Mild tenderness to palpation about the right lateral abdomen  Musculoskeletal: He exhibits no edema.  Neurological: He is alert and oriented to person, place, and time.  Skin: Skin is warm and dry.   Psychiatric: He has a normal mood and affect.  Nursing note and vitals reviewed.   ED Course  Procedures (including critical care time) Labs Review Labs Reviewed  URINALYSIS, ROUTINE W REFLEX MICROSCOPIC (NOT AT [facility])    Imaging Review US Renal  02/28/2015  CLINICAL DATA:  Right flank and groin pain since last night. History of bilateral renal calculi and lithotripsy. EXAM: RENAL / URINARY TRACT ULTRASOUND COMPLETE COMPARISON:  Renal ultrasound 12/23/2014.  CT 10/11/2014. FINDINGS: Right Kidney: Length: 11.6 cm. Echogenicity within normal limits. No mass or hydronephrosis visualized. No perinephric fluid collection identified. There are several shadowing echogenic foci consistent with nephrolithiasis. Left Kidney: Length: 11.4 cm. Echogenicity within normal limits. No mass or hydronephrosis visualized. No perinephric fluid collection identified. There are several shadowing echogenic foci consistent with nephrolithiasis. Bladder: Appears normal for degree of bladder distention. The bladder is not well distended however. Ureteral jets not visualized. IMPRESSION: 1. Bilateral nephrolithiasis. 2. No evidence of hydronephrosis or perinephric fluid collection. Ureteral jets not visualized in the bladder. Electronically Signed   By: Carey Bullocks M.D.   On: 02/28/2015 17:29   I have personally reviewed and evaluated these images and lab results as part of my medical decision-making.  Chart review notable for multiple visits for kidney stone, last CT scan several months ago. On repeat exam the patient appears better. I discussed the findings with him.   MDM  Patient presents with new right abdominal, groin pain. Here, no evidence for infection or obstruction, patient's pain improved. Patient discharged to follow-up with urology.   Gerhard Munch, MD 02/28/15 [phone]

## 2015-03-07 ENCOUNTER — Encounter: Payer: Self-pay | Admitting: Emergency Medicine

## 2015-03-07 ENCOUNTER — Emergency Department: Payer: Self-pay

## 2015-03-07 ENCOUNTER — Emergency Department
Admission: EM | Admit: 2015-03-07 | Discharge: 2015-03-07 | Disposition: A | Discharge status: Home / Self Care | Payer: Self-pay | Attending: Emergency Medicine | Admitting: Emergency Medicine

## 2015-03-07 DIAGNOSIS — I10 Essential (primary) hypertension: Secondary | ICD-10-CM | POA: Insufficient documentation

## 2015-03-07 DIAGNOSIS — R109 Unspecified abdominal pain: Secondary | ICD-10-CM | POA: Insufficient documentation

## 2015-03-07 DIAGNOSIS — R3911 Hesitancy of micturition: Secondary | ICD-10-CM | POA: Insufficient documentation

## 2015-03-07 DIAGNOSIS — Z87891 Personal history of nicotine dependence: Secondary | ICD-10-CM | POA: Insufficient documentation

## 2015-03-07 LAB — CBC WITH DIFFERENTIAL/PLATELET
BASOS ABS: 0.1 10*3/uL (ref 0–0.1)
Basophils Relative: 1 %
EOS ABS: 0.7 10*3/uL (ref 0–0.7)
Eosinophils Relative: 7 %
HEMATOCRIT: 47.3 % (ref 40.0–52.0)
HEMOGLOBIN: 16.2 g/dL (ref 13.0–18.0)
Lymphocytes Relative: 30 %
Lymphs Abs: 3 10*3/uL (ref 1.0–3.6)
MCH: 33 pg (ref 26.0–34.0)
MCHC: 34.2 g/dL (ref 32.0–36.0)
MCV: 96.4 fL (ref 80.0–100.0)
MONOS PCT: 10 %
Monocytes Absolute: 1 10*3/uL (ref 0.2–1.0)
NEUTROS ABS: 5.1 10*3/uL (ref 1.4–6.5)
NEUTROS PCT: 52 %
Platelets: 271 10*3/uL (ref 150–440)
RBC: 4.91 MIL/uL (ref 4.40–5.90)
RDW: 13.4 % (ref 11.5–14.5)
WBC: 9.9 10*3/uL (ref 3.8–10.6)

## 2015-03-07 LAB — BASIC METABOLIC PANEL
Anion gap: 10 (ref 5–15)
BUN: 9 mg/dL (ref 6–20)
CO2: 24 mmol/L (ref 22–32)
CREATININE: 0.97 mg/dL (ref 0.61–1.24)
Calcium: 9.3 mg/dL (ref 8.9–10.3)
Chloride: 106 mmol/L (ref 101–111)
GFR calc Af Amer: >60 mL/min (ref >60)
Glucose, Bld: 106 mg/dL — ABNORMAL HIGH (ref 65–99)
POTASSIUM: 3.9 mmol/L (ref 3.5–5.1)
SODIUM: 140 mmol/L (ref 135–145)

## 2015-03-07 LAB — URINALYSIS COMPLETE WITH MICROSCOPIC (ARMC ONLY)
BACTERIA UA: NONE SEEN
Bilirubin Urine: NEGATIVE
Glucose, UA: NEGATIVE mg/dL
Ketones, ur: NEGATIVE mg/dL
Leukocytes, UA: NEGATIVE
NITRITE: NEGATIVE
PROTEIN: NEGATIVE mg/dL
SPECIFIC GRAVITY, URINE: 1.016 (ref 1.005–1.030)
pH: 5 (ref 5.0–8.0)

## 2015-03-07 MED ORDER — KETOROLAC TROMETHAMINE 30 MG/ML IJ SOLN
30.0000 mg | Freq: Once | INTRAMUSCULAR | Status: AC
  Administered 2015-03-07: 30 mg via INTRAVENOUS
  Filled 2015-03-07: qty 1

## 2015-03-07 MED ORDER — KETOROLAC TROMETHAMINE 10 MG PO TABS: 10.0000 mg | ORAL_TABLET | Freq: Four times a day (QID) | ORAL | Status: DC | PRN

## 2015-03-07 MED ORDER — TAMSULOSIN HCL 0.4 MG PO CAPS: 0.4000 mg | ORAL_CAPSULE | Freq: Every day | ORAL | Status: DC

## 2015-03-07 NOTE — ED Notes (Signed)
Pt presents with right side flank pain started last night, thinks may be a kidney stone. Pt with hx of same in the past. Difficulty passing urine at times.

## 2015-03-07 NOTE — Discharge Instructions (Signed)
Flank Pain °Flank pain refers to pain that is located on the side of the body between the upper abdomen and the back. The pain may occur over a short period of time (acute) or may be long-term or reoccurring (chronic). It may be mild or severe. Flank pain can be caused by many things. °CAUSES  °Some of the more common causes of flank pain include: °· Muscle strains.   °· Muscle spasms.   °· A disease of your spine (vertebral disk disease).   °· A lung infection (pneumonia).   °· Fluid around your lungs (pulmonary edema).   °· A kidney infection.   °· Kidney stones.   °· A very painful skin rash caused by the chickenpox virus (shingles).   °· Gallbladder disease.   °HOME CARE INSTRUCTIONS  °Home care will depend on the cause of your pain. In general, °· Rest as directed by your caregiver. °· Drink enough fluids to keep your urine clear or pale yellow. °· Only take over-the-counter or prescription medicines as directed by your caregiver. Some medicines may help relieve the pain. °· Tell your caregiver about any changes in your pain. °· Follow up with your caregiver as directed. °SEEK IMMEDIATE MEDICAL CARE IF:  °· Your pain is not controlled with medicine.   °· You have new or worsening symptoms. °· Your pain increases.   °· You have abdominal pain.   °· You have shortness of breath.   °· You have persistent nausea or vomiting.   °· You have swelling in your abdomen.   °· You feel faint or pass out.   °· You have blood in your urine. °· You have a fever or persistent symptoms for more than 2-3 days. °· You have a fever and your symptoms suddenly get worse. °MAKE SURE YOU:  °· Understand these instructions. °· Will watch your condition. °· Will get help right away if you are not doing well or get worse. °  °This information is not intended to replace advice given to you by your health care provider. Make sure you discuss any questions you have with your health care provider. °  °Document Released: 04/30/2005 Document  Revised: 12/02/2011 Document Reviewed: 10/22/2011 °Elsevier Interactive Patient Education ©2016 [employer] ° °

## 2015-03-07 NOTE — ED Provider Notes (Signed)
[facility] Emergency Department Provider Note     Time seen: ----------------------------------------- 10:14 AM on 03/07/2015 -----------------------------------------    I have reviewed the triage vital signs and the nursing notes.   HISTORY  Chief Complaint Flank Pain    HPI Jorge Nichols is a 23 y.o. male who presents to ER with right sided flank pain that started last night. Patient concerned it may be a kidney stone as he has had a history of same in the past. He's had difficulty making urine at times.Patient reports history of kidney stones and the pain always starts in his right side. Currently pain is in his right groin only. He denies fevers chills or other complaints.   Past Medical History  Diagnosis Date  . Hypertension   . Hyperlipidemia   . Kidney stones   . Chronic headaches   . Anxiety     Patient Active Problem List   Diagnosis Date Noted  . Mixed dyslipidemia 09/09/2012  . Metabolic syndrome 09/09/2012  . Morbid obesity (HCC) 09/09/2012  . Sleep disorder breathing 09/09/2012  . Family history of premature CAD 09/09/2012    Past Surgical History  Procedure Laterality Date  . Lithotripsy    . Joint replacement      Allergies Review of patient's allergies indicates no known allergies.  Social History Social History  Substance Use Topics  . Smoking status: Former Smoker -- 0.50 packs/day    Quit date: 06/22/2014  . Smokeless tobacco: Current User    Types: Snuff  . Alcohol Use: 3.0 oz/week    5 Cans of beer per week     Comment: occassionally    Review of Systems Constitutional: Negative for fever. Eyes: Negative for visual changes. ENT: Negative for sore throat. Cardiovascular: Negative for chest pain. Respiratory: Negative for shortness of breath. Gastrointestinal: Positive for flank pain Genitourinary: Positive for urinary hesitancy Musculoskeletal: Negative for back pain. Skin: Negative for  rash. Neurological: Negative for headaches, focal weakness or numbness.  10-point ROS otherwise negative.  ____________________________________________   PHYSICAL EXAM:  VITAL SIGNS: ED Triage Vitals  Enc Vitals Group     BP 03/07/15 1009 140/70 mmHg     Pulse Rate 03/07/15 1009 86     Resp 03/07/15 1009 20     Temp 03/07/15 1009 98.1 F (36.7 C)     Temp Source 03/07/15 1009 Oral     SpO2 03/07/15 1009 97 %     Weight 03/07/15 1009 260 lb (117.935 kg)     Height 03/07/15 1009  (1.727 m)     Head Cir --      Peak Flow --      Pain Score 03/07/15 1010 8     Pain Loc --      Pain Edu? --      Excl. in GC? --     Constitutional: Alert and oriented. Well appearing and in no distress. Eyes: Conjunctivae are normal. PERRL. Normal extraocular movements. ENT   Head: Normocephalic and atraumatic.   Nose: No congestion/rhinnorhea.   Mouth/Throat: Mucous membranes are moist.   Neck: No stridor. Cardiovascular: Normal rate, regular rhythm. Normal and symmetric distal pulses are present in all extremities. No murmurs, rubs, or gallops. Respiratory: Normal respiratory effort without tachypnea nor retractions. Breath sounds are clear and equal bilaterally. No wheezes/rales/rhonchi. Gastrointestinal: Soft and nontender. No distention. No abdominal bruits.  Musculoskeletal: Nontender with normal range of motion in all extremities. No joint effusions.  No lower extremity tenderness  nor edema. Neurologic:  Normal speech and language. No gross focal neurologic deficits are appreciated. Speech is normal. No gait instability. Skin:  Skin is warm, dry and intact. No rash noted. Psychiatric: Mood and affect are normal. Speech and behavior are normal. Patient exhibits appropriate insight and judgment. ____________________________________________  ED COURSE:  Pertinent labs & imaging results that were available during my care of the patient were reviewed by me and considered in  my medical decision making (see chart for details). Patient is in no acute distress, will check basic labs and reevaluate. ____________________________________________    LABS (pertinent positives/negatives)  Labs Reviewed  URINALYSIS COMPLETEWITH MICROSCOPIC ([facility] ONLY) - Abnormal; Notable for the following:    Color, Urine YELLOW (*)    APPearance CLEAR (*)    Hgb urine dipstick 3+ (*)    Squamous Epithelial / LPF 0-5 (*)    All other components within normal limits  BASIC METABOLIC PANEL - Abnormal; Notable for the following:    Glucose, Bld 106 (*)    All other components within normal limits  CBC WITH DIFFERENTIAL/PLATELET    RADIOLOGY Images were viewed by me  KUB IMPRESSION: Suspected bilateral nephrolithiasis, right greater than left. Further evaluation with noncontrast abdominal CT could be performed as clinically indicated. ____________________________________________  FINAL ASSESSMENT AND PLAN  Flank pain  Plan: Patient with labs and imaging as dictated above. Patient has chronic hematuria of uncertain etiology. He does have chronic intrarenal kidney stones but rarely has had extra renal stones seen on ultrasound or CT. We discharged with Motrin, Flomax and urology follow-up.   Emily FilbertWilliams, Jonathan E, MD   Emily FilbertJonathan E Williams, MD 03/07/15 [phone]

## 2015-03-17 ENCOUNTER — Emergency Department (HOSPITAL_COMMUNITY)
Admission: EM | Admit: 2015-03-17 | Discharge: 2015-03-17 | Disposition: A | Discharge status: Home / Self Care | Payer: Self-pay | Attending: Emergency Medicine | Admitting: Emergency Medicine

## 2015-03-17 ENCOUNTER — Encounter (HOSPITAL_COMMUNITY): Payer: Self-pay

## 2015-03-17 DIAGNOSIS — N23 Unspecified renal colic: Secondary | ICD-10-CM | POA: Insufficient documentation

## 2015-03-17 DIAGNOSIS — Z8639 Personal history of other endocrine, nutritional and metabolic disease: Secondary | ICD-10-CM | POA: Insufficient documentation

## 2015-03-17 DIAGNOSIS — I10 Essential (primary) hypertension: Secondary | ICD-10-CM | POA: Insufficient documentation

## 2015-03-17 DIAGNOSIS — G8929 Other chronic pain: Secondary | ICD-10-CM | POA: Insufficient documentation

## 2015-03-17 DIAGNOSIS — R319 Hematuria, unspecified: Secondary | ICD-10-CM | POA: Insufficient documentation

## 2015-03-17 DIAGNOSIS — F419 Anxiety disorder, unspecified: Secondary | ICD-10-CM | POA: Insufficient documentation

## 2015-03-17 DIAGNOSIS — Z87891 Personal history of nicotine dependence: Secondary | ICD-10-CM | POA: Insufficient documentation

## 2015-03-17 DIAGNOSIS — Z79899 Other long term (current) drug therapy: Secondary | ICD-10-CM | POA: Insufficient documentation

## 2015-03-17 DIAGNOSIS — Z87442 Personal history of urinary calculi: Secondary | ICD-10-CM | POA: Insufficient documentation

## 2015-03-17 LAB — URINALYSIS, ROUTINE W REFLEX MICROSCOPIC
BILIRUBIN URINE: NEGATIVE
Glucose, UA: NEGATIVE mg/dL
Ketones, ur: NEGATIVE mg/dL
Leukocytes, UA: NEGATIVE
NITRITE: NEGATIVE
PH: 7 (ref 5.0–8.0)
Protein, ur: NEGATIVE mg/dL
SPECIFIC GRAVITY, URINE: 1.015 (ref 1.005–1.030)

## 2015-03-17 LAB — URINE MICROSCOPIC-ADD ON
Bacteria, UA: NONE SEEN
Squamous Epithelial / LPF: NONE SEEN
WBC UA: NONE SEEN WBC/hpf (ref 0–5)

## 2015-03-17 MED ORDER — HYDROCODONE-ACETAMINOPHEN 5-325 MG PO TABS: 2.0000 | ORAL_TABLET | ORAL | Status: DC | PRN

## 2015-03-17 MED ORDER — TAMSULOSIN HCL 0.4 MG PO CAPS: 0.4000 mg | ORAL_CAPSULE | Freq: Two times a day (BID) | ORAL | Status: DC

## 2015-03-17 MED ORDER — KETOROLAC TROMETHAMINE 60 MG/2ML IM SOLN
60.0000 mg | Freq: Once | INTRAMUSCULAR | Status: AC
  Administered 2015-03-17: 60 mg via INTRAMUSCULAR
  Filled 2015-03-17: qty 2

## 2015-03-17 MED ORDER — HYDROCODONE-ACETAMINOPHEN 5-325 MG PO TABS
2.0000 | ORAL_TABLET | Freq: Once | ORAL | Status: AC
  Administered 2015-03-17: 2 via ORAL
  Filled 2015-03-17: qty 2

## 2015-03-17 MED ORDER — IBUPROFEN 800 MG PO TABS: 800.0000 mg | ORAL_TABLET | Freq: Three times a day (TID) | ORAL | Status: DC

## 2015-03-17 NOTE — Discharge Instructions (Signed)

## 2015-03-17 NOTE — ED Provider Notes (Signed)
CSN: [id number]     Arrival date & time 03/17/15  1808 History   First MD Initiated Contact with Patient 03/17/15 1841     Chief Complaint  Patient presents with  . Abdominal Pain     (Consider location/radiation/quality/duration/timing/severity/associated sxs/prior Treatment) HPI Comments: The patient is a 24 year old male, he has history of multiple visits to the emergency department for kidney stones, he comes once or twice a month, he states that he has been having right-sided flank pain for over a month, he feels as though the pain is gradually progression from the flank to the groin. He states that this pain will go away for a couple of days and then come back as a gradually progressive down. He states that he is seeing some blood in his urine but not having fevers chills nausea or vomiting. The pain has been particularly intense today, it is consistent with prior episodes of kidney stones, he has had multiple evaluations with ultrasound and CT scan showing bilateral intrarenal stones as well as the occasional showering kidney stone. He was placed on Flomax and Motrin approximately 10 days ago.  Patient is a 24 y.o. male presenting with abdominal pain. The history is provided by the patient.  Abdominal Pain   Past Medical History  Diagnosis Date  . Hypertension   . Hyperlipidemia   . Kidney stones   . Chronic headaches   . Anxiety   . Kidney stones    Past Surgical History  Procedure Laterality Date  . Lithotripsy    . Joint replacement    . Total hip arthroplasty     Family History  Problem Relation Age of Onset  . Hypertension Father    Social History  Substance Use Topics  . Smoking status: Former Smoker -- 0.50 packs/day    Quit date: 06/22/2014  . Smokeless tobacco: Current User    Types: Snuff  . Alcohol Use: 3.0 oz/week    5 Cans of beer per week     Comment: occassionally    Review of Systems  Gastrointestinal: Positive for abdominal pain.  All other  systems reviewed and are negative.     Allergies  Review of patient's allergies indicates no known allergies.  Home Medications   Prior to Admission medications   Medication Sig Start Date End Date Taking? Authorizing Provider  ALPRAZolam Prudy Feeler) 0.5 MG tablet Take 0.5 mg by mouth at bedtime as needed for anxiety.    Yes Historical Provider, MD  metoprolol (LOPRESSOR) 50 MG tablet Take 50 mg by mouth 2 (two) times daily.   Yes Historical Provider, MD  HYDROcodone-acetaminophen (NORCO/VICODIN) 5-325 MG tablet Take 2 tablets by mouth every 4 (four) hours as needed for moderate pain. 03/17/15   Eber Hong, MD  ibuprofen (ADVIL,MOTRIN) 800 MG tablet Take 1 tablet (800 mg total) by mouth 3 (three) times daily. 03/17/15   Eber Hong, MD  tamsulosin (FLOMAX) 0.4 MG CAPS capsule Take 1 capsule (0.4 mg total) by mouth 2 (two) times daily. 03/17/15   Eber Hong, MD   BP 141/103 mmHg  Pulse 69  Temp(Src) 97.4 F (36.3 C) (Oral)  Resp 24  Ht  (1.651 m)  Wt 260 lb (117.935 kg)  BMI 43.27 kg/m2  SpO2 99% Physical Exam  Constitutional: He appears well-developed and well-nourished.  Appears colicky uncomfortable  HENT:  Head: Normocephalic and atraumatic.  Mouth/Throat: Oropharynx is clear and moist. No oropharyngeal exudate.  Eyes: Conjunctivae and EOM are normal. Pupils are equal, round, and  reactive to light. Right eye exhibits no discharge. Left eye exhibits no discharge. No scleral icterus.  Neck: Normal range of motion. Neck supple. No JVD present. No thyromegaly present.  Cardiovascular: Normal rate, regular rhythm, normal heart sounds and intact distal pulses.  Exam reveals no gallop and no friction rub.   No murmur heard. Pulmonary/Chest: Effort normal and breath sounds normal. No respiratory distress. He has no wheezes. He has no rales.  Abdominal: Soft. Bowel sounds are normal. He exhibits no distension and no mass. There is no tenderness.  Obese nontender abdomen, no CVA  tenderness  Musculoskeletal: Normal range of motion. He exhibits no edema or tenderness.  Lymphadenopathy:    He has no cervical adenopathy.  Neurological: He is alert. Coordination normal.  Skin: Skin is warm and dry. No rash noted. No erythema.  Psychiatric: He has a normal mood and affect. His behavior is normal.  Nursing note and vitals reviewed.   ED Course  Procedures (including critical care time) Labs Review Labs Reviewed  URINALYSIS, ROUTINE W REFLEX MICROSCOPIC (NOT AT [facility]) - Abnormal; Notable for the following:    Hgb urine dipstick LARGE (*)    All other components within normal limits  URINE MICROSCOPIC-ADD ON    Imaging Review No results found. I have personally reviewed and evaluated these images and lab results as part of my medical decision-making.    MDM   Final diagnoses:  Ureteral colic  Hematuria    The patient has vital signs which show mild hypertension otherwise unremarkable, he does not have an increased respiratory rate on my exam. He is not tachycardic, he is not febrile, at this point the patient could have progressive ureterolithiasis, would also consider drug-seeking behavior though less likely knowing that he has frequent episodes of kidney stones that he states he passes frequently. At this time I would suggest that the patient needs close follow-up, he does state that he has a urology follow-up in the next 3 weeks, he has insurance that kicks in within one week. He will be given Toradol, dose of hydrocodone, check urinalysis to rule out infection, otherwise appears stable for discharge. There is no vomiting  No indication for imaging at this time  UA with hematuria, no infection     Meds given in ED:  Medications  ketorolac (TORADOL) injection 60 mg (60 mg Intramuscular Given 03/17/15 1928)  HYDROcodone-acetaminophen (NORCO/VICODIN) 5-325 MG per tablet 2 tablet (2 tablets Oral Given 03/17/15 1928)    New Prescriptions    HYDROCODONE-ACETAMINOPHEN (NORCO/VICODIN) 5-325 MG TABLET    Take 2 tablets by mouth every 4 (four) hours as needed for moderate pain.   IBUPROFEN (ADVIL,MOTRIN) 800 MG TABLET    Take 1 tablet (800 mg total) by mouth 3 (three) times daily.   TAMSULOSIN (FLOMAX) 0.4 MG CAPS CAPSULE    Take 1 capsule (0.4 mg total) by mouth 2 (two) times daily.        Eber HongBrian Jonice Cerra, MD 03/17/15 2011

## 2015-03-17 NOTE — ED Notes (Signed)
Pt reports that he has hx of kidney stones. Pain started right side and have moved into groin

## 2015-04-07 ENCOUNTER — Encounter (HOSPITAL_COMMUNITY): Payer: Self-pay | Admitting: *Deleted

## 2015-04-07 ENCOUNTER — Emergency Department (HOSPITAL_COMMUNITY)
Admission: EM | Admit: 2015-04-07 | Discharge: 2015-04-07 | Disposition: A | Discharge status: Home / Self Care | Payer: BLUE CROSS/BLUE SHIELD | Attending: Emergency Medicine | Admitting: Emergency Medicine

## 2015-04-07 DIAGNOSIS — F419 Anxiety disorder, unspecified: Secondary | ICD-10-CM | POA: Insufficient documentation

## 2015-04-07 DIAGNOSIS — R109 Unspecified abdominal pain: Secondary | ICD-10-CM | POA: Diagnosis not present

## 2015-04-07 DIAGNOSIS — I10 Essential (primary) hypertension: Secondary | ICD-10-CM | POA: Insufficient documentation

## 2015-04-07 DIAGNOSIS — Z8639 Personal history of other endocrine, nutritional and metabolic disease: Secondary | ICD-10-CM | POA: Diagnosis not present

## 2015-04-07 DIAGNOSIS — R112 Nausea with vomiting, unspecified: Secondary | ICD-10-CM | POA: Diagnosis not present

## 2015-04-07 DIAGNOSIS — Z87891 Personal history of nicotine dependence: Secondary | ICD-10-CM | POA: Diagnosis not present

## 2015-04-07 DIAGNOSIS — Z87442 Personal history of urinary calculi: Secondary | ICD-10-CM | POA: Diagnosis not present

## 2015-04-07 DIAGNOSIS — Z79899 Other long term (current) drug therapy: Secondary | ICD-10-CM | POA: Diagnosis not present

## 2015-04-07 DIAGNOSIS — G8929 Other chronic pain: Secondary | ICD-10-CM | POA: Insufficient documentation

## 2015-04-07 DIAGNOSIS — Z791 Long term (current) use of non-steroidal anti-inflammatories (NSAID): Secondary | ICD-10-CM | POA: Diagnosis not present

## 2015-04-07 LAB — URINALYSIS, ROUTINE W REFLEX MICROSCOPIC
Bilirubin Urine: NEGATIVE
GLUCOSE, UA: NEGATIVE mg/dL
KETONES UR: NEGATIVE mg/dL
LEUKOCYTES UA: NEGATIVE
NITRITE: NEGATIVE
PROTEIN: NEGATIVE mg/dL
Specific Gravity, Urine: <1.005 — ABNORMAL LOW (ref 1.005–1.030)
pH: 6.5 (ref 5.0–8.0)

## 2015-04-07 LAB — URINE MICROSCOPIC-ADD ON

## 2015-04-07 MED ORDER — CYCLOBENZAPRINE HCL 10 MG PO TABS
10.0000 mg | ORAL_TABLET | Freq: Three times a day (TID) | ORAL | Status: DC | PRN
Start: 2015-04-07 — End: 2015-07-14

## 2015-04-07 MED ORDER — METOCLOPRAMIDE HCL 5 MG/ML IJ SOLN
10.0000 mg | Freq: Once | INTRAMUSCULAR | Status: AC
  Administered 2015-04-07: 10 mg via INTRAVENOUS
  Filled 2015-04-07: qty 2

## 2015-04-07 MED ORDER — TRAMADOL HCL 50 MG PO TABS: 100.0000 mg | ORAL_TABLET | Freq: Four times a day (QID) | ORAL | Status: DC | PRN

## 2015-04-07 MED ORDER — KETOROLAC TROMETHAMINE 30 MG/ML IJ SOLN
30.0000 mg | Freq: Once | INTRAMUSCULAR | Status: AC
  Administered 2015-04-07: 30 mg via INTRAVENOUS
  Filled 2015-04-07: qty 1

## 2015-04-07 MED ORDER — TAMSULOSIN HCL 0.4 MG PO CAPS: ORAL_CAPSULE | ORAL | Status: DC

## 2015-04-07 MED ORDER — DIPHENHYDRAMINE HCL 50 MG/ML IJ SOLN
25.0000 mg | Freq: Once | INTRAMUSCULAR | Status: AC
  Administered 2015-04-07: 25 mg via INTRAVENOUS
  Filled 2015-04-07: qty 1

## 2015-04-07 MED ORDER — NAPROXEN 500 MG PO TABS: 500.0000 mg | ORAL_TABLET | Freq: Two times a day (BID) | ORAL | Status: DC

## 2015-04-07 MED ORDER — FENTANYL CITRATE (PF) 100 MCG/2ML IJ SOLN
INTRAMUSCULAR | Status: AC
  Administered 2015-04-07: 50 ug
  Filled 2015-04-07: qty 2

## 2015-04-07 NOTE — Discharge Instructions (Signed)
Drink plenty of fluids. Take the medications as precribed. Keep your appointment on Tuesday, Jan 17th with [facility].  Return to the ED if you get a fever.

## 2015-04-07 NOTE — ED Notes (Signed)
Pt reporting pain in left flank x2 days.  Reporting associated N/V.  Pt does have history of kidney stones and states pain in similar.

## 2015-04-07 NOTE — ED Provider Notes (Signed)
CSN: [id number]     Arrival date & time 04/07/15  0116 History   First MD Initiated Contact with Patient 04/07/15 0155    Chief Complaint  Patient presents with  . Flank Pain     (Consider location/radiation/quality/duration/timing/severity/associated sxs/prior Treatment) HPI patient reports he has had kidney stones for years. He states he started having pain a few days ago in his left flank area. He states every once in a while it "sort of" goes into his groin area. He states he woke up about 11 PM with worsening pain. He reports nausea with vomiting 3. He denies diarrhea. He states nothing he does makes the pain worse, he states sometimes a warm bath and walking makes the pain feel better. He states he has his first appointment at [facility] on the 17th. He has had multiple ED visits for the same.   PCP Dr [facility], first appt on Jan 17  Past Medical History  Diagnosis Date  . Hypertension   . Hyperlipidemia   . Kidney stones   . Chronic headaches   . Anxiety   . Kidney stones    Past Surgical History  Procedure Laterality Date  . Lithotripsy    . Joint replacement    . Total hip arthroplasty     Family History  Problem Relation Age of Onset  . Hypertension Father    Social History  Substance Use Topics  . Smoking status: Former Smoker -- 0.50 packs/day    Quit date: 06/22/2014  . Smokeless tobacco: Current User    Types: Snuff  . Alcohol Use: 3.0 oz/week    5 Cans of beer per week     Comment: occassionally    Review of Systems  All other systems reviewed and are negative.     Allergies  Review of patient's allergies indicates no known allergies.  Home Medications   Prior to Admission medications   Medication Sig Start Date End Date Taking? Authorizing Provider  ALPRAZolam Prudy Feeler) 0.5 MG tablet Take 0.5 mg by mouth at bedtime as needed for anxiety.     Historical Provider, MD  cyclobenzaprine (FLEXERIL) 10 MG tablet Take 1 tablet  (10 mg total) by mouth 3 (three) times daily as needed for muscle spasms. 04/07/15   Devoria Albe, MD  HYDROcodone-acetaminophen (NORCO/VICODIN) 5-325 MG tablet Take 2 tablets by mouth every 4 (four) hours as needed for moderate pain. 03/17/15   Eber Hong, MD  ibuprofen (ADVIL,MOTRIN) 800 MG tablet Take 1 tablet (800 mg total) by mouth 3 (three) times daily. 03/17/15   Eber Hong, MD  metoprolol (LOPRESSOR) 50 MG tablet Take 50 mg by mouth 2 (two) times daily.    Historical Provider, MD  naproxen (NAPROSYN) 500 MG tablet Take 1 tablet (500 mg total) by mouth 2 (two) times daily. 04/07/15   Devoria Albe, MD  tamsulosin (FLOMAX) 0.4 MG CAPS capsule Take 1 po QD until you pass the stone. 04/07/15   Devoria Albe, MD  traMADol (ULTRAM) 50 MG tablet Take 2 tablets (100 mg total) by mouth every 6 (six) hours as needed. 04/07/15   Devoria Albe, MD   BP 143/96 mmHg  Pulse 95  Temp(Src) 98.5 F (36.9 C)  Resp 22  Ht 5\' 8"  (1.727 m)  Wt 260 lb (117.935 kg)  BMI 39.54 kg/m2  SpO2 98%  Vital signs normal   Physical Exam  Constitutional: He is oriented to person, place, and time. He appears well-developed and well-nourished.  Non-toxic appearance.  He does not appear ill. No distress.  HENT:  Head: Normocephalic and atraumatic.  Right Ear: External ear normal.  Left Ear: External ear normal.  Nose: Nose normal. No mucosal edema or rhinorrhea.  Mouth/Throat: Oropharynx is clear and moist and mucous membranes are normal. No dental abscesses or uvula swelling.  Eyes: Conjunctivae and EOM are normal. Pupils are equal, round, and reactive to light.  Neck: Normal range of motion and full passive range of motion without pain. Neck supple.  Cardiovascular: Normal rate, regular rhythm and normal heart sounds.  Exam reveals no gallop and no friction rub.   No murmur heard. Pulmonary/Chest: Effort normal and breath sounds normal. No respiratory distress. He has no wheezes. He has no rhonchi. He has no rales. He exhibits  no tenderness and no crepitus.  Abdominal: Soft. Normal appearance and bowel sounds are normal. He exhibits no distension. There is no tenderness. There is no rebound and no guarding.  Patient indicates he has left flank pain, it is not tender to palpation.  Musculoskeletal: Normal range of motion. He exhibits no edema or tenderness.  Moves all extremities well.   Neurological: He is alert and oriented to person, place, and time. He has normal strength. No cranial nerve deficit.  Skin: Skin is warm, dry and intact. No rash noted. No erythema. No pallor.  Psychiatric: He has a normal mood and affect. His speech is normal and behavior is normal. His mood appears not anxious.  Nursing note and vitals reviewed.   ED Course  Procedures (including critical care time)  Medications  ketorolac (TORADOL) 30 MG/ML injection 30 mg (30 mg Intravenous Given 04/07/15 0216)  metoCLOPramide (REGLAN) injection 10 mg (10 mg Intravenous Given 04/07/15 0216)  diphenhydrAMINE (BENADRYL) injection 25 mg (25 mg Intravenous Given 04/07/15 0215)  fentaNYL (SUBLIMAZE) 100 MCG/2ML injection (50 mcg  Given 04/07/15 0325)   Patient was given IV Toradol with IV Reglan and Benadryl for his pain and nausea.  This is the patient's 10th ED visit [facility] our ED [facility] for flank pain. He has had multiple CT scans showing he has bilateral nephrolithiasis, sometimes he has ureteral stones but not always. He was seen in the ED 3 times in December about once a week and the end of November,   Patient was rechecked at 3:20 AM. He states his pain was improved but however start to return. He was given fentanyl IV.   04:20 AM pt states he is feeling better, ready to be discharged.   Labs Review Results for orders placed or performed during the hospital encounter of 04/07/15  Urinalysis, Routine w reflex microscopic (not at [facility])  Result Value Ref Range   Color, Urine YELLOW YELLOW   APPearance CLEAR CLEAR    Specific Gravity, Urine <1.005 (L) 1.005 - 1.030   pH 6.5 5.0 - 8.0   Glucose, UA NEGATIVE NEGATIVE mg/dL   Hgb urine dipstick LARGE (A) NEGATIVE   Bilirubin Urine NEGATIVE NEGATIVE   Ketones, ur NEGATIVE NEGATIVE mg/dL   Protein, ur NEGATIVE NEGATIVE mg/dL   Nitrite NEGATIVE NEGATIVE   Leukocytes, UA NEGATIVE NEGATIVE  Urine microscopic-add on  Result Value Ref Range   Squamous Epithelial / LPF 0-5 (A) NONE SEEN   WBC, UA 0-5 0 - 5 WBC/hpf   RBC / HPF TOO NUMEROUS TO COUNT 0 - 5 RBC/hpf   Bacteria, UA FEW (A) NONE SEEN   Laboratory interpretation all normal except persistent hematuria     MDM  patient presents with left flank pain. He has history of multiple ED visits for flank pain. He has had multiple CT scans showing bilateral nephrolithiasis. Sometimes he is having a ureteral stones but the other times he isn't. His last ultrasound in December showed no hydronephrosis. He also had x-ray December that showed no obvious ureteral stone. Patient was treated symptomatically and discharged from the ED.    Final diagnoses:  Left flank pain    New Prescriptions   CYCLOBENZAPRINE (FLEXERIL) 10 MG TABLET    Take 1 tablet (10 mg total) by mouth 3 (three) times daily as needed for muscle spasms.   NAPROXEN (NAPROSYN) 500 MG TABLET    Take 1 tablet (500 mg total) by mouth 2 (two) times daily.   TAMSULOSIN (FLOMAX) 0.4 MG CAPS CAPSULE    Take 1 po QD until you pass the stone.   TRAMADOL (ULTRAM) 50 MG TABLET    Take 2 tablets (100 mg total) by mouth every 6 (six) hours as needed.    Plan discharge  Devoria Albe, MD, Concha Pyo, MD 04/07/15 [phone]

## 2015-05-10 ENCOUNTER — Ambulatory Visit (INDEPENDENT_AMBULATORY_CARE_PROVIDER_SITE_OTHER): Payer: BLUE CROSS/BLUE SHIELD | Admitting: Urology

## 2015-05-10 DIAGNOSIS — N200 Calculus of kidney: Secondary | ICD-10-CM | POA: Diagnosis not present

## 2015-05-10 DIAGNOSIS — R000 Tachycardia, unspecified: Secondary | ICD-10-CM

## 2015-05-13 ENCOUNTER — Encounter: Payer: Self-pay | Admitting: *Deleted

## 2015-05-13 DIAGNOSIS — N200 Calculus of kidney: Secondary | ICD-10-CM | POA: Insufficient documentation

## 2015-05-13 DIAGNOSIS — I10 Essential (primary) hypertension: Secondary | ICD-10-CM | POA: Insufficient documentation

## 2015-05-17 ENCOUNTER — Encounter: Payer: Self-pay | Admitting: Obstetrics and Gynecology

## 2015-05-17 ENCOUNTER — Ambulatory Visit: Payer: Self-pay | Admitting: Obstetrics and Gynecology

## 2015-05-18 ENCOUNTER — Encounter (HOSPITAL_COMMUNITY): Payer: Self-pay | Admitting: Emergency Medicine

## 2015-05-18 ENCOUNTER — Emergency Department (HOSPITAL_COMMUNITY)
Admission: EM | Admit: 2015-05-18 | Discharge: 2015-05-19 | Disposition: A | Discharge status: Home / Self Care | Payer: BLUE CROSS/BLUE SHIELD | Attending: Emergency Medicine | Admitting: Emergency Medicine

## 2015-05-18 DIAGNOSIS — Z79899 Other long term (current) drug therapy: Secondary | ICD-10-CM | POA: Insufficient documentation

## 2015-05-18 DIAGNOSIS — I10 Essential (primary) hypertension: Secondary | ICD-10-CM | POA: Diagnosis not present

## 2015-05-18 DIAGNOSIS — R51 Headache: Secondary | ICD-10-CM | POA: Insufficient documentation

## 2015-05-18 DIAGNOSIS — R112 Nausea with vomiting, unspecified: Secondary | ICD-10-CM | POA: Diagnosis not present

## 2015-05-18 DIAGNOSIS — R519 Headache, unspecified: Secondary | ICD-10-CM

## 2015-05-18 DIAGNOSIS — E785 Hyperlipidemia, unspecified: Secondary | ICD-10-CM | POA: Diagnosis not present

## 2015-05-18 DIAGNOSIS — F17200 Nicotine dependence, unspecified, uncomplicated: Secondary | ICD-10-CM | POA: Diagnosis not present

## 2015-05-18 DIAGNOSIS — Z87442 Personal history of urinary calculi: Secondary | ICD-10-CM | POA: Insufficient documentation

## 2015-05-18 MED ORDER — SODIUM CHLORIDE 0.9 % IV SOLN
1000.0000 mL | Freq: Once | INTRAVENOUS | Status: AC
  Administered 2015-05-18: 1000 mL via INTRAVENOUS

## 2015-05-18 MED ORDER — DEXAMETHASONE SODIUM PHOSPHATE 10 MG/ML IJ SOLN
10.0000 mg | Freq: Once | INTRAMUSCULAR | Status: AC
  Administered 2015-05-18: 10 mg via INTRAVENOUS
  Filled 2015-05-18: qty 1

## 2015-05-18 MED ORDER — METOCLOPRAMIDE HCL 5 MG/ML IJ SOLN
10.0000 mg | Freq: Once | INTRAMUSCULAR | Status: AC
  Administered 2015-05-18: 10 mg via INTRAVENOUS
  Filled 2015-05-18: qty 2

## 2015-05-18 MED ORDER — KETOROLAC TROMETHAMINE 30 MG/ML IJ SOLN
30.0000 mg | Freq: Once | INTRAMUSCULAR | Status: AC
  Administered 2015-05-18: 30 mg via INTRAVENOUS
  Filled 2015-05-18: qty 1

## 2015-05-18 MED ORDER — SODIUM CHLORIDE 0.9 % IV SOLN
1000.0000 mL | INTRAVENOUS | Status: DC
  Administered 2015-05-18: 1000 mL via INTRAVENOUS

## 2015-05-18 MED ORDER — DIPHENHYDRAMINE HCL 50 MG/ML IJ SOLN
25.0000 mg | Freq: Once | INTRAMUSCULAR | Status: AC
Start: 2015-05-18 — End: 2015-05-18
  Administered 2015-05-18: 25 mg via INTRAVENOUS
  Filled 2015-05-18: qty 1

## 2015-05-18 NOTE — ED Provider Notes (Signed)
CSN: [id number]     Arrival date & time 05/18/15  2153 History   First MD Initiated Contact with Patient 05/18/15 2254     Chief Complaint  Patient presents with  . Headache     (Consider location/radiation/quality/duration/timing/severity/associated sxs/prior Treatment) Patient is a 25 y.o. male presenting with headaches. The history is provided by the patient.  Headache He comes in complaining of a left retro-orbital headache which started about 7 PM. Pain is dull and throbbing he rates it at 9/10. There is associated photophobia but no phonophobia. He denies fever or chills. He denies visual change. There's been nausea but no vomiting. He has not taken anything for his headache. He has history of similar headaches about 10 years ago which were diagnosed as cluster headaches. Also, of note, his urologist has recently diagnosed him with hyperparathyroidism and hyperuricemia, but treatment is pending specialist evaluation.  Past Medical History  Diagnosis Date  . Hypertension   . Hyperlipidemia   . Chronic headaches   . Anxiety   . Kidney stones   . Kidney stones    Past Surgical History  Procedure Laterality Date  . Lithotripsy    . Joint replacement    . Total hip arthroplasty     Family History  Problem Relation Age of Onset  . Hypertension Father    Social History  Substance Use Topics  . Smoking status: Current Every Day Smoker -- 0.50 packs/day  . Smokeless tobacco: Current User    Types: Snuff  . Alcohol Use: 3.0 oz/week    5 Cans of beer per week     Comment: occassionally    Review of Systems  Neurological: Positive for headaches.  All other systems reviewed and are negative.     Allergies  Review of patient's allergies indicates no known allergies.  Home Medications   Prior to Admission medications   Medication Sig Start Date End Date Taking? Authorizing Provider  ALPRAZolam Prudy Feeler) 0.5 MG tablet Take 0.5 mg by mouth at bedtime as needed for anxiety.     Yes Historical Provider, MD  HYDROcodone-acetaminophen (NORCO/VICODIN) 5-325 MG tablet Take 2 tablets by mouth every 4 (four) hours as needed for moderate pain. 03/17/15  Yes Eber Hong, MD  metoprolol (LOPRESSOR) 50 MG tablet Take 50 mg by mouth 2 (two) times daily.   Yes Historical Provider, MD  tamsulosin (FLOMAX) 0.4 MG CAPS capsule Take 1 po QD until you pass the stone. Patient taking differently: Take 0.4 mg by mouth daily after breakfast. Take 1 po QD until you pass the stone. 04/07/15  Yes Devoria Albe, MD  traMADol (ULTRAM) 50 MG tablet Take 2 tablets (100 mg total) by mouth every 6 (six) hours as needed. Patient taking differently: Take 50 mg by mouth every 6 (six) hours as needed.  04/07/15  Yes Devoria Albe, MD  cyclobenzaprine (FLEXERIL) 10 MG tablet Take 1 tablet (10 mg total) by mouth 3 (three) times daily as needed for muscle spasms. 04/07/15   Devoria Albe, MD  ibuprofen (ADVIL,MOTRIN) 800 MG tablet Take 1 tablet (800 mg total) by mouth 3 (three) times daily. 03/17/15   Eber Hong, MD  naproxen (NAPROSYN) 500 MG tablet Take 1 tablet (500 mg total) by mouth 2 (two) times daily. 04/07/15   Devoria Albe, MD   BP 148/82 mmHg  Pulse 114  Temp(Src) 97.7 F (36.5 C) (Oral)  Resp 16  Ht  (1.727 m)  Wt 260 lb (117.935 kg)  BMI 39.54 kg/m2  SpO2  99% Physical Exam  Nursing note and vitals reviewed.  25 year old male, resting comfortably and in no acute distress. Vital signs are significant for hypertension and tachycardia. Oxygen saturation is 99%, which is normal. Head is normocephalic and atraumatic. PERRLA, EOMI. Oropharynx is clear. Fundi show no hemorrhage, exudate, or papilledema. Neck is nontender and supple without adenopathy or JVD. Back is nontender and there is no CVA tenderness. Lungs are clear without rales, wheezes, or rhonchi. Chest is nontender. Heart is tachycardic without murmur. Abdomen is soft, flat, nontender without masses or hepatosplenomegaly and peristalsis is  normoactive. Extremities have no cyanosis or edema, full range of motion is present. Skin is warm and dry without rash. Neurologic: Mental status is normal, cranial nerves are intact, there are no motor or sensory deficits.  ED Course  Procedures (including critical care time)  Imaging Review Ct Head Wo Contrast  05/19/2015  CLINICAL DATA:  Acute onset of left-sided facial and eye pain. Photosensitivity and nausea. Initial encounter. EXAM: CT HEAD WITHOUT CONTRAST TECHNIQUE: Contiguous axial images were obtained from the base of the skull through the vertex without intravenous contrast. COMPARISON:  CT of the head performed 12/11/2013 FINDINGS: There is no evidence of acute infarction, mass lesion, or intra- or extra-axial hemorrhage on CT. The posterior fossa, including the cerebellum, brainstem and fourth ventricle, is within normal limits. The third and lateral ventricles, and basal ganglia are unremarkable in appearance. The cerebral hemispheres are symmetric in appearance, with normal gray-white differentiation. No mass effect or midline shift is seen. There is no evidence of fracture; visualized osseous structures are unremarkable in appearance. The visualized portions of the orbits are within normal limits. The paranasal sinuses and mastoid air cells are well-aerated. No significant soft tissue abnormalities are seen. IMPRESSION: Unremarkable noncontrast CT of the head. Electronically Signed   By: Roanna Raider M.D.   On: 05/19/2015 02:23   I have personally reviewed and evaluated these images and lab results as part of my medical decision-making.   MDM   Final diagnoses:  Headache, unspecified headache type  Non-intractable vomiting with nausea, unspecified vomiting type    Headache which is suggestive of migraine headache. Old records are reviewed and I see no relevant past visits. Also, I can find no laboratory tests to confirm hyperparathyroidism or hyperuricemia. Calcium on  03/07/2015 was 9.3.  He was given IV fluids, metoclopramide, diphenhydramine, ketorolac, and dexamethasone with only partial relief of headache and ongoing nausea. He was given ondansetron which did not help the nausea. Because of persistence of symptoms, he was sent for CT of head which is come back unremarkable. He continued to have complaints of headache and was given morphine. He was then given promethazine which seems to have given him good relief of all symptoms. He is discharged with prescription for oxycodone have acetaminophen and promethazine.  Dione Booze, MD 05/19/15 [phone]

## 2015-05-18 NOTE — ED Notes (Addendum)
Pt reports he has been diagnosed with "hyperparathyroidism and hyperuricemia".

## 2015-05-18 NOTE — ED Notes (Signed)
Pt states he started having pain on left side of face and down into eye, sensitivity to light, and nausea around 1900 tonight

## 2015-05-19 ENCOUNTER — Emergency Department (HOSPITAL_COMMUNITY): Payer: BLUE CROSS/BLUE SHIELD

## 2015-05-19 MED ORDER — OXYCODONE-ACETAMINOPHEN 5-325 MG PO TABS: 1.0000 | ORAL_TABLET | ORAL | Status: DC | PRN

## 2015-05-19 MED ORDER — MORPHINE SULFATE (PF) 4 MG/ML IV SOLN
4.0000 mg | Freq: Once | INTRAVENOUS | Status: AC
  Administered 2015-05-19: 4 mg via INTRAVENOUS
  Filled 2015-05-19: qty 1

## 2015-05-19 MED ORDER — ONDANSETRON HCL 4 MG/2ML IJ SOLN
4.0000 mg | Freq: Once | INTRAMUSCULAR | Status: AC
  Administered 2015-05-19: 4 mg via INTRAVENOUS
  Filled 2015-05-19: qty 2

## 2015-05-19 MED ORDER — PROMETHAZINE HCL 25 MG/ML IJ SOLN
25.0000 mg | Freq: Once | INTRAMUSCULAR | Status: AC
  Administered 2015-05-19: 25 mg via INTRAVENOUS
  Filled 2015-05-19: qty 1

## 2015-05-19 MED ORDER — PROMETHAZINE HCL 25 MG PO TABS
25.0000 mg | ORAL_TABLET | Freq: Four times a day (QID) | ORAL | Status: DC | PRN
Start: 2015-05-19 — End: 2015-07-14

## 2015-05-19 NOTE — ED Notes (Signed)
Computer rebooting, unable to scan pt or medications

## 2015-05-19 NOTE — Discharge Instructions (Signed)
General Headache Without Cause °A headache is pain or discomfort felt around the head or neck area. The specific cause of a headache may not be found. There are many causes and types of headaches. A few common ones are: °· Tension headaches. °· Migraine headaches. °· Cluster headaches. °· Chronic daily headaches. °HOME CARE INSTRUCTIONS  °Watch your condition for any changes. Take these steps to help with your condition: °Managing Pain °· Take over-the-counter and prescription medicines only as told by your health care provider. °· Lie down in a dark, quiet room when you have a headache. °· If directed, apply ice to the head and neck area: °· Put ice in a plastic bag. °· Place a towel between your skin and the bag. °· Leave the ice on for 20 minutes, 2-3 times per day. °· Use a heating pad or hot shower to apply heat to the head and neck area as told by your health care provider. °· Keep lights dim if bright lights bother you or make your headaches worse. °Eating and Drinking °· Eat meals on a regular schedule. °· Limit alcohol use. °· Decrease the amount of caffeine you drink, or stop drinking caffeine. °General Instructions °· Keep all follow-up visits as told by your health care provider. This is important. °· Keep a headache journal to help find out what may trigger your headaches. For example, write down: °· What you eat and drink. °· How much sleep you get. °· Any change to your diet or medicines. °· Try massage or other relaxation techniques. °· Limit stress. °· Sit up straight, and do not tense your muscles. °· Do not use tobacco products, including cigarettes, chewing tobacco, or e-cigarettes. If you need help quitting, ask your health care provider. °· Exercise regularly as told by your health care provider. °· Sleep on a regular schedule. Get 7-9 hours of sleep, or the amount recommended by your health care provider. °SEEK MEDICAL CARE IF:  °· Your symptoms are not helped by medicine. °· You have a  headache that is different from the usual headache. °· You have nausea or you vomit. °· You have a fever. °SEEK IMMEDIATE MEDICAL CARE IF:  °· Your headache becomes severe. °· You have repeated vomiting. °· You have a stiff neck. °· You have a loss of vision. °· You have problems with speech. °· You have pain in the eye or ear. °· You have muscular weakness or loss of muscle control. °· You lose your balance or have trouble walking. °· You feel faint or pass out. °· You have confusion. °  °This information is not intended to replace advice given to you by your health care provider. Make sure you discuss any questions you have with your health care provider. °  °Document Released: 03/09/2005 Document Revised: 11/28/2014 Document Reviewed: 07/02/2014 °Elsevier Interactive Patient Education ©2016 [employer] °Nausea and Vomiting °Nausea is a sick feeling that often comes before throwing up (vomiting). Vomiting is a reflex where stomach contents come out of your mouth. Vomiting can cause severe loss of body fluids (dehydration). Children and elderly adults can become dehydrated quickly, especially if they also have diarrhea. Nausea and vomiting are symptoms of a condition or disease. It is important to find the cause of your symptoms. °CAUSES  °· Direct irritation of the stomach lining. This irritation can result from increased acid production (gastroesophageal reflux disease), infection, food poisoning, taking certain medicines (such as nonsteroidal anti-inflammatory drugs), alcohol use, or tobacco use. °· Signals   Signals from the brain.These signals could be caused by a headache, heat exposure, an inner ear disturbance, increased pressure in the brain from injury, infection, a tumor, or a concussion, pain, emotional stimulus, or metabolic problems.  An obstruction in the gastrointestinal tract (bowel obstruction).  Illnesses such as diabetes, hepatitis, gallbladder problems, appendicitis, kidney problems, cancer,  sepsis, atypical symptoms of a heart attack, or eating disorders.  Medical treatments such as chemotherapy and radiation.  Receiving medicine that makes you sleep (general anesthetic) during surgery. DIAGNOSIS Your caregiver may ask for tests to be done if the problems do not improve after a few days. Tests may also be done if symptoms are severe or if the reason for the nausea and vomiting is not clear. Tests may include:  Urine tests.  Blood tests.  Stool tests.  Cultures (to look for evidence of infection).  X-rays or other imaging studies. Test results can help your caregiver make decisions about treatment or the need for additional tests. TREATMENT You need to stay well hydrated. Drink frequently but in small amounts.You may wish to drink water, sports drinks, clear broth, or eat frozen ice pops or gelatin dessert to help stay hydrated.When you eat, eating slowly may help prevent nausea.There are also some antinausea medicines that may help prevent nausea. HOME CARE INSTRUCTIONS   Take all medicine as directed by your caregiver.  If you do not have an appetite, do not force yourself to eat. However, you must continue to drink fluids.  If you have an appetite, eat a normal diet unless your caregiver tells you differently.  Eat a variety of complex carbohydrates (rice, wheat, potatoes, bread), lean meats, yogurt, fruits, and vegetables.  Avoid high-fat foods because they are more difficult to digest.  Drink enough water and fluids to keep your urine clear or pale yellow.  If you are dehydrated, ask your caregiver for specific rehydration instructions. Signs of dehydration may include:  Severe thirst.  Dry lips and mouth.  Dizziness.  Dark urine.  Decreasing urine frequency and amount.  Confusion.  Rapid breathing or pulse. SEEK IMMEDIATE MEDICAL CARE IF:   You have blood or brown flecks (like coffee grounds) in your vomit.  You have black or bloody  stools.  You have a severe headache or stiff neck.  You are confused.  You have severe abdominal pain.  You have chest pain or trouble breathing.  You do not urinate at least once every 8 hours.  You develop cold or clammy skin.  You continue to vomit for longer than 24 to 48 hours.  You have a fever. MAKE SURE YOU:   Understand these instructions.  Will watch your condition.  Will get help right away if you are not doing well or get worse.   This information is not intended to replace advice given to you by your health care provider. Make sure you discuss any questions you have with your health care provider.   Document Released: 03/09/2005 Document Revised: 06/01/2011 Document Reviewed: 08/06/2010 Elsevier Interactive Patient Education 2016 [employer]  Acetaminophen; Oxycodone tablets What is this medicine? ACETAMINOPHEN; OXYCODONE (a set a MEE noe fen; ox i KOE done) is a pain reliever. It is used to treat moderate to severe pain. This medicine may be used for other purposes; ask your health care provider or pharmacist if you have questions. What should I tell my health care provider before I take this medicine? They need to know if you have any of these conditions: -brain  tumor -Crohn's disease, inflammatory bowel disease, or ulcerative colitis -drug abuse or addiction -head injury -heart or circulation problems -if you often drink alcohol -kidney disease or problems going to the bathroom -liver disease -lung disease, asthma, or breathing problems -an unusual or allergic reaction to acetaminophen, oxycodone, other opioid analgesics, other medicines, foods, dyes, or preservatives -pregnant or trying to get pregnant -breast-feeding How should I use this medicine? Take this medicine by mouth with a full glass of water. Follow the directions on the prescription label. You can take it with or without food. If it upsets your stomach, take it with food. Take your  medicine at regular intervals. Do not take it more often than directed. Talk to your pediatrician regarding the use of this medicine in children. Special care may be needed. Patients over 65 years old may have a stronger reaction and need a smaller dose. Overdosage: If you think you have taken too much of this medicine contact a poison control center or emergency room at once. NOTE: This medicine is only for you. Do not share this medicine with others. What if I miss a dose? If you miss a dose, take it as soon as you can. If it is almost time for your next dose, take only that dose. Do not take double or extra doses. What may interact with this medicine? -alcohol -antihistamines -barbiturates like amobarbital, butalbital, butabarbital, methohexital, pentobarbital, phenobarbital, thiopental, and secobarbital -benztropine -drugs for bladder problems like solifenacin, trospium, oxybutynin, tolterodine, hyoscyamine, and methscopolamine -drugs for breathing problems like ipratropium and tiotropium -drugs for certain stomach or intestine problems like propantheline, homatropine methylbromide, glycopyrrolate, atropine, belladonna, and dicyclomine -general anesthetics like etomidate, ketamine, nitrous oxide, propofol, desflurane, enflurane, halothane, isoflurane, and sevoflurane -medicines for depression, anxiety, or psychotic disturbances -medicines for sleep -muscle relaxants -naltrexone -narcotic medicines (opiates) for pain -phenothiazines like perphenazine, thioridazine, chlorpromazine, mesoridazine, fluphenazine, prochlorperazine, promazine, and trifluoperazine -scopolamine -tramadol -trihexyphenidyl This list may not describe all possible interactions. Give your health care provider a list of all the medicines, herbs, non-prescription drugs, or dietary supplements you use. Also tell them if you smoke, drink alcohol, or use illegal drugs. Some items may interact with your medicine. What  should I watch for while using this medicine? Tell your doctor or health care professional if your pain does not go away, if it gets worse, or if you have new or a different type of pain. You may develop tolerance to the medicine. Tolerance means that you will need a higher dose of the medication for pain relief. Tolerance is normal and is expected if you take this medicine for a long time. Do not suddenly stop taking your medicine because you may develop a severe reaction. Your body becomes used to the medicine. This does NOT mean you are addicted. Addiction is a behavior related to getting and using a drug for a non-medical reason. If you have pain, you have a medical reason to take pain medicine. Your doctor will tell you how much medicine to take. If your doctor wants you to stop the medicine, the dose will be slowly lowered over time to avoid any side effects. You may get drowsy or dizzy. Do not drive, use machinery, or do anything that needs mental alertness until you know how this medicine affects you. Do not stand or sit up quickly, especially if you are an older patient. This reduces the risk of dizzy or fainting spells. Alcohol may interfere with the effect of this medicine. Avoid alcoholic drinks. There  are different types of narcotic medicines (opiates) for pain. If you take more than one type at the same time, you may have more side effects. Give your health care provider a list of all medicines you use. Your doctor will tell you how much medicine to take. Do not take more medicine than directed. Call emergency for help if you have problems breathing. The medicine will cause constipation. Try to have a bowel movement at least every 2 to 3 days. If you do not have a bowel movement for 3 days, call your doctor or health care professional. Do not take Tylenol (acetaminophen) or medicines that have acetaminophen with this medicine. Too much acetaminophen can be very dangerous. Many nonprescription  medicines contain acetaminophen. Always read the labels carefully to avoid taking more acetaminophen. What side effects may I notice from receiving this medicine? Side effects that you should report to your doctor or health care professional as soon as possible: -allergic reactions like skin rash, itching or hives, swelling of the face, lips, or tongue -breathing difficulties, wheezing -confusion -light headedness or fainting spells -severe stomach pain -unusually weak or tired -yellowing of the skin or the whites of the eyes Side effects that usually do not require medical attention (report to your doctor or health care professional if they continue or are bothersome): -dizziness -drowsiness -nausea -vomiting This list may not describe all possible side effects. Call your doctor for medical advice about side effects. You may report side effects to FDA at 1-800-FDA-1088. Where should I keep my medicine? Keep out of the reach of children. This medicine can be abused. Keep your medicine in a safe place to protect it from theft. Do not share this medicine with anyone. Selling or giving away this medicine is dangerous and against the law. This medicine may cause accidental overdose and death if it taken by other adults, children, or pets. Mix any unused medicine with a substance like cat litter or coffee grounds. Then throw the medicine away in a sealed container like a sealed bag or a coffee can with a lid. Do not use the medicine after the expiration date. Store at room temperature between 20 and 25 degrees C (68 and 77 degrees F). NOTE: This sheet is a summary. It may not cover all possible information. If you have questions about this medicine, talk to your doctor, pharmacist, or health care provider.    2016, [employer]. (2014-02-07 15:18:46)  Promethazine tablets What is this medicine? PROMETHAZINE (proe METH a zeen) is an antihistamine. It is used to treat allergic reactions  and to treat or prevent nausea and vomiting from illness or motion sickness. It is also used to make you sleep before surgery, and to help treat pain or nausea after surgery. This medicine may be used for other purposes; ask your health care provider or pharmacist if you have questions. What should I tell my health care provider before I take this medicine? They need to know if you have any of these conditions: -glaucoma -high blood pressure or heart disease -kidney disease -liver disease -lung or breathing disease, like asthma -prostate trouble -pain or difficulty passing urine -seizures -an unusual or allergic reaction to promethazine or phenothiazines, other medicines, foods, dyes, or preservatives -pregnant or trying to get pregnant -breast-feeding How should I use this medicine? Take this medicine by mouth with a glass of water. Follow the directions on the prescription label. Take your doses at regular intervals. Do not take your medicine more often than  directed. Talk to your pediatrician regarding the use of this medicine in children. Special care may be needed. This medicine should not be given to infants and children younger than 87 years old. Overdosage: If you think you have taken too much of this medicine contact a poison control center or emergency room at once. NOTE: This medicine is only for you. Do not share this medicine with others. What if I miss a dose? If you miss a dose, take it as soon as you can. If it is almost time for your next dose, take only that dose. Do not take double or extra doses. What may interact with this medicine? Do not take this medicine with any of the following medications: -cisapride -dofetilide -dronedarone -MAOIs like Carbex, Eldepryl, Marplan, Nardil, Parnate -pimozide -quinidine, including dextromethorphan; quinidine -thioridazine -ziprasidone This medicine may also interact with the following medications: -certain medicines for  depression, anxiety, or psychotic disturbances -certain medicines for anxiety or sleep -certain medicines for seizures like carbamazepine, phenobarbital, phenytoin -certain medicines for movement abnormalities as in Parkinson's disease, or for gastrointestinal problems -epinephrine -medicines for allergies or colds -muscle relaxants -narcotic medicines for pain -other medicines that prolong the QT interval (cause an abnormal heart rhythm) -tramadol -trimethobenzamide This list may not describe all possible interactions. Give your health care provider a list of all the medicines, herbs, non-prescription drugs, or dietary supplements you use. Also tell them if you smoke, drink alcohol, or use illegal drugs. Some items may interact with your medicine. What should I watch for while using this medicine? Tell your doctor or health care professional if your symptoms do not start to get better in 1 to 2 days. You may get drowsy or dizzy. Do not drive, use machinery, or do anything that needs mental alertness until you know how this medicine affects you. To reduce the risk of dizzy or fainting spells, do not stand or sit up quickly, especially if you are an older patient. Alcohol may increase dizziness and drowsiness. Avoid alcoholic drinks. Your mouth may get dry. Chewing sugarless gum or sucking hard candy, and drinking plenty of water may help. Contact your doctor if the problem does not go away or is severe. This medicine may cause dry eyes and blurred vision. If you wear contact lenses you may feel some discomfort. Lubricating drops may help. See your eye doctor if the problem does not go away or is severe. This medicine can make you more sensitive to the sun. Keep out of the sun. If you cannot avoid being in the sun, wear protective clothing and use sunscreen. Do not use sun lamps or tanning beds/booths. If you are diabetic, check your blood-sugar levels regularly. What side effects may I notice from  receiving this medicine? Side effects that you should report to your doctor or health care professional as soon as possible: -blurred vision -irregular heartbeat, palpitations or chest pain -muscle or facial twitches -pain or difficulty passing urine -seizures -skin rash -slowed or shallow breathing -unusual bleeding or bruising -yellowing of the eyes or skin Side effects that usually do not require medical attention (report to your doctor or health care professional if they continue or are bothersome): -headache -nightmares, agitation, nervousness, excitability, not able to sleep (these are more likely in children) -stuffy nose This list may not describe all possible side effects. Call your doctor for medical advice about side effects. You may report side effects to FDA at 1-800-FDA-1088. Where should I keep my medicine? Keep out of the  reach of children. Store at room temperature, between 20 and 25 degrees C (68 and 77 degrees F). Protect from light. Throw away any unused medicine after the expiration date. NOTE: This sheet is a summary. It may not cover all possible information. If you have questions about this medicine, talk to your doctor, pharmacist, or health care provider.    2016, [employer]. (2012-11-08 15:04:46)

## 2015-06-05 ENCOUNTER — Encounter: Payer: Self-pay | Admitting: "Endocrinology

## 2015-06-05 ENCOUNTER — Ambulatory Visit (INDEPENDENT_AMBULATORY_CARE_PROVIDER_SITE_OTHER): Payer: BLUE CROSS/BLUE SHIELD | Admitting: "Endocrinology

## 2015-06-05 VITALS — BP 130/74 | HR 103 | Ht 68.0 in | Wt 276.0 lb

## 2015-06-05 DIAGNOSIS — E212 Other hyperparathyroidism: Secondary | ICD-10-CM | POA: Insufficient documentation

## 2015-06-05 DIAGNOSIS — N200 Calculus of kidney: Secondary | ICD-10-CM

## 2015-06-05 NOTE — Progress Notes (Signed)
Subjective:    Patient ID: Jorge Nichols, male    DOB: July 06, 1990, PCP Jorge Nichols., MD   Past Medical History  Diagnosis Date  . Hypertension   . Hyperlipidemia   . Chronic headaches   . Anxiety   . Kidney stones   . Kidney stones    Past Surgical History  Procedure Laterality Date  . Lithotripsy    . Joint replacement    . Total hip arthroplasty     Social History   Social History  . Marital Status: Married    Spouse Name: N/A  . Number of Children: N/A  . Years of Education: N/A   Social History Main Topics  . Smoking status: Current Every Day Smoker -- 0.50 packs/day  . Smokeless tobacco: Current User    Types: Snuff  . Alcohol Use: 3.0 oz/week    5 Cans of beer per week     Comment: occassionally  . Drug Use: No  . Sexual Activity: Not Asked   Other Topics Concern  . None   Social History Narrative   Outpatient Encounter Prescriptions as of 06/05/2015  Medication Sig  . ALPRAZolam (XANAX) 0.5 MG tablet Take 0.5 mg by mouth at bedtime as needed for anxiety.   . metoprolol (LOPRESSOR) 50 MG tablet Take 50 mg by mouth 2 (two) times daily.  . tamsulosin (FLOMAX) 0.4 MG CAPS capsule Take 1 po QD until you pass the stone. (Patient taking differently: Take 0.4 mg by mouth daily after breakfast. Take 1 po QD until you pass the stone.)  . traMADol (ULTRAM) 50 MG tablet Take 2 tablets (100 mg total) by mouth every 6 (six) hours as needed. (Patient taking differently: Take 50 mg by mouth every 6 (six) hours as needed. )  . cyclobenzaprine (FLEXERIL) 10 MG tablet Take 1 tablet (10 mg total) by mouth 3 (three) times daily as needed for muscle spasms.  Marland Kitchen HYDROcodone-acetaminophen (NORCO/VICODIN) 5-325 MG tablet Take 2 tablets by mouth every 4 (four) hours as needed for moderate pain.  Marland Kitchen ibuprofen (ADVIL,MOTRIN) 800 MG tablet Take 1 tablet (800 mg total) by mouth 3 (three) times daily.  . naproxen (NAPROSYN) 500 MG tablet Take 1 tablet (500 mg total) by mouth 2  (two) times daily.  Marland Kitchen oxyCODONE-acetaminophen (PERCOCET) 5-325 MG tablet Take 1 tablet by mouth every 4 (four) hours as needed for moderate pain.  . promethazine (PHENERGAN) 25 MG tablet Take 1 tablet (25 mg total) by mouth every 6 (six) hours as needed for nausea or vomiting.   No facility-administered encounter medications on file as of 06/05/2015.   ALLERGIES: No Known Allergies VACCINATION STATUS:  There is no immunization history on file for this patient.  HPI  Jorge Nichols is 25 year old male with medical history as above. Being seen in consultation for elevated PTH requested by Jorge Nichols. Medical history significant for nephrolithiasis starting from age 46. Per his report he passes stone every month, underwent lithotripsy at least once, and stone retrieval at least on 2 occasions. He denies history of hypercalcemia nor any intervention for hypercalcemia in the past. He denies family history of similar condition. He denies history of frequent urinary tract infections. He does not recall if he had 24-hour urine study in the past. He has history of avascular necrosis of bilateral hip status post bilateral hip replacement. He is a chronic active smoker. He denies any neck swelling. He does not recall if he had any bone density measurement. Eyes any history of  trivial bone fracture. He reports being overweight/obese most of his adult life. His intake of dairy products is average. He is not taking any calcium supplements. View of his medical records shows absence of hypercalcemia. Highest calcium he ever had such appears to be 10.1 associated with elevated PTH of 84 included in his referral package. Review of Systems Constitutional: + heavy weight , no fatigue, no subjective hyperthermia/hypothermia Eyes: no blurry vision, no xerophthalmia ENT: no sore throat, no nodules palpated in throat, no dysphagia/odynophagia, no hoarseness Cardiovascular: no CP/SOB/palpitations/leg swelling Respiratory:  no cough/SOB Gastrointestinal: no N/V/D/C Musculoskeletal: no muscle/joint aches Skin: no rashes Neurological: no tremors/numbness/tingling/dizziness Psychiatric: no depression/anxiety  Objective:    BP 130/74 mmHg  Pulse 103  Ht 5\' 8"  (1.727 m)  Wt 276 lb (125.193 kg)  BMI 41.98 kg/m2  SpO2 97%  Wt Readings from Last 3 Encounters:  06/05/15 276 lb (125.193 kg)  05/18/15 260 lb (117.935 kg)  04/07/15 260 lb (117.935 kg)    Physical Exam\  Constitutional: obese, in NAD Eyes: PERRLA, EOMI, no exophthalmos ENT: moist mucous membranes, no thyromegaly, no cervical lymphadenopathy Cardiovascular: RRR, No MRG Respiratory: CTA B Gastrointestinal: abdomen soft, obese, NT, ND, BS+ Musculoskeletal: no deformities, strength intact in all 4 Skin: moist, warm, no rashes Neurological: no tremor with outstretched hands, DTR normal in all 4   CMP     Component Value Date/Time   NA 140 03/07/2015 1115   NA 144 12/28/2013 2331   K 3.9 03/07/2015 1115   K 3.6 12/28/2013 2331   CL 106 03/07/2015 1115   CL 106 12/28/2013 2331   CO2 24 03/07/2015 1115   CO2 28 12/28/2013 2331   GLUCOSE 106* 03/07/2015 1115   GLUCOSE 91 12/28/2013 2331   BUN 9 03/07/2015 1115   BUN 5* 12/28/2013 2331   CREATININE 0.97 03/07/2015 1115   CREATININE 1.11 12/28/2013 2331   CALCIUM 9.3 03/07/2015 1115   CALCIUM 9.0 12/28/2013 2331   PROT 7.6 12/23/2014 0224   PROT 7.6 12/28/2013 2331   ALBUMIN 4.4 12/23/2014 0224   ALBUMIN 3.8 12/28/2013 2331   AST 43* 12/23/2014 0224   AST 40* 12/28/2013 2331   ALT 73* 12/23/2014 0224   ALT 50 12/28/2013 2331   ALKPHOS 71 12/23/2014 0224   ALKPHOS 158* 12/28/2013 2331   BILITOT 0.7 12/23/2014 0224   BILITOT 0.2 12/28/2013 2331   GFRNONAA >60 03/07/2015 1115   GFRNONAA >60 12/28/2013 2331   GFRNONAA >60 04/17/2013 1543   GFRAA >60 03/07/2015 1115   GFRAA >60 12/28/2013 2331   GFRAA >60 04/17/2013 1543      Assessment & Plan:   1. Other  hyperparathyroidism (HCC) -Elevated PTH at 84 associated with high normal calcium at 10.1 indicates possibility of at least early mild normocalcemic  hyperparathyroidism. Secondary hyperparathyroidism is not ruled out. The rare condition of familial hypocalcuric hypercalcemia is not ruled out either. -Given his chronic nephrolithiasis he requires further study to determine the cause of elevated PTH. I would proceed to obtain serum levels of colon - Magnesium - Phosphorus - PTH, intact and calcium and vitamin D level. I'll request for a 4 hour urine calcium.  If he is confirmed to have primary hyperparathyroidism, he is a surgical candidate. After his next visit he will be considered for DEXA scan.  2. Calculus of kidney -likely related to #1 above. I advised him to continue follow up with urology.  He is extensively counseled against smoking. Unfortunately he doesn't seem to be ready  to quit.  - I advised patient to maintain close follow up with Jorge Nichols., MD for primary care needs. Follow up plan: Return in about 2 weeks (around 06/19/2015) for follow up with pre-visit labs.  Marquis Lunch, MD Phone: [phone]  Fax: [phone]   06/05/2015, 2:45 PM

## 2015-06-14 ENCOUNTER — Encounter (HOSPITAL_COMMUNITY): Payer: Self-pay | Admitting: Emergency Medicine

## 2015-06-14 ENCOUNTER — Ambulatory Visit (INDEPENDENT_AMBULATORY_CARE_PROVIDER_SITE_OTHER): Payer: BLUE CROSS/BLUE SHIELD | Admitting: Urology

## 2015-06-14 ENCOUNTER — Emergency Department (HOSPITAL_COMMUNITY)
Admission: EM | Admit: 2015-06-14 | Discharge: 2015-06-15 | Disposition: A | Discharge status: Home / Self Care | Payer: BLUE CROSS/BLUE SHIELD | Attending: Emergency Medicine | Admitting: Emergency Medicine

## 2015-06-14 DIAGNOSIS — E785 Hyperlipidemia, unspecified: Secondary | ICD-10-CM | POA: Diagnosis not present

## 2015-06-14 DIAGNOSIS — E213 Hyperparathyroidism, unspecified: Secondary | ICD-10-CM

## 2015-06-14 DIAGNOSIS — E790 Hyperuricemia without signs of inflammatory arthritis and tophaceous disease: Secondary | ICD-10-CM

## 2015-06-14 DIAGNOSIS — F17210 Nicotine dependence, cigarettes, uncomplicated: Secondary | ICD-10-CM | POA: Diagnosis not present

## 2015-06-14 DIAGNOSIS — I10 Essential (primary) hypertension: Secondary | ICD-10-CM | POA: Diagnosis not present

## 2015-06-14 DIAGNOSIS — N200 Calculus of kidney: Secondary | ICD-10-CM | POA: Insufficient documentation

## 2015-06-14 DIAGNOSIS — R109 Unspecified abdominal pain: Secondary | ICD-10-CM | POA: Diagnosis present

## 2015-06-14 LAB — CBC WITH DIFFERENTIAL/PLATELET
BASOS PCT: 1 %
Basophils Absolute: 0 10*3/uL (ref 0.0–0.1)
EOS ABS: 0.2 10*3/uL (ref 0.0–0.7)
EOS PCT: 3 %
HCT: 44.1 % (ref 39.0–52.0)
HEMOGLOBIN: 15.2 g/dL (ref 13.0–17.0)
Lymphocytes Relative: 30 %
Lymphs Abs: 2.3 10*3/uL (ref 0.7–4.0)
MCH: 32.6 pg (ref 26.0–34.0)
MCHC: 34.5 g/dL (ref 30.0–36.0)
MCV: 94.6 fL (ref 78.0–100.0)
MONOS PCT: 9 %
Monocytes Absolute: 0.7 10*3/uL (ref 0.1–1.0)
NEUTROS PCT: 57 %
Neutro Abs: 4.5 10*3/uL (ref 1.7–7.7)
PLATELETS: 275 10*3/uL (ref 150–400)
RBC: 4.66 MIL/uL (ref 4.22–5.81)
RDW: 12.4 % (ref 11.5–15.5)
WBC: 7.8 10*3/uL (ref 4.0–10.5)

## 2015-06-14 LAB — COMPREHENSIVE METABOLIC PANEL
ALK PHOS: 65 U/L (ref 38–126)
ALT: 38 U/L (ref 17–63)
ANION GAP: 11 (ref 5–15)
AST: 30 U/L (ref 15–41)
Albumin: 4.6 g/dL (ref 3.5–5.0)
BUN: 9 mg/dL (ref 6–20)
CALCIUM: 9.6 mg/dL (ref 8.9–10.3)
CHLORIDE: 105 mmol/L (ref 101–111)
CO2: 23 mmol/L (ref 22–32)
Creatinine, Ser: 1.06 mg/dL (ref 0.61–1.24)
GFR calc non Af Amer: >60 mL/min (ref >60)
Glucose, Bld: 90 mg/dL (ref 65–99)
Potassium: 3.5 mmol/L (ref 3.5–5.1)
SODIUM: 139 mmol/L (ref 135–145)
Total Bilirubin: 0.8 mg/dL (ref 0.3–1.2)
Total Protein: 7.8 g/dL (ref 6.5–8.1)

## 2015-06-14 LAB — URINALYSIS, ROUTINE W REFLEX MICROSCOPIC
BILIRUBIN URINE: NEGATIVE
Glucose, UA: NEGATIVE mg/dL
HGB URINE DIPSTICK: NEGATIVE
KETONES UR: 15 mg/dL — AB
Leukocytes, UA: NEGATIVE
NITRITE: NEGATIVE
PH: 7 (ref 5.0–8.0)
Protein, ur: NEGATIVE mg/dL
Specific Gravity, Urine: 1.02 (ref 1.005–1.030)

## 2015-06-14 NOTE — ED Notes (Signed)
Pt c/o rt flank pain and bladder pain. Pt has seen pcp for the same and started on flomax.

## 2015-06-15 ENCOUNTER — Emergency Department (HOSPITAL_COMMUNITY): Payer: BLUE CROSS/BLUE SHIELD

## 2015-06-15 LAB — VITAMIN D 25 HYDROXY (VIT D DEFICIENCY, FRACTURES): VIT D 25 HYDROXY: 14 ng/mL — AB (ref 30–100)

## 2015-06-15 LAB — PHOSPHORUS: Phosphorus: 4 mg/dL (ref 2.5–4.5)

## 2015-06-15 LAB — MAGNESIUM: MAGNESIUM: 1.9 mg/dL (ref 1.5–2.5)

## 2015-06-15 MED ORDER — KETOROLAC TROMETHAMINE 30 MG/ML IJ SOLN
30.0000 mg | Freq: Once | INTRAMUSCULAR | Status: AC
  Administered 2015-06-15: 30 mg via INTRAVENOUS
  Filled 2015-06-15: qty 1

## 2015-06-15 MED ORDER — CYCLOBENZAPRINE HCL 10 MG PO TABS
10.0000 mg | ORAL_TABLET | Freq: Once | ORAL | Status: AC
  Administered 2015-06-15: 10 mg via ORAL
  Filled 2015-06-15: qty 1

## 2015-06-15 MED ORDER — FENTANYL CITRATE (PF) 100 MCG/2ML IJ SOLN
50.0000 ug | Freq: Once | INTRAMUSCULAR | Status: AC
  Administered 2015-06-15: 50 ug via INTRAVENOUS
  Filled 2015-06-15: qty 2

## 2015-06-15 NOTE — Discharge Instructions (Signed)
You passed the stone tonight. You can take ibuprofen 600 mg and/or acetaminophen 1000 mg every 6 hrs as needed for pain.  Your pain should be much improved now that you have passed the stone.  Recheck if you get a fever, vomiting or the pain returns.

## 2015-06-15 NOTE — ED Provider Notes (Signed)
CSN: [id number]     Arrival date & time 06/14/15  1925 History  By signing my name below, I, Phillis HaggisGabriella Gaje, attest that this documentation has been prepared under the direction and in the presence of Devoria AlbeIva Dashton Czerwinski, MD at 0040. Electronically Signed: Phillis HaggisGabriella Gaje, ED Scribe. 06/15/2015. 12:48 AM.   Chief Complaint  Patient presents with  . Flank Pain   The history is provided by the patient. No language interpreter was used.  HPI Comments: Jorge Nichols is a 25 y.o. Male with a hx of kidney stones, lithotripsy, HTN, and thyroid disease who presents to the Emergency Department complaining of sharp, gradually worsening right flank pain and bladder pain onset 2 days ago. He reports associated hematuria, bladder pressure, and intermittent nausea. Pt has seen his PCP for this problem and was prescribed Flomax one month ago. Pt is due to have a CT scan after he is on the Flomax for two months. He states that he called his urologist who told him that he will most likely pass a stone. Pt reports worsening pain in his groin with walking and relief with sitting with his legs apart. He states it feels like "razor blades".  He currently does not have back pain or abdominal pain. Pt is an occasional smoker and drinker, 1 pack of cigarettes and 24 beers per week. He denies fever, chills, nausea,vomiting, testicular pain or swelling.    Urologist: [facility]  Past Medical History  Diagnosis Date  . Hypertension   . Hyperlipidemia   . Chronic headaches   . Anxiety   . Kidney stones   . Kidney stones   . Thyroid disease    Past Surgical History  Procedure Laterality Date  . Lithotripsy    . Joint replacement    . Total hip arthroplasty     Family History  Problem Relation Age of Onset  . Hypertension Father    Social History  Substance Use Topics  . Smoking status: Current Every Day Smoker -- 0.50 packs/day  . Smokeless tobacco: Current User    Types: Snuff  . Alcohol Use: 3.0 oz/week     5 Cans of beer per week     Comment: occassionally   patient smokes 1 pack per week Patient states he can drink 24 beers in a weekend. Patient is on disability due to hip replacements  Review of Systems  Gastrointestinal: Positive for nausea. Negative for vomiting and abdominal pain.  Genitourinary: Positive for hematuria and flank pain. Negative for scrotal swelling.  Musculoskeletal: Negative for back pain.  All other systems reviewed and are negative.     Allergies  Review of patient's allergies indicates no known allergies.  Home Medications   Prior to Admission medications   Medication Sig Start Date End Date Taking? Authorizing Provider  ALPRAZolam Prudy Feeler(XANAX) 0.5 MG tablet Take 0.5 mg by mouth at bedtime as needed for anxiety.     Historical Provider, MD  cyclobenzaprine (FLEXERIL) 10 MG tablet Take 1 tablet (10 mg total) by mouth 3 (three) times daily as needed for muscle spasms. 04/07/15   Devoria AlbeIva Conleigh Heinlein, MD  HYDROcodone-acetaminophen (NORCO/VICODIN) 5-325 MG tablet Take 2 tablets by mouth every 4 (four) hours as needed for moderate pain. 03/17/15   Eber HongBrian Miller, MD  ibuprofen (ADVIL,MOTRIN) 800 MG tablet Take 1 tablet (800 mg total) by mouth 3 (three) times daily. 03/17/15   Eber HongBrian Miller, MD  metoprolol (LOPRESSOR) 50 MG tablet Take 50 mg by mouth 2 (two) times daily.  Historical Provider, MD  naproxen (NAPROSYN) 500 MG tablet Take 1 tablet (500 mg total) by mouth 2 (two) times daily. 04/07/15   Devoria Albe, MD  oxyCODONE-acetaminophen (PERCOCET) 5-325 MG tablet Take 1 tablet by mouth every 4 (four) hours as needed for moderate pain. 05/19/15   Dione Booze, MD  promethazine (PHENERGAN) 25 MG tablet Take 1 tablet (25 mg total) by mouth every 6 (six) hours as needed for nausea or vomiting. 05/19/15   Dione Booze, MD  tamsulosin (FLOMAX) 0.4 MG CAPS capsule Take 1 po QD until you pass the stone. Patient taking differently: Take 0.4 mg by mouth daily after breakfast. Take 1 po QD until you  pass the stone. 04/07/15   Devoria Albe, MD  traMADol (ULTRAM) 50 MG tablet Take 2 tablets (100 mg total) by mouth every 6 (six) hours as needed. Patient taking differently: Take 50 mg by mouth every 6 (six) hours as needed.  04/07/15   Devoria Albe, MD   BP 135/94 mmHg  Pulse 93  Temp(Src) 98.5 F (36.9 C) (Oral)  Resp 18  Ht 5\' 8"  (1.727 m)  Wt 260 lb (117.935 kg)  BMI 39.54 kg/m2  SpO2 98%  Vital signs normal   Physical Exam  Constitutional: He is oriented to person, place, and time. He appears well-developed and well-nourished.  Non-toxic appearance. He does not appear ill. No distress.  HENT:  Head: Normocephalic and atraumatic.  Right Ear: External ear normal.  Left Ear: External ear normal.  Nose: Nose normal. No mucosal edema or rhinorrhea.  Mouth/Throat: Oropharynx is clear and moist and mucous membranes are normal. No dental abscesses or uvula swelling.  Eyes: Conjunctivae and EOM are normal. Pupils are equal, round, and reactive to light.  Neck: Normal range of motion and full passive range of motion without pain. Neck supple.  Cardiovascular: Normal rate, regular rhythm and normal heart sounds.  Exam reveals no gallop and no friction rub.   No murmur heard. Pulmonary/Chest: Effort normal and breath sounds normal. No respiratory distress. He has no wheezes. He has no rhonchi. He has no rales. He exhibits no tenderness and no crepitus.  Abdominal: Soft. Normal appearance and bowel sounds are normal. He exhibits no distension. There is no tenderness. There is no rebound and no guarding.  Genitourinary:  No epididymal tenderness. He has normal testicles that are nontender to palpation. His penis is normal. Patient points to an area in his posterior scrotum that he states he feels like there is a stone there, however I do not feel anything there. There was nursing chaperone in the room  Musculoskeletal: Normal range of motion. He exhibits no edema or tenderness.  Moves all extremities  well.   Neurological: He is alert and oriented to person, place, and time. He has normal strength. No cranial nerve deficit.  Skin: Skin is warm, dry and intact. No rash noted. No erythema. No pallor.  Psychiatric: He has a normal mood and affect. His speech is normal and behavior is normal. His mood appears not anxious.  Nursing note and vitals reviewed.   ED Course  Procedures (including critical care time)  Medications  ketorolac (TORADOL) 30 MG/ML injection 30 mg (30 mg Intravenous Given 06/15/15 0057)  fentaNYL (SUBLIMAZE) injection 50 mcg (50 mcg Intravenous Given 06/15/15 0057)  cyclobenzaprine (FLEXERIL) tablet 10 mg (10 mg Oral Given 06/15/15 0241)    DIAGNOSTIC STUDIES: Oxygen Saturation is 100% on RA, normal by my interpretation.    COORDINATION OF CARE: 12:45 AM-Discussed  treatment plan which includes labs, Toradol, x-ray and Fentanyl with pt at bedside and pt agreed to plan.    Patient was rechecked at 2:10 AM. At this point he stated he felt a stone in his scrotum. GU exam was done at this time. He states his pain was doing well however starting to return.  3 AM patient had urinated in the urinal and he passed a stone. He states he's feeling improved.  Patient was discharged home. He should be able to take just Motrin or Tylenol now for any residual achiness after passing the stone. He should be rechecked if he gets a fever, vomiting, hours pain gets worse.   Labs Review Results for orders placed or performed during the hospital encounter of 06/14/15  Urinalysis, Routine w reflex microscopic (not at [facility])  Result Value Ref Range   Color, Urine YELLOW YELLOW   APPearance CLEAR CLEAR   Specific Gravity, Urine 1.020 1.005 - 1.030   pH 7.0 5.0 - 8.0   Glucose, UA NEGATIVE NEGATIVE mg/dL   Hgb urine dipstick NEGATIVE NEGATIVE   Bilirubin Urine NEGATIVE NEGATIVE   Ketones, ur 15 (A) NEGATIVE mg/dL   Protein, ur NEGATIVE NEGATIVE mg/dL   Nitrite NEGATIVE NEGATIVE    Leukocytes, UA NEGATIVE NEGATIVE  CBC with Differential  Result Value Ref Range   WBC 7.8 4.0 - 10.5 K/uL   RBC 4.66 4.22 - 5.81 MIL/uL   Hemoglobin 15.2 13.0 - 17.0 g/dL   HCT 82.4 23.5 - 36.1 %   MCV 94.6 78.0 - 100.0 fL   MCH 32.6 26.0 - 34.0 pg   MCHC 34.5 30.0 - 36.0 g/dL   RDW 44.3 15.4 - 00.8 %   Platelets 275 150 - 400 K/uL   Neutrophils Relative % 57 %   Neutro Abs 4.5 1.7 - 7.7 K/uL   Lymphocytes Relative 30 %   Lymphs Abs 2.3 0.7 - 4.0 K/uL   Monocytes Relative 9 %   Monocytes Absolute 0.7 0.1 - 1.0 K/uL   Eosinophils Relative 3 %   Eosinophils Absolute 0.2 0.0 - 0.7 K/uL   Basophils Relative 1 %   Basophils Absolute 0.0 0.0 - 0.1 K/uL  Comprehensive metabolic panel  Result Value Ref Range   Sodium 139 135 - 145 mmol/L   Potassium 3.5 3.5 - 5.1 mmol/L   Chloride 105 101 - 111 mmol/L   CO2 23 22 - 32 mmol/L   Glucose, Bld 90 65 - 99 mg/dL   BUN 9 6 - 20 mg/dL   Creatinine, Ser 6.76 0.61 - 1.24 mg/dL   Calcium 9.6 8.9 - 19.5 mg/dL   Total Protein 7.8 6.5 - 8.1 g/dL   Albumin 4.6 3.5 - 5.0 g/dL   AST 30 15 - 41 U/L   ALT 38 17 - 63 U/L   Alkaline Phosphatase 65 38 - 126 U/L   Total Bilirubin 0.8 0.3 - 1.2 mg/dL   GFR calc non Af Amer >60 >60 mL/min   GFR calc Af Amer >60 >60 mL/min   Anion gap 11 5 - 15   Laboratory interpretation all normal    Imaging Review Dg Abd 1 View  06/15/2015  CLINICAL DATA:  Right flank pain for 3 days. History of kidney stones. EXAM: ABDOMEN - 1 VIEW COMPARISON:  CT 05/02/2015 FINDINGS: Several calcifications are projected over both kidneys consistent with bilateral intrarenal stones. No stones demonstrated along the course of the ureters. Bowel gas pattern is normal with scattered stool and gas  in the colon. Bilateral hip arthroplasties. IMPRESSION: Multiple stones projected over both kidneys. Electronically Signed   By: Burman Nieves M.D.   On: 06/15/2015 01:24   I have personally reviewed and evaluated these images and lab  results as part of my medical decision-making.    MDM   Final diagnoses:  Renal stone    Plan discharge   I personally performed the services described in this documentation, which was scribed in my presence. The recorded information has been reviewed and considered.  Devoria Albe, MD, Concha Pyo, MD 06/15/15 [phone]

## 2015-06-17 LAB — PTH, INTACT AND CALCIUM
Calcium: 9.7 mg/dL (ref 8.4–10.5)
PTH: 51 pg/mL (ref 14–64)

## 2015-06-21 ENCOUNTER — Ambulatory Visit (INDEPENDENT_AMBULATORY_CARE_PROVIDER_SITE_OTHER): Payer: BLUE CROSS/BLUE SHIELD | Admitting: "Endocrinology

## 2015-06-21 ENCOUNTER — Encounter: Payer: Self-pay | Admitting: "Endocrinology

## 2015-06-21 VITALS — BP 123/80 | HR 62 | Ht 68.0 in | Wt 274.0 lb

## 2015-06-21 DIAGNOSIS — E559 Vitamin D deficiency, unspecified: Secondary | ICD-10-CM

## 2015-06-21 DIAGNOSIS — E212 Other hyperparathyroidism: Secondary | ICD-10-CM

## 2015-06-21 MED ORDER — VITAMIN D (ERGOCALCIFEROL) 1.25 MG (50000 UNIT) PO CAPS: 50000.0000 [IU] | ORAL_CAPSULE | ORAL | Status: DC

## 2015-06-21 NOTE — Progress Notes (Signed)
Subjective:    Patient ID: Jorge Nichols, male    DOB: 02-24-1991, PCP Cassell Smiles., MD   Past Medical History  Diagnosis Date  . Hypertension   . Hyperlipidemia   . Chronic headaches   . Anxiety   . Kidney stones   . Kidney stones   . Thyroid disease    Past Surgical History  Procedure Laterality Date  . Lithotripsy    . Joint replacement    . Total hip arthroplasty     Social History   Social History  . Marital Status: Married    Spouse Name: N/A  . Number of Children: N/A  . Years of Education: N/A   Social History Main Topics  . Smoking status: Current Every Day Smoker -- 0.50 packs/day  . Smokeless tobacco: Current User    Types: Snuff  . Alcohol Use: 3.0 oz/week    5 Cans of beer per week     Comment: occassionally  . Drug Use: No  . Sexual Activity: Not Asked   Other Topics Concern  . None   Social History Narrative   Outpatient Encounter Prescriptions as of 06/21/2015  Medication Sig  . ALPRAZolam (XANAX) 0.5 MG tablet Take 0.5 mg by mouth at bedtime as needed for anxiety.   . cyclobenzaprine (FLEXERIL) 10 MG tablet Take 1 tablet (10 mg total) by mouth 3 (three) times daily as needed for muscle spasms.  Marland Kitchen HYDROcodone-acetaminophen (NORCO/VICODIN) 5-325 MG tablet Take 2 tablets by mouth every 4 (four) hours as needed for moderate pain.  Marland Kitchen ibuprofen (ADVIL,MOTRIN) 800 MG tablet Take 1 tablet (800 mg total) by mouth 3 (three) times daily.  . metoprolol (LOPRESSOR) 50 MG tablet Take 50 mg by mouth 2 (two) times daily.  . naproxen (NAPROSYN) 500 MG tablet Take 1 tablet (500 mg total) by mouth 2 (two) times daily.  Marland Kitchen oxyCODONE-acetaminophen (PERCOCET) 5-325 MG tablet Take 1 tablet by mouth every 4 (four) hours as needed for moderate pain.  . promethazine (PHENERGAN) 25 MG tablet Take 1 tablet (25 mg total) by mouth every 6 (six) hours as needed for nausea or vomiting.  . tamsulosin (FLOMAX) 0.4 MG CAPS capsule Take 1 po QD until you pass the stone.  (Patient taking differently: Take 0.4 mg by mouth daily after breakfast. Take 1 po QD until you pass the stone.)  . traMADol (ULTRAM) 50 MG tablet Take 2 tablets (100 mg total) by mouth every 6 (six) hours as needed. (Patient taking differently: Take 50 mg by mouth every 6 (six) hours as needed. )  . Vitamin D, Ergocalciferol, (DRISDOL) 50000 units CAPS capsule Take 1 capsule (50,000 Units total) by mouth every 7 (seven) days.   No facility-administered encounter medications on file as of 06/21/2015.   ALLERGIES: No Known Allergies VACCINATION STATUS:  There is no immunization history on file for this patient.  HPI  Jorge Nichols is 25 year old male with medical history as above. He is returning for follow-up after repeat PTH/calcium. He was supposed to have 24-hour urine calcium, however this test was not completed. Medical history significant for nephrolithiasis starting from age 77. Per his report he passes stone every month, underwent lithotripsy at least once, and stone retrieval at least on 2 occasions. He denies history of hypercalcemia nor any intervention for hypercalcemia in the past. He denies family history of similar condition. He denies history of frequent urinary tract infections. He does not recall if he had 24-hour urine study in the past. He  has history of avascular necrosis of bilateral hip status post bilateral hip replacement. He is a chronic active smoker. He denies any neck swelling. He does not recall if he had any bone density measurement. Eyes any history of trivial bone fracture. He reports being overweight/obese most of his adult life. His intake of dairy products is average. He is not taking any calcium supplements. View of his medical records shows absence of hypercalcemia. Highest calcium he ever had such appears to be 10.1 associated with elevated PTH of 84 included in his referral package.  -On repeat blood work BG is improved to 51 low with calcium normal at  9.6.  Review of Systems Constitutional: + heavy weight , no fatigue, no subjective hyperthermia/hypothermia Eyes: no blurry vision, no xerophthalmia ENT: no sore throat, no nodules palpated in throat, no dysphagia/odynophagia, no hoarseness Cardiovascular: no CP/SOB/palpitations/leg swelling Respiratory: no cough/SOB Gastrointestinal: no N/V/D/C Musculoskeletal: no muscle/joint aches Skin: no rashes Neurological: no tremors/numbness/tingling/dizziness Psychiatric: no depression/anxiety  Objective:    BP 123/80 mmHg  Pulse 62  Ht  (1.727 m)  Wt 274 lb (124.286 kg)  BMI 41.67 kg/m2  SpO2 98%  Wt Readings from Last 3 Encounters:  06/21/15 274 lb (124.286 kg)  06/14/15 260 lb (117.935 kg)  06/05/15 276 lb (125.193 kg)    Physical Exam\  Constitutional: obese, in NAD Eyes: PERRLA, EOMI, no exophthalmos ENT: moist mucous membranes, no thyromegaly, no cervical lymphadenopathy Cardiovascular: RRR, No MRG Respiratory: CTA B Gastrointestinal: abdomen soft, obese, NT, ND, BS+ Musculoskeletal: no deformities, strength intact in all 4 Skin: moist, warm, no rashes Neurological: no tremor with outstretched hands, DTR normal in all 4   CMP     Component Value Date/Time   NA 139 06/14/2015 2219   NA 144 12/28/2013 2331   K 3.5 06/14/2015 2219   K 3.6 12/28/2013 2331   CL 105 06/14/2015 2219   CL 106 12/28/2013 2331   CO2 23 06/14/2015 2219   CO2 28 12/28/2013 2331   GLUCOSE 90 06/14/2015 2219   GLUCOSE 91 12/28/2013 2331   BUN 9 06/14/2015 2219   BUN 5* 12/28/2013 2331   CREATININE 1.06 06/14/2015 2219   CREATININE 1.11 12/28/2013 2331   CALCIUM 9.6 06/14/2015 2219   CALCIUM 9.0 12/28/2013 2331   PROT 7.8 06/14/2015 2219   PROT 7.6 12/28/2013 2331   ALBUMIN 4.6 06/14/2015 2219   ALBUMIN 3.8 12/28/2013 2331   AST 30 06/14/2015 2219   AST 40* 12/28/2013 2331   ALT 38 06/14/2015 2219   ALT 50 12/28/2013 2331   ALKPHOS 65 06/14/2015 2219   ALKPHOS 158* 12/28/2013  2331   BILITOT 0.8 06/14/2015 2219   BILITOT 0.2 12/28/2013 2331   GFRNONAA >60 06/14/2015 2219   GFRNONAA >60 12/28/2013 2331   GFRNONAA >60 04/17/2013 1543   GFRAA >60 06/14/2015 2219   GFRAA >60 12/28/2013 2331   GFRAA >60 04/17/2013 1543    Vitamin D low at 14 on 06/14/2015.  Assessment & Plan:   1. Other hyperparathyroidism (HCC) 2.  vitamin D deficiency. - PTH is improved to 51 from  84 associated with   better calcium at 9.6. He has history of high normal calcium at 10.1 indicates possibility of at least early mild normocalcemic  hyperparathyroidism. Secondary hyperparathyroidism  as a result of severe vitamin D deficiency is contributing.  I will proceed to replace vitamin D for the next 16 weeks.  I would obtain 24-hour urine calcium before his next visit in 4  months.  The rare condition of familial hypocalcuric hypercalcemia is not ruled out .  - Along with 24 hour urine calcium, I will also repeat PTH, intact and calcium.  If he is confirmed to have primary hyperparathyroidism, he is a surgical candidate. After his next visit he will be considered for DEXA scan.  2. Calculus of kidney -likely related to #1 above. I advised him to continue follow up with urology.  He is extensively counseled against smoking. Unfortunately he doesn't seem to be ready to quit.  - I advised patient to maintain close follow up with Cassell SmilesFUSCO,LAWRENCE J., MD for primary care needs. Follow up plan: Return in about 4 months (around 10/21/2015) for Hyperparathyroidism.  Marquis LunchGebre Mehgan Santmyer, MD Phone: [phone]  Fax: [phone]   06/21/2015, 3:52 PM

## 2015-07-02 ENCOUNTER — Ambulatory Visit (INDEPENDENT_AMBULATORY_CARE_PROVIDER_SITE_OTHER): Payer: BLUE CROSS/BLUE SHIELD | Admitting: Urology

## 2015-07-02 DIAGNOSIS — N201 Calculus of ureter: Secondary | ICD-10-CM | POA: Diagnosis not present

## 2015-07-10 ENCOUNTER — Ambulatory Visit: Payer: BLUE CROSS/BLUE SHIELD | Admitting: Urology

## 2015-07-13 ENCOUNTER — Encounter: Payer: Self-pay | Admitting: Emergency Medicine

## 2015-07-13 DIAGNOSIS — Z791 Long term (current) use of non-steroidal anti-inflammatories (NSAID): Secondary | ICD-10-CM | POA: Insufficient documentation

## 2015-07-13 DIAGNOSIS — M549 Dorsalgia, unspecified: Secondary | ICD-10-CM | POA: Insufficient documentation

## 2015-07-13 DIAGNOSIS — I10 Essential (primary) hypertension: Secondary | ICD-10-CM | POA: Diagnosis not present

## 2015-07-13 DIAGNOSIS — Z79899 Other long term (current) drug therapy: Secondary | ICD-10-CM | POA: Insufficient documentation

## 2015-07-13 DIAGNOSIS — Z720 Tobacco use: Secondary | ICD-10-CM | POA: Insufficient documentation

## 2015-07-13 DIAGNOSIS — N200 Calculus of kidney: Secondary | ICD-10-CM | POA: Insufficient documentation

## 2015-07-13 DIAGNOSIS — E785 Hyperlipidemia, unspecified: Secondary | ICD-10-CM | POA: Diagnosis not present

## 2015-07-13 DIAGNOSIS — R109 Unspecified abdominal pain: Secondary | ICD-10-CM | POA: Diagnosis present

## 2015-07-13 LAB — CBC
HEMATOCRIT: 45.2 % (ref 40.0–52.0)
HEMOGLOBIN: 15.5 g/dL (ref 13.0–18.0)
MCH: 32.7 pg (ref 26.0–34.0)
MCHC: 34.3 g/dL (ref 32.0–36.0)
MCV: 95.3 fL (ref 80.0–100.0)
Platelets: 256 10*3/uL (ref 150–440)
RBC: 4.74 MIL/uL (ref 4.40–5.90)
RDW: 13.5 % (ref 11.5–14.5)
WBC: 9.6 10*3/uL (ref 3.8–10.6)

## 2015-07-13 MED ORDER — ONDANSETRON HCL 4 MG/2ML IJ SOLN
4.0000 mg | Freq: Once | INTRAMUSCULAR | Status: AC
  Administered 2015-07-13: 4 mg via INTRAVENOUS
  Filled 2015-07-13: qty 2

## 2015-07-13 MED ORDER — FENTANYL CITRATE (PF) 100 MCG/2ML IJ SOLN
50.0000 ug | INTRAMUSCULAR | Status: DC | PRN
  Administered 2015-07-13: 50 ug via INTRAVENOUS
  Filled 2015-07-13: qty 2

## 2015-07-13 NOTE — ED Notes (Signed)
Patient reports that he developed right lower back pain that started about 2 days ago. Patient reports that today the pain is in his groin. Patient reports that he has a history of kidney stones.

## 2015-07-14 ENCOUNTER — Emergency Department
Admission: EM | Admit: 2015-07-14 | Discharge: 2015-07-14 | Disposition: A | Discharge status: Home / Self Care | Payer: BLUE CROSS/BLUE SHIELD | Attending: Emergency Medicine | Admitting: Emergency Medicine

## 2015-07-14 DIAGNOSIS — N200 Calculus of kidney: Secondary | ICD-10-CM

## 2015-07-14 LAB — URINALYSIS COMPLETE WITH MICROSCOPIC (ARMC ONLY)
BILIRUBIN URINE: NEGATIVE
Bacteria, UA: NONE SEEN
GLUCOSE, UA: NEGATIVE mg/dL
KETONES UR: NEGATIVE mg/dL
LEUKOCYTES UA: NEGATIVE
Nitrite: NEGATIVE
PH: 5 (ref 5.0–8.0)
Protein, ur: NEGATIVE mg/dL
Specific Gravity, Urine: 1.021 (ref 1.005–1.030)

## 2015-07-14 MED ORDER — FENTANYL CITRATE (PF) 100 MCG/2ML IJ SOLN
50.0000 ug | Freq: Once | INTRAMUSCULAR | Status: AC
  Administered 2015-07-14: 50 ug via INTRAVENOUS

## 2015-07-14 MED ORDER — OXYCODONE-ACETAMINOPHEN 5-325 MG PO TABS: 1.0000 | ORAL_TABLET | Freq: Four times a day (QID) | ORAL | Status: DC | PRN

## 2015-07-14 MED ORDER — SODIUM CHLORIDE 0.9 % IV BOLUS (SEPSIS)
1000.0000 mL | Freq: Once | INTRAVENOUS | Status: AC
  Administered 2015-07-14: 1000 mL via INTRAVENOUS

## 2015-07-14 MED ORDER — KETOROLAC TROMETHAMINE 30 MG/ML IJ SOLN
30.0000 mg | Freq: Once | INTRAMUSCULAR | Status: AC
  Administered 2015-07-14: 30 mg via INTRAVENOUS
  Filled 2015-07-14: qty 1

## 2015-07-14 MED ORDER — MORPHINE SULFATE (PF) 4 MG/ML IV SOLN
4.0000 mg | Freq: Once | INTRAVENOUS | Status: AC
  Administered 2015-07-14: 4 mg via INTRAVENOUS
  Filled 2015-07-14: qty 1

## 2015-07-14 NOTE — ED Provider Notes (Signed)
[facility] Emergency Department Provider Note  ____________________________________________  Time seen: Approximately 354 AM  I have reviewed the triage vital signs and the nursing notes.   HISTORY  Chief Complaint Flank Pain    HPI Jorge Nichols is a 25 y.o. male who comes into the hospital today with kidney stones. The patient has a history of kidney stones and reports that this pain started a day and a half ago. He is having pain to the right side. 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 The patient takes Flomax daily and passes 1-2 stones per month. His renal doctors in [place] but he does not remember his name. He was post to see him on Wednesday but the doctor was not there. The patient's pain is a 7 out of 10 in intensity. He is having nausea and he did vomit earlier today. He's had some blood in his urination but denies any pain with urination. He reports that he is able to feel the stone moving towards his groin. He is here for evaluation and pain control.He reports he feels like he sitting on razor blades. He's had no fevers or chills, no testicular swelling or pain.   Past Medical History  Diagnosis Date  . Hypertension   . Hyperlipidemia   . Chronic headaches   . Anxiety   . Kidney stones   . Kidney stones   . Thyroid disease     Patient Active Problem List   Diagnosis Date Noted  . Vitamin D deficiency 06/21/2015  . Other hyperparathyroidism (HCC) 06/05/2015  . BP (high blood pressure) 05/13/2015  . Calculus of kidney 05/13/2015  . Arthralgia of hip 12/04/2013  . Iliopsoas bursitis 12/04/2013  . History of repair of hip joint 07/03/2013  . Avascular necrosis of bone (HCC) 03/20/2013  . Mixed dyslipidemia 09/09/2012  . Metabolic syndrome 09/09/2012  . Morbid obesity (HCC) 09/09/2012  . Sleep disorder breathing 09/09/2012  . Family history of premature CAD 09/09/2012    Past Surgical History   Procedure Laterality Date  . Lithotripsy    . Joint replacement    . Total hip arthroplasty      Current Outpatient Rx  Name  Route  Sig  Dispense  Refill  . ALPRAZolam (XANAX) 0.5 MG tablet   Oral   Take 0.5 mg by mouth at bedtime as needed for anxiety.          Marland Kitchen ibuprofen (ADVIL,MOTRIN) 800 MG tablet   Oral   Take 1 tablet (800 mg total) by mouth 3 (three) times daily. Patient taking differently: Take 800 mg by mouth 3 (three) times daily as needed for mild pain.    21 tablet   0   . metoprolol (LOPRESSOR) 50 MG tablet   Oral   Take 50 mg by mouth 2 (two) times daily.         . naproxen (NAPROSYN) 500 MG tablet   Oral   Take 1 tablet (500 mg total) by mouth 2 (two) times daily. Patient taking differently: Take 500 mg by mouth 2 (two) times daily as needed for mild pain.    30 tablet   0   . potassium citrate (UROCIT-K) 10 MEQ (1080 MG) SR tablet   Oral   Take 10 mEq by mouth 3 (three) times daily.      3   . tamsulosin (FLOMAX) 0.4 MG CAPS capsule      Take 1 po QD until  you pass the stone. Patient taking differently: Take 0.4 mg by mouth daily after breakfast. Take 1 po QD until you pass the stone.   10 capsule   0   . Vitamin D, Ergocalciferol, (DRISDOL) 50000 units CAPS capsule   Oral   Take 1 capsule (50,000 Units total) by mouth every 7 (seven) days.   16 capsule   0   . oxyCODONE-acetaminophen (PERCOCET) 5-325 MG tablet   Oral   Take 1 tablet by mouth every 4 (four) hours as needed for moderate pain.   10 tablet   0   . oxyCODONE-acetaminophen (ROXICET) 5-325 MG tablet   Oral   Take 1 tablet by mouth every 6 (six) hours as needed.   12 tablet   0     Allergies Review of patient's allergies indicates no known allergies.  Family History  Problem Relation Age of Onset  . Hypertension Father     Social History Social History  Substance Use Topics  . Smoking status: Current Every Day Smoker -- 0.50 packs/day  . Smokeless tobacco:  Current User    Types: Snuff  . Alcohol Use: 3.0 oz/week    5 Cans of beer per week     Comment: occassionally    Review of Systems Constitutional: No fever/chills Eyes: No visual changes. ENT: No sore throat. Cardiovascular: Denies chest pain. Respiratory: Denies shortness of breath. Gastrointestinal:  vomiting.  No diarrhea.  No constipation. Genitourinary: Negative for dysuria. Musculoskeletal: Right back pain. Skin: Negative for rash. Neurological: Negative for headaches, focal weakness or numbness.  10-point ROS otherwise negative.  ____________________________________________   PHYSICAL EXAM:  VITAL SIGNS: ED Triage Vitals  Enc Vitals Group     BP 07/13/15 2323 134/84 mmHg     Pulse Rate 07/13/15 2323 91     Resp 07/13/15 2323 22     Temp 07/13/15 2323 98.3 F (36.8 C)     Temp Source 07/13/15 2323 Oral     SpO2 07/13/15 2323 98 %     Weight 07/13/15 2323 260 lb (117.935 kg)     Height 07/13/15 2323  (1.727 m)     Head Cir --      Peak Flow --      Pain Score 07/13/15 2324 9     Pain Loc --      Pain Edu? --      Excl. in GC? --     Constitutional: Alert and oriented. Well appearing and in Moderate distress. Eyes: Conjunctivae are normal. PERRL. EOMI. Head: Atraumatic. Nose: No congestion/rhinnorhea. Mouth/Throat: Mucous membranes are moist.  Oropharynx non-erythematous. Cardiovascular: Normal rate, regular rhythm. Grossly normal heart sounds.  Good peripheral circulation. Respiratory: Normal respiratory effort.  No retractions. Lungs CTAB. Gastrointestinal: Soft and nontender. No distention. Positive bowel sounds, right flank pain Musculoskeletal: No lower extremity tenderness nor edema.   Neurologic:  Normal speech and language.  Skin:  Skin is warm, dry and intact.  Psychiatric: Mood and affect are normal.   ____________________________________________   LABS (all labs ordered are listed, but only abnormal results are displayed)  Labs  Reviewed  URINALYSIS COMPLETEWITH MICROSCOPIC ([facility] ONLY) - Abnormal; Notable for the following:    Color, Urine YELLOW (*)    APPearance CLEAR (*)    Hgb urine dipstick 3+ (*)    Squamous Epithelial / LPF 0-5 (*)    All other components within normal limits  CBC  COMPREHENSIVE METABOLIC PANEL   ____________________________________________  EKG  None ____________________________________________  RADIOLOGY  None  ____________________________________________   PROCEDURES  Procedure(s) performed: None  Critical Care performed: No  ____________________________________________   INITIAL IMPRESSION / ASSESSMENT AND PLAN / ED COURSE  Pertinent labs & imaging results that were available during my care of the patient were reviewed by me and considered in my medical decision making (see chart for details).  This is a 25 year old male who comes into the hospital today with some right flank pain. The patient does have a history of kidney stones and reports that this feels like his kidney stones. He does have too numerous to count red blood cells in his urine. He was given 2 doses of fentanyl 50 g as well as a dose of Toradol. I subsequently gave him a dose of morphine and the patient passed his stone with some fluids. The patient reports that he feels much better. He will be encouraged to follow-up with his urologist and discharged from the ED. The patient did show me his passed stone in his urinal. ____________________________________________   FINAL CLINICAL IMPRESSION(S) / ED DIAGNOSES  Final diagnoses:  Kidney stone      Rebecka ApleyAllison P Sherran Margolis, MD 07/14/15 0530

## 2015-07-14 NOTE — Discharge Instructions (Signed)
Kidney Stones °Kidney stones (urolithiasis) are deposits that form inside your kidneys. The intense pain is caused by the stone moving through the urinary tract. When the stone moves, the ureter goes into spasm around the stone. The stone is usually passed in the urine.  °CAUSES  °· A disorder that makes certain neck glands produce too much parathyroid hormone (primary hyperparathyroidism). °· A buildup of uric acid crystals, similar to gout in your joints. °· Narrowing (stricture) of the ureter. °· A kidney obstruction present at birth (congenital obstruction). °· Previous surgery on the kidney or ureters. °· Numerous kidney infections. °SYMPTOMS  °· Feeling sick to your stomach (nauseous). °· Throwing up (vomiting). °· Blood in the urine (hematuria). °· Pain that usually spreads (radiates) to the groin. °· Frequency or urgency of urination. °DIAGNOSIS  °· Taking a history and physical exam. °· Blood or urine tests. °· CT scan. °· Occasionally, an examination of the inside of the urinary bladder (cystoscopy) is performed. °TREATMENT  °· Observation. °· Increasing your fluid intake. °· Extracorporeal shock wave lithotripsy--This is a noninvasive procedure that uses shock waves to break up kidney stones. °· Surgery may be needed if you have severe pain or persistent obstruction. There are various surgical procedures. Most of the procedures are performed with the use of small instruments. Only small incisions are needed to accommodate these instruments, so recovery time is minimized. °The size, location, and chemical composition are all important variables that will determine the proper choice of action for you. Talk to your health care provider to better understand your situation so that you will minimize the risk of injury to yourself and your kidney.  °HOME CARE INSTRUCTIONS  °· Drink enough water and fluids to keep your urine clear or pale yellow. This will help you to pass the stone or stone fragments. °· Strain  all urine through the provided strainer. Keep all particulate matter and stones for your health care provider to see. The stone causing the pain may be as small as a grain of salt. It is very important to use the strainer each and every time you pass your urine. The collection of your stone will allow your health care provider to analyze it and verify that a stone has actually passed. The stone analysis will often identify what you can do to reduce the incidence of recurrences. °· Only take over-the-counter or prescription medicines for pain, discomfort, or fever as directed by your health care provider. °· Keep all follow-up visits as told by your health care provider. This is important. °· Get follow-up X-rays if required. The absence of pain does not always mean that the stone has passed. It may have only stopped moving. If the urine remains completely obstructed, it can cause loss of kidney function or even complete destruction of the kidney. It is your responsibility to make sure X-rays and follow-ups are completed. Ultrasounds of the kidney can show blockages and the status of the kidney. Ultrasounds are not associated with any radiation and can be performed easily in a matter of minutes. °· Make changes to your daily diet as told by your health care provider. You may be told to: °¨ Limit the amount of salt that you eat. °¨ Eat 5 or more servings of fruits and vegetables each day. °¨ Limit the amount of meat, poultry, fish, and eggs that you eat. °· Collect a 24-hour urine sample as told by your health care provider. You may need to collect another urine sample every 6-12   months. °SEEK MEDICAL CARE IF: °· You experience pain that is progressive and unresponsive to any pain medicine you have been prescribed. °SEEK IMMEDIATE MEDICAL CARE IF:  °· Pain cannot be controlled with the prescribed medicine. °· You have a fever or shaking chills. °· The severity or intensity of pain increases over 18 hours and is not  relieved by pain medicine. °· You develop a new onset of abdominal pain. °· You feel faint or pass out. °· You are unable to urinate. °  °This information is not intended to replace advice given to you by your health care provider. Make sure you discuss any questions you have with your health care provider. °  °Document Released: 03/09/2005 Document Revised: 11/28/2014 Document Reviewed: 08/10/2012 °[employer] Interactive Patient Education ©2016 [employer] ° °

## 2015-07-25 DIAGNOSIS — J069 Acute upper respiratory infection, unspecified: Secondary | ICD-10-CM | POA: Diagnosis not present

## 2015-07-31 ENCOUNTER — Emergency Department: Payer: Medicare Other

## 2015-07-31 ENCOUNTER — Encounter: Payer: Self-pay | Admitting: Emergency Medicine

## 2015-07-31 ENCOUNTER — Emergency Department
Admission: EM | Admit: 2015-07-31 | Discharge: 2015-07-31 | Disposition: A | Discharge status: Home / Self Care | Payer: Medicare Other | Attending: Emergency Medicine | Admitting: Emergency Medicine

## 2015-07-31 DIAGNOSIS — Z79899 Other long term (current) drug therapy: Secondary | ICD-10-CM | POA: Diagnosis not present

## 2015-07-31 DIAGNOSIS — N23 Unspecified renal colic: Secondary | ICD-10-CM

## 2015-07-31 DIAGNOSIS — I10 Essential (primary) hypertension: Secondary | ICD-10-CM | POA: Diagnosis not present

## 2015-07-31 DIAGNOSIS — E785 Hyperlipidemia, unspecified: Secondary | ICD-10-CM | POA: Diagnosis not present

## 2015-07-31 DIAGNOSIS — F17200 Nicotine dependence, unspecified, uncomplicated: Secondary | ICD-10-CM | POA: Insufficient documentation

## 2015-07-31 DIAGNOSIS — R109 Unspecified abdominal pain: Secondary | ICD-10-CM | POA: Diagnosis present

## 2015-07-31 DIAGNOSIS — F17290 Nicotine dependence, other tobacco product, uncomplicated: Secondary | ICD-10-CM | POA: Diagnosis not present

## 2015-07-31 DIAGNOSIS — Z791 Long term (current) use of non-steroidal anti-inflammatories (NSAID): Secondary | ICD-10-CM | POA: Insufficient documentation

## 2015-07-31 DIAGNOSIS — R110 Nausea: Secondary | ICD-10-CM | POA: Insufficient documentation

## 2015-07-31 LAB — CBC
HCT: 45 % (ref 40.0–52.0)
Hemoglobin: 15 g/dL (ref 13.0–18.0)
MCH: 32.4 pg (ref 26.0–34.0)
MCHC: 33.3 g/dL (ref 32.0–36.0)
MCV: 97.2 fL (ref 80.0–100.0)
PLATELETS: 271 10*3/uL (ref 150–440)
RBC: 4.63 MIL/uL (ref 4.40–5.90)
RDW: 13.9 % (ref 11.5–14.5)
WBC: 7.6 10*3/uL (ref 3.8–10.6)

## 2015-07-31 LAB — URINALYSIS COMPLETE WITH MICROSCOPIC (ARMC ONLY)
BILIRUBIN URINE: NEGATIVE
Bacteria, UA: NONE SEEN
GLUCOSE, UA: NEGATIVE mg/dL
Ketones, ur: NEGATIVE mg/dL
LEUKOCYTES UA: NEGATIVE
NITRITE: NEGATIVE
Protein, ur: NEGATIVE mg/dL
SPECIFIC GRAVITY, URINE: 1.017 (ref 1.005–1.030)
pH: 5 (ref 5.0–8.0)

## 2015-07-31 LAB — BASIC METABOLIC PANEL
ANION GAP: 7 (ref 5–15)
BUN: 10 mg/dL (ref 6–20)
CALCIUM: 9.4 mg/dL (ref 8.9–10.3)
CO2: 24 mmol/L (ref 22–32)
Chloride: 110 mmol/L (ref 101–111)
Creatinine, Ser: 1.07 mg/dL (ref 0.61–1.24)
Glucose, Bld: 120 mg/dL — ABNORMAL HIGH (ref 65–99)
POTASSIUM: 3.9 mmol/L (ref 3.5–5.1)
Sodium: 141 mmol/L (ref 135–145)

## 2015-07-31 MED ORDER — HYDROMORPHONE HCL 1 MG/ML IJ SOLN
1.0000 mg | Freq: Once | INTRAMUSCULAR | Status: AC
  Administered 2015-07-31: 1 mg via INTRAVENOUS
  Filled 2015-07-31: qty 1

## 2015-07-31 MED ORDER — ONDANSETRON HCL 4 MG PO TABS: 4.0000 mg | ORAL_TABLET | Freq: Every day | ORAL | Status: DC | PRN

## 2015-07-31 MED ORDER — OXYCODONE-ACETAMINOPHEN 5-325 MG PO TABS: 2.0000 | ORAL_TABLET | Freq: Four times a day (QID) | ORAL | Status: DC | PRN

## 2015-07-31 MED ORDER — KETOROLAC TROMETHAMINE 30 MG/ML IJ SOLN
30.0000 mg | Freq: Once | INTRAMUSCULAR | Status: AC
  Administered 2015-07-31: 30 mg via INTRAVENOUS
  Filled 2015-07-31: qty 1

## 2015-07-31 MED ORDER — TAMSULOSIN HCL 0.4 MG PO CAPS: 0.4000 mg | ORAL_CAPSULE | Freq: Every day | ORAL | Status: DC

## 2015-07-31 NOTE — ED Provider Notes (Signed)
[facility] Emergency Department Provider Note        Time seen: ----------------------------------------- 9:05 PM on 07/31/2015 -----------------------------------------    I have reviewed the triage vital signs and the nursing notes.   HISTORY  Chief Complaint Flank Pain    HPI Jorge Nichols is a 25 y.o. male who presents to ER with 2 days of right flank pain that radiates into his groin. Patient states been accompanied by nausea and hematuria. He has a chronic history of kidney stones and has been referred to an endocrinologist to possibly have his parathyroid gland removed. Patient denies fevers chills or other complaints.   Past Medical History  Diagnosis Date  . Hypertension   . Hyperlipidemia   . Chronic headaches   . Anxiety   . Kidney stones   . Kidney stones   . Thyroid disease     Patient Active Problem List   Diagnosis Date Noted  . Vitamin D deficiency 06/21/2015  . Other hyperparathyroidism (HCC) 06/05/2015  . BP (high blood pressure) 05/13/2015  . Calculus of kidney 05/13/2015  . Arthralgia of hip 12/04/2013  . Iliopsoas bursitis 12/04/2013  . History of repair of hip joint 07/03/2013  . Avascular necrosis of bone (HCC) 03/20/2013  . Mixed dyslipidemia 09/09/2012  . Metabolic syndrome 09/09/2012  . Morbid obesity (HCC) 09/09/2012  . Sleep disorder breathing 09/09/2012  . Family history of premature CAD 09/09/2012    Past Surgical History  Procedure Laterality Date  . Lithotripsy    . Joint replacement    . Total hip arthroplasty      Allergies Review of patient's allergies indicates no known allergies.  Social History Social History  Substance Use Topics  . Smoking status: Current Every Day Smoker -- 0.50 packs/day  . Smokeless tobacco: Current User    Types: Snuff  . Alcohol Use: 3.0 oz/week    5 Cans of beer per week     Comment: occassionally    Review of Systems Constitutional: Negative for  fever. Eyes: Negative for visual changes. ENT: Negative for sore throat. Cardiovascular: Negative for chest pain. Respiratory: Negative for shortness of breath. Gastrointestinal: Positive for right flank pain, nausea Genitourinary: Negative for dysuria. Musculoskeletal: Negative for back pain. Skin: Negative for rash. Neurological: Negative for headaches, focal weakness or numbness.  10-point ROS otherwise negative.  ____________________________________________   PHYSICAL EXAM:  VITAL SIGNS: ED Triage Vitals  Enc Vitals Group     BP 07/31/15 1911 136/58 mmHg     Pulse Rate 07/31/15 1911 79     Resp 07/31/15 1911 20     Temp 07/31/15 1911 97.6 F (36.4 C)     Temp Source 07/31/15 1911 Oral     SpO2 07/31/15 1911 98 %     Weight 07/31/15 1911 265 lb (120.203 kg)     Height 07/31/15 1911  (1.727 m)     Head Cir --      Peak Flow --      Pain Score 07/31/15 1911 8     Pain Loc --      Pain Edu? --      Excl. in GC? --     Constitutional: Alert and oriented. Well appearing and in no distress. Eyes: Conjunctivae are normal. PERRL. Normal extraocular movements. ENT   Head: Normocephalic and atraumatic.   Nose: No congestion/rhinnorhea.   Mouth/Throat: Mucous membranes are moist.   Neck: No stridor. Cardiovascular: Normal rate, regular rhythm. No murmurs, rubs, or gallops.  Respiratory: Normal respiratory effort without tachypnea nor retractions. Breath sounds are clear and equal bilaterally. No wheezes/rales/rhonchi. Gastrointestinal: Right flank tenderness, no rebound or guarding. Normal bowel sounds. Musculoskeletal: Nontender with normal range of motion in all extremities. No lower extremity tenderness nor edema. Neurologic:  Normal speech and language. No gross focal neurologic deficits are appreciated.  Skin:  Skin is warm, dry and intact. No rash noted. Psychiatric: Mood and affect are normal. Speech and behavior are normal.    ____________________________________________  ED COURSE:  Pertinent labs & imaging results that were available during my care of the patient were reviewed by me and considered in my medical decision making (see chart for details). Patient with flank pain likely renal colic. He'll receive IV pain medicine and we will check a urinalysis. ____________________________________________    LABS (pertinent positives/negatives)  Labs Reviewed  BASIC METABOLIC PANEL - Abnormal; Notable for the following:    Glucose, Bld 120 (*)    All other components within normal limits  URINALYSIS COMPLETEWITH MICROSCOPIC ([facility] ONLY) - Abnormal; Notable for the following:    Color, Urine YELLOW (*)    APPearance CLEAR (*)    Hgb urine dipstick 3+ (*)    Squamous Epithelial / LPF 0-5 (*)    All other components within normal limits  CBC   ____________________________________________  FINAL ASSESSMENT AND PLAN  Flank pain  Plan: Patient with labs as dictated above. Patient with a long history of renal colic, he'll be discharged with Flomax, Percocet and Zofran to take as needed. He is referred to his urology for outpatient follow-up.   Emily FilbertWilliams, Dyron Kawano E, MD   Note: This dictation was prepared with Dragon dictation. Any transcriptional errors that result from this process are unintentional   Emily FilbertJonathan E Ivie Savitt, MD 07/31/15 2144

## 2015-07-31 NOTE — ED Notes (Signed)
Pt states he has a history of kidney stones and his flank pain today feels the same. Pt states "I have had 97 kidney stones and I am seeing a urologist." Pt 10/10 pain.

## 2015-07-31 NOTE — Discharge Instructions (Signed)
Kidney Stones °Kidney stones (urolithiasis) are deposits that form inside your kidneys. The intense pain is caused by the stone moving through the urinary tract. When the stone moves, the ureter goes into spasm around the stone. The stone is usually passed in the urine.  °CAUSES  °· A disorder that makes certain neck glands produce too much parathyroid hormone (primary hyperparathyroidism). °· A buildup of uric acid crystals, similar to gout in your joints. °· Narrowing (stricture) of the ureter. °· A kidney obstruction present at birth (congenital obstruction). °· Previous surgery on the kidney or ureters. °· Numerous kidney infections. °SYMPTOMS  °· Feeling sick to your stomach (nauseous). °· Throwing up (vomiting). °· Blood in the urine (hematuria). °· Pain that usually spreads (radiates) to the groin. °· Frequency or urgency of urination. °DIAGNOSIS  °· Taking a history and physical exam. °· Blood or urine tests. °· CT scan. °· Occasionally, an examination of the inside of the urinary bladder (cystoscopy) is performed. °TREATMENT  °· Observation. °· Increasing your fluid intake. °· Extracorporeal shock wave lithotripsy--This is a noninvasive procedure that uses shock waves to break up kidney stones. °· Surgery may be needed if you have severe pain or persistent obstruction. There are various surgical procedures. Most of the procedures are performed with the use of small instruments. Only small incisions are needed to accommodate these instruments, so recovery time is minimized. °The size, location, and chemical composition are all important variables that will determine the proper choice of action for you. Talk to your health care provider to better understand your situation so that you will minimize the risk of injury to yourself and your kidney.  °HOME CARE INSTRUCTIONS  °· Drink enough water and fluids to keep your urine clear or pale yellow. This will help you to pass the stone or stone fragments. °· Strain  all urine through the provided strainer. Keep all particulate matter and stones for your health care provider to see. The stone causing the pain may be as small as a grain of salt. It is very important to use the strainer each and every time you pass your urine. The collection of your stone will allow your health care provider to analyze it and verify that a stone has actually passed. The stone analysis will often identify what you can do to reduce the incidence of recurrences. °· Only take over-the-counter or prescription medicines for pain, discomfort, or fever as directed by your health care provider. °· Keep all follow-up visits as told by your health care provider. This is important. °· Get follow-up X-rays if required. The absence of pain does not always mean that the stone has passed. It may have only stopped moving. If the urine remains completely obstructed, it can cause loss of kidney function or even complete destruction of the kidney. It is your responsibility to make sure X-rays and follow-ups are completed. Ultrasounds of the kidney can show blockages and the status of the kidney. Ultrasounds are not associated with any radiation and can be performed easily in a matter of minutes. °· Make changes to your daily diet as told by your health care provider. You may be told to: °¨ Limit the amount of salt that you eat. °¨ Eat 5 or more servings of fruits and vegetables each day. °¨ Limit the amount of meat, poultry, fish, and eggs that you eat. °· Collect a 24-hour urine sample as told by your health care provider. You may need to collect another urine sample every 6-12   months. °SEEK MEDICAL CARE IF: °· You experience pain that is progressive and unresponsive to any pain medicine you have been prescribed. °SEEK IMMEDIATE MEDICAL CARE IF:  °· Pain cannot be controlled with the prescribed medicine. °· You have a fever or shaking chills. °· The severity or intensity of pain increases over 18 hours and is not  relieved by pain medicine. °· You develop a new onset of abdominal pain. °· You feel faint or pass out. °· You are unable to urinate. °  °This information is not intended to replace advice given to you by your health care provider. Make sure you discuss any questions you have with your health care provider. °  °Document Released: 03/09/2005 Document Revised: 11/28/2014 Document Reviewed: 08/10/2012 °[employer] Interactive Patient Education ©2016 [employer] ° °

## 2015-07-31 NOTE — ED Notes (Signed)
Patient ambulatory to triage with steady gait, without difficulty or distress noted; pt reports x 2 days having right flank pain radiating around into groin accomp by nausea and hematuria; st hx kidney stones;

## 2015-08-07 ENCOUNTER — Ambulatory Visit: Payer: BLUE CROSS/BLUE SHIELD | Admitting: Urology

## 2015-08-09 ENCOUNTER — Encounter (HOSPITAL_COMMUNITY): Payer: Self-pay | Admitting: Emergency Medicine

## 2015-08-09 ENCOUNTER — Emergency Department (HOSPITAL_COMMUNITY)
Admission: EM | Admit: 2015-08-09 | Discharge: 2015-08-10 | Disposition: A | Discharge status: Home / Self Care | Payer: BLUE CROSS/BLUE SHIELD | Attending: Emergency Medicine | Admitting: Emergency Medicine

## 2015-08-09 DIAGNOSIS — R319 Hematuria, unspecified: Secondary | ICD-10-CM

## 2015-08-09 DIAGNOSIS — I10 Essential (primary) hypertension: Secondary | ICD-10-CM | POA: Insufficient documentation

## 2015-08-09 DIAGNOSIS — R109 Unspecified abdominal pain: Secondary | ICD-10-CM

## 2015-08-09 DIAGNOSIS — F17290 Nicotine dependence, other tobacco product, uncomplicated: Secondary | ICD-10-CM | POA: Insufficient documentation

## 2015-08-09 DIAGNOSIS — E785 Hyperlipidemia, unspecified: Secondary | ICD-10-CM | POA: Insufficient documentation

## 2015-08-09 HISTORY — DX: Pain in unspecified hip: M25.559

## 2015-08-09 HISTORY — DX: Other chronic pain: G89.29

## 2015-08-09 MED ORDER — FENTANYL CITRATE (PF) 100 MCG/2ML IJ SOLN
100.0000 ug | Freq: Once | INTRAMUSCULAR | Status: AC
  Administered 2015-08-09: 100 ug via INTRAMUSCULAR
  Filled 2015-08-09: qty 2

## 2015-08-09 MED ORDER — KETOROLAC TROMETHAMINE 60 MG/2ML IM SOLN
60.0000 mg | Freq: Once | INTRAMUSCULAR | Status: AC
  Administered 2015-08-09: 60 mg via INTRAMUSCULAR
  Filled 2015-08-09: qty 2

## 2015-08-09 NOTE — ED Notes (Signed)
Pt states that he started having right flank pain on and off 2 weeks ago.  Was seen and told he had a kidney stone.  Pain worse tonight with painful urination

## 2015-08-10 ENCOUNTER — Emergency Department (HOSPITAL_COMMUNITY): Payer: BLUE CROSS/BLUE SHIELD

## 2015-08-10 LAB — URINALYSIS, ROUTINE W REFLEX MICROSCOPIC
BILIRUBIN URINE: NEGATIVE
Glucose, UA: NEGATIVE mg/dL
KETONES UR: NEGATIVE mg/dL
Leukocytes, UA: NEGATIVE
NITRITE: NEGATIVE
PROTEIN: NEGATIVE mg/dL
pH: 5.5 (ref 5.0–8.0)

## 2015-08-10 LAB — URINE MICROSCOPIC-ADD ON

## 2015-08-10 MED ORDER — FENTANYL CITRATE (PF) 100 MCG/2ML IJ SOLN
100.0000 ug | Freq: Once | INTRAMUSCULAR | Status: AC
  Administered 2015-08-10: 100 ug via INTRAMUSCULAR
  Filled 2015-08-10: qty 2

## 2015-08-10 MED ORDER — METHOCARBAMOL 500 MG PO TABS: 1000.0000 mg | ORAL_TABLET | Freq: Four times a day (QID) | ORAL | Status: DC | PRN

## 2015-08-10 MED ORDER — NAPROXEN 250 MG PO TABS: 250.0000 mg | ORAL_TABLET | Freq: Two times a day (BID) | ORAL | Status: DC

## 2015-08-10 NOTE — ED Provider Notes (Signed)
CSN: [id number]     Arrival date & time 08/09/15  2300 History   First MD Initiated Contact with Patient 08/09/15 2345     Chief Complaint  Patient presents with  . Flank Pain      HPI Pt was seen at 2345. Per pt, c/o sudden onset and persistence of waxing and waning right sided flank "pain" that began 2 weeks ago.  Pt describes the pain as "like my last kidney stone," and radiating into the right side of his abd.  Has been associated with no other symptoms.  Denies testicular pain/swelling, no dysuria/hematuria, no abd pain, no diarrhea, no black or blood in emesis, no CP/SOB, no fevers, no rash. The symptoms have been associated with no other complaints. The patient has a significant history of similar symptoms previously, recently being evaluated for this complaint and multiple prior evals for same, with last ED evaluation 10 days ago.       Past Medical History  Diagnosis Date  . Hypertension   . Hyperlipidemia   . Chronic headaches   . Anxiety   . Kidney stones   . Kidney stones   . Thyroid disease   . Chronic hip pain    Past Surgical History  Procedure Laterality Date  . Lithotripsy    . Joint replacement    . Total hip arthroplasty     Family History  Problem Relation Age of Onset  . Hypertension Father    Social History  Substance Use Topics  . Smoking status: Current Every Day Smoker -- 0.50 packs/day  . Smokeless tobacco: Current User    Types: Snuff  . Alcohol Use: 3.0 oz/week    5 Cans of beer per week     Comment: occassionally    Review of Systems ROS: Statement: All systems negative except as marked or noted in the HPI; Constitutional: Negative for fever and chills. ; ; Eyes: Negative for eye pain, redness and discharge. ; ; ENMT: Negative for ear pain, hoarseness, nasal congestion, sinus pressure and sore throat. ; ; Cardiovascular: Negative for chest pain, palpitations, diaphoresis, dyspnea and peripheral edema. ; ; Respiratory: Negative for cough,  wheezing and stridor. ; ; Gastrointestinal: Negative for nausea, vomiting, diarrhea, abdominal pain, blood in stool, hematemesis, jaundice and rectal bleeding. . ; ; Genitourinary: +flank pain. Negative for dysuria and hematuria. ; ; Genital:  No penile drainage or rash, no testicular pain or swelling, no scrotal rash or swelling. ;; Musculoskeletal: Negative for back pain and neck pain. Negative for swelling and trauma.; ; Skin: Negative for pruritus, rash, abrasions, blisters, bruising and skin lesion.; ; Neuro: Negative for headache, lightheadedness and neck stiffness. Negative for weakness, altered level of consciousness, altered mental status, extremity weakness, paresthesias, involuntary movement, seizure and syncope.       Allergies  Review of patient's allergies indicates no known allergies.  Home Medications   Prior to Admission medications   Medication Sig Start Date End Date Taking? Authorizing Provider  ALPRAZolam Prudy Feeler(XANAX) 0.5 MG tablet Take 0.5 mg by mouth at bedtime as needed for anxiety.     Historical Provider, MD  ibuprofen (ADVIL,MOTRIN) 800 MG tablet Take 1 tablet (800 mg total) by mouth 3 (three) times daily. Patient taking differently: Take 800 mg by mouth 3 (three) times daily as needed for mild pain.  03/17/15   Eber HongBrian Miller, MD  metoprolol (LOPRESSOR) 50 MG tablet Take 50 mg by mouth 2 (two) times daily.    Historical Provider, MD  naproxen (  NAPROSYN) 500 MG tablet Take 1 tablet (500 mg total) by mouth 2 (two) times daily. Patient taking differently: Take 500 mg by mouth 2 (two) times daily as needed for mild pain.  04/07/15   Devoria Albe, MD  ondansetron (ZOFRAN) 4 MG tablet Take 1 tablet (4 mg total) by mouth daily as needed for nausea or vomiting. 07/31/15   Emily Filbert, MD  oxyCODONE-acetaminophen (PERCOCET) 5-325 MG tablet Take 1 tablet by mouth every 4 (four) hours as needed for moderate pain. 05/19/15   Dione Booze, MD  oxyCODONE-acetaminophen (PERCOCET) 5-325 MG  tablet Take 2 tablets by mouth every 6 (six) hours as needed for moderate pain or severe pain. 07/31/15   Emily Filbert, MD  oxyCODONE-acetaminophen (ROXICET) 5-325 MG tablet Take 1 tablet by mouth every 6 (six) hours as needed. 07/14/15   Rebecka Apley, MD  potassium citrate (UROCIT-K) 10 MEQ (1080 MG) SR tablet Take 10 mEq by mouth 3 (three) times daily. 07/02/15   Historical Provider, MD  tamsulosin (FLOMAX) 0.4 MG CAPS capsule Take 1 po QD until you pass the stone. Patient taking differently: Take 0.4 mg by mouth daily after breakfast. Take 1 po QD until you pass the stone. 04/07/15   Devoria Albe, MD  tamsulosin (FLOMAX) 0.4 MG CAPS capsule Take 1 capsule (0.4 mg total) by mouth daily after breakfast. 07/31/15   Emily Filbert, MD  Vitamin D, Ergocalciferol, (DRISDOL) 50000 units CAPS capsule Take 1 capsule (50,000 Units total) by mouth every 7 (seven) days. 06/21/15   Roma Kayser, MD   BP 142/79 mmHg  Pulse 86  Temp(Src) 97.7 F (36.5 C) (Oral)  Resp 24  Ht 5\' 8"  (1.727 m)  Wt 260 lb (117.935 kg)  BMI 39.54 kg/m2  SpO2 100% Physical Exam  2350: Physical examination:  Nursing notes reviewed; Vital signs and O2 SAT reviewed;  Constitutional: Well developed, Well nourished, Well hydrated, In no acute distress; Head:  Normocephalic, atraumatic; Eyes: EOMI, PERRL, No scleral icterus; ENMT: Mouth and pharynx normal, Mucous membranes moist; Neck: Supple, Full range of motion, No lymphadenopathy; Cardiovascular: Regular rate and rhythm, No murmur, rub, or gallop; Respiratory: Breath sounds clear & equal bilaterally, No rales, rhonchi, wheezes.  Speaking full sentences with ease, Normal respiratory effort/excursion; Chest: Nontender, Movement normal; Abdomen: Soft, Nontender, Nondistended, Normal bowel sounds; Genitourinary: No CVA tenderness; Extremities: Pulses normal, No tenderness, No edema, No calf edema or asymmetry.; Neuro: AA&Ox3, Major CN grossly intact.  Speech clear. No  gross focal motor or sensory deficits in extremities.; Skin: Color normal, Warm, Dry.   ED Course  Procedures (including critical care time) Labs Review  Imaging Review  I have personally reviewed and evaluated these images and lab results as part of my medical decision-making.   EKG Interpretation None      MDM  MDM Reviewed: previous chart, nursing note and vitals Reviewed previous: labs, CT scan, ultrasound and x-ray Interpretation: labs and CT scan     Results for orders placed or performed during the hospital encounter of 08/09/15  Urinalysis, Routine w reflex microscopic  Result Value Ref Range   Color, Urine YELLOW YELLOW   APPearance CLEAR CLEAR   Specific Gravity, Urine >1.030 (H) 1.005 - 1.030   pH 5.5 5.0 - 8.0   Glucose, UA NEGATIVE NEGATIVE mg/dL   Hgb urine dipstick LARGE (A) NEGATIVE   Bilirubin Urine NEGATIVE NEGATIVE   Ketones, ur NEGATIVE NEGATIVE mg/dL   Protein, ur NEGATIVE NEGATIVE mg/dL   Nitrite  NEGATIVE NEGATIVE   Leukocytes, UA NEGATIVE NEGATIVE  Urine microscopic-add on  Result Value Ref Range   Squamous Epithelial / LPF 0-5 (A) NONE SEEN   WBC, UA 0-5 0 - 5 WBC/hpf   RBC / HPF TOO NUMEROUS TO COUNT 0 - 5 RBC/hpf   Bacteria, UA RARE (A) NONE SEEN   Ct Renal Stone Study 08/10/2015  CLINICAL DATA:  25 year old male with right flank pain. Painful urination. EXAM: CT ABDOMEN AND PELVIS WITHOUT CONTRAST TECHNIQUE: Multidetector CT imaging of the abdomen and pelvis was performed following the standard protocol without IV contrast. COMPARISON:  CT dated 05/02/2015 FINDINGS: Evaluation of this exam is limited in the absence of intravenous contrast. Evaluation of the pelvic area is also very limited due to streak artifact caused by bilateral metallic hip arthroplasty. The visualized lung bases are clear. No intra-abdominal free air or free fluid. There is mild diffuse fatty infiltration of the liver. The liver is otherwise unremarkable. The gallbladder,  pancreas, spleen, and adrenal glands appear unremarkable. There are small nonobstructing bilateral renal calculi. The largest stone measures approximately 5 mm in the inferior pole of the right kidney is similar to prior study. There is no hydronephrosis on either side. The visualized ureters appear unremarkable. The urinary bladder is collapsed. The prostate and seminal vesicles are not well visualized due to streak artifact. There is no evidence of bowel obstruction or active inflammation. Normal appendix. The abdominal aorta and IVC are grossly unremarkable on this noncontrast study. No portal venous gas identified. There is no adenopathy. There is a small fat containing umbilical hernia. The abdominal wall soft tissues are otherwise unremarkable. Bilateral hip arthroplasties noted. No acute fracture. IMPRESSION: Small nonobstructing bilateral renal calculi.  No hydronephrosis. Electronically Signed   By: Elgie Collard M.D.   On: 08/10/2015 03:08    0315:  No ureteral stones. Tx symptomatically; f/u Uro MD. [place] Controlled Substance Database accessed: in the past 4 weeks, pt has received 4 different narcotic rx, written by 4 different providers. Dx and testing d/w pt and family.  Questions answered.  Verb understanding, agreeable to d/c home with outpt f/u.   Samuel Jester, DO 08/13/15 [id number]

## 2015-08-10 NOTE — Discharge Instructions (Signed)
Take the prescriptions as directed.  Apply moist heat or ice to the area(s) of discomfort, for 15 minutes at a time, several times per day for the next few days.  Do not fall asleep on a heating or ice pack.  Call the Urologist on Monday to schedule a follow up appointment this week.  Return to the Emergency Department immediately if worsening.

## 2015-08-13 ENCOUNTER — Ambulatory Visit (INDEPENDENT_AMBULATORY_CARE_PROVIDER_SITE_OTHER): Payer: Medicare Other | Admitting: Urology

## 2015-08-13 ENCOUNTER — Other Ambulatory Visit: Payer: Self-pay | Admitting: "Endocrinology

## 2015-08-13 DIAGNOSIS — N200 Calculus of kidney: Secondary | ICD-10-CM | POA: Diagnosis not present

## 2015-08-14 LAB — VITAMIN D 25 HYDROXY (VIT D DEFICIENCY, FRACTURES): Vit D, 25-Hydroxy: 25 ng/mL — ABNORMAL LOW (ref 30–100)

## 2015-08-14 LAB — PTH, INTACT AND CALCIUM
Calcium: 9.5 mg/dL (ref 8.4–10.5)
PTH: 42 pg/mL (ref 14–64)

## 2015-08-19 DIAGNOSIS — K0381 Cracked tooth: Secondary | ICD-10-CM | POA: Diagnosis not present

## 2015-08-20 ENCOUNTER — Emergency Department (HOSPITAL_COMMUNITY)
Admission: EM | Admit: 2015-08-20 | Discharge: 2015-08-20 | Disposition: A | Discharge status: Home / Self Care | Payer: BLUE CROSS/BLUE SHIELD | Attending: Emergency Medicine | Admitting: Emergency Medicine

## 2015-08-20 ENCOUNTER — Encounter (HOSPITAL_COMMUNITY): Payer: Self-pay | Admitting: Emergency Medicine

## 2015-08-20 DIAGNOSIS — R109 Unspecified abdominal pain: Secondary | ICD-10-CM | POA: Diagnosis not present

## 2015-08-20 DIAGNOSIS — I10 Essential (primary) hypertension: Secondary | ICD-10-CM | POA: Insufficient documentation

## 2015-08-20 DIAGNOSIS — R112 Nausea with vomiting, unspecified: Secondary | ICD-10-CM | POA: Diagnosis not present

## 2015-08-20 DIAGNOSIS — F17210 Nicotine dependence, cigarettes, uncomplicated: Secondary | ICD-10-CM | POA: Diagnosis not present

## 2015-08-20 DIAGNOSIS — R319 Hematuria, unspecified: Secondary | ICD-10-CM | POA: Insufficient documentation

## 2015-08-20 DIAGNOSIS — Z79899 Other long term (current) drug therapy: Secondary | ICD-10-CM | POA: Diagnosis not present

## 2015-08-20 DIAGNOSIS — E785 Hyperlipidemia, unspecified: Secondary | ICD-10-CM | POA: Diagnosis not present

## 2015-08-20 DIAGNOSIS — Z79891 Long term (current) use of opiate analgesic: Secondary | ICD-10-CM | POA: Insufficient documentation

## 2015-08-20 DIAGNOSIS — Z791 Long term (current) use of non-steroidal anti-inflammatories (NSAID): Secondary | ICD-10-CM | POA: Diagnosis not present

## 2015-08-20 LAB — COMPREHENSIVE METABOLIC PANEL
ALBUMIN: 4.2 g/dL (ref 3.5–5.0)
ALT: 40 U/L (ref 17–63)
AST: 30 U/L (ref 15–41)
Alkaline Phosphatase: 53 U/L (ref 38–126)
Anion gap: 7 (ref 5–15)
BUN: 18 mg/dL (ref 6–20)
CHLORIDE: 103 mmol/L (ref 101–111)
CO2: 27 mmol/L (ref 22–32)
CREATININE: 1.1 mg/dL (ref 0.61–1.24)
Calcium: 9.1 mg/dL (ref 8.9–10.3)
GFR calc non Af Amer: >60 mL/min (ref >60)
GLUCOSE: 111 mg/dL — AB (ref 65–99)
Potassium: 3.9 mmol/L (ref 3.5–5.1)
SODIUM: 137 mmol/L (ref 135–145)
Total Bilirubin: 0.5 mg/dL (ref 0.3–1.2)
Total Protein: 7.1 g/dL (ref 6.5–8.1)

## 2015-08-20 LAB — CBC
HCT: 44 % (ref 39.0–52.0)
Hemoglobin: 14.8 g/dL (ref 13.0–17.0)
MCH: 32.5 pg (ref 26.0–34.0)
MCHC: 33.6 g/dL (ref 30.0–36.0)
MCV: 96.5 fL (ref 78.0–100.0)
PLATELETS: 305 10*3/uL (ref 150–400)
RBC: 4.56 MIL/uL (ref 4.22–5.81)
RDW: 12.6 % (ref 11.5–15.5)
WBC: 9.1 10*3/uL (ref 4.0–10.5)

## 2015-08-20 LAB — URINALYSIS, ROUTINE W REFLEX MICROSCOPIC
Bilirubin Urine: NEGATIVE
GLUCOSE, UA: NEGATIVE mg/dL
Ketones, ur: NEGATIVE mg/dL
Leukocytes, UA: NEGATIVE
Nitrite: NEGATIVE
PROTEIN: NEGATIVE mg/dL
Specific Gravity, Urine: 1.02 (ref 1.005–1.030)
pH: 5.5 (ref 5.0–8.0)

## 2015-08-20 LAB — URINE MICROSCOPIC-ADD ON
BACTERIA UA: NONE SEEN
Squamous Epithelial / LPF: NONE SEEN
WBC, UA: NONE SEEN WBC/hpf (ref 0–5)

## 2015-08-20 LAB — LIPASE, BLOOD: LIPASE: 28 U/L (ref 11–51)

## 2015-08-20 MED ORDER — ONDANSETRON HCL 4 MG/2ML IJ SOLN
4.0000 mg | Freq: Once | INTRAMUSCULAR | Status: AC
  Administered 2015-08-20: 4 mg via INTRAVENOUS
  Filled 2015-08-20: qty 2

## 2015-08-20 MED ORDER — NAPROXEN 500 MG PO TABS: 500.0000 mg | ORAL_TABLET | Freq: Two times a day (BID) | ORAL | Status: DC | PRN

## 2015-08-20 MED ORDER — KETOROLAC TROMETHAMINE 30 MG/ML IJ SOLN
30.0000 mg | Freq: Once | INTRAMUSCULAR | Status: AC
  Administered 2015-08-20: 30 mg via INTRAVENOUS
  Filled 2015-08-20: qty 1

## 2015-08-20 MED ORDER — HYDROMORPHONE HCL 1 MG/ML IJ SOLN
0.5000 mg | Freq: Once | INTRAMUSCULAR | Status: AC
  Administered 2015-08-20: 0.5 mg via INTRAVENOUS
  Filled 2015-08-20: qty 1

## 2015-08-20 MED ORDER — SODIUM CHLORIDE 0.9 % IV BOLUS (SEPSIS)
500.0000 mL | Freq: Once | INTRAVENOUS | Status: AC
  Administered 2015-08-20: 500 mL via INTRAVENOUS

## 2015-08-20 MED ORDER — HYDROCODONE-ACETAMINOPHEN 5-325 MG PO TABS: 1.0000 | ORAL_TABLET | ORAL | Status: DC | PRN

## 2015-08-20 NOTE — ED Provider Notes (Signed)
CSN: [id number]     Arrival date & time 08/20/15  0931 History  By signing my name below, I, Majel Homer & Freida Busman, attest that this documentation has been prepared under the direction and in the presence of Blane Ohara, MD . Electronically Signed: Majel Homer; Freida Busman, Scribes. 08/20/2015. 11:16 AM.   Chief Complaint  Patient presents with  . Flank Pain   The history is provided by the patient. No language interpreter was used.   HPI Comments:  Jorge Nichols is a 25 y.o. male who presents to the Emergency Department complaining of 9/10 left flank pain that radiates to his left groin that began at 4:30 this AM. Pt states associated symptoms of nausea and vomiting that also began this morning. He believes he may have passed a piece of a stone this AM when he urinated. He has a h/o kidney stones; states that he usually passes approximately 1-2 kidney stones a month.  He also reports h/o Lithotripsy. He denies fever and chills.   NKDA  Past Medical History  Diagnosis Date  . Hypertension   . Hyperlipidemia   . Chronic headaches   . Anxiety   . Kidney stones   . Kidney stones   . Thyroid disease   . Chronic hip pain    Past Surgical History  Procedure Laterality Date  . Lithotripsy    . Joint replacement    . Total hip arthroplasty     Family History  Problem Relation Age of Onset  . Hypertension Father    Social History  Substance Use Topics  . Smoking status: Current Every Day Smoker -- 0.50 packs/day  . Smokeless tobacco: Current User    Types: Snuff  . Alcohol Use: 3.0 oz/week    5 Cans of beer per week     Comment: occassionally    Review of Systems  Constitutional: Negative for fever and chills.  Gastrointestinal: Positive for nausea and vomiting.  Genitourinary: Positive for flank pain.  All other systems reviewed and are negative.   Allergies  Review of patient's allergies indicates no known allergies.  Home Medications   Prior to Admission  medications   Medication Sig Start Date End Date Taking? Authorizing Provider  ALPRAZolam Prudy Feeler) 0.5 MG tablet Take 0.5 mg by mouth at bedtime as needed for anxiety.    Yes Historical Provider, MD  HYDROcodone-acetaminophen (NORCO/VICODIN) 5-325 MG tablet Take 1 tablet by mouth every 4 (four) hours as needed for moderate pain.   Yes Historical Provider, MD  ibuprofen (ADVIL,MOTRIN) 800 MG tablet Take 1 tablet (800 mg total) by mouth 3 (three) times daily. Patient taking differently: Take 800 mg by mouth 3 (three) times daily as needed for mild pain.  03/17/15  Yes Eber Hong, MD  methocarbamol (ROBAXIN) 500 MG tablet Take 2 tablets (1,000 mg total) by mouth 4 (four) times daily as needed for muscle spasms (muscle spasm/pain). 08/10/15  Yes Samuel Jester, DO  metoprolol (LOPRESSOR) 50 MG tablet Take 50 mg by mouth 2 (two) times daily.   Yes Historical Provider, MD  naproxen (NAPROSYN) 250 MG tablet Take 1 tablet (250 mg total) by mouth 2 (two) times daily with a meal. 08/10/15  Yes Samuel Jester, DO  potassium citrate (UROCIT-K) 10 MEQ (1080 MG) SR tablet Take 10 mEq by mouth 3 (three) times daily. 07/02/15  Yes Historical Provider, MD  tamsulosin (FLOMAX) 0.4 MG CAPS capsule Take 1 capsule (0.4 mg total) by mouth daily after breakfast. 07/31/15  Yes  Emily Filbert, MD  traMADol (ULTRAM) 50 MG tablet Take 50 mg by mouth every 6 (six) hours as needed.  07/09/15  Yes Historical Provider, MD  Vitamin D, Ergocalciferol, (DRISDOL) 50000 units CAPS capsule Take 1 capsule (50,000 Units total) by mouth every 7 (seven) days. 06/21/15  Yes Roma Kayser, MD  HYDROcodone-acetaminophen (NORCO) 5-325 MG tablet Take 1-2 tablets by mouth every 4 (four) hours as needed. 08/20/15   Blane Ohara, MD  naproxen (NAPROSYN) 500 MG tablet Take 1 tablet (500 mg total) by mouth 2 (two) times daily as needed. 08/20/15   Blane Ohara, MD  ondansetron (ZOFRAN) 4 MG tablet Take 1 tablet (4 mg total) by mouth daily  as needed for nausea or vomiting. Patient not taking: Reported on 08/20/2015 07/31/15   Emily Filbert, MD  oxyCODONE-acetaminophen (PERCOCET) 5-325 MG tablet Take 1 tablet by mouth every 4 (four) hours as needed for moderate pain. Patient not taking: Reported on 08/20/2015 05/19/15   Dione Booze, MD  oxyCODONE-acetaminophen (PERCOCET) 5-325 MG tablet Take 2 tablets by mouth every 6 (six) hours as needed for moderate pain or severe pain. Patient not taking: Reported on 08/20/2015 07/31/15   Emily Filbert, MD  oxyCODONE-acetaminophen (ROXICET) 5-325 MG tablet Take 1 tablet by mouth every 6 (six) hours as needed. Patient not taking: Reported on 08/20/2015 07/14/15   Rebecka Apley, MD  tamsulosin (FLOMAX) 0.4 MG CAPS capsule Take 1 po QD until you pass the stone. Patient taking differently: Take 0.4 mg by mouth daily after breakfast. Take 1 po QD until you pass the stone. 04/07/15   Devoria Albe, MD   BP 124/80 mmHg  Pulse 82  Temp(Src) 97.7 F (36.5 C) (Oral)  Resp 20  Ht 5\' 8"  (1.727 m)  Wt 270 lb (122.471 kg)  BMI 41.06 kg/m2  SpO2 100% Physical Exam  Constitutional: He is oriented to person, place, and time. He appears well-developed and well-nourished.  HENT:  Head: Normocephalic and atraumatic.  Eyes: Conjunctivae are normal. Right eye exhibits no discharge. Left eye exhibits no discharge.  Neck: Normal range of motion. Neck supple. No tracheal deviation present.  Cardiovascular: Normal rate and regular rhythm.   Pulmonary/Chest: Effort normal and breath sounds normal.  Abdominal: Soft. He exhibits no distension. There is no tenderness. There is no guarding.  No focal tenderness to abdomen  No guarding  Pt uncomfortable in the room  Musculoskeletal: He exhibits no edema.  Neurological: He is alert and oriented to person, place, and time.  Skin: Skin is warm. No rash noted.  Psychiatric: He has a normal mood and affect.  Nursing note and vitals reviewed.   ED Course   Procedures   Emergency Focused Ultrasound Exam Limited retroperitoneal ultrasound of kidneys  Performed and interpreted by Dr. Jodi Mourning Indication: flank pain Focused abdominal ultrasound with both kidneys imaged in transverse and longitudinal planes in real-time. Interpretation: no  hydronephrosis visualized.   Images archived electronically  CPT Code: [phone] (limited retroperitoneal)  DIAGNOSTIC STUDIES:  Oxygen Saturation is 100% on RA, normal by my interpretation.    COORDINATION OF CARE:  10:24 AM Discussed treatment plan with pt which includes pain medication at bedside and pt agreed to plan. Pt has been advised to follow up with his urologist.   Labs Review Labs Reviewed  COMPREHENSIVE METABOLIC PANEL - Abnormal; Notable for the following:    Glucose, Bld 111 (*)    All other components within normal limits  URINALYSIS, ROUTINE W REFLEX MICROSCOPIC (  NOT [facility]) - Abnormal; Notable for the following:    APPearance HAZY (*)    Hgb urine dipstick LARGE (*)    All other components within normal limits  LIPASE, BLOOD  CBC  URINE MICROSCOPIC-ADD ON    Imaging Review No results found. I have personally reviewed and evaluated these images and lab results as part of my medical decision-making.   MDM   Final diagnoses:  Left flank pain  Hematuria   Patient presents with clinical concern for kidney stone. Hematuria, recurrent stones. Well-appearing otherwise no fever. Urinalysis consistent with kidney stone. Bedside ultrasound no significant hydronephrosis. Patient does not need emergent CT scan will follow-up urology. No signs of infection.  Results and differential diagnosis were discussed with the patient/parent/guardian. Xrays were independently reviewed by myself.  Close follow up outpatient was discussed, comfortable with the plan.   Medications  ondansetron (ZOFRAN) injection 4 mg (4 mg Intravenous Given 08/20/15 1003)  ketorolac (TORADOL) 30 MG/ML  injection 30 mg (30 mg Intravenous Given 08/20/15 1043)  HYDROmorphone (DILAUDID) injection 0.5 mg (0.5 mg Intravenous Given 08/20/15 1043)  sodium chloride 0.9 % bolus 500 mL (0 mLs Intravenous Stopped 08/20/15 1122)    Filed Vitals:   08/20/15 0934  BP: 124/80  Pulse: 82  Temp: 97.7 F (36.5 C)  TempSrc: Oral  Resp: 20  Height: 5\' 8"  (1.727 m)  Weight: 270 lb (122.471 kg)  SpO2: 100%    Final diagnoses:  Left flank pain  Hematuria      Blane OharaJoshua Cathlin Buchan, MD 08/20/15 1122

## 2015-08-20 NOTE — Discharge Instructions (Signed)
If you were given medicines take as directed.  If you are on coumadin or contraceptives realize their levels and effectiveness is altered by many different medicines.  If you have any reaction (rash, tongues swelling, other) to the medicines stop taking and see a physician.    If your blood pressure was elevated in the ER make sure you follow up for management with a primary doctor or return for chest pain, shortness of breath or stroke symptoms.  Please follow up as directed and return to the ER or see a physician for new or worsening symptoms.  Thank you. Filed Vitals:   08/20/15 0934  BP: 124/80  Pulse: 82  Temp: 97.7 F (36.5 C)  TempSrc: Oral  Resp: 20  Height: 5\' 8"  (1.727 m)  Weight: 270 lb (122.471 kg)  SpO2: 100%

## 2015-08-20 NOTE — ED Notes (Signed)
Pt c/o LT sided flank pain with n/v that began at 0430 this morning. Pt also reports difficulty urinating. Pt hx of kidney stones.

## 2015-08-27 ENCOUNTER — Emergency Department
Admission: EM | Admit: 2015-08-27 | Discharge: 2015-08-27 | Disposition: A | Discharge status: Home / Self Care | Payer: Medicare Other | Attending: Emergency Medicine | Admitting: Emergency Medicine

## 2015-08-27 ENCOUNTER — Encounter: Payer: Self-pay | Admitting: Emergency Medicine

## 2015-08-27 DIAGNOSIS — K029 Dental caries, unspecified: Secondary | ICD-10-CM | POA: Diagnosis not present

## 2015-08-27 DIAGNOSIS — Z791 Long term (current) use of non-steroidal anti-inflammatories (NSAID): Secondary | ICD-10-CM | POA: Diagnosis not present

## 2015-08-27 DIAGNOSIS — F17290 Nicotine dependence, other tobacco product, uncomplicated: Secondary | ICD-10-CM | POA: Diagnosis not present

## 2015-08-27 DIAGNOSIS — K0889 Other specified disorders of teeth and supporting structures: Secondary | ICD-10-CM | POA: Diagnosis not present

## 2015-08-27 DIAGNOSIS — Z79899 Other long term (current) drug therapy: Secondary | ICD-10-CM | POA: Insufficient documentation

## 2015-08-27 DIAGNOSIS — E785 Hyperlipidemia, unspecified: Secondary | ICD-10-CM | POA: Insufficient documentation

## 2015-08-27 DIAGNOSIS — I10 Essential (primary) hypertension: Secondary | ICD-10-CM | POA: Diagnosis not present

## 2015-08-27 MED ORDER — AMOXICILLIN 875 MG PO TABS: 875.0000 mg | ORAL_TABLET | Freq: Two times a day (BID) | ORAL | Status: DC

## 2015-08-27 MED ORDER — OXYCODONE-ACETAMINOPHEN 5-325 MG PO TABS: 1.0000 | ORAL_TABLET | ORAL | Status: DC | PRN

## 2015-08-27 MED ORDER — LIDOCAINE VISCOUS 2 % MT SOLN: 20.0000 mL | OROMUCOSAL | Status: DC | PRN

## 2015-08-27 NOTE — ED Notes (Signed)
Pt to ed with c/o right sided facial swelling and pain x 1 day.  Pt states started as toothache yesterday.

## 2015-08-27 NOTE — ED Provider Notes (Signed)
[facility] Emergency Department Provider Note  ____________________________________________  Time seen: Approximately 3:12 PM  I have reviewed the triage vital signs and the nursing notes.   HISTORY  Chief Complaint Dental Pain    HPI Jorge Nichols is a 25 y.o. male who presents for evaluation of right-sided facial swelling and pain 1 day. Patient states symptoms started as a toothache yesterday and progressively gotten worse.   Past Medical History  Diagnosis Date  . Hypertension   . Hyperlipidemia   . Chronic headaches   . Anxiety   . Kidney stones   . Kidney stones   . Thyroid disease   . Chronic hip pain     Patient Active Problem List   Diagnosis Date Noted  . Vitamin D deficiency 06/21/2015  . Other hyperparathyroidism (HCC) 06/05/2015  . BP (high blood pressure) 05/13/2015  . Calculus of kidney 05/13/2015  . Arthralgia of hip 12/04/2013  . Iliopsoas bursitis 12/04/2013  . History of repair of hip joint 07/03/2013  . Avascular necrosis of bone (HCC) 03/20/2013  . Mixed dyslipidemia 09/09/2012  . Metabolic syndrome 09/09/2012  . Morbid obesity (HCC) 09/09/2012  . Sleep disorder breathing 09/09/2012  . Family history of premature CAD 09/09/2012    Past Surgical History  Procedure Laterality Date  . Lithotripsy    . Joint replacement    . Total hip arthroplasty      Current Outpatient Rx  Name  Route  Sig  Dispense  Refill  . ALPRAZolam (XANAX) 0.5 MG tablet   Oral   Take 0.5 mg by mouth at bedtime as needed for anxiety.          Marland Kitchen. amoxicillin (AMOXIL) 875 MG tablet   Oral   Take 1 tablet (875 mg total) by mouth 2 (two) times daily.   20 tablet   0   . ibuprofen (ADVIL,MOTRIN) 800 MG tablet   Oral   Take 1 tablet (800 mg total) by mouth 3 (three) times daily. Patient taking differently: Take 800 mg by mouth 3 (three) times daily as needed for mild pain.    21 tablet   0   . lidocaine (XYLOCAINE) 2 % solution    Mouth/Throat   Use as directed 20 mLs in the mouth or throat as needed for mouth pain.   100 mL   0   . metoprolol (LOPRESSOR) 50 MG tablet   Oral   Take 50 mg by mouth 2 (two) times daily.         Marland Kitchen. oxyCODONE-acetaminophen (ROXICET) 5-325 MG tablet   Oral   Take 1-2 tablets by mouth every 4 (four) hours as needed for severe pain.   15 tablet   0   . potassium citrate (UROCIT-K) 10 MEQ (1080 MG) SR tablet   Oral   Take 10 mEq by mouth 3 (three) times daily.      3   . Vitamin D, Ergocalciferol, (DRISDOL) 50000 units CAPS capsule   Oral   Take 1 capsule (50,000 Units total) by mouth every 7 (seven) days.   16 capsule   0     Allergies Review of patient's allergies indicates no known allergies.  Family History  Problem Relation Age of Onset  . Hypertension Father     Social History Social History  Substance Use Topics  . Smoking status: Current Every Day Smoker -- 0.50 packs/day  . Smokeless tobacco: Current User    Types: Snuff  . Alcohol Use: 3.0 oz/week  5 Cans of beer per week     Comment: occassionally    Review of Systems Constitutional: No fever/chills Eyes: No visual changes. ENT: Positive for right-sided lower dental pain with facial swelling. Cardiovascular: Denies chest pain. Respiratory: Denies shortness of breath. Musculoskeletal: Negative for back pain. Skin: Negative for rash. Neurological: Negative for headaches, focal weakness or numbness.  10-point ROS otherwise negative.  ____________________________________________   PHYSICAL EXAM:  VITAL SIGNS: ED Triage Vitals  Enc Vitals Group     BP 08/27/15 1425 140/89 mmHg     Pulse Rate 08/27/15 1425 83     Resp 08/27/15 1425 20     Temp 08/27/15 1425 98.4 F (36.9 C)     Temp Source 08/27/15 1425 Oral     SpO2 08/27/15 1425 100 %     Weight 08/27/15 1425 260 lb (117.935 kg)     Height 08/27/15 1425  (1.727 m)     Head Cir --      Peak Flow --      Pain Score 08/27/15  1426 7     Pain Loc --      Pain Edu? --      Excl. in GC? --     Constitutional: Alert and oriented. Well appearing and in no acute distress. Head: Atraumatic. Mouth/Throat: Mucous membranes are moist.  Oropharynx non-erythematous.Obvious dental caries with gums slightly erythematous and edematous. Tender to the right upper cheek. Neck: No stridor.   Cardiovascular: Normal rate, regular rhythm. Grossly normal heart sounds.  Good peripheral circulation. Respiratory: Normal respiratory effort.  No retractions. Lungs CTAB. Neurologic:  Normal speech and language. No gross focal neurologic deficits are appreciated. No gait instability. Skin:  Skin is warm, dry and intact. No rash noted. Psychiatric: Mood and affect are normal. Speech and behavior are normal.  ____________________________________________   LABS (all labs ordered are listed, but only abnormal results are displayed)  Labs Reviewed - No data to display ____________________________________________   PROCEDURES  Procedure(s) performed: None  Critical Care performed: No  ____________________________________________   INITIAL IMPRESSION / ASSESSMENT AND PLAN / ED COURSE  Pertinent labs & imaging results that were available during my care of the patient were reviewed by me and considered in my medical decision making (see chart for details).  Abscess right upper tooth and gums. Rx given for on a 75 twice a day, Percocet 5/325 and lidocaine viscus. Patient follow-up with PCP or return to ER with worsening symptomology. Encouraged to use a dentist as soon as possible. ____________________________________________   FINAL CLINICAL IMPRESSION(S) / ED DIAGNOSES  Final diagnoses:  Pain due to dental caries     This chart was dictated using voice recognition software/Dragon. Despite best efforts to proofread, errors can occur which can change the meaning. Any change was purely unintentional.   Evangeline Dakin,  PA-C 08/27/15 1719  Jeanmarie Plant, MD 08/28/15 2204

## 2015-08-27 NOTE — Discharge Instructions (Signed)

## 2015-09-10 DIAGNOSIS — K0381 Cracked tooth: Secondary | ICD-10-CM | POA: Diagnosis not present

## 2015-09-10 DIAGNOSIS — I10 Essential (primary) hypertension: Secondary | ICD-10-CM | POA: Diagnosis not present

## 2015-09-16 ENCOUNTER — Encounter: Payer: Self-pay | Admitting: Emergency Medicine

## 2015-09-16 ENCOUNTER — Emergency Department
Admission: EM | Admit: 2015-09-16 | Discharge: 2015-09-16 | Disposition: A | Discharge status: Home / Self Care | Payer: Medicare Other | Attending: Emergency Medicine | Admitting: Emergency Medicine

## 2015-09-16 DIAGNOSIS — E785 Hyperlipidemia, unspecified: Secondary | ICD-10-CM | POA: Insufficient documentation

## 2015-09-16 DIAGNOSIS — Z79899 Other long term (current) drug therapy: Secondary | ICD-10-CM | POA: Diagnosis not present

## 2015-09-16 DIAGNOSIS — I10 Essential (primary) hypertension: Secondary | ICD-10-CM | POA: Diagnosis not present

## 2015-09-16 DIAGNOSIS — F17290 Nicotine dependence, other tobacco product, uncomplicated: Secondary | ICD-10-CM | POA: Insufficient documentation

## 2015-09-16 DIAGNOSIS — K0889 Other specified disorders of teeth and supporting structures: Secondary | ICD-10-CM | POA: Diagnosis not present

## 2015-09-16 DIAGNOSIS — Z791 Long term (current) use of non-steroidal anti-inflammatories (NSAID): Secondary | ICD-10-CM | POA: Diagnosis not present

## 2015-09-16 MED ORDER — OXYCODONE-ACETAMINOPHEN 7.5-325 MG PO TABS: 1.0000 | ORAL_TABLET | ORAL | Status: DC | PRN

## 2015-09-16 MED ORDER — IBUPROFEN 800 MG PO TABS: 800.0000 mg | ORAL_TABLET | Freq: Three times a day (TID) | ORAL | Status: DC | PRN

## 2015-09-16 NOTE — ED Notes (Signed)
Tooth pain since yesterday   States tooth broke at that time

## 2015-09-16 NOTE — ED Provider Notes (Signed)
[facility] Emergency Department Provider Note   ____________________________________________  Time seen: Approximately 2:36 PM  I have reviewed the triage vital signs and the nursing notes.   HISTORY  Chief Complaint No chief complaint on file.    HPI Jorge Nichols is a 25 y.o. male patient complaining of dental pain to the left upper molar. Patient was seen 08/28/2015 and [facility] Dental clinic extracted one tooth and he is scheduled to have the next tooth extracted in 9 days. Patient states she's had another 2 day broke off last night while he was sleeping. Patient rates his pain as a 7/10.  Past Medical History  Diagnosis Date  . Hypertension   . Hyperlipidemia   . Chronic headaches   . Anxiety   . Kidney stones   . Kidney stones   . Thyroid disease   . Chronic hip pain     Patient Active Problem List   Diagnosis Date Noted  . Vitamin D deficiency 06/21/2015  . Other hyperparathyroidism (HCC) 06/05/2015  . BP (high blood pressure) 05/13/2015  . Calculus of kidney 05/13/2015  . Arthralgia of hip 12/04/2013  . Iliopsoas bursitis 12/04/2013  . History of repair of hip joint 07/03/2013  . Avascular necrosis of bone (HCC) 03/20/2013  . Mixed dyslipidemia 09/09/2012  . Metabolic syndrome 09/09/2012  . Morbid obesity (HCC) 09/09/2012  . Sleep disorder breathing 09/09/2012  . Family history of premature CAD 09/09/2012    Past Surgical History  Procedure Laterality Date  . Lithotripsy    . Joint replacement    . Total hip arthroplasty      Current Outpatient Rx  Name  Route  Sig  Dispense  Refill  . ALPRAZolam (XANAX) 0.5 MG tablet   Oral   Take 0.5 mg by mouth at bedtime as needed for anxiety.          Marland Kitchen. amoxicillin (AMOXIL) 875 MG tablet   Oral   Take 1 tablet (875 mg total) by mouth 2 (two) times daily.   20 tablet   0   . ibuprofen (ADVIL,MOTRIN) 800 MG tablet   Oral   Take 1 tablet (800  mg total) by mouth 3 (three) times daily. Patient taking differently: Take 800 mg by mouth 3 (three) times daily as needed for mild pain.    21 tablet   0   . ibuprofen (ADVIL,MOTRIN) 800 MG tablet   Oral   Take 1 tablet (800 mg total) by mouth every 8 (eight) hours as needed for moderate pain.   15 tablet   0   . lidocaine (XYLOCAINE) 2 % solution   Mouth/Throat   Use as directed 20 mLs in the mouth or throat as needed for mouth pain.   100 mL   0   . metoprolol (LOPRESSOR) 50 MG tablet   Oral   Take 50 mg by mouth 2 (two) times daily.         Marland Kitchen. oxyCODONE-acetaminophen (PERCOCET) 7.5-325 MG tablet   Oral   Take 1 tablet by mouth every 4 (four) hours as needed for severe pain.   20 tablet   0   . oxyCODONE-acetaminophen (ROXICET) 5-325 MG tablet   Oral   Take 1-2 tablets by mouth every 4 (four) hours as needed for severe pain.   15 tablet   0   . potassium citrate (UROCIT-K) 10 MEQ (1080 MG) SR tablet   Oral   Take 10 mEq by  mouth 3 (three) times daily.      3   . Vitamin D, Ergocalciferol, (DRISDOL) 50000 units CAPS capsule   Oral   Take 1 capsule (50,000 Units total) by mouth every 7 (seven) days.   16 capsule   0     Allergies Review of patient's allergies indicates no known allergies.  Family History  Problem Relation Age of Onset  . Hypertension Father     Social History Social History  Substance Use Topics  . Smoking status: Current Every Day Smoker -- 0.50 packs/day  . Smokeless tobacco: Current User    Types: Snuff  . Alcohol Use: 3.0 oz/week    5 Cans of beer per week     Comment: occassionally    Review of Systems Constitutional: No fever/chills Eyes: No visual changes. ENT: No sore throat. The pain and facial edema Cardiovascular: Denies chest pain. Respiratory: Denies shortness of breath. Gastrointestinal: No abdominal pain.  No nausea, no vomiting.  No diarrhea.  No constipation. Genitourinary: Negative for  dysuria. Musculoskeletal: Negative for back pain. Skin: Negative for rash. Neurological: Negative for headaches, focal weakness or numbness.    ____________________________________________   PHYSICAL EXAM:  VITAL SIGNS: ED Triage Vitals  Enc Vitals Group     BP 09/16/15 1352 167/108 mmHg     Pulse Rate 09/16/15 1352 94     Resp 09/16/15 1352 20     Temp 09/16/15 1352 98.2 F (36.8 C)     Temp Source 09/16/15 1352 Oral     SpO2 --      Weight --      Height --      Head Cir --      Peak Flow --      Pain Score 09/16/15 1345 7     Pain Loc --      Pain Edu? --      Excl. in GC? --     Constitutional: Alert and oriented. Well appearing and in no acute distress. Eyes: Conjunctivae are normal. PERRL. EOMI. Head: Atraumatic. Nose: No congestion/rhinnorhea. Mouth/Throat: Mucous membranes are moist.  Oropharynx non-erythematous. Neck: No stridor.  No cervical spine tenderness to palpation. Hematological/Lymphatic/Immunilogical: No cervical lymphadenopathy. Cardiovascular: Normal rate, regular rhythm. Grossly normal heart sounds.  Good peripheral circulation. Respiratory: Normal respiratory effort.  No retractions. Lungs CTAB. Gastrointestinal: Soft and nontender. No distention. No abdominal bruits. No CVA tenderness. Musculoskeletal: No lower extremity tenderness nor edema.  No joint effusions. Neurologic:  Normal speech and language. No gross focal neurologic deficits are appreciated. No gait instability. Skin:  Skin is warm, dry and intact. No rash noted. Psychiatric: Mood and affect are normal. Speech and behavior are normal.  ____________________________________________   LABS (all labs ordered are listed, but only abnormal results are displayed)  Labs Reviewed - No data to  display ____________________________________________  EKG   ____________________________________________  RADIOLOGY   ____________________________________________   PROCEDURES  Procedure(s) performed: None  Critical Care performed: No  ____________________________________________   INITIAL IMPRESSION / ASSESSMENT AND PLAN / ED COURSE  Pertinent labs & imaging results that were available during my care of the patient were reviewed by me and considered in my medical decision making (see chart for details).  Dental pain secondary to fractured tooth. Otherwise patient is to follow-up with scheduled dental appointment next week. Patient given a prescription for Percocet and ibuprofen. ____________________________________________   FINAL CLINICAL IMPRESSION(S) / ED DIAGNOSES  Final diagnoses:  Pain, dental      NEW MEDICATIONS STARTED  DURING THIS VISIT:  New Prescriptions   IBUPROFEN (ADVIL,MOTRIN) 800 MG TABLET    Take 1 tablet (800 mg total) by mouth every 8 (eight) hours as needed for moderate pain.   OXYCODONE-ACETAMINOPHEN (PERCOCET) 7.5-325 MG TABLET    Take 1 tablet by mouth every 4 (four) hours as needed for severe pain.     Note:  This document was prepared using Dragon voice recognition software and may include unintentional dictation errors.    Joni ReiningRonald K Princeston Blizzard, PA-C 09/16/15 1449  Jennye MoccasinBrian S Quigley, MD 09/16/15 [phone]

## 2015-09-16 NOTE — ED Notes (Signed)
Pt in via triage with complaints of dental pain to left side since last night.  Pt reports needing some teeth pulled, having one pulled two weeks ago and next appointment scheduled for next Wed to have remainder pulled.  Pt reports part of tooth breaking off last night while eating.  Pt A/O.4, no immediate distress at this time.

## 2015-09-16 NOTE — Discharge Instructions (Signed)
Follow-up with scheduled dental appointment. °

## 2015-09-24 ENCOUNTER — Emergency Department (HOSPITAL_COMMUNITY)
Admission: EM | Admit: 2015-09-24 | Discharge: 2015-09-25 | Disposition: A | Discharge status: Home / Self Care | Payer: BLUE CROSS/BLUE SHIELD | Attending: Emergency Medicine | Admitting: Emergency Medicine

## 2015-09-24 ENCOUNTER — Encounter (HOSPITAL_COMMUNITY): Payer: Self-pay

## 2015-09-24 DIAGNOSIS — F17200 Nicotine dependence, unspecified, uncomplicated: Secondary | ICD-10-CM | POA: Diagnosis not present

## 2015-09-24 DIAGNOSIS — I10 Essential (primary) hypertension: Secondary | ICD-10-CM | POA: Diagnosis not present

## 2015-09-24 DIAGNOSIS — E785 Hyperlipidemia, unspecified: Secondary | ICD-10-CM | POA: Insufficient documentation

## 2015-09-24 DIAGNOSIS — R109 Unspecified abdominal pain: Secondary | ICD-10-CM

## 2015-09-24 DIAGNOSIS — Z79899 Other long term (current) drug therapy: Secondary | ICD-10-CM | POA: Insufficient documentation

## 2015-09-24 DIAGNOSIS — R1032 Left lower quadrant pain: Secondary | ICD-10-CM | POA: Insufficient documentation

## 2015-09-24 LAB — URINE MICROSCOPIC-ADD ON

## 2015-09-24 LAB — URINALYSIS, ROUTINE W REFLEX MICROSCOPIC
BILIRUBIN URINE: NEGATIVE
Glucose, UA: NEGATIVE mg/dL
KETONES UR: NEGATIVE mg/dL
LEUKOCYTES UA: NEGATIVE
Nitrite: NEGATIVE
PH: 6 (ref 5.0–8.0)
Protein, ur: NEGATIVE mg/dL
Specific Gravity, Urine: 1.02 (ref 1.005–1.030)

## 2015-09-24 NOTE — ED Notes (Signed)
Pt reports that he has a history of kidney stones. He also reports that he sees the urology group across the street. A Urine specimen is obtained with the pointing out of small particulate matter in the specimen cup by the pt.

## 2015-09-24 NOTE — ED Provider Notes (Signed)
CSN: [id number]     Arrival date & time 09/24/15  2311 History   First MD Initiated Contact with Patient 09/24/15 2352 PM    Chief Complaint  Patient presents with  . Flank Pain     (Consider location/radiation/quality/duration/timing/severity/associated sxs/prior Treatment) HPI patient states he has a long history of kidney stones. He states he passes one to 2 stones every month. He states he was last seen at [facility] about 6 weeks ago and they're going to refer him to a endocrinologist because his body is not absorbing vitamin D. He states about 2 days ago he started getting left flank pain that is now radiating into his left lower quadrant. He has had nausea without vomiting or diarrhea. He denies any fever. He states the pain is sharp in his back and is constant. Nothing makes the pain hurt more however hot baths or heating pad and walking makes the pain feel better.  PCP none [facility]  Past Medical History  Diagnosis Date  . Hypertension   . Hyperlipidemia   . Chronic headaches   . Anxiety   . Kidney stones   . Kidney stones   . Thyroid disease   . Chronic hip pain    Past Surgical History  Procedure Laterality Date  . Lithotripsy    . Joint replacement    . Total hip arthroplasty     Family History  Problem Relation Age of Onset  . Hypertension Father    Social History  Substance Use Topics  . Smoking status: Current Every Day Smoker -- 0.50 packs/day  . Smokeless tobacco: Current User    Types: Snuff  . Alcohol Use: 3.0 oz/week    5 Cans of beer per week     Comment: occassionally  on disability b/o bilateral hip replacements after fall from a tree stand in 2014  Review of Systems  All other systems reviewed and are negative.     Allergies  Review of patient's allergies indicates no known allergies.  Home Medications   Prior to Admission medications   Medication Sig Start Date End Date Taking? Authorizing Provider  amoxicillin (AMOXIL)  875 MG tablet Take 1 tablet (875 mg total) by mouth 2 (two) times daily. 08/27/15  Yes Charmayne Sheer Beers, PA-C  ibuprofen (ADVIL,MOTRIN) 800 MG tablet Take 1 tablet (800 mg total) by mouth every 8 (eight) hours as needed for moderate pain. 09/16/15  Yes Joni Reining, PA-C  lidocaine (XYLOCAINE) 2 % solution Use as directed 20 mLs in the mouth or throat as needed for mouth pain. 08/27/15  Yes Charmayne Sheer Beers, PA-C  metoprolol (LOPRESSOR) 50 MG tablet Take 50 mg by mouth 2 (two) times daily.   Yes Historical Provider, MD  potassium citrate (UROCIT-K) 10 MEQ (1080 MG) SR tablet Take 10 mEq by mouth 3 (three) times daily. 07/02/15  Yes Historical Provider, MD  Vitamin D, Ergocalciferol, (DRISDOL) 50000 units CAPS capsule Take 1 capsule (50,000 Units total) by mouth every 7 (seven) days. 06/21/15  Yes Roma Kayser, MD  ALPRAZolam Prudy Feeler) 0.5 MG tablet Take 0.5 mg by mouth at bedtime as needed for anxiety.     Historical Provider, MD  cyclobenzaprine (FLEXERIL) 5 MG tablet Take 1 tablet (5 mg total) by mouth 3 (three) times daily as needed. 09/25/15   Devoria Albe, MD  ibuprofen (ADVIL,MOTRIN) 800 MG tablet Take 1 tablet (800 mg total) by mouth 3 (three) times daily. Patient taking differently: Take 800 mg by mouth 3 (  three) times daily as needed for mild pain.  03/17/15   Eber HongBrian Miller, MD  naproxen (NAPROSYN) 500 MG tablet Take 1 po BID with food prn pain 09/25/15   Devoria AlbeIva Murvin Gift, MD  oxyCODONE-acetaminophen (PERCOCET) 7.5-325 MG tablet Take 1 tablet by mouth every 4 (four) hours as needed for severe pain. 09/16/15 09/15/16  Joni Reiningonald K Smith, PA-C  oxyCODONE-acetaminophen (ROXICET) 5-325 MG tablet Take 1-2 tablets by mouth every 4 (four) hours as needed for severe pain. 08/27/15   Charmayne Sheerharles M Beers, PA-C   BP 137/74/ Pulse 83  Temp(Src) 98.2 F (36.8 C) (Oral)  Resp 20  Ht 5\' 8"  (1.727 m)  Wt 270 lb (122.471 kg)  BMI 41.06 kg/m2  SpO2 98%  Vital signs normal    Physical Exam  Constitutional: He is oriented to  person, place, and time. He appears well-developed and well-nourished.  Non-toxic appearance. He does not appear ill. No distress.  Lying quietly and calmly on the stretcher.  HENT:  Head: Normocephalic and atraumatic.  Right Ear: External ear normal.  Left Ear: External ear normal.  Nose: Nose normal. No mucosal edema or rhinorrhea.  Mouth/Throat: Oropharynx is clear and moist and mucous membranes are normal. No dental abscesses or uvula swelling.  Eyes: Conjunctivae and EOM are normal. Pupils are equal, round, and reactive to light.  Neck: Normal range of motion and full passive range of motion without pain. Neck supple.  Cardiovascular: Normal rate, regular rhythm and normal heart sounds.  Exam reveals no gallop and no friction rub.   No murmur heard. Pulmonary/Chest: Effort normal and breath sounds normal. No respiratory distress. He has no wheezes. He has no rhonchi. He has no rales. He exhibits no tenderness and no crepitus.  Abdominal: Soft. Normal appearance and bowel sounds are normal. He exhibits no distension. There is no tenderness. There is no rebound and no guarding.    Area of pain noted  Musculoskeletal: Normal range of motion. He exhibits no edema or tenderness.       Back:  Moves all extremities well. Area of pain noted.  Neurological: He is alert and oriented to person, place, and time. He has normal strength. No cranial nerve deficit.  Skin: Skin is warm, dry and intact. No rash noted. No erythema. No pallor.  Psychiatric: He has a normal mood and affect. His speech is normal and behavior is normal. His mood appears not anxious.  Nursing note and vitals reviewed.   ED Course  Procedures (including critical care time)  Medications  ketorolac (TORADOL) injection 60 mg (60 mg Intramuscular Given 09/25/15 0010)   Patient is a frequent ED visitor. He reports in the nurse verified there was a small black stone in his urine cup when he gave a urine sample. Patient was  reassured that this is good that he most likely passed his stone. He was given Toradol 60 mg IM.  Recheck at 12:45 AM patient is planning on his cell phone. He states his pain is improved. He was discharged.   Review of patient's prior ED visits which is 10 in the past 6 months he was seen on June 26 and June 6 at [facility] emergency department for dental pain. Patient has had 16 visits to the ED for possible kidney stone in the past year. Review of the [place] database shows patient has 14 narcotic prescriptions filled since January 24. These are from multiple providers from multiple cities and multiple specialties. In May he got a narcotic cough syrup, #  30 oxycodone 5/325, #20 tramadol 50 mg, 10 hydrocodone 5/325 and 8 hydrocodone 5/25. In June he got 15 oxycodone 5/325 and #20 oxycodone 7.5/325.  Labs Review Results for orders placed or performed during the hospital encounter of 09/24/15  Urinalysis, Routine w reflex microscopic (not at [facility])  Result Value Ref Range   Color, Urine YELLOW YELLOW   APPearance CLEAR CLEAR   Specific Gravity, Urine 1.020 1.005 - 1.030   pH 6.0 5.0 - 8.0   Glucose, UA NEGATIVE NEGATIVE mg/dL   Hgb urine dipstick LARGE (A) NEGATIVE   Bilirubin Urine NEGATIVE NEGATIVE   Ketones, ur NEGATIVE NEGATIVE mg/dL   Protein, ur NEGATIVE NEGATIVE mg/dL   Nitrite NEGATIVE NEGATIVE   Leukocytes, UA NEGATIVE NEGATIVE  Urine microscopic-add on  Result Value Ref Range   Squamous Epithelial / LPF 0-5 (A) NONE SEEN   WBC, UA 0-5 0 - 5 WBC/hpf   RBC / HPF TOO NUMEROUS TO COUNT 0 - 5 RBC/hpf   Bacteria, UA RARE (A) NONE SEEN   Laboratory interpretation all normal except Chronic hematuria     Imaging Review No results found.    Abd/pelvis CT scan 08/09/2013 CLINICAL DATA: 25 year old male with right flank pain. Painful urination.  EXAM: CT ABDOMEN AND PELVIS WITHOUT CONTRAST  TECHNIQUE: Multidetector CT imaging of the abdomen and pelvis was  performed following the standard protocol without IV contrast.  COMPARISON: CT dated 05/02/2015   There are small nonobstructing bilateral renal calculi. The largest stone measures approximately 5 mm in the inferior pole of the right kidney is similar to prior study. There is no hydronephrosis on either side. The visualized ureters appear unremarkable. The urinary bladder is collapsed. The prostate and seminal vesicles are not well visualized due to streak artifact.   IMPRESSION: Small nonobstructing bilateral renal calculi. No hydronephrosis.   Electronically Signed  By: Elgie CollardArash Radparvar M.D.  On: 08/10/2015 03:08    I have personally reviewed and evaluated these images and lab results as part of my medical decision-making.    MDM   patient presents with chronic complains of having kidney stones. His most recent CT scan from 2 months ago shows the stones are all small, so even if he was passing another stone he should be able to pass it without intervention. He also reportedly passed a small stone in his urine sample tonight. Patient's pain improved with nonsteroidal anti-inflammatory drugs.    Final diagnoses:  Left flank pain  LLQ pain    New Prescriptions   CYCLOBENZAPRINE (FLEXERIL) 5 MG TABLET    Take 1 tablet (5 mg total) by mouth 3 (three) times daily as needed.   NAPROXEN (NAPROSYN) 500 MG TABLET    Take 1 po BID with food prn pain    Plan discharge  Devoria AlbeIva Kayon Dozier, MD, Concha PyoFACEP     Karsten Vaughn, MD 09/25/15 [phone]

## 2015-09-24 NOTE — ED Notes (Signed)
Left flank pain x 2 days, has history of kidney stones in the past

## 2015-09-25 MED ORDER — NAPROXEN 500 MG PO TABS: ORAL_TABLET | ORAL | Status: DC

## 2015-09-25 MED ORDER — CYCLOBENZAPRINE HCL 5 MG PO TABS: 5.0000 mg | ORAL_TABLET | Freq: Three times a day (TID) | ORAL | Status: DC | PRN

## 2015-09-25 MED ORDER — KETOROLAC TROMETHAMINE 60 MG/2ML IM SOLN
60.0000 mg | Freq: Once | INTRAMUSCULAR | Status: AC
  Administered 2015-09-25: 60 mg via INTRAMUSCULAR
  Filled 2015-09-25: qty 2

## 2015-09-25 NOTE — Discharge Instructions (Signed)
Drink plenty of fluids. Use heat for comfort. Take the medication as prescribed. Recheck if you get uncontrolled vomiting, fever, or if you feel like your pain is getting worse.

## 2015-10-15 ENCOUNTER — Ambulatory Visit: Payer: BLUE CROSS/BLUE SHIELD | Admitting: Urology

## 2015-10-17 DIAGNOSIS — M25552 Pain in left hip: Secondary | ICD-10-CM | POA: Diagnosis not present

## 2015-10-17 DIAGNOSIS — M545 Low back pain: Secondary | ICD-10-CM | POA: Diagnosis not present

## 2015-10-17 DIAGNOSIS — G8929 Other chronic pain: Secondary | ICD-10-CM | POA: Diagnosis not present

## 2015-10-17 DIAGNOSIS — Z96642 Presence of left artificial hip joint: Secondary | ICD-10-CM | POA: Diagnosis not present

## 2015-10-21 ENCOUNTER — Other Ambulatory Visit: Payer: Self-pay | Admitting: Orthopedic Surgery

## 2015-10-21 DIAGNOSIS — M545 Low back pain, unspecified: Secondary | ICD-10-CM

## 2015-10-21 DIAGNOSIS — M79604 Pain in right leg: Secondary | ICD-10-CM

## 2015-10-23 ENCOUNTER — Encounter: Payer: Self-pay | Admitting: "Endocrinology

## 2015-10-23 ENCOUNTER — Other Ambulatory Visit: Payer: Self-pay | Admitting: *Deleted

## 2015-10-23 ENCOUNTER — Ambulatory Visit (INDEPENDENT_AMBULATORY_CARE_PROVIDER_SITE_OTHER): Payer: BLUE CROSS/BLUE SHIELD | Admitting: "Endocrinology

## 2015-10-23 VITALS — BP 125/89 | HR 101 | Wt 283.2 lb

## 2015-10-23 DIAGNOSIS — E212 Other hyperparathyroidism: Secondary | ICD-10-CM

## 2015-10-23 DIAGNOSIS — E559 Vitamin D deficiency, unspecified: Secondary | ICD-10-CM | POA: Diagnosis not present

## 2015-10-23 MED ORDER — VITAMIN D (ERGOCALCIFEROL) 1.25 MG (50000 UNIT) PO CAPS: 50000.0000 [IU] | ORAL_CAPSULE | ORAL | 0 refills | Status: DC

## 2015-10-23 NOTE — Progress Notes (Signed)
Subjective:    Patient ID: Jorge Nichols, male    DOB: 1991-03-17, PCP No PCP Per Patient   Past Medical History:  Diagnosis Date  . Anxiety   . Chronic headaches   . Chronic hip pain   . Hyperlipidemia   . Hypertension   . Kidney stones   . Kidney stones   . Thyroid disease    Past Surgical History:  Procedure Laterality Date  . JOINT REPLACEMENT    . LITHOTRIPSY    . TOTAL HIP ARTHROPLASTY     Social History   Social History  . Marital status: Married    Spouse name: N/A  . Number of children: N/A  . Years of education: N/A   Social History Main Topics  . Smoking status: Current Every Day Smoker    Packs/day: 0.50  . Smokeless tobacco: Current User    Types: Snuff  . Alcohol use 3.0 oz/week    5 Cans of beer per week     Comment: occassionally  . Drug use: No  . Sexual activity: Not Asked   Other Topics Concern  . None   Social History Narrative  . None   Outpatient Encounter Prescriptions as of 10/23/2015  Medication Sig  . ibuprofen (ADVIL,MOTRIN) 800 MG tablet Take 1 tablet (800 mg total) by mouth 3 (three) times daily. (Patient taking differently: Take 800 mg by mouth 3 (three) times daily as needed for mild pain. )  . metoprolol (LOPRESSOR) 50 MG tablet Take 50 mg by mouth 2 (two) times daily.  . potassium citrate (UROCIT-K) 10 MEQ (1080 MG) SR tablet Take 10 mEq by mouth 3 (three) times daily.  . Tamsulosin HCl (FLOMAX PO) Take by mouth daily.  . Vitamin D, Ergocalciferol, (DRISDOL) 50000 units CAPS capsule Take 1 capsule (50,000 Units total) by mouth every 7 (seven) days.  . [DISCONTINUED] Vitamin D, Ergocalciferol, (DRISDOL) 50000 units CAPS capsule Take 1 capsule (50,000 Units total) by mouth every 7 (seven) days.  . ALPRAZolam (XANAX) 0.5 MG tablet Take 0.5 mg by mouth at bedtime as needed for anxiety.   . [DISCONTINUED] amoxicillin (AMOXIL) 875 MG tablet Take 1 tablet (875 mg total) by mouth 2 (two) times daily.  . [DISCONTINUED]  cyclobenzaprine (FLEXERIL) 5 MG tablet Take 1 tablet (5 mg total) by mouth 3 (three) times daily as needed.  . [DISCONTINUED] ibuprofen (ADVIL,MOTRIN) 800 MG tablet Take 1 tablet (800 mg total) by mouth every 8 (eight) hours as needed for moderate pain.  . [DISCONTINUED] lidocaine (XYLOCAINE) 2 % solution Use as directed 20 mLs in the mouth or throat as needed for mouth pain.  . [DISCONTINUED] naproxen (NAPROSYN) 500 MG tablet Take 1 po BID with food prn pain  . [DISCONTINUED] oxyCODONE-acetaminophen (PERCOCET) 7.5-325 MG tablet Take 1 tablet by mouth every 4 (four) hours as needed for severe pain.  . [DISCONTINUED] oxyCODONE-acetaminophen (ROXICET) 5-325 MG tablet Take 1-2 tablets by mouth every 4 (four) hours as needed for severe pain.   No facility-administered encounter medications on file as of 10/23/2015.    ALLERGIES: No Known Allergies VACCINATION STATUS:  There is no immunization history on file for this patient.  HPI  Mr. Bonelli is 25 year old male with medical history as above. He is returning for follow-up after repeat PTH/calcium. He was supposed to have 24-hour urine calcium, however this test was not completed. Medical history significant for nephrolithiasis starting from age 71. Per his report he passes stone every month, underwent lithotripsy at least  once, and stone retrieval at least on 2 occasions. He denies history of hypercalcemia nor any intervention for hypercalcemia in the past. He denies family history of similar condition. He denies history of frequent urinary tract infections.  He has history of avascular necrosis of bilateral hip status post bilateral hip replacement. He is a chronic active smoker. He denies any neck swelling. He does not recall if he had any bone density measurement. denies any history of trivial bone fracture. He reports being overweight/obese most of his adult life. His intake of dairy products is average. He is not taking any calcium  supplements. View of his medical records shows absence of hypercalcemia. Highest calcium he ever had such appears to be 10.1 associated with elevated PTH of 84 included in his referral package. - Repeat labs show PTH improving to 42 along with improved calcium of 9.1.   Review of Systems Constitutional: + heavy weight , no fatigue, no subjective hyperthermia/hypothermia Eyes: no blurry vision, no xerophthalmia ENT: no sore throat, no nodules palpated in throat, no dysphagia/odynophagia, no hoarseness Cardiovascular: no CP/SOB/palpitations/leg swelling Respiratory: no cough/SOB Gastrointestinal: no N/V/D/C Musculoskeletal: no muscle/joint aches Skin: no rashes Neurological: no tremors/numbness/tingling/dizziness Psychiatric: no depression/anxiety  Objective:    BP 125/89   Pulse (!) 101   Wt 283 lb 4 oz (128.5 kg)   BMI 43.07 kg/m   Wt Readings from Last 3 Encounters:  10/23/15 283 lb 4 oz (128.5 kg)  09/24/15 270 lb (122.5 kg)  08/27/15 260 lb (117.9 kg)    Physical Exam\  Constitutional: obese, in NAD Eyes: PERRLA, EOMI, no exophthalmos ENT: moist mucous membranes, no thyromegaly, no cervical lymphadenopathy Cardiovascular: RRR, No MRG Respiratory: CTA B Gastrointestinal: abdomen soft, obese, NT, ND, BS+ Musculoskeletal: no deformities, strength intact in all 4 Skin: moist, warm, no rashes Neurological: no tremor with outstretched hands, DTR normal in all 4  Recent Results (from the past 2160 hour(s))  CBC     Status: None   Collection Time: 07/31/15  7:15 PM  Result Value Ref Range   WBC 7.6 3.8 - 10.6 K/uL   RBC 4.63 4.40 - 5.90 MIL/uL   Hemoglobin 15.0 13.0 - 18.0 g/dL   HCT 45.0 40.0 - 52.0 %   MCV 97.2 80.0 - 100.0 fL   MCH 32.4 26.0 - 34.0 pg   MCHC 33.3 32.0 - 36.0 g/dL   RDW 13.9 11.5 - 14.5 %   Platelets 271 150 - 440 K/uL  Basic metabolic panel     Status: Abnormal   Collection Time: 07/31/15  7:15 PM  Result Value Ref Range   Sodium 141 135 - 145  mmol/L   Potassium 3.9 3.5 - 5.1 mmol/L   Chloride 110 101 - 111 mmol/L   CO2 24 22 - 32 mmol/L   Glucose, Bld 120 (H) 65 - 99 mg/dL   BUN 10 6 - 20 mg/dL   Creatinine, Ser 1.07 0.61 - 1.24 mg/dL   Calcium 9.4 8.9 - 10.3 mg/dL   GFR calc non Af Amer >60 >60 mL/min   GFR calc Af Amer >60 >60 mL/min    Comment: (NOTE) The eGFR has been calculated using the CKD EPI equation. This calculation has not been validated in all clinical situations. eGFR's persistently <60 mL/min signify possible Chronic Kidney Disease.    Anion gap 7 5 - 15  Urinalysis complete, with microscopic ([facility] only)     Status: Abnormal   Collection Time: 07/31/15  7:15 PM  Result Value  Ref Range   Color, Urine YELLOW (A) YELLOW   APPearance CLEAR (A) CLEAR   Glucose, UA NEGATIVE NEGATIVE mg/dL   Bilirubin Urine NEGATIVE NEGATIVE   Ketones, ur NEGATIVE NEGATIVE mg/dL   Specific Gravity, Urine 1.017 1.005 - 1.030   Hgb urine dipstick 3+ (A) NEGATIVE   pH 5.0 5.0 - 8.0   Protein, ur NEGATIVE NEGATIVE mg/dL   Nitrite NEGATIVE NEGATIVE   Leukocytes, UA NEGATIVE NEGATIVE   RBC / HPF TOO NUMEROUS TO COUNT 0 - 5 RBC/hpf   WBC, UA 0-5 0 - 5 WBC/hpf   Bacteria, UA NONE SEEN NONE SEEN   Squamous Epithelial / LPF 0-5 (A) NONE SEEN   Mucous PRESENT   Urinalysis, Routine w reflex microscopic     Status: Abnormal   Collection Time: 08/10/15 12:10 AM  Result Value Ref Range   Color, Urine YELLOW YELLOW   APPearance CLEAR CLEAR   Specific Gravity, Urine >1.030 (H) 1.005 - 1.030   pH 5.5 5.0 - 8.0   Glucose, UA NEGATIVE NEGATIVE mg/dL   Hgb urine dipstick LARGE (A) NEGATIVE   Bilirubin Urine NEGATIVE NEGATIVE   Ketones, ur NEGATIVE NEGATIVE mg/dL   Protein, ur NEGATIVE NEGATIVE mg/dL   Nitrite NEGATIVE NEGATIVE   Leukocytes, UA NEGATIVE NEGATIVE  Urine microscopic-add on     Status: Abnormal   Collection Time: 08/10/15 12:10 AM  Result Value Ref Range   Squamous Epithelial / LPF 0-5 (A) NONE SEEN   WBC, UA 0-5 0 -  5 WBC/hpf   RBC / HPF TOO NUMEROUS TO COUNT 0 - 5 RBC/hpf   Bacteria, UA RARE (A) NONE SEEN  PTH, intact and calcium     Status: None   Collection Time: 08/13/15 11:55 AM  Result Value Ref Range   PTH 42 14 - 64 pg/mL   Calcium 9.5 8.4 - 10.5 mg/dL    Comment:   Interpretive Guide:                              Intact PTH               Calcium                              ----------               ------- Normal Parathyroid           Normal                   Normal Hypoparathyroidism           Low or Low Normal        Low Hyperparathyroidism      Primary                 Normal or High           High      Secondary               High                     Normal or Low      Tertiary                High                     High  Non-Parathyroid   Hypercalcemia              Low or Low Normal        High   VITAMIN D 25 Hydroxy (Vit-D Deficiency, Fractures)     Status: Abnormal   Collection Time: 08/13/15 11:55 AM  Result Value Ref Range   Vit D, 25-Hydroxy 25 (L) 30 - 100 ng/mL    Comment: Vitamin D Status           25-OH Vitamin D        Deficiency                <20 ng/mL        Insufficiency         20 - 29 ng/mL        Optimal             > or = 30 ng/mL   For 25-OH Vitamin D testing on patients on D2-supplementation and patients for whom quantitation of D2 and D3 fractions is required, the QuestAssureD 25-OH VIT D, (D2,D3), LC/MS/MS is recommended: order code [id number] (patients > 2 yrs).   Lipase, blood     Status: None   Collection Time: 08/20/15  9:49 AM  Result Value Ref Range   Lipase 28 11 - 51 U/L  Comprehensive metabolic panel     Status: Abnormal   Collection Time: 08/20/15  9:49 AM  Result Value Ref Range   Sodium 137 135 - 145 mmol/L   Potassium 3.9 3.5 - 5.1 mmol/L   Chloride 103 101 - 111 mmol/L   CO2 27 22 - 32 mmol/L   Glucose, Bld 111 (H) 65 - 99 mg/dL   BUN 18 6 - 20 mg/dL   Creatinine, Ser 1.10 0.61 - 1.24 mg/dL   Calcium 9.1 8.9 - 10.3 mg/dL   Total  Protein 7.1 6.5 - 8.1 g/dL   Albumin 4.2 3.5 - 5.0 g/dL   AST 30 15 - 41 U/L   ALT 40 17 - 63 U/L   Alkaline Phosphatase 53 38 - 126 U/L   Total Bilirubin 0.5 0.3 - 1.2 mg/dL   GFR calc non Af Amer >60 >60 mL/min   GFR calc Af Amer >60 >60 mL/min    Comment: (NOTE) The eGFR has been calculated using the CKD EPI equation. This calculation has not been validated in all clinical situations. eGFR's persistently <60 mL/min signify possible Chronic Kidney Disease.    Anion gap 7 5 - 15  CBC     Status: None   Collection Time: 08/20/15  9:49 AM  Result Value Ref Range   WBC 9.1 4.0 - 10.5 K/uL   RBC 4.56 4.22 - 5.81 MIL/uL   Hemoglobin 14.8 13.0 - 17.0 g/dL   HCT 44.0 39.0 - 52.0 %   MCV 96.5 78.0 - 100.0 fL   MCH 32.5 26.0 - 34.0 pg   MCHC 33.6 30.0 - 36.0 g/dL   RDW 12.6 11.5 - 15.5 %   Platelets 305 150 - 400 K/uL  Urinalysis, Routine w reflex microscopic     Status: Abnormal   Collection Time: 08/20/15 10:11 AM  Result Value Ref Range   Color, Urine YELLOW YELLOW   APPearance HAZY (A) CLEAR   Specific Gravity, Urine 1.020 1.005 - 1.030   pH 5.5 5.0 - 8.0   Glucose, UA NEGATIVE NEGATIVE mg/dL   Hgb urine dipstick LARGE (A) NEGATIVE   Bilirubin Urine NEGATIVE NEGATIVE   Ketones,  ur NEGATIVE NEGATIVE mg/dL   Protein, ur NEGATIVE NEGATIVE mg/dL   Nitrite NEGATIVE NEGATIVE   Leukocytes, UA NEGATIVE NEGATIVE  Urine microscopic-add on     Status: None   Collection Time: 08/20/15 10:11 AM  Result Value Ref Range   Squamous Epithelial / LPF NONE SEEN NONE SEEN   WBC, UA NONE SEEN 0 - 5 WBC/hpf   RBC / HPF TOO NUMEROUS TO COUNT 0 - 5 RBC/hpf   Bacteria, UA NONE SEEN NONE SEEN  Urinalysis, Routine w reflex microscopic (not at [facility])     Status: Abnormal   Collection Time: 09/24/15 11:30 PM  Result Value Ref Range   Color, Urine YELLOW YELLOW   APPearance CLEAR CLEAR   Specific Gravity, Urine 1.020 1.005 - 1.030   pH 6.0 5.0 - 8.0   Glucose, UA NEGATIVE NEGATIVE mg/dL   Hgb  urine dipstick LARGE (A) NEGATIVE   Bilirubin Urine NEGATIVE NEGATIVE   Ketones, ur NEGATIVE NEGATIVE mg/dL   Protein, ur NEGATIVE NEGATIVE mg/dL   Nitrite NEGATIVE NEGATIVE   Leukocytes, UA NEGATIVE NEGATIVE  Urine microscopic-add on     Status: Abnormal   Collection Time: 09/24/15 11:30 PM  Result Value Ref Range   Squamous Epithelial / LPF 0-5 (A) NONE SEEN   WBC, UA 0-5 0 - 5 WBC/hpf   RBC / HPF TOO NUMEROUS TO COUNT 0 - 5 RBC/hpf   Bacteria, UA RARE (A) NONE SEEN    Assessment & Plan:   1. Other hyperparathyroidism ([place]) 2.  Vitamin D deficiency. - PTH is improved to 42 , overall from  84 associated with   better calcium at 9.1  He has history of high normal calcium at 10.1 indicates possibility of at least early mild normocalcemic  hyperparathyroidism. Secondary hyperparathyroidism  as a result of severe vitamin D deficiency is contributing.  Vitamin D is improving to 25 from 14.  I will continue to replace vitamin D for the next 16 weeks.  .  The rare condition of familial hypocalcuric hypercalcemia is not ruled out . - If he is PTH and calcium going the same direction and elevating, he will need 24-hour urine calcium determination.   If he is confirmed to have primary hyperparathyroidism, he is a surgical candidate. After his next visit he will be considered for DEXA scan.  2. Calculus of kidney -likely related to #1 above. I advised him to continue follow up with urology.  He is extensively counseled against smoking. Unfortunately he doesn't seem to be ready to quit.  - I advised patient to maintain close follow up with No PCP Per Patient for primary care needs. Follow up plan: Return in about 6 months (around 04/24/2016) for follow up with pre-visit labs.  Glade Lloyd, MD Phone: [phone]  Fax: [phone]   10/23/2015, 3:48 PM

## 2015-10-24 DIAGNOSIS — K0889 Other specified disorders of teeth and supporting structures: Secondary | ICD-10-CM | POA: Diagnosis not present

## 2015-10-28 DIAGNOSIS — Z87442 Personal history of urinary calculi: Secondary | ICD-10-CM | POA: Diagnosis not present

## 2015-10-28 DIAGNOSIS — I10 Essential (primary) hypertension: Secondary | ICD-10-CM | POA: Diagnosis not present

## 2015-10-28 DIAGNOSIS — M25559 Pain in unspecified hip: Secondary | ICD-10-CM | POA: Diagnosis not present

## 2015-10-28 DIAGNOSIS — R000 Tachycardia, unspecified: Secondary | ICD-10-CM | POA: Diagnosis not present

## 2015-10-29 ENCOUNTER — Other Ambulatory Visit: Payer: Self-pay | Admitting: *Deleted

## 2015-10-29 DIAGNOSIS — R000 Tachycardia, unspecified: Secondary | ICD-10-CM

## 2015-10-29 NOTE — Progress Notes (Signed)
Event monitor ordered and patient notified

## 2015-11-01 ENCOUNTER — Ambulatory Visit
Admission: RE | Admit: 2015-11-01 | Discharge: 2015-11-01 | Disposition: A | Discharge status: Home / Self Care | Payer: Medicare Other | Source: Ambulatory Visit | Attending: Orthopedic Surgery | Admitting: Orthopedic Surgery

## 2015-11-01 ENCOUNTER — Encounter (INDEPENDENT_AMBULATORY_CARE_PROVIDER_SITE_OTHER): Payer: BLUE CROSS/BLUE SHIELD

## 2015-11-01 DIAGNOSIS — R000 Tachycardia, unspecified: Secondary | ICD-10-CM | POA: Diagnosis not present

## 2015-11-01 DIAGNOSIS — M5126 Other intervertebral disc displacement, lumbar region: Secondary | ICD-10-CM | POA: Diagnosis not present

## 2015-11-01 DIAGNOSIS — M5127 Other intervertebral disc displacement, lumbosacral region: Secondary | ICD-10-CM | POA: Insufficient documentation

## 2015-11-01 DIAGNOSIS — M79604 Pain in right leg: Secondary | ICD-10-CM

## 2015-11-01 DIAGNOSIS — M545 Low back pain, unspecified: Secondary | ICD-10-CM

## 2015-11-01 DIAGNOSIS — M5137 Other intervertebral disc degeneration, lumbosacral region: Secondary | ICD-10-CM | POA: Diagnosis not present

## 2015-11-02 DIAGNOSIS — J209 Acute bronchitis, unspecified: Secondary | ICD-10-CM | POA: Diagnosis not present

## 2015-11-02 DIAGNOSIS — I10 Essential (primary) hypertension: Secondary | ICD-10-CM | POA: Diagnosis not present

## 2015-11-11 DIAGNOSIS — B9689 Other specified bacterial agents as the cause of diseases classified elsewhere: Secondary | ICD-10-CM | POA: Diagnosis not present

## 2015-11-11 DIAGNOSIS — F17210 Nicotine dependence, cigarettes, uncomplicated: Secondary | ICD-10-CM | POA: Diagnosis not present

## 2015-11-11 DIAGNOSIS — J019 Acute sinusitis, unspecified: Secondary | ICD-10-CM | POA: Diagnosis not present

## 2015-11-11 DIAGNOSIS — J208 Acute bronchitis due to other specified organisms: Secondary | ICD-10-CM | POA: Diagnosis not present

## 2015-11-17 ENCOUNTER — Emergency Department
Admission: EM | Admit: 2015-11-17 | Discharge: 2015-11-18 | Disposition: A | Discharge status: Home / Self Care | Payer: Medicare Other | Attending: Emergency Medicine | Admitting: Emergency Medicine

## 2015-11-17 ENCOUNTER — Encounter: Payer: Self-pay | Admitting: Emergency Medicine

## 2015-11-17 DIAGNOSIS — I10 Essential (primary) hypertension: Secondary | ICD-10-CM | POA: Diagnosis not present

## 2015-11-17 DIAGNOSIS — Z791 Long term (current) use of non-steroidal anti-inflammatories (NSAID): Secondary | ICD-10-CM | POA: Diagnosis not present

## 2015-11-17 DIAGNOSIS — F17200 Nicotine dependence, unspecified, uncomplicated: Secondary | ICD-10-CM | POA: Diagnosis not present

## 2015-11-17 DIAGNOSIS — F17290 Nicotine dependence, other tobacco product, uncomplicated: Secondary | ICD-10-CM | POA: Diagnosis not present

## 2015-11-17 DIAGNOSIS — R319 Hematuria, unspecified: Secondary | ICD-10-CM | POA: Insufficient documentation

## 2015-11-17 DIAGNOSIS — R109 Unspecified abdominal pain: Secondary | ICD-10-CM | POA: Insufficient documentation

## 2015-11-17 DIAGNOSIS — R110 Nausea: Secondary | ICD-10-CM | POA: Diagnosis not present

## 2015-11-17 DIAGNOSIS — Z79899 Other long term (current) drug therapy: Secondary | ICD-10-CM | POA: Insufficient documentation

## 2015-11-17 LAB — BASIC METABOLIC PANEL
ANION GAP: 9 (ref 5–15)
BUN: 6 mg/dL (ref 6–20)
CALCIUM: 9.7 mg/dL (ref 8.9–10.3)
CO2: 22 mmol/L (ref 22–32)
Chloride: 106 mmol/L (ref 101–111)
Creatinine, Ser: 0.92 mg/dL (ref 0.61–1.24)
GFR calc non Af Amer: >60 mL/min (ref >60)
GLUCOSE: 109 mg/dL — AB (ref 65–99)
Potassium: 3.3 mmol/L — ABNORMAL LOW (ref 3.5–5.1)
SODIUM: 137 mmol/L (ref 135–145)

## 2015-11-17 LAB — CBC WITH DIFFERENTIAL/PLATELET
BASOS ABS: 0.1 10*3/uL (ref 0–0.1)
BASOS PCT: 1 %
EOS ABS: 0.5 10*3/uL (ref 0–0.7)
EOS PCT: 4 %
HEMATOCRIT: 46.2 % (ref 40.0–52.0)
Hemoglobin: 16.3 g/dL (ref 13.0–18.0)
Lymphocytes Relative: 33 %
Lymphs Abs: 3.5 10*3/uL (ref 1.0–3.6)
MCH: 33.6 pg (ref 26.0–34.0)
MCHC: 35.2 g/dL (ref 32.0–36.0)
MCV: 95.5 fL (ref 80.0–100.0)
MONO ABS: 0.9 10*3/uL (ref 0.2–1.0)
Monocytes Relative: 9 %
NEUTROS ABS: 5.6 10*3/uL (ref 1.4–6.5)
Neutrophils Relative %: 53 %
PLATELETS: 267 10*3/uL (ref 150–440)
RBC: 4.84 MIL/uL (ref 4.40–5.90)
RDW: 12.6 % (ref 11.5–14.5)
WBC: 10.5 10*3/uL (ref 3.8–10.6)

## 2015-11-17 MED ORDER — LIDOCAINE HCL (CARDIAC) 20 MG/ML IV SOLN: 1.5000 mg/kg | Freq: Once | INTRAVENOUS | Status: DC

## 2015-11-17 MED ORDER — MORPHINE SULFATE (PF) 4 MG/ML IV SOLN
4.0000 mg | Freq: Once | INTRAVENOUS | Status: AC
  Administered 2015-11-17: 4 mg via INTRAVENOUS
  Filled 2015-11-17: qty 1

## 2015-11-17 MED ORDER — SODIUM CHLORIDE 0.9 % IV BOLUS (SEPSIS)
1000.0000 mL | Freq: Once | INTRAVENOUS | Status: AC
  Administered 2015-11-17: 1000 mL via INTRAVENOUS

## 2015-11-17 MED ORDER — ONDANSETRON HCL 4 MG/2ML IJ SOLN
4.0000 mg | Freq: Once | INTRAMUSCULAR | Status: AC
  Administered 2015-11-17: 4 mg via INTRAVENOUS
  Filled 2015-11-17: qty 2

## 2015-11-17 MED ORDER — KETOROLAC TROMETHAMINE 30 MG/ML IJ SOLN
15.0000 mg | Freq: Once | INTRAMUSCULAR | Status: AC
  Administered 2015-11-18: 15 mg via INTRAVENOUS
  Filled 2015-11-17: qty 1

## 2015-11-17 NOTE — ED Triage Notes (Signed)
Pt states left sided flank pain and groin pain with nausea and vomiting that started yesterday. Pt appears very uncomfortable.

## 2015-11-17 NOTE — ED Provider Notes (Signed)
[facility] Emergency Department Provider Note  ____________________________________________   First MD Initiated Contact with Patient 11/17/15 2341     (approximate)  I have reviewed the triage vital signs and the nursing notes.   HISTORY  Chief Complaint Flank Pain    HPI Jorge Nichols is a 25 y.o. male with a history of chronic flank pain and frequent ED visits for kidney stones and microscopic hematuria.He has had 10 emergency department visits in the last 6 months, typically for flank pain but also for a variety of other pain related issues such as dental pain.  He goes to various other [facility] emergency departments as well as to [facility].  He presents tonight with complaint of gradual onset left-sided flank pain similar to prior over the last couple of days.  It feels similar to prior.  He is still able to urinate and has not seen any gross blood.  He has had some nausea but no vomiting.  He denies fever/chills, chest pain, shortness of breath.  He describes the pain as severe, worse with movement, nothing makes it better nor worse.   Past Medical History:  Diagnosis Date  . Anxiety   . Chronic headaches   . Chronic hip pain   . Hyperlipidemia   . Hypertension   . Kidney stones   . Kidney stones   . Thyroid disease     Patient Active Problem List   Diagnosis Date Noted  . Vitamin D deficiency 06/21/2015  . Other hyperparathyroidism (HCC) 06/05/2015  . BP (high blood pressure) 05/13/2015  . Calculus of kidney 05/13/2015  . Arthralgia of hip 12/04/2013  . Iliopsoas bursitis 12/04/2013  . History of repair of hip joint 07/03/2013  . Avascular necrosis of bone (HCC) 03/20/2013  . Mixed dyslipidemia 09/09/2012  . Metabolic syndrome 09/09/2012  . Morbid obesity (HCC) 09/09/2012  . Sleep disorder breathing 09/09/2012  . Family history of premature CAD 09/09/2012    Past Surgical History:  Procedure Laterality Date  . JOINT  REPLACEMENT    . LITHOTRIPSY    . TOTAL HIP ARTHROPLASTY      Prior to Admission medications   Medication Sig Start Date End Date Taking? Authorizing Provider  ALPRAZolam Prudy Feeler) 0.5 MG tablet Take 0.5 mg by mouth at bedtime as needed for anxiety.     Historical Provider, MD  ibuprofen (ADVIL,MOTRIN) 800 MG tablet Take 1 tablet (800 mg total) by mouth 3 (three) times daily. Patient taking differently: Take 800 mg by mouth 3 (three) times daily as needed for mild pain.  03/17/15   Eber Hong, MD  metoprolol (LOPRESSOR) 50 MG tablet Take 50 mg by mouth 2 (two) times daily.    Historical Provider, MD  ondansetron (ZOFRAN ODT) 4 MG disintegrating tablet Allow 1-2 tablets to dissolve in your mouth every 8 hours as needed for nausea/vomiting 11/18/15   Loleta Rose, MD  potassium citrate (UROCIT-K) 10 MEQ (1080 MG) SR tablet Take 10 mEq by mouth 3 (three) times daily. 07/02/15   Historical Provider, MD  tamsulosin (FLOMAX) 0.4 MG CAPS capsule Take 1 tablet by mouth daily until you pass the kidney stone or no longer have symptoms 11/18/15   Loleta Rose, MD  Vitamin D, Ergocalciferol, (DRISDOL) 50000 units CAPS capsule Take 1 capsule (50,000 Units total) by mouth every 7 (seven) days. 10/23/15   Roma Kayser, MD    Allergies Review of patient's allergies indicates no known allergies.  Family History  Problem Relation  Age of Onset  . Hypertension Father     Social History Social History  Substance Use Topics  . Smoking status: Current Every Day Smoker    Packs/day: 0.50  . Smokeless tobacco: Current User    Types: Snuff  . Alcohol use 3.0 oz/week    5 Cans of beer per week     Comment: occassionally    Review of Systems Constitutional: No fever/chills Eyes: No visual changes. ENT: No sore throat. Cardiovascular: Denies chest pain. Respiratory: Denies shortness of breath. Gastrointestinal: Left Sided flank pain radiating down to the front of the abdomen.  Nausea, no vomiting.   No diarrhea.  No constipation. Genitourinary: Negative for dysuria. Musculoskeletal: Negative for back pain. Skin: Negative for rash. Neurological: Negative for headaches, focal weakness or numbness.  10-point ROS otherwise negative.  ____________________________________________   PHYSICAL EXAM:  VITAL SIGNS: ED Triage Vitals [11/17/15 2243]  Enc Vitals Group     BP (!) 218/114     Pulse Rate (!) 150     Resp (!) 22     Temp 99.7 F (37.6 C)     Temp Source Oral     SpO2 100 %     Weight 270 lb (122.5 kg)     Height 5\' 8"  (1.727 m)     Head Circumference      Peak Flow      Pain Score 9     Pain Loc      Pain Edu?      Excl. in GC?     Constitutional: Alert and oriented. Well appearing and in no acute distress. Eyes: Conjunctivae are normal. PERRL. EOMI. Head: Atraumatic. Nose: No congestion/rhinnorhea. Mouth/Throat: Mucous membranes are moist.  Oropharynx non-erythematous. Neck: No stridor.  No meningeal signs.   Cardiovascular: Normal rate, regular rhythm. Good peripheral circulation. Grossly normal heart sounds. Respiratory: Normal respiratory effort.  No retractions. Lungs CTAB. Gastrointestinal: Soft and nontender. No distention.  Musculoskeletal: No lower extremity tenderness nor edema. No gross deformities of extremities. Mild CVA tenderness on the left Neurologic:  Normal speech and language. No gross focal neurologic deficits are appreciated.  Skin:  Skin is warm, dry and intact. No rash noted. Psychiatric: Mood and affect are normal. Speech and behavior are normal.  ____________________________________________   LABS (all labs ordered are listed, but only abnormal results are displayed)  Labs Reviewed  BASIC METABOLIC PANEL - Abnormal; Notable for the following:       Result Value   Potassium 3.3 (*)    Glucose, Bld 109 (*)    All other components within normal limits  URINALYSIS COMPLETEWITH MICROSCOPIC ([facility] ONLY) - Abnormal; Notable for the  following:    Color, Urine STRAW (*)    APPearance CLEAR (*)    Specific Gravity, Urine 1.001 (*)    Hgb urine dipstick 3+ (*)    Bacteria, UA RARE (*)    Squamous Epithelial / LPF 0-5 (*)    All other components within normal limits  URINE CULTURE  CBC WITH DIFFERENTIAL/PLATELET   ____________________________________________  EKG  None - EKG not ordered by ED physician ____________________________________________  RADIOLOGY   No results found.  ____________________________________________   PROCEDURES  Procedure(s) performed:   Procedures   Critical Care performed: No ____________________________________________   INITIAL IMPRESSION / ASSESSMENT AND PLAN / ED COURSE  Pertinent labs & imaging results that were available during my care of the patient were reviewed by me and considered in my medical decision making (see chart for  details).  The patient has had numerous episodes of flank pain and kidney stones but has never required intervention.  The last several imaging studies obtained including CT renal study protocol has shown some stones in his kidneys but no ureteral stones.  I am concerned about the numerous prescriptions for narcotic medications that he has been prescribed in the last year (at least 13 not including his benzodiazepines).  He also just had a prescription for a quantity of 120 hydrocodone-chlorpheniramine prescribed 1 week ago.  I am concerned about the possibility of narcotic seeking behavior.  I explained to him that we would not treat with narcotics but I will give him a dose of Toradol 15 mg IV and I will also give him a dose of lidocaine 1.5 mg/kg IV and a small bag of normal saline administered over 15 minutes.  This is proven extremely effective for renal colic and that he can follow-up as an outpatient.  The patient states he understands and agrees with the plan.  Of note he has never required any surgical intervention for his kidney  stones.   Clinical Course  Value Comment By Time  Leukocytes, UA: NEGATIVE No evidence of infection in his urine no evidence of requiring antibiotics Loleta Roseory Makayle Krahn, MD 08/28 0057  Pulse Rate: (!) 150 The patient is tachycardic at triage but his tachycardia has slowed down significantly to around 100 at the time of my evaluation.  He remains hypertensive but that too has improved Loleta Roseory Keir Foland, MD 08/28 [phone]   Reassessed the patient, he states that he does not feel all that different after the lidocaine.  He appears to be in no acute distress.  He still has very slight tachycardia but there is no indication of an emergent medical condition at this time.  I splinted him that he should take the hydrocodone he was prescribed a week ago in follow-up at the next available opportunity with urology.  His blood pressures come down to reasonable limits.  I gave my usual customary return precautions. Loleta Roseory Nashalie Sallis, MD 08/28 0215    ____________________________________________  FINAL CLINICAL IMPRESSION(S) / ED DIAGNOSES  Final diagnoses:  Left flank pain  Hematuria     MEDICATIONS GIVEN DURING THIS VISIT:  Medications  morphine 4 MG/ML injection 4 mg (4 mg Intravenous Given 11/17/15 2335)  ondansetron (ZOFRAN) injection 4 mg (4 mg Intravenous Given 11/17/15 2334)  sodium chloride 0.9 % bolus 1,000 mL (0 mLs Intravenous Stopped 11/18/15 0012)  ketorolac (TORADOL) 30 MG/ML injection 15 mg (15 mg Intravenous Given 11/18/15 0056)  sodium chloride 0.9 % 250 mL with lidocaine (XYLOCAINE) 2 % 184 mg infusion ( Intravenous New Bag/Given 11/18/15 0113)     NEW OUTPATIENT MEDICATIONS STARTED DURING THIS VISIT:  New Prescriptions   ONDANSETRON (ZOFRAN ODT) 4 MG DISINTEGRATING TABLET    Allow 1-2 tablets to dissolve in your mouth every 8 hours as needed for nausea/vomiting   TAMSULOSIN (FLOMAX) 0.4 MG CAPS CAPSULE    Take 1 tablet by mouth daily until you pass the kidney stone or no longer have symptoms       Note:  This document was prepared using Dragon voice recognition software and may include unintentional dictation errors.    Loleta Roseory Foster Frericks, MD 11/18/15 [phone]

## 2015-11-18 LAB — URINALYSIS COMPLETE WITH MICROSCOPIC (ARMC ONLY)
BILIRUBIN URINE: NEGATIVE
Glucose, UA: NEGATIVE mg/dL
Ketones, ur: NEGATIVE mg/dL
LEUKOCYTES UA: NEGATIVE
NITRITE: NEGATIVE
PH: 6 (ref 5.0–8.0)
PROTEIN: NEGATIVE mg/dL
Specific Gravity, Urine: 1.001 — ABNORMAL LOW (ref 1.005–1.030)

## 2015-11-18 MED ORDER — TAMSULOSIN HCL 0.4 MG PO CAPS: ORAL_CAPSULE | ORAL | 0 refills | Status: DC

## 2015-11-18 MED ORDER — SODIUM CHLORIDE 0.9 % IV SOLN
Freq: Once | INTRAVENOUS | Status: AC
  Administered 2015-11-18: 01:00:00 via INTRAVENOUS
  Filled 2015-11-18: qty 250

## 2015-11-18 MED ORDER — ONDANSETRON 4 MG PO TBDP
ORAL_TABLET | ORAL | 0 refills | Status: DC
Start: 2015-11-18 — End: 2016-01-04

## 2015-11-18 NOTE — Discharge Instructions (Signed)
As we discussed, we understand that you continue to have pain consistent with kidney stones.  He is continue taking the pain medication that you are to have at home as well as over-the-counter pain medicine according to label instructions.  Follow-up with your urologist or with Dr. Apolinar JunesBrandon at the contact information listed above.  Drink plenty of fluids and take the prescribed Flomax which will help you urinate out a stone.  We cannot provide any additional prescription for narcotic pain medicine due to the large number of prescriptions you received in the last year but you can also use the prescribed nausea medicine as prescribed.   Return to the emergency department if you develop new or worsening symptoms that concern you.

## 2015-11-19 DIAGNOSIS — M9963 Osseous and subluxation stenosis of intervertebral foramina of lumbar region: Secondary | ICD-10-CM | POA: Diagnosis not present

## 2015-11-19 DIAGNOSIS — M5416 Radiculopathy, lumbar region: Secondary | ICD-10-CM | POA: Diagnosis not present

## 2015-11-19 LAB — URINE CULTURE: Culture: NO GROWTH

## 2015-11-22 DIAGNOSIS — M5416 Radiculopathy, lumbar region: Secondary | ICD-10-CM | POA: Diagnosis not present

## 2015-11-26 DIAGNOSIS — M545 Low back pain: Secondary | ICD-10-CM | POA: Diagnosis not present

## 2015-12-10 DIAGNOSIS — M5416 Radiculopathy, lumbar region: Secondary | ICD-10-CM | POA: Diagnosis not present

## 2015-12-10 DIAGNOSIS — M5136 Other intervertebral disc degeneration, lumbar region: Secondary | ICD-10-CM | POA: Diagnosis not present

## 2015-12-10 DIAGNOSIS — M4806 Spinal stenosis, lumbar region: Secondary | ICD-10-CM | POA: Diagnosis not present

## 2015-12-13 ENCOUNTER — Ambulatory Visit (HOSPITAL_COMMUNITY)
Admission: RE | Admit: 2015-12-13 | Discharge: 2015-12-13 | Disposition: A | Discharge status: Home / Self Care | Payer: Medicare Other | Source: Ambulatory Visit | Attending: Urology | Admitting: Urology

## 2015-12-13 ENCOUNTER — Other Ambulatory Visit: Payer: Self-pay | Admitting: Urology

## 2015-12-13 ENCOUNTER — Ambulatory Visit (INDEPENDENT_AMBULATORY_CARE_PROVIDER_SITE_OTHER): Payer: BLUE CROSS/BLUE SHIELD | Admitting: Urology

## 2015-12-13 DIAGNOSIS — N202 Calculus of kidney with calculus of ureter: Secondary | ICD-10-CM | POA: Diagnosis not present

## 2015-12-13 DIAGNOSIS — N200 Calculus of kidney: Secondary | ICD-10-CM

## 2015-12-13 DIAGNOSIS — N201 Calculus of ureter: Secondary | ICD-10-CM

## 2015-12-25 ENCOUNTER — Encounter (HOSPITAL_COMMUNITY): Payer: Self-pay | Admitting: Emergency Medicine

## 2015-12-25 ENCOUNTER — Emergency Department (HOSPITAL_COMMUNITY): Payer: Medicare Other

## 2015-12-25 ENCOUNTER — Emergency Department (HOSPITAL_COMMUNITY)
Admission: EM | Admit: 2015-12-25 | Discharge: 2015-12-25 | Disposition: A | Discharge status: Home / Self Care | Payer: Medicare Other | Attending: Emergency Medicine | Admitting: Emergency Medicine

## 2015-12-25 DIAGNOSIS — F17210 Nicotine dependence, cigarettes, uncomplicated: Secondary | ICD-10-CM | POA: Insufficient documentation

## 2015-12-25 DIAGNOSIS — Z79899 Other long term (current) drug therapy: Secondary | ICD-10-CM | POA: Diagnosis not present

## 2015-12-25 DIAGNOSIS — N200 Calculus of kidney: Secondary | ICD-10-CM | POA: Diagnosis not present

## 2015-12-25 DIAGNOSIS — R109 Unspecified abdominal pain: Secondary | ICD-10-CM | POA: Insufficient documentation

## 2015-12-25 DIAGNOSIS — I10 Essential (primary) hypertension: Secondary | ICD-10-CM | POA: Insufficient documentation

## 2015-12-25 DIAGNOSIS — R39198 Other difficulties with micturition: Secondary | ICD-10-CM | POA: Diagnosis not present

## 2015-12-25 LAB — CBC WITH DIFFERENTIAL/PLATELET
BASOS PCT: 1 %
Basophils Absolute: 0.1 10*3/uL (ref 0.0–0.1)
EOS ABS: 0.4 10*3/uL (ref 0.0–0.7)
EOS PCT: 4 %
HCT: 44.3 % (ref 39.0–52.0)
Hemoglobin: 15.2 g/dL (ref 13.0–17.0)
LYMPHS ABS: 2.8 10*3/uL (ref 0.7–4.0)
Lymphocytes Relative: 28 %
MCH: 33.5 pg (ref 26.0–34.0)
MCHC: 34.3 g/dL (ref 30.0–36.0)
MCV: 97.6 fL (ref 78.0–100.0)
MONO ABS: 1 10*3/uL (ref 0.1–1.0)
MONOS PCT: 10 %
Neutro Abs: 5.9 10*3/uL (ref 1.7–7.7)
Neutrophils Relative %: 57 %
Platelets: 285 10*3/uL (ref 150–400)
RBC: 4.54 MIL/uL (ref 4.22–5.81)
RDW: 12.6 % (ref 11.5–15.5)
WBC: 10.2 10*3/uL (ref 4.0–10.5)

## 2015-12-25 LAB — BASIC METABOLIC PANEL
Anion gap: 8 (ref 5–15)
BUN: 8 mg/dL (ref 6–20)
CALCIUM: 10 mg/dL (ref 8.9–10.3)
CO2: 27 mmol/L (ref 22–32)
CREATININE: 0.99 mg/dL (ref 0.61–1.24)
Chloride: 102 mmol/L (ref 101–111)
GFR calc non Af Amer: >60 mL/min (ref >60)
GLUCOSE: 78 mg/dL (ref 65–99)
Potassium: 3.2 mmol/L — ABNORMAL LOW (ref 3.5–5.1)
Sodium: 137 mmol/L (ref 135–145)

## 2015-12-25 LAB — URINALYSIS, ROUTINE W REFLEX MICROSCOPIC
BILIRUBIN URINE: NEGATIVE
GLUCOSE, UA: NEGATIVE mg/dL
KETONES UR: NEGATIVE mg/dL
Leukocytes, UA: NEGATIVE
NITRITE: NEGATIVE
PH: 5.5 (ref 5.0–8.0)
Protein, ur: NEGATIVE mg/dL
Specific Gravity, Urine: 1.015 (ref 1.005–1.030)

## 2015-12-25 LAB — URINE MICROSCOPIC-ADD ON

## 2015-12-25 MED ORDER — HYDROMORPHONE HCL 1 MG/ML IJ SOLN
1.0000 mg | Freq: Once | INTRAMUSCULAR | Status: AC
  Administered 2015-12-25: 1 mg via INTRAVENOUS
  Filled 2015-12-25: qty 1

## 2015-12-25 MED ORDER — OXYCODONE-ACETAMINOPHEN 5-325 MG PO TABS: 1.0000 | ORAL_TABLET | ORAL | 0 refills | Status: DC | PRN

## 2015-12-25 MED ORDER — ONDANSETRON HCL 4 MG PO TABS: 4.0000 mg | ORAL_TABLET | Freq: Four times a day (QID) | ORAL | 0 refills | Status: DC

## 2015-12-25 MED ORDER — KETOROLAC TROMETHAMINE 30 MG/ML IJ SOLN
30.0000 mg | Freq: Once | INTRAMUSCULAR | Status: AC
  Administered 2015-12-25: 30 mg via INTRAVENOUS
  Filled 2015-12-25: qty 1

## 2015-12-25 MED ORDER — ONDANSETRON HCL 4 MG/2ML IJ SOLN
4.0000 mg | Freq: Once | INTRAMUSCULAR | Status: AC
  Administered 2015-12-25: 4 mg via INTRAVENOUS
  Filled 2015-12-25: qty 2

## 2015-12-25 NOTE — Discharge Instructions (Signed)
Strain all urine.  Call the urologist listed to arrange a follow-up appt

## 2015-12-25 NOTE — ED Triage Notes (Signed)
Pt c/o right sided flank pain x 2 weeks. Pt states he has a known kidney stone but has been unable to pass it.

## 2015-12-30 ENCOUNTER — Other Ambulatory Visit: Payer: Self-pay | Admitting: Urology

## 2015-12-30 MED FILL — Oxycodone w/ Acetaminophen Tab 5-325 MG: ORAL | Qty: 6 | Status: AC

## 2015-12-30 NOTE — ED Provider Notes (Signed)
[facility]-EM[facility]ENCY DEPT Provider Note   CSN: [id number] Arrival date & time: 12/25/15  1548     History   Chief Complaint Chief Complaint  Patient presents with  . Flank Pain    HPI Jorge Nichols is a 25 y.o. male.  HPI   Jorge Nichols is a 25 y.o. male who presents to the Emergency Department complaining of right flank pain for 2 weeks.  He states that he was seen recently by his urologist, Dr. Annabell HowellsWrenn, and diagnosed with a kidney stone.  He states that he has been trying "to pass it" at home, but reports pain has been increasing and radiating to his right groin area  He reports nausea associated with the pain.  Pain has been waxing and waning but not resolved.  He has been straining his urine without a noticed stone passed.  He denies testicle pain or swelling, vomiting, fever.    Past Medical History:  Diagnosis Date  . Anxiety   . Chronic headaches   . Chronic hip pain   . Hyperlipidemia   . Hypertension   . Kidney stones   . Kidney stones   . Thyroid disease     Patient Active Problem List   Diagnosis Date Noted  . Vitamin D deficiency 06/21/2015  . Other hyperparathyroidism (HCC) 06/05/2015  . BP (high blood pressure) 05/13/2015  . Calculus of kidney 05/13/2015  . Arthralgia of hip 12/04/2013  . Iliopsoas bursitis 12/04/2013  . History of repair of hip joint 07/03/2013  . Avascular necrosis of bone (HCC) 03/20/2013  . Mixed dyslipidemia 09/09/2012  . Metabolic syndrome 09/09/2012  . Morbid obesity (HCC) 09/09/2012  . Sleep disorder breathing 09/09/2012  . Family history of premature CAD 09/09/2012    Past Surgical History:  Procedure Laterality Date  . JOINT REPLACEMENT    . LITHOTRIPSY    . TOTAL HIP ARTHROPLASTY         Home Medications    Prior to Admission medications   Medication Sig Start Date End Date Taking? Authorizing Provider  metoprolol (LOPRESSOR) 50 MG tablet Take 50 mg by mouth 2 (two) times daily.   Yes Historical Provider, MD    potassium citrate (UROCIT-K) 10 MEQ (1080 MG) SR tablet Take 10 mEq by mouth 3 (three) times daily. 07/02/15  Yes Historical Provider, MD  tamsulosin (FLOMAX) 0.4 MG CAPS capsule Take 1 tablet by mouth daily until you pass the kidney stone or no longer have symptoms 11/18/15  Yes Loleta Roseory Forbach, MD  Vitamin D, Ergocalciferol, (DRISDOL) 50000 units CAPS capsule Take 1 capsule (50,000 Units total) by mouth every 7 (seven) days. 10/23/15  Yes Roma KayserGebreselassie W Nida, MD  ondansetron (ZOFRAN ODT) 4 MG disintegrating tablet Allow 1-2 tablets to dissolve in your mouth every 8 hours as needed for nausea/vomiting Patient not taking: Reported on 12/25/2015 11/18/15   Loleta Roseory Forbach, MD  ondansetron (ZOFRAN) 4 MG tablet Take 1 tablet (4 mg total) by mouth every 6 (six) hours. 12/25/15   Zacheriah Stumpe, PA-C  oxyCODONE-acetaminophen (PERCOCET/ROXICET) 5-325 MG tablet Take 1 tablet by mouth every 4 (four) hours as needed. 12/25/15   Eddi Hymes, PA-C  oxyCODONE-acetaminophen (PERCOCET/ROXICET) 5-325 MG tablet Take 1 tablet by mouth every 4 (four) hours as needed. 12/25/15   Anastacio Bua, PA-C    Family History Family History  Problem Relation Age of Onset  . Hypertension Father     Social History Social History  Substance Use Topics  . Smoking status: Current Every Day Smoker  Packs/day: 0.50  . Smokeless tobacco: Current User    Types: Snuff  . Alcohol use 3.0 oz/week    5 Cans of beer per week     Comment: occassionally     Allergies   Review of patient's allergies indicates no known allergies.   Review of Systems Review of Systems  Constitutional: Negative for chills, fatigue and fever.  HENT: Negative for sore throat and trouble swallowing.   Respiratory: Negative for cough, shortness of breath and wheezing.   Cardiovascular: Negative for chest pain and palpitations.  Gastrointestinal: Negative for abdominal pain, blood in stool, nausea and vomiting.  Genitourinary: Positive for difficulty  urinating and flank pain. Negative for dysuria, hematuria, scrotal swelling and testicular pain.  Musculoskeletal: Negative for arthralgias, back pain, myalgias, neck pain and neck stiffness.  Skin: Negative for rash.  Neurological: Negative for dizziness, weakness and numbness.  Hematological: Does not bruise/bleed easily.     Physical Exam Updated Vital Signs BP 137/75   Pulse 105   Temp 97.3 F (36.3 C) (Temporal)   Resp 18   Ht 5\' 8"  (1.727 m)   Wt 122.5 kg   SpO2 99%   BMI 41.05 kg/m   Physical Exam  Constitutional: He is oriented to person, place, and time. He appears well-developed and well-nourished.  Pt is uncomfortable appearing  HENT:  Head: Normocephalic and atraumatic.  Eyes: EOM are normal. Pupils are equal, round, and reactive to light.  Cardiovascular: Normal rate and regular rhythm.   Pulmonary/Chest: Effort normal and breath sounds normal.  Abdominal: Soft. Normal appearance. There is no tenderness. There is no rebound, no guarding and no CVA tenderness.  Pt points to right flank as area of pain.  No CVA tenderness on exam.  Remaining abdomen is soft , NT  Musculoskeletal: Normal range of motion. He exhibits no tenderness.  Lymphadenopathy:    He has no cervical adenopathy.  Neurological: He is alert and oriented to person, place, and time.  Skin: Skin is warm and dry.  Psychiatric: Judgment normal.     ED Treatments / Results  Labs (all labs ordered are listed, but only abnormal results are displayed) Labs Reviewed  URINALYSIS, ROUTINE W REFLEX MICROSCOPIC (NOT AT [facility]) - Abnormal; Notable for the following:       Result Value   Hgb urine dipstick LARGE (*)    All other components within normal limits  BASIC METABOLIC PANEL - Abnormal; Notable for the following:    Potassium 3.2 (*)    All other components within normal limits  URINE MICROSCOPIC-ADD ON - Abnormal; Notable for the following:    Squamous Epithelial / LPF 0-5 (*)    Bacteria, UA  RARE (*)    All other components within normal limits  CBC WITH DIFFERENTIAL/PLATELET    EKG  EKG Interpretation None       Radiology Dg Abd 1 View  Result Date: 12/13/2015 CLINICAL DATA:  Bilateral renal calculi with left-sided pain EXAM: ABDOMEN - 1 VIEW COMPARISON:  08/10/2015 FINDINGS: Scattered large and small bowel gas is noted. Small 2-3 mm stone is noted in the midportion of the right kidney. The known left renal calculi are not as well appreciated on this exam. There is been migration of a 6-7 mm calcification in the midportion of the right ureter consistent with ureteral stone. No bony abnormality is noted. Bilateral hip replacements are noted. IMPRESSION: Right renal stones. The left renal stones are not well appreciated on this exam. Migration of a  stone into the right mid ureter. Electronically Signed   By: Alcide Clever M.D.   On: 12/13/2015 16:12   Ct Renal Stone Study  Result Date: 12/25/2015 CLINICAL DATA:  Right flank pain for 2 weeks with hematuria EXAM: CT ABDOMEN AND PELVIS WITHOUT CONTRAST TECHNIQUE: Multidetector CT imaging of the abdomen and pelvis was performed following the standard protocol without IV contrast. COMPARISON:  12/13/2015 FINDINGS: Lower chest: No acute abnormality. Hepatobiliary: The liver is diffusely fatty infiltrated. The gallbladder is within normal limits. Pancreas: Unremarkable. No pancreatic ductal dilatation or surrounding inflammatory changes. Spleen: Normal in size without focal abnormality. Adrenals/Urinary Tract: Kidneys are well visualized bilaterally. Bilateral renal calculi are noted. Largest of these measures 6 mm and lies in the lower pole of the right kidney. The previously seen calcification projecting in the region of the right proximal ureter is not well appreciated. No obstructive changes are seen. The bladder is partially distended. Stomach/Bowel: The appendix is within normal limits. No obstructive changes are seen. No significant  diverticular changes noted. Vascular/Lymphatic: No significant vascular findings are present. No enlarged abdominal or pelvic lymph nodes. Reproductive: Prostate is unremarkable. Other: No abdominal wall hernia or abnormality. No abdominopelvic ascites. Musculoskeletal: Bilateral hip replacements are noted. No other bony abnormality is seen. IMPRESSION: Bilateral nonobstructing renal calculi as described. Previously suggested proximal right ureteral stone is not identified and may have passed. Fatty liver. No other focal abnormality is seen. Electronically Signed   By: Alcide Clever M.D.   On: 12/25/2015 19:11    Procedures Procedures (including critical care time)  Medications Ordered in ED Medications  HYDROmorphone (DILAUDID) injection 1 mg (1 mg Intravenous Given 12/25/15 1817)  ondansetron (ZOFRAN) injection 4 mg (4 mg Intravenous Given 12/25/15 1817)  HYDROmorphone (DILAUDID) injection 1 mg (1 mg Intravenous Given 12/25/15 1957)  ketorolac (TORADOL) 30 MG/ML injection 30 mg (30 mg Intravenous Given 12/25/15 1957)     Initial Impression / Assessment and Plan / ED Course  I have reviewed the triage vital signs and the nursing notes.  Pertinent labs & imaging results that were available during my care of the patient were reviewed by me and considered in my medical decision making (see chart for details).  Clinical Course   Pt with a previously seen kidney stone on plain film, not visualized on CT.  Pt is feeling better after pain medications here.  Has urine strainer at home.  Appears stable for d/c.  Discussed possible retained stone that is not visualized on CT and importance of close urology f/u.  Pt agrees to plan.  Return precautions also given  Final Clinical Impressions(s) / ED Diagnoses   Final diagnoses:  Right flank pain    New Prescriptions Discharge Medication List as of 12/25/2015  8:41 PM    START taking these medications   Details  ondansetron (ZOFRAN) 4 MG tablet Take  1 tablet (4 mg total) by mouth every 6 (six) hours., Starting Wed 12/25/2015, Print    !! oxyCODONE-acetaminophen (PERCOCET/ROXICET) 5-325 MG tablet Take 1 tablet by mouth every 4 (four) hours as needed., Starting Wed 12/25/2015, Print    !! oxyCODONE-acetaminophen (PERCOCET/ROXICET) 5-325 MG tablet Take 1 tablet by mouth every 4 (four) hours as needed., Starting Wed 12/25/2015, Print     !! - Potential duplicate medications found. Please discuss with provider.       Pauline Aus, PA-C 12/30/15 1713    Bethann Berkshire, MD 12/30/15 2223

## 2015-12-31 ENCOUNTER — Encounter (HOSPITAL_BASED_OUTPATIENT_CLINIC_OR_DEPARTMENT_OTHER): Payer: Self-pay | Admitting: *Deleted

## 2016-01-01 ENCOUNTER — Encounter (HOSPITAL_BASED_OUTPATIENT_CLINIC_OR_DEPARTMENT_OTHER): Payer: Self-pay | Admitting: *Deleted

## 2016-01-01 NOTE — Progress Notes (Signed)
NPO AFTER MN, PT VERBALIZED UNDERSTANDING THIS INCLUDES NO DIP TOBACCO.  ARRIVE AT 1000.  NEEDS EKG.  CURRENT LAB RESULTS IN CHART AND EPIC.  WILL TAKE METOPROLOL AM DOS W/ SIPS OF WATER, IF NEEDED TAKE PAIN/ NAUSEA RX.

## 2016-01-01 NOTE — H&P (Signed)
CC: I have kidney stones.  HPI: Jorge Nichols is a 25 year-old male established patient who is here for renal calculi.  The problem is on the right side. He first stated noticing pain on approximately 12/11/2015. This is not his first kidney stone. He has had more than 5 stones prior to getting this one. He is currently having flank pain and groin pain. He denies having back pain, nausea, vomiting, fever, and chills.   Jill AlexandersJustin returns today in f/u. He has a 2 day history of right flank pain with radiation into the groin. The pain is moderate but can be severe. He has some nausea with the exacerbations. He has some hematuria. He remains on potassium citrate and has continued to pass small stones on occasion. KUB today shows a 3x645mm right proximal stone that was in the RLP in May.      ALLERGIES: No Allergies    MEDICATIONS: Metoprolol Tartrate TABS Oral  Potassium Citrate ER 10 MEQ (1080 MG) Oral Tablet Extended Release 0 Oral  TraMADol HCl - 50 MG Oral Tablet 0 Oral Every 6 hours  Xanax 0.5 MG Oral Tablet Oral     GU PSH: Renal ESWL - 05/10/2015      PSH Notes: Renal Lithotripsy, Hip Surgery   NON-GU PSH: None   GU PMH: Kidney Stone, Nephrolithiasis - 08/13/2015 Calculus Ureter, Ureteral calculus, right - 07/02/2015 Personal Hx urinary calculi, History of renal calculi - 05/10/2015    NON-GU PMH: Hyperparathyroidism, unspecified, Hyperparathyroidism - 06/14/2015 Hyperuricemia without signs of inflammatory arthritis and tophaceous disease, Asymptomatic hyperuricemia - 06/14/2015 Anxiety, Anxiety - 05/10/2015 Personal history of other diseases of the circulatory system, History of hypertension - 05/10/2015 Tachycardia, unspecified, Tachycardia - 05/10/2015 Encounter for general adult medical examination without abnormal findings, Encounter for preventive health examination    FAMILY HISTORY: blood clots - Runs In Family Familial hematuria - Runs In Family Kidney Stones - Runs In Family    SOCIAL HISTORY: None    Notes: Occupation, Alcohol use, Number of children, Caffeine use, Smokes cigarettes   REVIEW OF SYSTEMS:    GU Review Male:   Patient reports burning/ pain with urination, stream starts and stops, and have to strain to urinate . Patient denies frequent urination, hard to postpone urination, get up at night to urinate, leakage of urine, trouble starting your stream, erection problems, and penile pain.  Gastrointestinal (Upper):   Patient denies nausea, vomiting, and indigestion/ heartburn.  Gastrointestinal (Lower):   Patient denies diarrhea and constipation.  Constitutional:   Patient denies fever, night sweats, weight loss, and fatigue.  Skin:   Patient denies skin rash/ lesion and itching.  Eyes:   Patient denies blurred vision and double vision.  Ears/ Nose/ Throat:   Patient denies sore throat and sinus problems.  Hematologic/Lymphatic:   Patient denies swollen glands and easy bruising.  Cardiovascular:   Patient denies leg swelling and chest pains.  Respiratory:   Patient denies cough and shortness of breath.  Endocrine:   Patient denies excessive thirst.  Musculoskeletal:   Patient denies back pain and joint pain.  Neurological:   Patient denies headaches and dizziness.  Psychologic:   Patient denies depression and anxiety.   VITAL SIGNS:      12/13/2015 03:47 PM  BP 132/85 mmHg  Heart Rate 93 /min  Temperature 98.4 F / 37 C   PAST DATA REVIEWED:  Source Of History:  Patient  Urine Test Review:   Urinalysis  X-Ray Review: KUB: Reviewed Films. 3  x5 mm Right proximal stone.     PROCEDURES:          Urinalysis - 81003 Dipstick Dipstick Cont'd  Specimen: Voided Bilirubin: Neg  Color: Yellow Ketones: Neg  Appearance: Clear Blood: 3+  Specific Gravity: 1.020 Protein: Neg  pH: 6.0 Urobilinogen: 0.2  Glucose: Neg Nitrites: Neg    Leukocyte Esterase: Neg    ASSESSMENT:      ICD-10 Details  1 GU:   Calculus Ureter - N20.1 Right     PLAN:             Medications Refill Meds: Oxycodone-Acetaminophen 5-325 MG Oral Tablet 1-2 capsule PO PRN   #30  0 Refill(s)  Tamsulosin HCl - 0.4 MG Oral Capsule 1 capsule PO Daily   #90  3 Refill(s)            Orders         Schedule Labs: 1 Month - Urinalysis  X-Rays: 4 Months - KUB  Return Visit: 4-6 Weeks - Office Visit             Note: with KUB          Document Letter(s):  Created for Patient: Clinical Summary         Notes:   He has a 3x27mm right proximal stone with pain and hematuria.   He has passed larger stones.   I have refilled the oxycodone and tamsulosin and the Mabeline Caras is current.   F/U in 4-6 weeks with a KUB.    He had a KUB that didn't show the stone so a CT was done which showed the stone in the right mid ureter.    He has elected ureteroscopy for the right ureteral stone and the risks were reviewed in detail.  I will get another KUB the day of surgery.

## 2016-01-02 ENCOUNTER — Encounter (HOSPITAL_BASED_OUTPATIENT_CLINIC_OR_DEPARTMENT_OTHER)
Admission: RE | Disposition: A | Discharge status: Home / Self Care | Payer: Self-pay | Source: Ambulatory Visit | Attending: Urology

## 2016-01-02 ENCOUNTER — Ambulatory Visit (HOSPITAL_COMMUNITY): Payer: Medicare Other

## 2016-01-02 ENCOUNTER — Ambulatory Visit (HOSPITAL_BASED_OUTPATIENT_CLINIC_OR_DEPARTMENT_OTHER)
Admission: RE | Admit: 2016-01-02 | Discharge: 2016-01-02 | Disposition: A | Discharge status: Home / Self Care | Payer: Medicare Other | Source: Ambulatory Visit | Attending: Urology | Admitting: Urology

## 2016-01-02 ENCOUNTER — Ambulatory Visit (HOSPITAL_BASED_OUTPATIENT_CLINIC_OR_DEPARTMENT_OTHER): Payer: Medicare Other | Admitting: Anesthesiology

## 2016-01-02 ENCOUNTER — Encounter (HOSPITAL_BASED_OUTPATIENT_CLINIC_OR_DEPARTMENT_OTHER): Payer: Self-pay

## 2016-01-02 DIAGNOSIS — I10 Essential (primary) hypertension: Secondary | ICD-10-CM | POA: Insufficient documentation

## 2016-01-02 DIAGNOSIS — Z6841 Body Mass Index (BMI) 40.0 and over, adult: Secondary | ICD-10-CM | POA: Insufficient documentation

## 2016-01-02 DIAGNOSIS — F419 Anxiety disorder, unspecified: Secondary | ICD-10-CM | POA: Insufficient documentation

## 2016-01-02 DIAGNOSIS — I129 Hypertensive chronic kidney disease with stage 1 through stage 4 chronic kidney disease, or unspecified chronic kidney disease: Secondary | ICD-10-CM | POA: Diagnosis not present

## 2016-01-02 DIAGNOSIS — N200 Calculus of kidney: Secondary | ICD-10-CM | POA: Diagnosis not present

## 2016-01-02 DIAGNOSIS — N202 Calculus of kidney with calculus of ureter: Secondary | ICD-10-CM | POA: Diagnosis not present

## 2016-01-02 DIAGNOSIS — R51 Headache: Secondary | ICD-10-CM | POA: Diagnosis not present

## 2016-01-02 DIAGNOSIS — N201 Calculus of ureter: Secondary | ICD-10-CM | POA: Diagnosis not present

## 2016-01-02 DIAGNOSIS — Z01818 Encounter for other preprocedural examination: Secondary | ICD-10-CM | POA: Diagnosis not present

## 2016-01-02 HISTORY — DX: Calculus of ureter: N20.1

## 2016-01-02 HISTORY — PX: CYSTOSCOPY WITH URETEROSCOPY, STONE BASKETRY AND STENT PLACEMENT: SHX6378

## 2016-01-02 HISTORY — DX: Calculus of kidney: N20.0

## 2016-01-02 HISTORY — DX: Vitamin D deficiency, unspecified: E55.9

## 2016-01-02 HISTORY — PX: HOLMIUM LASER APPLICATION: SHX5852

## 2016-01-02 HISTORY — DX: Other hyperparathyroidism: E21.2

## 2016-01-02 HISTORY — DX: Mixed hyperlipidemia: E78.2

## 2016-01-02 HISTORY — DX: Poor urinary stream: R39.12

## 2016-01-02 HISTORY — DX: Hematuria, unspecified: R31.9

## 2016-01-02 HISTORY — DX: Personal history of other diseases of the musculoskeletal system and connective tissue: Z87.39

## 2016-01-02 SURGERY — CYSTOSCOPY, WITH CALCULUS MANIPULATION OR REMOVAL
Anesthesia: General | Site: Ureter | Laterality: Right

## 2016-01-02 MED ORDER — SODIUM CHLORIDE 0.9 % IV SOLN
250.0000 mL | INTRAVENOUS | Status: DC | PRN
  Filled 2016-01-02: qty 250

## 2016-01-02 MED ORDER — MIDAZOLAM HCL 2 MG/2ML IJ SOLN
INTRAMUSCULAR | Status: AC
Start: 2016-01-02 — End: 2016-01-02
  Filled 2016-01-02: qty 2

## 2016-01-02 MED ORDER — PROPOFOL 10 MG/ML IV BOLUS
INTRAVENOUS | Status: DC | PRN
  Administered 2016-01-02: 50 mg via INTRAVENOUS
  Administered 2016-01-02: 300 mg via INTRAVENOUS
  Administered 2016-01-02: 50 mg via INTRAVENOUS

## 2016-01-02 MED ORDER — SODIUM CHLORIDE 0.9% FLUSH
3.0000 mL | INTRAVENOUS | Status: DC | PRN
  Filled 2016-01-02: qty 3

## 2016-01-02 MED ORDER — DEXAMETHASONE SODIUM PHOSPHATE 4 MG/ML IJ SOLN
INTRAMUSCULAR | Status: DC | PRN
  Administered 2016-01-02: 10 mg via INTRAVENOUS

## 2016-01-02 MED ORDER — LIDOCAINE 2% (20 MG/ML) 5 ML SYRINGE
INTRAMUSCULAR | Status: DC | PRN
  Administered 2016-01-02: 100 mg via INTRAVENOUS

## 2016-01-02 MED ORDER — METOCLOPRAMIDE HCL 5 MG/ML IJ SOLN
INTRAMUSCULAR | Status: DC | PRN
  Administered 2016-01-02: 10 mg via INTRAVENOUS

## 2016-01-02 MED ORDER — ONDANSETRON HCL 4 MG/2ML IJ SOLN
INTRAMUSCULAR | Status: DC | PRN
  Administered 2016-01-02: 4 mg via INTRAVENOUS

## 2016-01-02 MED ORDER — OXYCODONE HCL 5 MG PO TABS
ORAL_TABLET | ORAL | Status: AC
  Filled 2016-01-02: qty 1

## 2016-01-02 MED ORDER — ACETAMINOPHEN 650 MG RE SUPP
650.0000 mg | RECTAL | Status: DC | PRN
  Filled 2016-01-02: qty 1

## 2016-01-02 MED ORDER — FENTANYL CITRATE (PF) 100 MCG/2ML IJ SOLN
INTRAMUSCULAR | Status: AC
  Filled 2016-01-02: qty 2

## 2016-01-02 MED ORDER — BELLADONNA ALKALOIDS-OPIUM 16.2-60 MG RE SUPP
RECTAL | Status: DC | PRN
  Administered 2016-01-02: 1 via RECTAL

## 2016-01-02 MED ORDER — SODIUM CHLORIDE 0.9% FLUSH
3.0000 mL | Freq: Two times a day (BID) | INTRAVENOUS | Status: DC
  Filled 2016-01-02: qty 3

## 2016-01-02 MED ORDER — OXYCODONE HCL 5 MG PO TABS
5.0000 mg | ORAL_TABLET | ORAL | Status: DC | PRN
  Administered 2016-01-02: 5 mg via ORAL
  Filled 2016-01-02: qty 2

## 2016-01-02 MED ORDER — LACTATED RINGERS IV SOLN
INTRAVENOUS | Status: DC
  Administered 2016-01-02 (×2): via INTRAVENOUS
  Filled 2016-01-02: qty 1000

## 2016-01-02 MED ORDER — PHENAZOPYRIDINE HCL 200 MG PO TABS: 200.0000 mg | ORAL_TABLET | Freq: Three times a day (TID) | ORAL | 1 refills | Status: DC | PRN

## 2016-01-02 MED ORDER — METOCLOPRAMIDE HCL 5 MG/ML IJ SOLN
INTRAMUSCULAR | Status: AC
  Filled 2016-01-02: qty 2

## 2016-01-02 MED ORDER — SODIUM CHLORIDE 0.9 % IR SOLN
Status: DC | PRN
  Administered 2016-01-02: 3000 mL via INTRAVESICAL

## 2016-01-02 MED ORDER — FENTANYL CITRATE (PF) 100 MCG/2ML IJ SOLN
25.0000 ug | INTRAMUSCULAR | Status: DC | PRN
  Filled 2016-01-02: qty 1

## 2016-01-02 MED ORDER — METOPROLOL TARTRATE 5 MG/5ML IV SOLN
INTRAVENOUS | Status: DC | PRN
  Administered 2016-01-02: 1 mg via INTRAVENOUS

## 2016-01-02 MED ORDER — METOPROLOL TARTRATE 5 MG/5ML IV SOLN
INTRAVENOUS | Status: AC
  Filled 2016-01-02: qty 5

## 2016-01-02 MED ORDER — CEFAZOLIN IN D5W 1 GM/50ML IV SOLN
INTRAVENOUS | Status: AC
  Filled 2016-01-02: qty 50

## 2016-01-02 MED ORDER — FENTANYL CITRATE (PF) 100 MCG/2ML IJ SOLN
50.0000 ug | Freq: Once | INTRAMUSCULAR | Status: AC
  Administered 2016-01-02: 50 ug via INTRAVENOUS
  Filled 2016-01-02: qty 1

## 2016-01-02 MED ORDER — PROPOFOL 10 MG/ML IV BOLUS
INTRAVENOUS | Status: AC
  Filled 2016-01-02: qty 40

## 2016-01-02 MED ORDER — CEFAZOLIN SODIUM-DEXTROSE 2-4 GM/100ML-% IV SOLN
INTRAVENOUS | Status: AC
  Filled 2016-01-02: qty 100

## 2016-01-02 MED ORDER — MIDAZOLAM HCL 5 MG/5ML IJ SOLN
INTRAMUSCULAR | Status: DC | PRN
  Administered 2016-01-02: 2 mg via INTRAVENOUS

## 2016-01-02 MED ORDER — OXYCODONE-ACETAMINOPHEN 5-325 MG PO TABS
1.0000 | ORAL_TABLET | ORAL | 0 refills | Status: DC | PRN
Start: 2016-01-02 — End: 2016-02-03

## 2016-01-02 MED ORDER — DEXTROSE 5 % IV SOLN
3.0000 g | INTRAVENOUS | Status: AC
  Administered 2016-01-02: 3 g via INTRAVENOUS
  Filled 2016-01-02: qty 3000

## 2016-01-02 MED ORDER — PROMETHAZINE HCL 25 MG/ML IJ SOLN
6.2500 mg | INTRAMUSCULAR | Status: DC | PRN
Start: 2016-01-02 — End: 2016-01-02
  Filled 2016-01-02: qty 1

## 2016-01-02 MED ORDER — FENTANYL CITRATE (PF) 100 MCG/2ML IJ SOLN
INTRAMUSCULAR | Status: DC | PRN
  Administered 2016-01-02 (×3): 50 ug via INTRAVENOUS
  Administered 2016-01-02: 100 ug via INTRAVENOUS
  Administered 2016-01-02: 50 ug via INTRAVENOUS

## 2016-01-02 MED ORDER — ACETAMINOPHEN 325 MG PO TABS
650.0000 mg | ORAL_TABLET | ORAL | Status: DC | PRN
  Filled 2016-01-02: qty 2

## 2016-01-02 MED ORDER — CEFAZOLIN SODIUM-DEXTROSE 2-4 GM/100ML-% IV SOLN
2.0000 g | INTRAVENOUS | Status: DC
  Filled 2016-01-02: qty 100

## 2016-01-02 MED ORDER — LIDOCAINE 2% (20 MG/ML) 5 ML SYRINGE
INTRAMUSCULAR | Status: AC
  Filled 2016-01-02: qty 5

## 2016-01-02 MED ORDER — BELLADONNA ALKALOIDS-OPIUM 16.2-60 MG RE SUPP
RECTAL | Status: AC
  Filled 2016-01-02: qty 1

## 2016-01-02 MED ORDER — IOHEXOL 300 MG/ML  SOLN
INTRAMUSCULAR | Status: DC | PRN
  Administered 2016-01-02: 20 mL

## 2016-01-02 SURGICAL SUPPLY — 35 items
BAG DRAIN URO-CYSTO SKYTR STRL (DRAIN) ×2 IMPLANT
BAG DRN UROCATH (DRAIN) ×1
BASKET STONE 1.7 NGAGE (UROLOGICAL SUPPLIES) ×2 IMPLANT
BASKET STONE NCOMPASS (UROLOGICAL SUPPLIES) ×1 IMPLANT
BASKET ZERO TIP NITINOL 2.4FR (BASKET) IMPLANT
BSKT STON RTRVL ZERO TP 2.4FR (BASKET)
CATH URET 5FR 28IN CONE TIP (BALLOONS)
CATH URET 5FR 28IN OPEN ENDED (CATHETERS) ×1 IMPLANT
CATH URET 5FR 70CM CONE TIP (BALLOONS) IMPLANT
CLOTH BEACON ORANGE TIMEOUT ST (SAFETY) ×2 IMPLANT
ELECT REM PT RETURN 9FT ADLT (ELECTROSURGICAL)
ELECTRODE REM PT RTRN 9FT ADLT (ELECTROSURGICAL) IMPLANT
FIBER LASER FLEXIVA 1000 (UROLOGICAL SUPPLIES) IMPLANT
FIBER LASER FLEXIVA 365 (UROLOGICAL SUPPLIES) IMPLANT
FIBER LASER FLEXIVA 550 (UROLOGICAL SUPPLIES) IMPLANT
FIBER LASER TRAC TIP (UROLOGICAL SUPPLIES) ×1 IMPLANT
GLOVE SURG SS PI 8.0 STRL IVOR (GLOVE) ×2 IMPLANT
GOWN STRL REUS W/ TWL LRG LVL3 (GOWN DISPOSABLE) ×1 IMPLANT
GOWN STRL REUS W/ TWL XL LVL3 (GOWN DISPOSABLE) ×1 IMPLANT
GOWN STRL REUS W/TWL LRG LVL3 (GOWN DISPOSABLE) ×4
GOWN STRL REUS W/TWL XL LVL3 (GOWN DISPOSABLE) ×2
GUIDEWIRE 0.038 PTFE COATED (WIRE) IMPLANT
GUIDEWIRE ANG ZIPWIRE 038X150 (WIRE) IMPLANT
GUIDEWIRE STR DUAL SENSOR (WIRE) ×2 IMPLANT
IV NS IRRIG 3000ML ARTHROMATIC (IV SOLUTION) ×3 IMPLANT
KIT BALLIN UROMAX 15FX10 (LABEL) IMPLANT
KIT BALLN UROMAX 15FX4 (MISCELLANEOUS) IMPLANT
KIT BALLN UROMAX 26 75X4 (MISCELLANEOUS)
KIT ROOM TURNOVER WOR (KITS) ×2 IMPLANT
MANIFOLD NEPTUNE II (INSTRUMENTS) ×1 IMPLANT
PACK CYSTO (CUSTOM PROCEDURE TRAY) ×2 IMPLANT
SET HIGH PRES BAL DIL (LABEL)
SHEATH ACCESS URETERAL 38CM (SHEATH) ×1 IMPLANT
STENT URET 6FRX26 CONTOUR (STENTS) ×1 IMPLANT
TUBE CONNECTING 12X1/4 (SUCTIONS) ×1 IMPLANT

## 2016-01-02 NOTE — Anesthesia Procedure Notes (Signed)
Procedure Name: LMA Insertion Date/Time: 01/02/2016 11:50 AM Performed by: Maris BergerENENNY, Easter Kennebrew T Pre-anesthesia Checklist: Patient identified, Emergency Drugs available, Suction available and Patient being monitored Patient Re-evaluated:Patient Re-evaluated prior to inductionOxygen Delivery Method: Circle System Utilized Preoxygenation: Pre-oxygenation with 100% oxygen Intubation Type: IV induction Ventilation: Mask ventilation without difficulty LMA: LMA with gastric port inserted LMA Size: 5.0 Number of attempts: 1 Placement Confirmation: positive ETCO2 Tube secured with: Tape Dental Injury: Teeth and Oropharynx as per pre-operative assessment

## 2016-01-02 NOTE — Discharge Instructions (Addendum)
CYSTOSCOPY HOME CARE INSTRUCTIONS  Activity: Rest for the remainder of the day.  Do not drive or operate equipment today.  You may resume normal activities in one to two days as instructed by your physician.   Meals: Drink plenty of liquids and eat light foods such as gelatin or soup this evening.  You may return to a normal meal plan tomorrow.  Return to Work: You may return to work in one to two days or as instructed by your physician.  Special Instructions / Symptoms: Call your physician if any of these symptoms occur:   -persistent or heavy bleeding  -bleeding which continues after first few urination  -large blood clots that are difficult to pass  -urine stream diminishes or stops completely  -fever equal to or higher than 101 degrees Farenheit.  -cloudy urine with a strong, foul odor  -severe pain  Females should always wipe from front to back after elimination.  You may feel some burning pain when you urinate.  This should disappear with time.  Applying moist heat to the lower abdomen or a hot tub bath may help relieve the pain. \  You may remove the stent on Monday morning by pulling the attached string.   If you don't feel comfortable with that, call the office to arrange removal.   Patient Signature:  ________________________________________________________  Nurse's Signature:  ________________________________________________________  Post Anesthesia Home Care Instructions  Activity: Get plenty of rest for the remainder of the day. A responsible adult should stay with you for 24 hours following the procedure.  For the next 24 hours, DO NOT: -Drive a car -Advertising copywriterperate machinery -Drink alcoholic beverages -Take any medication unless instructed by your physician -Make any legal decisions or sign important papers.  Meals: Start with liquid foods such as gelatin or soup. Progress to regular foods as tolerated. Avoid greasy, spicy, heavy foods. If nausea and/or vomiting  occur, drink only clear liquids until the nausea and/or vomiting subsides. Call your physician if vomiting continues.  Special Instructions/Symptoms: Your throat may feel dry or sore from the anesthesia or the breathing tube placed in your throat during surgery. If this causes discomfort, gargle with warm salt water. The discomfort should disappear within 24 hours.  If you had a scopolamine patch placed behind your ear for the management of post- operative nausea and/or vomiting:  1. The medication in the patch is effective for 72 hours, after which it should be removed.  Wrap patch in a tissue and discard in the trash. Wash hands thoroughly with soap and water. 2. You may remove the patch earlier than 72 hours if you experience unpleasant side effects which may include dry mouth, dizziness or visual disturbances. 3. Avoid touching the patch. Wash your hands with soap and water after contact with the patch.

## 2016-01-02 NOTE — Anesthesia Preprocedure Evaluation (Addendum)
Anesthesia Evaluation  Patient identified by MRN, date of birth, ID band Patient awake    Reviewed: Allergy & Precautions, NPO status , Patient's Chart, lab work & pertinent test results  Airway Mallampati: II  TM Distance: >3 FB Neck ROM: Full    Dental no notable dental hx.    Pulmonary neg pulmonary ROS, Current Smoker,    Pulmonary exam normal breath sounds clear to auscultation       Cardiovascular Exercise Tolerance: Good hypertension, Pt. on medications and Pt. on home beta blockers Normal cardiovascular exam Rhythm:Regular Rate:Normal     Neuro/Psych  Headaches, Anxiety negative neurological ROS  negative psych ROS   GI/Hepatic negative GI ROS, Neg liver ROS,   Endo/Other  Morbid obesity  Renal/GU Renal disease  negative genitourinary   Musculoskeletal negative musculoskeletal ROS (+)   Abdominal (+) + obese,   Peds negative pediatric ROS (+)  Hematology negative hematology ROS (+)   Anesthesia Other Findings   Reproductive/Obstetrics negative OB ROS                            Anesthesia Physical Anesthesia Plan  ASA: III  Anesthesia Plan: General   Post-op Pain Management:    Induction: Intravenous  Airway Management Planned: LMA  Additional Equipment:   Intra-op Plan:   Post-operative Plan: Extubation in OR  Informed Consent: I have reviewed the patients History and Physical, chart, labs and discussed the procedure including the risks, benefits and alternatives for the proposed anesthesia with the patient or authorized representative who has indicated his/her understanding and acceptance.   Dental advisory given  Plan Discussed with: CRNA  Anesthesia Plan Comments:         Anesthesia Quick Evaluation

## 2016-01-02 NOTE — Interval H&P Note (Signed)
History and Physical Interval Note:  The stone is not readily apparent in the right ureter today but he has continued pain.  01/02/2016 11:47 AM  Salome ArntJustin T Schiffer  has presented today for surgery, with the diagnosis of RIGHT URETERAL STONE  The various methods of treatment have been discussed with the patient and family. After consideration of risks, benefits and other options for treatment, the patient has consented to  Procedure(s): CYSTOSCOPY WITH RIGHT URETEROSCOPY, STONE EXTRACTION  AND POSSIBLE STENT (Right) as a surgical intervention .  The patient's history has been reviewed, patient examined, no change in status, stable for surgery.  I have reviewed the patient's chart and labs.  Questions were answered to the patient's satisfaction.     Keisuke Hollabaugh J

## 2016-01-02 NOTE — Anesthesia Postprocedure Evaluation (Signed)
Anesthesia Post Note  Patient: Jorge Nichols  Procedure(s) Performed: Procedure(s) (LRB): CYSTOSCOPY WITH RIGHT URETEROSCOPY, STONE EXTRACTION  AND STENT PLACEMENT (Right) HOLMIUM LASER APPLICATION (Right)  Patient location during evaluation: PACU Anesthesia Type: General Level of consciousness: awake and alert and patient cooperative Pain management: pain level controlled Vital Signs Assessment: post-procedure vital signs reviewed and stable Respiratory status: spontaneous breathing and respiratory function stable Cardiovascular status: stable Anesthetic complications: no    Last Vitals:  Vitals:   01/02/16 1300 01/02/16 1315  BP: (!) 153/93 (!) 168/88  Pulse: 100 91  Resp: 15 18  Temp:      Last Pain:  Vitals:   01/02/16 1350  TempSrc:   PainSc: 9                  Saurav Crumble S

## 2016-01-02 NOTE — Brief Op Note (Signed)
01/02/2016  12:44 PM  PATIENT:  Jorge Nichols  25 y.o. male  PRE-OPERATIVE DIAGNOSIS:  RIGHT URETERAL and RENAL STONE  POST-OPERATIVE DIAGNOSIS:  RIGHT RENAL STONES  PROCEDURE:  Procedure(s): CYSTOSCOPY WITH RIGHT URETEROSCOPY, STONE EXTRACTION  AND STENT (Right) HOLMIUM LASER APPLICATION (Right)  SURGEON:  Surgeon(s) and Role:    * Bjorn PippinJohn Maziah Keeling, MD - Primary  PHYSICIAN ASSISTANT:   ASSISTANTS: none   ANESTHESIA:   general  EBL:  Total I/O In: 850 [I.V.:850] Out: -   BLOOD ADMINISTERED:none  DRAINS: 6 x 26 right JJ stent   LOCAL MEDICATIONS USED:  NONE  SPECIMEN:  Source of Specimen:  renal stones  DISPOSITION OF SPECIMEN:  to patient  COUNTS:  YES  TOURNIQUET:  * No tourniquets in log *  DICTATION: .Other Dictation: Dictation Number [id number]  PLAN OF CARE: Discharge to home after PACU  PATIENT DISPOSITION:  PACU - hemodynamically stable.   Delay start of Pharmacological VTE agent (>24hrs) due to surgical blood loss or risk of bleeding: not applicable

## 2016-01-02 NOTE — Transfer of Care (Signed)
Immediate Anesthesia Transfer of Care Note  Patient: Jorge Nichols  Procedure(s) Performed: Procedure(s): CYSTOSCOPY WITH RIGHT URETEROSCOPY, STONE EXTRACTION  AND STENT PLACEMENT (Right) HOLMIUM LASER APPLICATION (Right)  Patient Location: PACU  Anesthesia Type:General  Level of Consciousness: awake, alert  and oriented  Airway & Oxygen Therapy: Patient Spontanous Breathing and Patient connected to nasal cannula oxygen  Post-op Assessment: Report given to RN  Post vital signs: Reviewed and stable  Last Vitals: 163/93, 97, 14, 100%,   Vitals:   01/02/16 1021  BP: (!) 181/85  Pulse: 97  Resp: 18  Temp: 36.9 C    Last Pain:  Vitals:   01/02/16 1141  TempSrc:   PainSc: 7       Patients Stated Pain Goal: 7 (01/02/16 1054)  Complications: No apparent anesthesia complications

## 2016-01-03 ENCOUNTER — Encounter (HOSPITAL_BASED_OUTPATIENT_CLINIC_OR_DEPARTMENT_OTHER): Payer: Self-pay | Admitting: Urology

## 2016-01-03 NOTE — Op Note (Signed)
NAME:  Jorge Nichols, Jorge Nichols                     ACCOUNT NO.:  MEDICAL RECORD NO.:  [id number]  LOCATION:                                 FACILITY:  PHYSICIAN:  Excell SeltzerJohn J. Annabell HowellsWrenn, M.D.    DATE OF BIRTH:  07-14-1990  DATE OF PROCEDURE:  01/02/2016 DATE OF DISCHARGE:                              OPERATIVE REPORT   PROCEDURES: 1. Cystoscopy with bilateral retrograde pyelograms and interpretation. 2. Right ureteroscopic stone extraction with holmium lasertripsy and     insertion of right double-J stent.  PREOPERATIVE DIAGNOSIS:  Right ureteral and renal stones.  POSTOPERATIVE DIAGNOSIS:  Right renal stones.  SURGEON:  Excell SeltzerJohn J. Annabell HowellsWrenn, M.D.  ANESTHESIA:  General.  SPECIMEN:  Stones from the right kidney.  DRAINS:  A 6-French x 26-cm Contour double-J stent.  BLOOD LOSS:  Minimal.  COMPLICATIONS:  None.  INDICATIONS:  Jorge Nichols is a 25 year old white male with a history of recurrent urolithiasis, who was recently in late September, found to have about a 5-mm right proximal stone.  He has had persistent pain.  A CT scan on October 4th, did show an obvious stone or obstruction, but there was beam hardening in the pelvis and the UVJ was not well seen.  A KUB today also showed stones in the kidneys bilaterally, but no obvious ureteral stone.  However, he was still having right flank pain, so it was felt that intervention was still indicated.  FINDINGS AND PROCEDURE:  He was taken to the operating room where general anesthetic was induced.  He was placed in lithotomy position. He was fitted with PAS hose.  He was prepped with Betadine solution and draped in usual sterile fashion.  He had been given Ancef for perioperative antibiotic coverage.  Cystoscopy was performed using the 23-French scope and 30-degree lens. Examination revealed the normal urethra.  The external sphincter was intact.  The prostate was short without obstruction.  Examination of the bladder revealed a smooth wall.  No tumors  or stones were noted.  The ureteral orifices were unremarkable.  The right ureteral orifice was cannulated and contrast was instilled. This revealed no filling defects in the ureter or significant obstruction.  Because he was still having pain, I felt the left retrograde pyelogram was indicated to make sure he did not have a distal left stone, which can sometimes cause pain, it was difficult to localize.  Left retrograde pyelogram was unremarkable as well with normal ureter and intrarenal collecting system.  Once the retrograde pyelograms were performed since the patient had known right ureteral stones and had been having pain, it was felt that ureteroscopy was indicated.  A guidewire was passed to the kidney without difficulty.  A 12-French 38-cm 12/14 digital access sheath was then inserted initially.  The inner-core was passed to dilate the ureter.  This passed easily to the proximal ureter.  Then, the assembled sheath was inserted to approximately the midureter.  The sheath and wire were then removed.  Inspection of the ureter, proximal sheath was unremarkable.  The kidney was then inspected.  He was found to have multiple areas of Randall's plaques and an approximately 5-mm stone in the  lower pole and about a 6- 7 mm stone in the upper pole that were actually not attached to the Randall's plaques.  Initially, I had not clearly seen the loose stones, so several of the larger calcifications adherent to the Randall's plaques were removed using an NGage basket.  I then identified the 5-mm stone in the lower pole and was able to engage this with an NGage basket and removed it intact.  The larger stone that is a 6-7 mm in the upper pole was then identified on further inspection and engaged with an NGage basket.  An initial attempt to remove it intact was unsuccessful due to stone size.  I then cut the handle off the basket, removed the ureteroscope off the basket, then reinserted  the ureteroscope alongside the basket.  I was then able to engage the stone with a 200-micron TracTip laser fiber set on 1 watt and 20 hertz.  The stone was then broken into manageable fragments.  The entrapped basket was then removed and a new basket was passed through the scope and the residual fragments were then removed.  Final inspection revealed some dust in the area of fragmentation, but no significant large fragments.  None of the other Randall's plaques were sufficiently proud of the surface to grasp with the basket at this point.  A guidewire was then passed through the ureteroscope to the kidney.  The ureteroscope was removed along with the sheath.  The cystoscope was reinserted over the wire and a 6-French 26-cm Contour double-J stent with tether was passed to the kidney under fluoroscopic guidance.  The wire was removed leaving good coil in the kidney and good coil in the bladder.  The bladder was then drained.  The cystoscope was removed leaving the stent string exiting the urethra.  The stent string was secured to the patient's penis.  He was taken down from lithotomy position.  His anesthetic was reversed.  He was moved to the recovery room in stable condition.  Of note, a B and O suppository was placed prior to taking down out of lithotomy.  There were no complications.     Excell Seltzer. Annabell Howells, M.D.     JJW/MEDQ  D:  01/02/2016  T:  01/03/2016  Job:  [id number]

## 2016-01-04 ENCOUNTER — Encounter (HOSPITAL_COMMUNITY): Payer: Self-pay | Admitting: *Deleted

## 2016-01-04 ENCOUNTER — Emergency Department (HOSPITAL_COMMUNITY)
Admission: EM | Admit: 2016-01-04 | Discharge: 2016-01-04 | Disposition: A | Discharge status: Home / Self Care | Payer: BLUE CROSS/BLUE SHIELD | Attending: Emergency Medicine | Admitting: Emergency Medicine

## 2016-01-04 DIAGNOSIS — F17290 Nicotine dependence, other tobacco product, uncomplicated: Secondary | ICD-10-CM | POA: Diagnosis not present

## 2016-01-04 DIAGNOSIS — R109 Unspecified abdominal pain: Secondary | ICD-10-CM | POA: Insufficient documentation

## 2016-01-04 DIAGNOSIS — I10 Essential (primary) hypertension: Secondary | ICD-10-CM | POA: Diagnosis not present

## 2016-01-04 DIAGNOSIS — Z79899 Other long term (current) drug therapy: Secondary | ICD-10-CM | POA: Insufficient documentation

## 2016-01-04 LAB — URINALYSIS, ROUTINE W REFLEX MICROSCOPIC
Bilirubin Urine: NEGATIVE
Glucose, UA: NEGATIVE mg/dL
KETONES UR: NEGATIVE mg/dL
LEUKOCYTES UA: NEGATIVE
Nitrite: POSITIVE — AB
PH: 5.5 (ref 5.0–8.0)
Protein, ur: 30 mg/dL — AB
Specific Gravity, Urine: 1.015 (ref 1.005–1.030)

## 2016-01-04 LAB — BASIC METABOLIC PANEL
ANION GAP: 8 (ref 5–15)
BUN: 14 mg/dL (ref 6–20)
CALCIUM: 9.2 mg/dL (ref 8.9–10.3)
CO2: 26 mmol/L (ref 22–32)
Chloride: 104 mmol/L (ref 101–111)
Creatinine, Ser: 1.13 mg/dL (ref 0.61–1.24)
GFR calc Af Amer: >60 mL/min (ref >60)
GFR calc non Af Amer: >60 mL/min (ref >60)
GLUCOSE: 120 mg/dL — AB (ref 65–99)
Potassium: 3.3 mmol/L — ABNORMAL LOW (ref 3.5–5.1)
Sodium: 138 mmol/L (ref 135–145)

## 2016-01-04 LAB — URINE MICROSCOPIC-ADD ON: Squamous Epithelial / LPF: NONE SEEN

## 2016-01-04 LAB — CBC WITH DIFFERENTIAL/PLATELET
BASOS ABS: 0 10*3/uL (ref 0.0–0.1)
Basophils Relative: 0 %
Eosinophils Absolute: 0 10*3/uL (ref 0.0–0.7)
Eosinophils Relative: 0 %
HEMATOCRIT: 42.2 % (ref 39.0–52.0)
Hemoglobin: 14.2 g/dL (ref 13.0–17.0)
LYMPHS ABS: 3 10*3/uL (ref 0.7–4.0)
LYMPHS PCT: 30 %
MCH: 32.9 pg (ref 26.0–34.0)
MCHC: 33.6 g/dL (ref 30.0–36.0)
MCV: 97.9 fL (ref 78.0–100.0)
MONO ABS: 0.6 10*3/uL (ref 0.1–1.0)
MONOS PCT: 6 %
NEUTROS ABS: 6.2 10*3/uL (ref 1.7–7.7)
Neutrophils Relative %: 64 %
Platelets: 283 10*3/uL (ref 150–400)
RBC: 4.31 MIL/uL (ref 4.22–5.81)
RDW: 12.7 % (ref 11.5–15.5)
WBC: 9.8 10*3/uL (ref 4.0–10.5)

## 2016-01-04 MED ORDER — HYDROMORPHONE HCL 1 MG/ML IJ SOLN
1.0000 mg | Freq: Once | INTRAMUSCULAR | Status: AC
  Administered 2016-01-04: 1 mg via INTRAVENOUS
  Filled 2016-01-04: qty 1

## 2016-01-04 MED ORDER — ONDANSETRON HCL 4 MG/2ML IJ SOLN
4.0000 mg | Freq: Once | INTRAMUSCULAR | Status: AC
  Administered 2016-01-04: 4 mg via INTRAVENOUS
  Filled 2016-01-04: qty 2

## 2016-01-04 MED ORDER — CEPHALEXIN 500 MG PO CAPS
500.0000 mg | ORAL_CAPSULE | Freq: Once | ORAL | Status: AC
  Administered 2016-01-04: 500 mg via ORAL
  Filled 2016-01-04: qty 1

## 2016-01-04 MED ORDER — KETOROLAC TROMETHAMINE 30 MG/ML IJ SOLN
30.0000 mg | Freq: Once | INTRAMUSCULAR | Status: AC
Start: 2016-01-04 — End: 2016-01-04
  Administered 2016-01-04: 30 mg via INTRAVENOUS
  Filled 2016-01-04: qty 1

## 2016-01-04 MED ORDER — SODIUM CHLORIDE 0.9 % IV BOLUS (SEPSIS)
500.0000 mL | Freq: Once | INTRAVENOUS | Status: AC
  Administered 2016-01-04: 500 mL via INTRAVENOUS

## 2016-01-04 MED ORDER — CEPHALEXIN 500 MG PO CAPS: 500.0000 mg | ORAL_CAPSULE | Freq: Four times a day (QID) | ORAL | 0 refills | Status: DC

## 2016-01-04 MED ORDER — HYDROMORPHONE HCL 4 MG PO TABS: 4.0000 mg | ORAL_TABLET | ORAL | 0 refills | Status: DC | PRN

## 2016-01-04 MED ORDER — ONDANSETRON 4 MG PO TBDP: ORAL_TABLET | ORAL | 0 refills | Status: DC

## 2016-01-04 NOTE — ED Triage Notes (Signed)
States he had surgery for kidney stones on the 12th, got his stent caught yesterday and he pulled it out, states he cannot control his pain now

## 2016-01-04 NOTE — ED Provider Notes (Signed)
[facility]EM[facility]Y DEPT Provider Note   CSN: [id number] Arrival date & time: 01/04/16  1122  By signing my name below, I, Majel HomerPeyton Lee, attest that this documentation has been prepared under the direction and in the presence of Bethann BerkshireJoseph Jameya Pontiff, MD . Electronically Signed: Majel HomerPeyton Lee, Scribe. 01/04/2016. 11:49 AM.  History   Chief Complaint Chief Complaint  Patient presents with  . Flank Pain   Patient complains of right flank pain. He has a history of kidney stones. He had a stent placed and accidentally pulled it out yesterday.   The history is provided by the patient. No language interpreter was used.  Flank Pain  This is a recurrent problem. The current episode started yesterday. The problem has been gradually worsening. Pertinent negatives include no chest pain, no abdominal pain and no headaches. Nothing aggravates the symptoms. Nothing relieves the symptoms. He has tried a warm compress for the symptoms. The treatment provided no relief.   HPI Comments: Jorge Nichols is a 25 y.o. male with PMHx of HTN and right ureteral stone, who presents to the Emergency Department complaining of gradually worsening, right flank pain that began last night and worsened this morning. Pt reports PSHx on 10/12 to remove a stone in his right ureter and had a stent placed. He states he got his stent "caught" yesterday and pulled it out. He notes increasing pain ever since. Pt states he has tried a warm heating pad and taken. 5mg  percocet with no releif.   Past Medical History:  Diagnosis Date  . Anxiety   . Chronic headaches   . Chronic hip pain   . Hematuria   . History of avascular necrosis of capital femoral epiphysis    bilateral hip s/p  replacement's  . Hypertension   . Mixed dyslipidemia   . Nephrolithiasis    bilateral  non-obstuctive per ct 12-25-2015  . Other hyperparathyroidism (HCC)   . Right ureteral stone   . Vitamin D deficiency   . Weak urinary stream     Patient Active Problem  List   Diagnosis Date Noted  . Vitamin D deficiency 06/21/2015  . Other hyperparathyroidism (HCC) 06/05/2015  . BP (high blood pressure) 05/13/2015  . Calculus of kidney 05/13/2015  . Arthralgia of hip 12/04/2013  . Iliopsoas bursitis 12/04/2013  . History of repair of hip joint 07/03/2013  . Avascular necrosis of bone (HCC) 03/20/2013  . Mixed dyslipidemia 09/09/2012  . Metabolic syndrome 09/09/2012  . Morbid obesity (HCC) 09/09/2012  . Sleep disorder breathing 09/09/2012  . Family history of premature CAD 09/09/2012    Past Surgical History:  Procedure Laterality Date  . CYSTOSCOPY WITH URETEROSCOPY, STONE BASKETRY AND STENT PLACEMENT Right 01/02/2016   Procedure: CYSTOSCOPY WITH RIGHT URETEROSCOPY, STONE EXTRACTION  AND STENT PLACEMENT;  Surgeon: Bjorn PippinJohn Wrenn, MD;  [facility] [place];  Service: Urology;  Laterality: Right;  . EXTRACORPOREAL SHOCK WAVE LITHOTRIPSY  yrs ago  . HIP SURGERY Bilateral 2015  . HOLMIUM LASER APPLICATION Right 01/02/2016   Procedure: HOLMIUM LASER APPLICATION;  Surgeon: Bjorn PippinJohn Wrenn, MD;  [facility] [place];  Service: Urology;  Laterality: Right;  . TOTAL HIP ARTHROPLASTY Bilateral 2015  . URETEROLITHOTOMY  x2  last one 2007     Home Medications    Prior to Admission medications   Medication Sig Start Date End Date Taking? Authorizing Provider  metoprolol (LOPRESSOR) 50 MG tablet Take 50 mg by mouth 2 (two) times daily.    Historical Provider, MD  ondansetron (  ZOFRAN ODT) 4 MG disintegrating tablet Allow 1-2 tablets to dissolve in your mouth every 8 hours as needed for nausea/vomiting 11/18/15   Loleta Rose, MD  oxyCODONE-acetaminophen (PERCOCET/ROXICET) 5-325 MG tablet Take 1 tablet by mouth every 4 (four) hours as needed. 01/02/16   Bjorn Pippin, MD  phenazopyridine (PYRIDIUM) 200 MG tablet Take 1 tablet (200 mg total) by mouth 3 (three) times daily as needed for pain. 01/02/16   Bjorn Pippin, MD  potassium citrate  (UROCIT-K) 10 MEQ (1080 MG) SR tablet Take 10 mEq by mouth 3 (three) times daily. 07/02/15   Historical Provider, MD  tamsulosin (FLOMAX) 0.4 MG CAPS capsule Take 1 tablet by mouth daily until you pass the kidney stone or no longer have symptoms Patient taking differently: Take 0.4 mg by mouth daily after supper. Take 1 tablet by mouth daily until you pass the kidney stone or no longer have symptoms 11/18/15   Loleta Rose, MD  Vitamin D, Ergocalciferol, (DRISDOL) 50000 units CAPS capsule Take 1 capsule (50,000 Units total) by mouth every 7 (seven) days. 10/23/15   Roma Kayser, MD    Family History Family History  Problem Relation Age of Onset  . Hypertension Father     Social History Social History  Substance Use Topics  . Smoking status: Current Every Day Smoker    Packs/day: 0.50    Years: 15.00  . Smokeless tobacco: Current User    Types: Snuff  . Alcohol use Yes     Comment: occassionally     Allergies   Review of patient's allergies indicates no known allergies.   Review of Systems Review of Systems  Constitutional: Negative for appetite change and fatigue.  HENT: Negative for congestion, ear discharge and sinus pressure.   Eyes: Negative for discharge.  Respiratory: Negative for cough.   Cardiovascular: Negative for chest pain.  Gastrointestinal: Negative for abdominal pain and diarrhea.  Genitourinary: Positive for flank pain. Negative for frequency and hematuria.  Musculoskeletal: Negative for back pain.  Skin: Negative for rash.  Neurological: Negative for seizures and headaches.  Psychiatric/Behavioral: Negative for hallucinations.     Physical Exam Updated Vital Signs BP 165/96 (BP Location: Left Arm)   Pulse 104   Temp 97.7 F (36.5 C) (Oral)   Resp 20   Ht 5\' 8"  (1.727 m)   Wt 280 lb (127 kg)   SpO2 99%   BMI 42.57 kg/m   Physical Exam  Constitutional: He is oriented to person, place, and time. He appears well-developed.  HENT:  Head:  Normocephalic.  Eyes: Conjunctivae and EOM are normal. No scleral icterus.  Neck: Neck supple. No thyromegaly present.  Cardiovascular: Normal rate and regular rhythm.  Exam reveals no gallop and no friction rub.   No murmur heard. Pulmonary/Chest: No stridor. He has no wheezes. He has no rales. He exhibits no tenderness.  Abdominal: He exhibits no distension. There is tenderness. There is no rebound.  Moderate right flank tenderness  Musculoskeletal: Normal range of motion. He exhibits no edema.  Lymphadenopathy:    He has no cervical adenopathy.  Neurological: He is oriented to person, place, and time. He exhibits normal muscle tone. Coordination normal.  Skin: No rash noted. No erythema.  Psychiatric: He has a normal mood and affect. His behavior is normal.   ED Treatments / Results  Labs (all labs ordered are listed, but only abnormal results are displayed) Labs Reviewed - No data to display  EKG  EKG Interpretation None  Radiology No results found.  Procedures Procedures (including critical care time)  Medications Ordered in ED Medications - No data to display  DIAGNOSTIC STUDIES:  Oxygen Saturation is 99% on RA, normal by my interpretation.    COORDINATION OF CARE:  11:46 AM Discussed treatment plan with pt at bedside and pt agreed to plan.  Initial Impression / Assessment and Plan / ED Course  I have reviewed the triage vital signs and the nursing notes.  Pertinent labs & imaging results that were available during my care of the patient were reviewed by me and considered in my medical decision making (see chart for details).  Clinical Course    Patient with hematuria and possible urinary tract infection. Patient with pain after stent removed accidentally. He improved with pain medicine nausea medicine. Patient will be sent home with pain and nausea medicine and have a urine culture started on Keflex and told to see urology to beginning the week. If he  has problems prior to then he is to get in touch with them  I personally performed the services described in this documentation, which was scribed in my presence. The recorded information has been reviewed and is accurate.   Final Clinical Impressions(s) / ED Diagnoses   Final diagnoses:  None    New Prescriptions New Prescriptions   No medications on file     Bethann Berkshire, MD 01/04/16 1525

## 2016-01-04 NOTE — Discharge Instructions (Signed)
Follow-up with your urologist next week °

## 2016-01-07 LAB — URINE CULTURE: Culture: NO GROWTH

## 2016-01-10 ENCOUNTER — Other Ambulatory Visit: Payer: Self-pay | Admitting: Urology

## 2016-01-10 ENCOUNTER — Ambulatory Visit (INDEPENDENT_AMBULATORY_CARE_PROVIDER_SITE_OTHER): Payer: BLUE CROSS/BLUE SHIELD | Admitting: Urology

## 2016-01-10 DIAGNOSIS — N200 Calculus of kidney: Secondary | ICD-10-CM

## 2016-01-10 DIAGNOSIS — N201 Calculus of ureter: Secondary | ICD-10-CM

## 2016-01-20 ENCOUNTER — Ambulatory Visit (HOSPITAL_COMMUNITY): Admission: RE | Admit: 2016-01-20 | Payer: BLUE CROSS/BLUE SHIELD | Source: Ambulatory Visit

## 2016-01-24 ENCOUNTER — Ambulatory Visit (HOSPITAL_COMMUNITY)
Admission: RE | Admit: 2016-01-24 | Discharge: 2016-01-24 | Disposition: A | Discharge status: Home / Self Care | Payer: Medicare Other | Source: Ambulatory Visit | Attending: Urology | Admitting: Urology

## 2016-01-24 ENCOUNTER — Ambulatory Visit (INDEPENDENT_AMBULATORY_CARE_PROVIDER_SITE_OTHER): Payer: Medicare Other | Admitting: Urology

## 2016-01-24 DIAGNOSIS — N201 Calculus of ureter: Secondary | ICD-10-CM

## 2016-01-24 DIAGNOSIS — N200 Calculus of kidney: Secondary | ICD-10-CM | POA: Insufficient documentation

## 2016-02-03 ENCOUNTER — Emergency Department (HOSPITAL_COMMUNITY)
Admission: EM | Admit: 2016-02-03 | Discharge: 2016-02-03 | Disposition: A | Discharge status: Home / Self Care | Payer: Medicare Other | Attending: Emergency Medicine | Admitting: Emergency Medicine

## 2016-02-03 ENCOUNTER — Encounter (HOSPITAL_COMMUNITY): Payer: Self-pay | Admitting: *Deleted

## 2016-02-03 DIAGNOSIS — F17200 Nicotine dependence, unspecified, uncomplicated: Secondary | ICD-10-CM | POA: Insufficient documentation

## 2016-02-03 DIAGNOSIS — Z79899 Other long term (current) drug therapy: Secondary | ICD-10-CM | POA: Diagnosis not present

## 2016-02-03 DIAGNOSIS — N23 Unspecified renal colic: Secondary | ICD-10-CM | POA: Diagnosis not present

## 2016-02-03 DIAGNOSIS — I10 Essential (primary) hypertension: Secondary | ICD-10-CM | POA: Insufficient documentation

## 2016-02-03 DIAGNOSIS — F17290 Nicotine dependence, other tobacco product, uncomplicated: Secondary | ICD-10-CM | POA: Diagnosis not present

## 2016-02-03 DIAGNOSIS — R109 Unspecified abdominal pain: Secondary | ICD-10-CM | POA: Diagnosis present

## 2016-02-03 LAB — BASIC METABOLIC PANEL
Anion gap: 8 (ref 5–15)
BUN: 8 mg/dL (ref 6–20)
CALCIUM: 9 mg/dL (ref 8.9–10.3)
CO2: 23 mmol/L (ref 22–32)
CREATININE: 1.04 mg/dL (ref 0.61–1.24)
Chloride: 109 mmol/L (ref 101–111)
Glucose, Bld: 120 mg/dL — ABNORMAL HIGH (ref 65–99)
Potassium: 3.4 mmol/L — ABNORMAL LOW (ref 3.5–5.1)
SODIUM: 140 mmol/L (ref 135–145)

## 2016-02-03 LAB — URINALYSIS, ROUTINE W REFLEX MICROSCOPIC
BILIRUBIN URINE: NEGATIVE
Glucose, UA: NEGATIVE mg/dL
KETONES UR: NEGATIVE mg/dL
Leukocytes, UA: NEGATIVE
Nitrite: NEGATIVE
SPECIFIC GRAVITY, URINE: 1.025 (ref 1.005–1.030)
pH: 6 (ref 5.0–8.0)

## 2016-02-03 LAB — URINE MICROSCOPIC-ADD ON
Bacteria, UA: NONE SEEN
SQUAMOUS EPITHELIAL / LPF: NONE SEEN
WBC, UA: NONE SEEN WBC/hpf (ref 0–5)

## 2016-02-03 MED ORDER — PROMETHAZINE HCL 25 MG PO TABS: 25.0000 mg | ORAL_TABLET | Freq: Four times a day (QID) | ORAL | 0 refills | Status: DC | PRN

## 2016-02-03 MED ORDER — ONDANSETRON HCL 4 MG/2ML IJ SOLN
4.0000 mg | Freq: Once | INTRAMUSCULAR | Status: AC
  Administered 2016-02-03: 4 mg via INTRAVENOUS
  Filled 2016-02-03: qty 2

## 2016-02-03 MED ORDER — TAMSULOSIN HCL 0.4 MG PO CAPS: 0.4000 mg | ORAL_CAPSULE | Freq: Every day | ORAL | 0 refills | Status: DC

## 2016-02-03 MED ORDER — HYDROMORPHONE HCL 2 MG/ML IJ SOLN
2.0000 mg | Freq: Once | INTRAMUSCULAR | Status: AC
  Administered 2016-02-03: 2 mg via INTRAVENOUS
  Filled 2016-02-03: qty 1

## 2016-02-03 MED ORDER — OXYCODONE-ACETAMINOPHEN 5-325 MG PO TABS: 2.0000 | ORAL_TABLET | ORAL | 0 refills | Status: DC | PRN

## 2016-02-03 MED ORDER — KETOROLAC TROMETHAMINE 30 MG/ML IJ SOLN
30.0000 mg | Freq: Once | INTRAMUSCULAR | Status: AC
  Administered 2016-02-03: 30 mg via INTRAVENOUS
  Filled 2016-02-03: qty 1

## 2016-02-03 MED ORDER — OXYCODONE-ACETAMINOPHEN 5-325 MG PO TABS: 1.0000 | ORAL_TABLET | ORAL | 0 refills | Status: DC | PRN

## 2016-02-03 MED FILL — Oxycodone w/ Acetaminophen Tab 5-325 MG: ORAL | Qty: 6 | Status: AC

## 2016-02-03 NOTE — ED Triage Notes (Signed)
Pt reports waking, around 12 am, to pain in his left flank area that has moved into his left groin. Pt also reports n/v as well.

## 2016-02-03 NOTE — ED Provider Notes (Signed)
[facility] DEPT Provider Note   CSN: [id number] Arrival date & time: 02/03/16  0305     History   Chief Complaint Chief Complaint  Patient presents with  . Flank Pain    HPI Jorge Nichols is a 25 y.o. male.  Patient reports sudden onset of left flank pain at midnight. Patient reports sharp, stabbing continuous pain that now has moved into the left groin area. He has had associated nausea and vomiting. Symptoms are consistent with previous renal colic.      Past Medical History:  Diagnosis Date  . Anxiety   . Chronic headaches   . Chronic hip pain   . Hematuria   . History of avascular necrosis of capital femoral epiphysis    bilateral hip s/p  replacement's  . Hypertension   . Mixed dyslipidemia   . Nephrolithiasis    bilateral  non-obstuctive per ct 12-25-2015  . Other hyperparathyroidism (HCC)   . Right ureteral stone   . Vitamin D deficiency   . Weak urinary stream     Patient Active Problem List   Diagnosis Date Noted  . Vitamin D deficiency 06/21/2015  . Other hyperparathyroidism (HCC) 06/05/2015  . BP (high blood pressure) 05/13/2015  . Calculus of kidney 05/13/2015  . Arthralgia of hip 12/04/2013  . Iliopsoas bursitis 12/04/2013  . History of repair of hip joint 07/03/2013  . Avascular necrosis of bone (HCC) 03/20/2013  . Mixed dyslipidemia 09/09/2012  . Metabolic syndrome 09/09/2012  . Morbid obesity (HCC) 09/09/2012  . Sleep disorder breathing 09/09/2012  . Family history of premature CAD 09/09/2012    Past Surgical History:  Procedure Laterality Date  . CYSTOSCOPY WITH URETEROSCOPY, STONE BASKETRY AND STENT PLACEMENT Right 01/02/2016   Procedure: CYSTOSCOPY WITH RIGHT URETEROSCOPY, STONE EXTRACTION  AND STENT PLACEMENT;  Surgeon: Bjorn Pippin, MD;  Location: [facility];  Service: Urology;  Laterality: Right;  . EXTRACORPOREAL SHOCK WAVE LITHOTRIPSY  yrs ago  . HIP SURGERY Bilateral 2015  . HOLMIUM LASER APPLICATION Right  01/02/2016   Procedure: HOLMIUM LASER APPLICATION;  Surgeon: Bjorn Pippin, MD;  Location[facility];  Service: Urology;  Laterality: Right;  . TOTAL HIP ARTHROPLASTY Bilateral 2015  . URETEROLITHOTOMY  x2  last one 2007       Home Medications    Prior to Admission medications   Medication Sig Start Date End Date Taking? Authorizing Provider  cephALEXin (KEFLEX) 500 MG capsule Take 1 capsule (500 mg total) by mouth 4 (four) times daily. 01/04/16   Bethann Berkshire, MD  HYDROmorphone (DILAUDID) 4 MG tablet Take 1 tablet (4 mg total) by mouth every 4 (four) hours as needed for severe pain. 01/04/16   Bethann Berkshire, MD  metoprolol (LOPRESSOR) 50 MG tablet Take 50 mg by mouth 2 (two) times daily.    Historical Provider, MD  ondansetron (ZOFRAN ODT) 4 MG disintegrating tablet 4mg  ODT q4 hours prn nausea/vomit 01/04/16   Bethann Berkshire, MD  oxyCODONE-acetaminophen (PERCOCET/ROXICET) 5-325 MG tablet Take 2 tablets by mouth every 4 (four) hours as needed for severe pain. 02/03/16   Gilda Crease, MD  phenazopyridine (PYRIDIUM) 200 MG tablet Take 1 tablet (200 mg total) by mouth 3 (three) times daily as needed for pain. 01/02/16   Bjorn Pippin, MD  potassium citrate (UROCIT-K) 10 MEQ (1080 MG) SR tablet Take 10 mEq by mouth 3 (three) times daily. 07/02/15   Historical Provider, MD  tamsulosin (FLOMAX) 0.4 MG CAPS capsule Take 1 tablet by mouth  daily until you pass the kidney stone or no longer have symptoms Patient taking differently: Take 0.4 mg by mouth daily after supper. Take 1 tablet by mouth daily until you pass the kidney stone or no longer have symptoms 11/18/15   Loleta Roseory Forbach, MD  Vitamin D, Ergocalciferol, (DRISDOL) 50000 units CAPS capsule Take 1 capsule (50,000 Units total) by mouth every 7 (seven) days. 10/23/15   Roma KayserGebreselassie W Nida, MD    Family History Family History  Problem Relation Age of Onset  . Hypertension Father     Social History Social History  Substance  Use Topics  . Smoking status: Current Every Day Smoker    Packs/day: 0.50    Years: 15.00  . Smokeless tobacco: Current User    Types: Snuff  . Alcohol use Yes     Comment: occassionally     Allergies   Patient has no known allergies.   Review of Systems Review of Systems  Gastrointestinal: Positive for nausea and vomiting.  Genitourinary: Positive for flank pain.  All other systems reviewed and are negative.    Physical Exam Updated Vital Signs BP (!) 143/104   Pulse 93   Temp 97.7 F (36.5 C) (Oral)   Resp 22   Ht 5\' 8"  (1.727 m)   Wt 280 lb (127 kg)   SpO2 95%   BMI 42.57 kg/m   Physical Exam  Constitutional: He is oriented to person, place, and time. He appears well-developed and well-nourished. He appears distressed.  HENT:  Head: Normocephalic and atraumatic.  Right Ear: Hearing normal.  Left Ear: Hearing normal.  Nose: Nose normal.  Mouth/Throat: Oropharynx is clear and moist and mucous membranes are normal.  Eyes: Conjunctivae and EOM are normal. Pupils are equal, round, and reactive to light.  Neck: Normal range of motion. Neck supple.  Cardiovascular: Regular rhythm, S1 normal and S2 normal.  Exam reveals no gallop and no friction rub.   No murmur heard. Pulmonary/Chest: Effort normal and breath sounds normal. No respiratory distress. He exhibits no tenderness.  Abdominal: Soft. Normal appearance and bowel sounds are normal. There is no hepatosplenomegaly. There is no tenderness. There is no rebound, no guarding, no tenderness at McBurney's point and negative Murphy's sign. No hernia.  Musculoskeletal: Normal range of motion.  Neurological: He is alert and oriented to person, place, and time. He has normal strength. No cranial nerve deficit or sensory deficit. Coordination normal. GCS eye subscore is 4. GCS verbal subscore is 5. GCS motor subscore is 6.  Skin: Skin is warm, dry and intact. No rash noted. No cyanosis.  Psychiatric: He has a normal mood  and affect. His speech is normal and behavior is normal. Thought content normal.  Nursing note and vitals reviewed.    ED Treatments / Results  Labs (all labs ordered are listed, but only abnormal results are displayed) Labs Reviewed  BASIC METABOLIC PANEL - Abnormal; Notable for the following:       Result Value   Potassium 3.4 (*)    Glucose, Bld 120 (*)    All other components within normal limits  URINALYSIS, ROUTINE W REFLEX MICROSCOPIC (NOT AT [facility]) - Abnormal; Notable for the following:    Hgb urine dipstick LARGE (*)    Protein, ur TRACE (*)    All other components within normal limits  URINE MICROSCOPIC-ADD ON    EKG  EKG Interpretation None       Radiology No results found.  Procedures Procedures (including critical care time)  Medications Ordered in ED Medications  ketorolac (TORADOL) 30 MG/ML injection 30 mg (not administered)  HYDROmorphone (DILAUDID) injection 2 mg (2 mg Intravenous Given 02/03/16 0338)  ondansetron (ZOFRAN) injection 4 mg (4 mg Intravenous Given 02/03/16 0338)     Initial Impression / Assessment and Plan / ED Course  I have reviewed the triage vital signs and the nursing notes.  Pertinent labs & imaging results that were available during my care of the patient were reviewed by me and considered in my medical decision making (see chart for details).  Clinical Course    Presents with sudden onset left flank pain. Patient with history of recurrent ureterolithiasis and renal colic, describe similar symptoms. Urinalysis shows large blood. There was small fragments of stone noted in the urine sample.Patient reports improved pain after analgesia. No imaging necessary at this time, will empirically, follow up with urology. Return for worsening symptoms.  Final Clinical Impressions(s) / ED Diagnoses   Final diagnoses:  Ureteral colic    New Prescriptions New Prescriptions   OXYCODONE-ACETAMINOPHEN (PERCOCET/ROXICET) 5-325 MG TABLET     Take 2 tablets by mouth every 4 (four) hours as needed for severe pain.     Gilda Creasehristopher J Antasia Haider, MD 02/03/16 [phone]

## 2016-02-03 NOTE — ED Notes (Signed)
ED Provider at bedside. 

## 2016-02-11 DIAGNOSIS — M25512 Pain in left shoulder: Secondary | ICD-10-CM | POA: Diagnosis not present

## 2016-02-11 DIAGNOSIS — F419 Anxiety disorder, unspecified: Secondary | ICD-10-CM | POA: Diagnosis not present

## 2016-02-11 DIAGNOSIS — I10 Essential (primary) hypertension: Secondary | ICD-10-CM | POA: Diagnosis not present

## 2016-02-17 ENCOUNTER — Emergency Department
Admission: EM | Admit: 2016-02-17 | Discharge: 2016-02-17 | Disposition: A | Discharge status: Home / Self Care | Payer: Medicare Other | Attending: Emergency Medicine | Admitting: Emergency Medicine

## 2016-02-17 ENCOUNTER — Encounter: Payer: Self-pay | Admitting: Emergency Medicine

## 2016-02-17 DIAGNOSIS — R0789 Other chest pain: Secondary | ICD-10-CM | POA: Insufficient documentation

## 2016-02-17 DIAGNOSIS — R1011 Right upper quadrant pain: Secondary | ICD-10-CM | POA: Insufficient documentation

## 2016-02-17 DIAGNOSIS — F17290 Nicotine dependence, other tobacco product, uncomplicated: Secondary | ICD-10-CM | POA: Insufficient documentation

## 2016-02-17 DIAGNOSIS — Z79899 Other long term (current) drug therapy: Secondary | ICD-10-CM | POA: Diagnosis not present

## 2016-02-17 LAB — CBC WITH DIFFERENTIAL/PLATELET
Basophils Absolute: 0.1 10*3/uL (ref 0–0.1)
Basophils Relative: 1 %
EOS ABS: 0.4 10*3/uL (ref 0–0.7)
Eosinophils Relative: 5 %
HCT: 44.7 % (ref 40.0–52.0)
HEMOGLOBIN: 15.2 g/dL (ref 13.0–18.0)
LYMPHS ABS: 1.8 10*3/uL (ref 1.0–3.6)
Lymphocytes Relative: 24 %
MCH: 32.9 pg (ref 26.0–34.0)
MCHC: 34 g/dL (ref 32.0–36.0)
MCV: 96.5 fL (ref 80.0–100.0)
MONOS PCT: 8 %
Monocytes Absolute: 0.6 10*3/uL (ref 0.2–1.0)
Neutro Abs: 4.6 10*3/uL (ref 1.4–6.5)
Neutrophils Relative %: 62 %
Platelets: 267 10*3/uL (ref 150–440)
RBC: 4.63 MIL/uL (ref 4.40–5.90)
RDW: 14.3 % (ref 11.5–14.5)
WBC: 7.4 10*3/uL (ref 3.8–10.6)

## 2016-02-17 LAB — COMPREHENSIVE METABOLIC PANEL
ALK PHOS: 70 U/L (ref 38–126)
ALT: 53 U/L (ref 17–63)
ANION GAP: 9 (ref 5–15)
AST: 56 U/L — ABNORMAL HIGH (ref 15–41)
Albumin: 4.1 g/dL (ref 3.5–5.0)
BILIRUBIN TOTAL: 0.9 mg/dL (ref 0.3–1.2)
BUN: 8 mg/dL (ref 6–20)
CALCIUM: 9.3 mg/dL (ref 8.9–10.3)
CO2: 25 mmol/L (ref 22–32)
Chloride: 106 mmol/L (ref 101–111)
Creatinine, Ser: 0.83 mg/dL (ref 0.61–1.24)
Glucose, Bld: 103 mg/dL — ABNORMAL HIGH (ref 65–99)
Potassium: 3.8 mmol/L (ref 3.5–5.1)
SODIUM: 140 mmol/L (ref 135–145)
TOTAL PROTEIN: 7.2 g/dL (ref 6.5–8.1)

## 2016-02-17 LAB — LIPASE, BLOOD: LIPASE: 24 U/L (ref 11–51)

## 2016-02-17 MED ORDER — NAPROXEN 500 MG PO TABS: 500.0000 mg | ORAL_TABLET | Freq: Two times a day (BID) | ORAL | 2 refills | Status: DC

## 2016-02-17 NOTE — ED Provider Notes (Signed)
[facility] Emergency Department Provider Note   ____________________________________________    I have reviewed the triage vital signs and the nursing notes.   HISTORY  Chief Complaint Abdominal Pain     HPI Jorge Nichols is a 25 y.o. male who presents with complaints of right lower chest/right upper abdominal pain. He reports this pain has been going on for about 3 weeks and is worse when he twists or lifts heavy objects. He denies injury to the area. No short of breath. No fevers or chills. No cough. Denies worsening pain after eating. No radiation of pain, pain is mild to moderate   Past Medical History:  Diagnosis Date  . Anxiety   . Chronic headaches   . Chronic hip pain   . Hematuria   . History of avascular necrosis of capital femoral epiphysis    bilateral hip s/p  replacement's  . Hypertension   . Mixed dyslipidemia   . Nephrolithiasis    bilateral  non-obstuctive per ct 12-25-2015  . Other hyperparathyroidism (HCC)   . Right ureteral stone   . Vitamin D deficiency   . Weak urinary stream     Patient Active Problem List   Diagnosis Date Noted  . Vitamin D deficiency 06/21/2015  . Other hyperparathyroidism (HCC) 06/05/2015  . BP (high blood pressure) 05/13/2015  . Calculus of kidney 05/13/2015  . Arthralgia of hip 12/04/2013  . Iliopsoas bursitis 12/04/2013  . History of repair of hip joint 07/03/2013  . Avascular necrosis of bone (HCC) 03/20/2013  . Mixed dyslipidemia 09/09/2012  . Metabolic syndrome 09/09/2012  . Morbid obesity (HCC) 09/09/2012  . Sleep disorder breathing 09/09/2012  . Family history of premature CAD 09/09/2012    Past Surgical History:  Procedure Laterality Date  . CYSTOSCOPY WITH URETEROSCOPY, STONE BASKETRY AND STENT PLACEMENT Right 01/02/2016   Procedure: CYSTOSCOPY WITH RIGHT URETEROSCOPY, STONE EXTRACTION  AND STENT PLACEMENT;  Surgeon: Bjorn PippinJohn Wrenn, MD;  [facility];   Service: Urology;  Laterality: Right;  . EXTRACORPOREAL SHOCK WAVE LITHOTRIPSY  yrs ago  . HIP SURGERY Bilateral 2015  . HOLMIUM LASER APPLICATION Right 01/02/2016   Procedure: HOLMIUM LASER APPLICATION;  Surgeon: Bjorn PippinJohn Wrenn, MD;  [facility] ;  Service: Urology;  Laterality: Right;  . TOTAL HIP ARTHROPLASTY Bilateral 2015  . URETEROLITHOTOMY  x2  last one 2007    Prior to Admission medications   Medication Sig Start Date End Date Taking? Authorizing Provider  cephALEXin (KEFLEX) 500 MG capsule Take 1 capsule (500 mg total) by mouth 4 (four) times daily. 01/04/16   Bethann BerkshireJoseph Zammit, MD  HYDROmorphone (DILAUDID) 4 MG tablet Take 1 tablet (4 mg total) by mouth every 4 (four) hours as needed for severe pain. 01/04/16   Bethann BerkshireJoseph Zammit, MD  metoprolol (LOPRESSOR) 50 MG tablet Take 50 mg by mouth 2 (two) times daily.    Historical Provider, MD  naproxen (NAPROSYN) 500 MG tablet Take 1 tablet (500 mg total) by mouth 2 (two) times daily with a meal. 02/17/16   Jene Everyobert Aalia Greulich, MD  ondansetron (ZOFRAN ODT) 4 MG disintegrating tablet 4mg  ODT q4 hours prn nausea/vomit 01/04/16   Bethann BerkshireJoseph Zammit, MD  oxyCODONE-acetaminophen (PERCOCET) 5-325 MG tablet Take 1-2 tablets by mouth every 4 (four) hours as needed. 02/03/16   Gilda Creasehristopher J Pollina, MD  oxyCODONE-acetaminophen (PERCOCET/ROXICET) 5-325 MG tablet Take 2 tablets by mouth every 4 (four) hours as needed for severe pain. 02/03/16   Gilda Creasehristopher J Pollina, MD  phenazopyridine (PYRIDIUM) 200 MG tablet Take 1 tablet (200 mg total) by mouth 3 (three) times daily as needed for pain. 01/02/16   Bjorn PippinJohn Wrenn, MD  potassium citrate (UROCIT-K) 10 MEQ (1080 MG) SR tablet Take 10 mEq by mouth 3 (three) times daily. 07/02/15   Historical Provider, MD  promethazine (PHENERGAN) 25 MG tablet Take 1 tablet (25 mg total) by mouth every 6 (six) hours as needed for nausea or vomiting. 02/03/16   Gilda Creasehristopher J Pollina, MD  tamsulosin (FLOMAX) 0.4 MG CAPS capsule  Take 1 capsule (0.4 mg total) by mouth daily. 02/03/16   Gilda Creasehristopher J Pollina, MD  Vitamin D, Ergocalciferol, (DRISDOL) 50000 units CAPS capsule Take 1 capsule (50,000 Units total) by mouth every 7 (seven) days. 10/23/15   Roma KayserGebreselassie W Nida, MD     Allergies Patient has no known allergies.  Family History  Problem Relation Age of Onset  . Hypertension Father     Social History Social History  Substance Use Topics  . Smoking status: Current Every Day Smoker    Packs/day: 0.50    Years: 15.00  . Smokeless tobacco: Current User    Types: Snuff  . Alcohol use Yes     Comment: occassionally    Review of Systems  Constitutional: No fever/chills  Cardiovascular: Right inferior chest wall discomfort Respiratory: Denies shortness of breath. Gastrointestinal:   No nausea, no vomiting.   Genitourinary: Negative for dysuria. Musculoskeletal: Negative for back pain. Skin: Negative for rash. Neurological: Negative for headaches  10-point ROS otherwise negative.  ____________________________________________   PHYSICAL EXAM:  VITAL SIGNS: ED Triage Vitals  Enc Vitals Group     BP 02/17/16 1831 (!) 147/94     Pulse Rate 02/17/16 1831 91     Resp 02/17/16 1831 20     Temp 02/17/16 1831 98.8 F (37.1 C)     Temp Source 02/17/16 1831 Oral     SpO2 02/17/16 1831 98 %     Weight 02/17/16 1832 290 lb (131.5 kg)     Height 02/17/16 1832 5\' 8"  (1.727 m)     Head Circumference --      Peak Flow --      Pain Score --      Pain Loc --      Pain Edu? --      Excl. in GC? --     Constitutional: Alert and oriented. No acute distress. Eyes: Conjunctivae are normal.   Nose: No congestion/rhinnorhea. Mouth/Throat: Mucous membranes are moist.    Cardiovascular: Normal rate, regular rhythm. Grossly normal heart sounds.  Good peripheral circulation.Patient is tender to palpation along the right inferior ribs, no rash or erythema Respiratory: Normal respiratory effort.  No  retractions. Lungs CTAB. Gastrointestinal: Soft and nontender. No distention.  No CVA tenderness.  Musculoskeletal: No lower extremity tenderness nor edema.  Warm and well perfused Neurologic:  Normal speech and language. No gross focal neurologic deficits are appreciated.  Skin:  Skin is warm, dry and intact. No rash noted. Psychiatric: Mood and affect are normal. Speech and behavior are normal.  ____________________________________________   LABS (all labs ordered are listed, but only abnormal results are displayed)  Labs Reviewed  COMPREHENSIVE METABOLIC PANEL - Abnormal; Notable for the following:       Result Value   Glucose, Bld 103 (*)    AST 56 (*)    All other components within normal limits  CBC WITH DIFFERENTIAL/PLATELET  LIPASE, BLOOD  URINALYSIS COMPLETEWITH MICROSCOPIC ([facility] ONLY)  ____________________________________________  EKG  ED ECG REPORT I, Jene Every, the attending physician, personally viewed and interpreted this ECG.  Date: 02/17/2016  Rate: 86 Rhythm: normal sinus rhythm QRS Axis: normal Intervals: normal ST/T Wave abnormalities: normal Conduction Disturbances: none   ____________________________________________  RADIOLOGY  None ____________________________________________   PROCEDURES  Procedure(s) performed: No    Critical Care performed: No ____________________________________________   INITIAL IMPRESSION / ASSESSMENT AND PLAN / ED COURSE  Pertinent labs & imaging results that were available during my care of the patient were reviewed by me and considered in my medical decision making (see chart for details).  Patient well-appearing and in no acute distress. Exam is most consistent with chest wall tenderness/costochondritis or muscle sprain. EKG is unremarkable, lab work is reassuring his history of present illness is not consistent with ACS or PE nor myocarditis.. Recommend supportive care, NSAIDs and follow-up with  PCP as needed. Return precautions discussed.  Clinical Course    ____________________________________________   FINAL CLINICAL IMPRESSION(S) / ED DIAGNOSES  Final diagnoses:  Chest wall pain      NEW MEDICATIONS STARTED DURING THIS VISIT:  New Prescriptions   NAPROXEN (NAPROSYN) 500 MG TABLET    Take 1 tablet (500 mg total) by mouth 2 (two) times daily with a meal.     Note:  This document was prepared using Dragon voice recognition software and may include unintentional dictation errors.    Jene Every, MD 02/17/16 2039

## 2016-02-17 NOTE — ED Triage Notes (Signed)
Reports pain in RUQ x 3 wks. Worse with heavy lifting. Denies n/v/d.

## 2016-03-12 DIAGNOSIS — G8929 Other chronic pain: Secondary | ICD-10-CM | POA: Diagnosis not present

## 2016-03-12 DIAGNOSIS — M545 Low back pain: Secondary | ICD-10-CM | POA: Diagnosis not present

## 2016-03-13 DIAGNOSIS — M545 Low back pain: Secondary | ICD-10-CM | POA: Diagnosis not present

## 2016-03-27 ENCOUNTER — Ambulatory Visit (HOSPITAL_COMMUNITY)
Admission: RE | Admit: 2016-03-27 | Discharge: 2016-03-27 | Disposition: A | Discharge status: Home / Self Care | Payer: Medicare Other | Source: Ambulatory Visit | Attending: Urology | Admitting: Urology

## 2016-03-27 ENCOUNTER — Ambulatory Visit (INDEPENDENT_AMBULATORY_CARE_PROVIDER_SITE_OTHER): Payer: Medicare Other | Admitting: Urology

## 2016-03-27 ENCOUNTER — Other Ambulatory Visit: Payer: Self-pay | Admitting: Urology

## 2016-03-27 DIAGNOSIS — N200 Calculus of kidney: Secondary | ICD-10-CM

## 2016-03-27 DIAGNOSIS — N201 Calculus of ureter: Secondary | ICD-10-CM

## 2016-04-21 ENCOUNTER — Other Ambulatory Visit: Payer: Self-pay | Admitting: Urology

## 2016-04-21 DIAGNOSIS — N200 Calculus of kidney: Secondary | ICD-10-CM

## 2016-04-22 DIAGNOSIS — M545 Low back pain: Secondary | ICD-10-CM | POA: Diagnosis not present

## 2016-04-22 DIAGNOSIS — I10 Essential (primary) hypertension: Secondary | ICD-10-CM | POA: Diagnosis not present

## 2016-04-22 DIAGNOSIS — H6691 Otitis media, unspecified, right ear: Secondary | ICD-10-CM | POA: Diagnosis not present

## 2016-04-24 ENCOUNTER — Ambulatory Visit: Payer: BLUE CROSS/BLUE SHIELD | Admitting: "Endocrinology

## 2016-05-07 DIAGNOSIS — M545 Low back pain: Secondary | ICD-10-CM | POA: Diagnosis not present

## 2016-05-07 DIAGNOSIS — E669 Obesity, unspecified: Secondary | ICD-10-CM | POA: Diagnosis not present

## 2016-05-07 DIAGNOSIS — I10 Essential (primary) hypertension: Secondary | ICD-10-CM | POA: Diagnosis not present

## 2016-05-09 ENCOUNTER — Emergency Department: Payer: Medicare Other

## 2016-05-09 ENCOUNTER — Emergency Department
Admission: EM | Admit: 2016-05-09 | Discharge: 2016-05-09 | Disposition: A | Discharge status: Home / Self Care | Payer: Medicare Other | Attending: Emergency Medicine | Admitting: Emergency Medicine

## 2016-05-09 ENCOUNTER — Encounter: Payer: Self-pay | Admitting: Emergency Medicine

## 2016-05-09 DIAGNOSIS — I10 Essential (primary) hypertension: Secondary | ICD-10-CM | POA: Insufficient documentation

## 2016-05-09 DIAGNOSIS — R1031 Right lower quadrant pain: Secondary | ICD-10-CM | POA: Diagnosis not present

## 2016-05-09 DIAGNOSIS — F17290 Nicotine dependence, other tobacco product, uncomplicated: Secondary | ICD-10-CM | POA: Diagnosis not present

## 2016-05-09 DIAGNOSIS — N200 Calculus of kidney: Secondary | ICD-10-CM | POA: Diagnosis not present

## 2016-05-09 LAB — CBC
HEMATOCRIT: 43.8 % (ref 40.0–52.0)
HEMOGLOBIN: 15.8 g/dL (ref 13.0–18.0)
MCH: 34.3 pg — ABNORMAL HIGH (ref 26.0–34.0)
MCHC: 36 g/dL (ref 32.0–36.0)
MCV: 95.1 fL (ref 80.0–100.0)
Platelets: 323 10*3/uL (ref 150–440)
RBC: 4.61 MIL/uL (ref 4.40–5.90)
RDW: 12.9 % (ref 11.5–14.5)
WBC: 8.5 10*3/uL (ref 3.8–10.6)

## 2016-05-09 LAB — COMPREHENSIVE METABOLIC PANEL WITH GFR
ALT: 67 U/L — ABNORMAL HIGH (ref 17–63)
AST: 56 U/L — ABNORMAL HIGH (ref 15–41)
Albumin: 4.2 g/dL (ref 3.5–5.0)
Alkaline Phosphatase: 62 U/L (ref 38–126)
Anion gap: 6 (ref 5–15)
BUN: 9 mg/dL (ref 6–20)
CO2: 25 mmol/L (ref 22–32)
Calcium: 9.1 mg/dL (ref 8.9–10.3)
Chloride: 108 mmol/L (ref 101–111)
Creatinine, Ser: 0.99 mg/dL (ref 0.61–1.24)
GFR calc Af Amer: >60 mL/min
GFR calc non Af Amer: >60 mL/min
Glucose, Bld: 107 mg/dL — ABNORMAL HIGH (ref 65–99)
Potassium: 3.7 mmol/L (ref 3.5–5.1)
Sodium: 139 mmol/L (ref 135–145)
Total Bilirubin: 0.5 mg/dL (ref 0.3–1.2)
Total Protein: 7.3 g/dL (ref 6.5–8.1)

## 2016-05-09 LAB — URINALYSIS, COMPLETE (UACMP) WITH MICROSCOPIC
Bilirubin Urine: NEGATIVE
Glucose, UA: NEGATIVE mg/dL
Hgb urine dipstick: NEGATIVE
Ketones, ur: NEGATIVE mg/dL
Leukocytes, UA: NEGATIVE
Nitrite: NEGATIVE
Protein, ur: NEGATIVE mg/dL
Specific Gravity, Urine: 1.019 (ref 1.005–1.030)
pH: 5 (ref 5.0–8.0)

## 2016-05-09 LAB — LIPASE, BLOOD: Lipase: 48 U/L (ref 11–51)

## 2016-05-09 MED ORDER — ONDANSETRON HCL 4 MG/2ML IJ SOLN
4.0000 mg | Freq: Once | INTRAMUSCULAR | Status: AC
  Administered 2016-05-09: 4 mg via INTRAVENOUS

## 2016-05-09 MED ORDER — IOPAMIDOL (ISOVUE-300) INJECTION 61%
125.0000 mL | Freq: Once | INTRAVENOUS | Status: AC | PRN
  Administered 2016-05-09: 150 mL via INTRAVENOUS

## 2016-05-09 MED ORDER — IOPAMIDOL (ISOVUE-300) INJECTION 61%
30.0000 mL | Freq: Once | INTRAVENOUS | Status: AC | PRN
  Administered 2016-05-09: 30 mL via ORAL

## 2016-05-09 MED ORDER — ONDANSETRON HCL 4 MG/2ML IJ SOLN
INTRAMUSCULAR | Status: AC
  Administered 2016-05-09: 4 mg via INTRAVENOUS
  Filled 2016-05-09: qty 2

## 2016-05-09 MED ORDER — MORPHINE SULFATE (PF) 4 MG/ML IV SOLN
INTRAVENOUS | Status: AC
  Administered 2016-05-09: 4 mg via INTRAVENOUS
  Filled 2016-05-09: qty 1

## 2016-05-09 MED ORDER — MORPHINE SULFATE (PF) 4 MG/ML IV SOLN
4.0000 mg | Freq: Once | INTRAVENOUS | Status: AC
  Administered 2016-05-09: 4 mg via INTRAVENOUS

## 2016-05-09 NOTE — ED Notes (Signed)
ED Provider at bedside. 

## 2016-05-09 NOTE — ED Triage Notes (Signed)
Pt states that he began having right lower quadrant abdominal pain on Friday which is progressively getting worse. Pt denies urinary symptoms but states that he does have nausea without vomiting at this point. Pt is ambulatory to triage with NAD noted at this time.

## 2016-05-09 NOTE — ED Provider Notes (Signed)
[facility] Emergency Department Provider Note _________________   First MD Initiated Contact with Patient 05/09/16 [phone]     (approximate)  I have reviewed the triage vital signs and the nursing notes.   HISTORY  Chief Complaint Abdominal Pain   HPI Jorge Nichols is a 26 y.o. male with globus of chronic medical conditions presents with acute onset of right lower quadrant abdominal pain with onset yesterday. Patient admits to nausea however no vomiting. Patient denies any constipation or diarrhea. Patient denies any fever. Patient states his current pain score is 8 out of 10   Past Medical History:  Diagnosis Date  . Anxiety   . Chronic headaches   . Chronic hip pain   . Hematuria   . History of avascular necrosis of capital femoral epiphysis    bilateral hip s/p  replacement's  . Hypertension   . Mixed dyslipidemia   . Nephrolithiasis    bilateral  non-obstuctive per ct 12-25-2015  . Other hyperparathyroidism (HCC)   . Right ureteral stone   . Vitamin D deficiency   . Weak urinary stream     Patient Active Problem List   Diagnosis Date Noted  . Vitamin D deficiency 06/21/2015  . Other hyperparathyroidism (HCC) 06/05/2015  . BP (high blood pressure) 05/13/2015  . Calculus of kidney 05/13/2015  . Arthralgia of hip 12/04/2013  . Iliopsoas bursitis 12/04/2013  . History of repair of hip joint 07/03/2013  . Avascular necrosis of bone (HCC) 03/20/2013  . Mixed dyslipidemia 09/09/2012  . Metabolic syndrome 09/09/2012  . Morbid obesity (HCC) 09/09/2012  . Sleep disorder breathing 09/09/2012  . Family history of premature CAD 09/09/2012    Past Surgical History:  Procedure Laterality Date  . CYSTOSCOPY WITH URETEROSCOPY, STONE BASKETRY AND STENT PLACEMENT Right 01/02/2016   Procedure: CYSTOSCOPY WITH RIGHT URETEROSCOPY, STONE EXTRACTION  AND STENT PLACEMENT;  Surgeon: Bjorn Pippin, MD;  Location: [facility];  Service: Urology;   Laterality: Right;  . EXTRACORPOREAL SHOCK WAVE LITHOTRIPSY  yrs ago  . HIP SURGERY Bilateral 2015  . HOLMIUM LASER APPLICATION Right 01/02/2016   Procedure: HOLMIUM LASER APPLICATION;  Surgeon: Bjorn Pippin, MD;  [facility];  Service: Urology;  Laterality: Right;  . TOTAL HIP ARTHROPLASTY Bilateral 2015  . URETEROLITHOTOMY  x2  last one 2007    Prior to Admission medications   Medication Sig Start Date End Date Taking? Authorizing Provider  cephALEXin (KEFLEX) 500 MG capsule Take 1 capsule (500 mg total) by mouth 4 (four) times daily. 01/04/16   Bethann Berkshire, MD  HYDROmorphone (DILAUDID) 4 MG tablet Take 1 tablet (4 mg total) by mouth every 4 (four) hours as needed for severe pain. 01/04/16   Bethann Berkshire, MD  metoprolol (LOPRESSOR) 50 MG tablet Take 50 mg by mouth 2 (two) times daily.    Historical Provider, MD  naproxen (NAPROSYN) 500 MG tablet Take 1 tablet (500 mg total) by mouth 2 (two) times daily with a meal. 02/17/16   Jene Every, MD  ondansetron (ZOFRAN ODT) 4 MG disintegrating tablet 4mg  ODT q4 hours prn nausea/vomit 01/04/16   Bethann Berkshire, MD  oxyCODONE-acetaminophen (PERCOCET) 5-325 MG tablet Take 1-2 tablets by mouth every 4 (four) hours as needed. 02/03/16   Gilda Crease, MD  oxyCODONE-acetaminophen (PERCOCET/ROXICET) 5-325 MG tablet Take 2 tablets by mouth every 4 (four) hours as needed for severe pain. 02/03/16   Gilda Crease, MD  phenazopyridine (PYRIDIUM) 200 MG tablet Take 1  tablet (200 mg total) by mouth 3 (three) times daily as needed for pain. 01/02/16   Bjorn PippinJohn Wrenn, MD  potassium citrate (UROCIT-K) 10 MEQ (1080 MG) SR tablet Take 10 mEq by mouth 3 (three) times daily. 07/02/15   Historical Provider, MD  promethazine (PHENERGAN) 25 MG tablet Take 1 tablet (25 mg total) by mouth every 6 (six) hours as needed for nausea or vomiting. 02/03/16   Gilda Creasehristopher J Pollina, MD  tamsulosin (FLOMAX) 0.4 MG CAPS capsule Take 1 capsule (0.4  mg total) by mouth daily. 02/03/16   Gilda Creasehristopher J Pollina, MD  Vitamin D, Ergocalciferol, (DRISDOL) 50000 units CAPS capsule Take 1 capsule (50,000 Units total) by mouth every 7 (seven) days. 10/23/15   Roma KayserGebreselassie W Nida, MD    Allergies Patient has no known allergies.  Family History  Problem Relation Age of Onset  . Hypertension Father     Social History Social History  Substance Use Topics  . Smoking status: Former Smoker    Packs/day: 0.50    Years: 15.00  . Smokeless tobacco: Current User    Types: Snuff  . Alcohol use Yes     Comment: occassionally    Review of Systems Constitutional: No fever/chills Eyes: No visual changes. ENT: No sore throat. Cardiovascular: Denies chest pain. Respiratory: Denies shortness of breath. Gastrointestinal: Positive for abdominal pain and nausea  Genitourinary: Negative for dysuria. Musculoskeletal: Negative for back pain. Skin: Negative for rash. Neurological: Negative for headaches, focal weakness or numbness.  10-point ROS otherwise negative.  ____________________________________________   PHYSICAL EXAM:  VITAL SIGNS: ED Triage Vitals  Enc Vitals Group     BP 05/09/16 0121 (!) 187/110     Pulse Rate 05/09/16 0121 77     Resp 05/09/16 0121 20     Temp 05/09/16 0121 97.6 F (36.4 C)     Temp Source 05/09/16 0121 Oral     SpO2 05/09/16 0121 97 %     Weight 05/09/16 0121 280 lb (127 kg)     Height 05/09/16 0121 5\' 8"  (1.727 m)     Head Circumference --      Peak Flow --      Pain Score 05/09/16 0122 7     Pain Loc --      Pain Edu? --      Excl. in GC? --     Constitutional: Alert and oriented. Well appearing and in no acute distress. Eyes: Conjunctivae are normal. PERRL. EOMI. Head: Atraumatic.Marland Kitchen. Mouth/Throat: Mucous membranes are moist. Oropharynx non-erythematous. Neck: No stridor.  No meningeal signs.  No cervical spine tenderness to palpation. Cardiovascular: Normal rate, regular rhythm. Good peripheral  circulation. Grossly normal heart sounds. Respiratory: Normal respiratory effort.  No retractions. Lungs CTAB. Gastrointestinal: Right lower quadrant tenderness to palpation. No distention.  Musculoskeletal: No lower extremity tenderness nor edema. No gross deformities of extremities. Neurologic:  Normal speech and language. No gross focal neurologic deficits are appreciated.  Skin:  Skin is warm, dry and intact. No rash noted. Psychiatric: Mood and affect are normal. Speech and behavior are normal.  ____________________________________________   LABS (all labs ordered are listed, but only abnormal results are displayed)  Labs Reviewed  COMPREHENSIVE METABOLIC PANEL - Abnormal; Notable for the following:       Result Value   Glucose, Bld 107 (*)    AST 56 (*)    ALT 67 (*)    All other components within normal limits  CBC - Abnormal; Notable for the following:  MCH 34.3 (*)    All other components within normal limits  URINALYSIS, COMPLETE (UACMP) WITH MICROSCOPIC - Abnormal; Notable for the following:    Color, Urine YELLOW (*)    APPearance CLEAR (*)    All other components within normal limits  LIPASE, BLOOD    RADIOLOGY I, [place] Aziah Brostrom, personally viewed and evaluated these images (plain radiographs) as part of my medical decision making, as well as reviewing the written report by the radiologist.  Ct Abdomen Pelvis W Contrast  Result Date: 05/09/2016 CLINICAL DATA:  Right lower quadrant abdominal pain on Friday, progressively worsening. Vomiting. Nausea. EXAM: CT ABDOMEN AND PELVIS WITH CONTRAST TECHNIQUE: Multidetector CT imaging of the abdomen and pelvis was performed using the standard protocol following bolus administration of intravenous contrast. CONTRAST:  ISOVUE-300 IOPAMIDOL (ISOVUE-300) INJECTION 61% COMPARISON:  12/25/2015 FINDINGS: Lower chest: Mild dependent changes in the lung bases. Hepatobiliary: Diffuse fatty infiltration of the liver. No focal  lesions identified. Gallbladder and bile ducts are unremarkable. Pancreas: Unremarkable. No pancreatic ductal dilatation or surrounding inflammatory changes. Spleen: Normal in size without focal abnormality. Adrenals/Urinary Tract: No adrenal gland nodules. Small bilateral intrarenal stones. Nephrograms are symmetrical. No hydronephrosis or hydroureter. Bladder is decompressed. Visualization of the bladder is obscured by artifact from hip arthroplasties. Stomach/Bowel: Stomach, small bowel, and colon are not abnormally distended. No bowel wall thickening or inflammatory changes are appreciated. Appendix is normal. Vascular/Lymphatic: No significant vascular findings are present. No enlarged abdominal or pelvic lymph nodes. Reproductive: Prostate is unremarkable. Other: Small periumbilical hernia containing fat. No free air or free fluid in the abdomen. Musculoskeletal: Bilateral hip arthroplasties. No destructive bone lesions. IMPRESSION: Diffuse fatty infiltration of the liver. Small bilateral nonobstructing intrarenal stones. No evidence of bowel obstruction or inflammation. Electronically Signed   By: Burman Nieves M.D.   On: 05/09/2016 04:05    ______  Procedures     INITIAL IMPRESSION / ASSESSMENT AND PLAN / ED COURSE  Pertinent labs & imaging results that were available during my care of the patient were reviewed by me and considered in my medical decision making (see chart for details).  No clear etiology for the patient's right lower quadrant pain identified CT scan of the abdomen pelvis revealed intrarenal nonobstructing stones bilaterally. Appendix normal per radiologist.      ____________________________________________  FINAL CLINICAL IMPRESSION(S) / ED DIAGNOSES  Final diagnoses:  Kidney stone  Right lower quadrant abdominal pain.   MEDICATIONS GIVEN DURING THIS VISIT:  Medications  morphine 4 MG/ML injection 4 mg (4 mg Intravenous Given 05/09/16 0315)  ondansetron  (ZOFRAN) injection 4 mg (4 mg Intravenous Given 05/09/16 0315)  iopamidol (ISOVUE-300) 61 % injection 30 mL (30 mLs Oral Contrast Given 05/09/16 0328)  iopamidol (ISOVUE-300) 61 % injection 125 mL (150 mLs Intravenous Contrast Given 05/09/16 0349)     NEW OUTPATIENT MEDICATIONS STARTED DURING THIS VISIT:  New Prescriptions   No medications on file    Modified Medications   No medications on file    Discontinued Medications   No medications on file     Note:  This document was prepared using Dragon voice recognition software and may include unintentional dictation errors.    Darci Current, MD 05/09/16 [phone]

## 2016-05-09 NOTE — ED Notes (Signed)
Pt. Verbalizes understanding of d/c instructions and follow-up. VS stable and pain controlled per pt.  Pt. In NAD at time of d/c and denies further concerns regarding this visit. Pt. Stable at the time of departure from the unit, departing unit by the safest and most appropriate manner per that pt condition and limitations. Pt advised to return to the ED at any time for emergent concerns, or for new/worsening symptoms.   

## 2016-06-01 DIAGNOSIS — J029 Acute pharyngitis, unspecified: Secondary | ICD-10-CM | POA: Diagnosis not present

## 2016-06-01 DIAGNOSIS — M545 Low back pain: Secondary | ICD-10-CM | POA: Diagnosis not present

## 2016-06-10 ENCOUNTER — Ambulatory Visit: Payer: Medicare Other | Admitting: Urology

## 2016-06-15 ENCOUNTER — Emergency Department: Payer: Medicare Other

## 2016-06-15 ENCOUNTER — Emergency Department
Admission: EM | Admit: 2016-06-15 | Discharge: 2016-06-15 | Disposition: A | Discharge status: Home / Self Care | Payer: Medicare Other | Attending: Emergency Medicine | Admitting: Emergency Medicine

## 2016-06-15 ENCOUNTER — Encounter: Payer: Self-pay | Admitting: Emergency Medicine

## 2016-06-15 DIAGNOSIS — I10 Essential (primary) hypertension: Secondary | ICD-10-CM | POA: Insufficient documentation

## 2016-06-15 DIAGNOSIS — Z79899 Other long term (current) drug therapy: Secondary | ICD-10-CM | POA: Insufficient documentation

## 2016-06-15 DIAGNOSIS — R109 Unspecified abdominal pain: Secondary | ICD-10-CM | POA: Diagnosis not present

## 2016-06-15 DIAGNOSIS — F17290 Nicotine dependence, other tobacco product, uncomplicated: Secondary | ICD-10-CM | POA: Diagnosis not present

## 2016-06-15 DIAGNOSIS — N200 Calculus of kidney: Secondary | ICD-10-CM | POA: Insufficient documentation

## 2016-06-15 LAB — URINALYSIS, COMPLETE (UACMP) WITH MICROSCOPIC
Bacteria, UA: NONE SEEN
Bilirubin Urine: NEGATIVE
GLUCOSE, UA: NEGATIVE mg/dL
Ketones, ur: NEGATIVE mg/dL
LEUKOCYTES UA: NEGATIVE
Nitrite: NEGATIVE
PH: 5 (ref 5.0–8.0)
Protein, ur: NEGATIVE mg/dL
Specific Gravity, Urine: 1.014 (ref 1.005–1.030)

## 2016-06-15 LAB — BASIC METABOLIC PANEL
ANION GAP: 7 (ref 5–15)
BUN: 8 mg/dL (ref 6–20)
CO2: 28 mmol/L (ref 22–32)
Calcium: 9.7 mg/dL (ref 8.9–10.3)
Chloride: 104 mmol/L (ref 101–111)
Creatinine, Ser: 1.08 mg/dL (ref 0.61–1.24)
GFR calc Af Amer: >60 mL/min (ref >60)
GFR calc non Af Amer: >60 mL/min (ref >60)
GLUCOSE: 112 mg/dL — AB (ref 65–99)
Potassium: 3.6 mmol/L (ref 3.5–5.1)
Sodium: 139 mmol/L (ref 135–145)

## 2016-06-15 LAB — CBC
HEMATOCRIT: 41.8 % (ref 40.0–52.0)
HEMOGLOBIN: 14.5 g/dL (ref 13.0–18.0)
MCH: 33 pg (ref 26.0–34.0)
MCHC: 34.8 g/dL (ref 32.0–36.0)
MCV: 94.9 fL (ref 80.0–100.0)
Platelets: 274 10*3/uL (ref 150–440)
RBC: 4.4 MIL/uL (ref 4.40–5.90)
RDW: 13.5 % (ref 11.5–14.5)
WBC: 9.4 10*3/uL (ref 3.8–10.6)

## 2016-06-15 MED ORDER — KETOROLAC TROMETHAMINE 30 MG/ML IJ SOLN
30.0000 mg | Freq: Once | INTRAMUSCULAR | Status: AC
  Administered 2016-06-15: 30 mg via INTRAVENOUS
  Filled 2016-06-15: qty 1

## 2016-06-15 MED ORDER — SODIUM CHLORIDE 0.9 % IV SOLN
Freq: Once | INTRAVENOUS | Status: AC
  Administered 2016-06-15: 07:00:00 via INTRAVENOUS

## 2016-06-15 NOTE — ED Triage Notes (Signed)
Pt states that he has been experienced right flank pain since yesterday. Pt is in pain in triage and feel like something "is moving down more in the groin area". Pt has hx of kidney stones and is ambulatory to triage with NAD at this time.

## 2016-06-15 NOTE — ED Provider Notes (Signed)
[facility] Emergency Department Provider Note        Time seen: ----------------------------------------- 6:53 AM on 06/15/2016 -----------------------------------------    I have reviewed the triage vital signs and the nursing notes.   HISTORY   Chief Complaint Flank Pain    HPI Jorge Nichols is a 26 y.o. male who presents to the ED for right flank pain. Patient has been experiencing right flank pain since yesterday. Patient states it felt like something was moving down into his groin area. He has a history of kidney stones. Patient states he was seen here a month ago and he was not given a diagnosis for his flank pain, initially was thought to be his gallbladder. Patient does not think he pulled a muscle. He denies fever or vomiting or other symptoms.   Past Medical History:  Diagnosis Date  . Anxiety   . Chronic headaches   . Chronic hip pain   . Hematuria   . History of avascular necrosis of capital femoral epiphysis    bilateral hip s/p  replacement's  . Hypertension   . Mixed dyslipidemia   . Nephrolithiasis    bilateral  non-obstuctive per ct 12-25-2015  . Other hyperparathyroidism (HCC)   . Right ureteral stone   . Vitamin D deficiency   . Weak urinary stream     Patient Active Problem List   Diagnosis Date Noted  . Vitamin D deficiency 06/21/2015  . Other hyperparathyroidism (HCC) 06/05/2015  . BP (high blood pressure) 05/13/2015  . Calculus of kidney 05/13/2015  . Arthralgia of hip 12/04/2013  . Iliopsoas bursitis 12/04/2013  . History of repair of hip joint 07/03/2013  . Avascular necrosis of bone (HCC) 03/20/2013  . Mixed dyslipidemia 09/09/2012  . Metabolic syndrome 09/09/2012  . Morbid obesity (HCC) 09/09/2012  . Sleep disorder breathing 09/09/2012  . Family history of premature CAD 09/09/2012    Past Surgical History:  Procedure Laterality Date  . CYSTOSCOPY WITH URETEROSCOPY, STONE BASKETRY AND STENT PLACEMENT  Right 01/02/2016   Procedure: CYSTOSCOPY WITH RIGHT URETEROSCOPY, STONE EXTRACTION  AND STENT PLACEMENT;  Surgeon: Bjorn PippinJohn Wrenn, MD;  [facility] [place];  Service: Urology;  Laterality: Right;  . EXTRACORPOREAL SHOCK WAVE LITHOTRIPSY  yrs ago  . HIP SURGERY Bilateral 2015  . HOLMIUM LASER APPLICATION Right 01/02/2016   Procedure: HOLMIUM LASER APPLICATION;  Surgeon: Bjorn PippinJohn Wrenn, MD;  [facility] [place];  Service: Urology;  Laterality: Right;  . TOTAL HIP ARTHROPLASTY Bilateral 2015  . URETEROLITHOTOMY  x2  last one 2007    Allergies Patient has no known allergies.  Social History Social History  Substance Use Topics  . Smoking status: Former Smoker    Packs/day: 0.50    Years: 15.00  . Smokeless tobacco: Current User    Types: Snuff  . Alcohol use Yes     Comment: occassionally    Review of Systems Constitutional: Negative for fever. Cardiovascular: Negative for chest pain. Respiratory: Negative for shortness of breath. Gastrointestinal: Positive for flank pain Genitourinary: Negative for dysuria. Musculoskeletal: Negative for back pain. Skin: Negative for rash. Neurological: Negative for headaches, focal weakness or numbness.  10-point ROS otherwise negative.  ____________________________________________   PHYSICAL EXAM:  VITAL SIGNS: ED Triage Vitals  Enc Vitals Group     BP 06/15/16 0642 124/78     Pulse Rate 06/15/16 0642 96     Resp 06/15/16 0642 (!) 22     Temp 06/15/16 0642 98.2 F (36.8 C)  Temp Source 06/15/16 0642 Oral     SpO2 06/15/16 0642 100 %     Weight 06/15/16 0643 280 lb (127 kg)     Height 06/15/16 0643 5\' 8"  (1.727 m)     Head Circumference --      Peak Flow --      Pain Score 06/15/16 0643 8     Pain Loc --      Pain Edu? --      Excl. in GC? --     Constitutional: Alert and oriented. Well appearing and in no distress. Eyes: Conjunctivae are normal. PERRL. Normal extraocular movements. ENT    Head: Normocephalic and atraumatic.   Nose: No congestion/rhinnorhea.   Mouth/Throat: Mucous membranes are moist.   Neck: No stridor. Cardiovascular: Normal rate, regular rhythm. No murmurs, rubs, or gallops. Respiratory: Normal respiratory effort without tachypnea nor retractions. Breath sounds are clear and equal bilaterally. No wheezes/rales/rhonchi. Gastrointestinal: Soft and nontender. Normal bowel sounds Musculoskeletal: Nontender with normal range of motion in extremities. No lower extremity tenderness nor edema. Neurologic:  Normal speech and language. No gross focal neurologic deficits are appreciated.  Skin:  Skin is warm, dry and intact. No rash noted. Psychiatric: Mood and affect are normal. Speech and behavior are normal.  ____________________________________________  ED COURSE:  Pertinent labs & imaging results that were available during my care of the patient were reviewed by me and considered in my medical decision making (see chart for details). Patient presents for flank pain, we will assess with labs and imaging if needed.   Procedures ____________________________________________   LABS (pertinent positives/negatives)  Labs Reviewed  URINALYSIS, COMPLETE (UACMP) WITH MICROSCOPIC - Abnormal; Notable for the following:       Result Value   Color, Urine YELLOW (*)    APPearance CLEAR (*)    Hgb urine dipstick MODERATE (*)    Squamous Epithelial / LPF 0-5 (*)    All other components within normal limits  BASIC METABOLIC PANEL - Abnormal; Notable for the following:    Glucose, Bld 112 (*)    All other components within normal limits  CBC    RADIOLOGY  Ultrasound renal IMPRESSION: Bilateral nonobstructing nephrolithiasis. ____________________________________________  FINAL ASSESSMENT AND PLAN  Flank pain  Plan: Patient's labs and imaging were dictated above. Patient had presented for Flank pain of uncertain etiology. No active kidney stones are  identified at this time. He does have hematuria but this is not an unusual finding for him. I will encourage close outpatient follow-up with urology.   Emily Filbert, MD   Note: This note was generated in part or whole with voice recognition software. Voice recognition is usually quite accurate but there are transcription errors that can and very often do occur. I apologize for any typographical errors that were not detected and corrected.     Emily Filbert, MD 06/15/16 [phone]

## 2016-06-29 DIAGNOSIS — I10 Essential (primary) hypertension: Secondary | ICD-10-CM | POA: Diagnosis not present

## 2016-06-29 DIAGNOSIS — M545 Low back pain: Secondary | ICD-10-CM | POA: Diagnosis not present

## 2016-06-29 DIAGNOSIS — E669 Obesity, unspecified: Secondary | ICD-10-CM | POA: Diagnosis not present

## 2016-07-07 DIAGNOSIS — J069 Acute upper respiratory infection, unspecified: Secondary | ICD-10-CM | POA: Diagnosis not present

## 2016-07-07 DIAGNOSIS — R197 Diarrhea, unspecified: Secondary | ICD-10-CM | POA: Diagnosis not present

## 2016-07-18 DIAGNOSIS — M25522 Pain in left elbow: Secondary | ICD-10-CM | POA: Diagnosis not present

## 2016-07-18 DIAGNOSIS — S59902A Unspecified injury of left elbow, initial encounter: Secondary | ICD-10-CM | POA: Diagnosis not present

## 2016-08-03 ENCOUNTER — Encounter (HOSPITAL_COMMUNITY): Payer: Self-pay | Admitting: Emergency Medicine

## 2016-08-03 ENCOUNTER — Emergency Department (HOSPITAL_COMMUNITY)
Admission: EM | Admit: 2016-08-03 | Discharge: 2016-08-04 | Disposition: A | Discharge status: Home / Self Care | Payer: Medicare Other | Attending: Emergency Medicine | Admitting: Emergency Medicine

## 2016-08-03 DIAGNOSIS — R319 Hematuria, unspecified: Secondary | ICD-10-CM | POA: Insufficient documentation

## 2016-08-03 DIAGNOSIS — R300 Dysuria: Secondary | ICD-10-CM | POA: Diagnosis not present

## 2016-08-03 DIAGNOSIS — Z79899 Other long term (current) drug therapy: Secondary | ICD-10-CM | POA: Diagnosis not present

## 2016-08-03 DIAGNOSIS — R109 Unspecified abdominal pain: Secondary | ICD-10-CM | POA: Diagnosis not present

## 2016-08-03 DIAGNOSIS — I10 Essential (primary) hypertension: Secondary | ICD-10-CM | POA: Insufficient documentation

## 2016-08-03 DIAGNOSIS — R110 Nausea: Secondary | ICD-10-CM | POA: Diagnosis not present

## 2016-08-03 DIAGNOSIS — F17290 Nicotine dependence, other tobacco product, uncomplicated: Secondary | ICD-10-CM | POA: Diagnosis not present

## 2016-08-03 LAB — COMPREHENSIVE METABOLIC PANEL
ALBUMIN: 4.1 g/dL (ref 3.5–5.0)
ALK PHOS: 64 U/L (ref 38–126)
ALT: 50 U/L (ref 17–63)
AST: 41 U/L (ref 15–41)
Anion gap: 8 (ref 5–15)
BILIRUBIN TOTAL: 0.4 mg/dL (ref 0.3–1.2)
BUN: 5 mg/dL — AB (ref 6–20)
CO2: 22 mmol/L (ref 22–32)
Calcium: 8.9 mg/dL (ref 8.9–10.3)
Chloride: 109 mmol/L (ref 101–111)
Creatinine, Ser: 1.03 mg/dL (ref 0.61–1.24)
GFR calc Af Amer: >60 mL/min (ref >60)
GFR calc non Af Amer: >60 mL/min (ref >60)
GLUCOSE: 99 mg/dL (ref 65–99)
POTASSIUM: 3.1 mmol/L — AB (ref 3.5–5.1)
Sodium: 139 mmol/L (ref 135–145)
TOTAL PROTEIN: 7 g/dL (ref 6.5–8.1)

## 2016-08-03 LAB — CBC WITH DIFFERENTIAL/PLATELET
BASOS ABS: 0.1 10*3/uL (ref 0.0–0.1)
BASOS PCT: 1 %
Eosinophils Absolute: 0.3 10*3/uL (ref 0.0–0.7)
Eosinophils Relative: 3 %
HEMATOCRIT: 45.2 % (ref 39.0–52.0)
HEMOGLOBIN: 15.7 g/dL (ref 13.0–17.0)
Lymphocytes Relative: 25 %
Lymphs Abs: 2.3 10*3/uL (ref 0.7–4.0)
MCH: 33.3 pg (ref 26.0–34.0)
MCHC: 34.7 g/dL (ref 30.0–36.0)
MCV: 96 fL (ref 78.0–100.0)
Monocytes Absolute: 0.9 10*3/uL (ref 0.1–1.0)
Monocytes Relative: 10 %
NEUTROS ABS: 5.8 10*3/uL (ref 1.7–7.7)
NEUTROS PCT: 61 %
Platelets: 274 10*3/uL (ref 150–400)
RBC: 4.71 MIL/uL (ref 4.22–5.81)
RDW: 12.5 % (ref 11.5–15.5)
WBC: 9.2 10*3/uL (ref 4.0–10.5)

## 2016-08-03 LAB — URINALYSIS, ROUTINE W REFLEX MICROSCOPIC
Bacteria, UA: NONE SEEN
Bilirubin Urine: NEGATIVE
Glucose, UA: NEGATIVE mg/dL
KETONES UR: NEGATIVE mg/dL
Leukocytes, UA: NEGATIVE
Nitrite: NEGATIVE
PROTEIN: NEGATIVE mg/dL
Specific Gravity, Urine: 1.016 (ref 1.005–1.030)
pH: 5 (ref 5.0–8.0)

## 2016-08-03 MED ORDER — KETOROLAC TROMETHAMINE 60 MG/2ML IM SOLN
60.0000 mg | Freq: Once | INTRAMUSCULAR | Status: AC
  Administered 2016-08-03: 60 mg via INTRAMUSCULAR
  Filled 2016-08-03: qty 2

## 2016-08-03 MED ORDER — OXYCODONE-ACETAMINOPHEN 5-325 MG PO TABS
1.0000 | ORAL_TABLET | Freq: Once | ORAL | Status: AC
  Administered 2016-08-03: 1 via ORAL
  Filled 2016-08-03: qty 1

## 2016-08-03 MED ORDER — ONDANSETRON 8 MG PO TBDP
8.0000 mg | ORAL_TABLET | Freq: Once | ORAL | Status: AC
  Administered 2016-08-03: 8 mg via ORAL
  Filled 2016-08-03: qty 1

## 2016-08-03 NOTE — ED Triage Notes (Signed)
Pt c/o flank pain and groin pain x 3 days.

## 2016-08-03 NOTE — ED Notes (Signed)
Pt has been informed of need for urine sample, per [employer] Data Systems[employer]y RN. Was informed to let nursing staff know when able to provide one.

## 2016-08-04 NOTE — ED Notes (Signed)
Pt wanted pain meds to take home, edpa notified and no orders were received.

## 2016-08-04 NOTE — Discharge Instructions (Signed)
Strain all urine.  Call your urologist tomorrow to arrange a follow-up appt.

## 2016-08-08 NOTE — ED Provider Notes (Signed)
[facility]-EMERGENCY DEPT Provider Note   CSN: [id number] Arrival date & time: 08/03/16  2107     History   Chief Complaint Chief Complaint  Patient presents with  . Flank Pain    HPI Jorge Nichols is a 26 y.o. male.  HPI  Jorge Nichols is a 26 y.o. male with hx of kidney stones,  presents to the Emergency Department complaining of right flank pain for 3 days.  He states pain is sharp and radiates to his right groin.  Pain is associated with urination and he states feels similar to previous kidney stone pain.  Symptoms associated with nausea.  He denies hematuria, fever, vomiting or urinary retention.    Past Medical History:  Diagnosis Date  . Anxiety   . Chronic headaches   . Chronic hip pain   . Hematuria   . History of avascular necrosis of capital femoral epiphysis    bilateral hip s/p  replacement's  . Hypertension   . Mixed dyslipidemia   . Nephrolithiasis    bilateral  non-obstuctive per ct 12-25-2015  . Other hyperparathyroidism (HCC)   . Right ureteral stone   . Vitamin D deficiency   . Weak urinary stream     Patient Active Problem List   Diagnosis Date Noted  . Vitamin D deficiency 06/21/2015  . Other hyperparathyroidism (HCC) 06/05/2015  . BP (high blood pressure) 05/13/2015  . Calculus of kidney 05/13/2015  . Arthralgia of hip 12/04/2013  . Iliopsoas bursitis 12/04/2013  . History of repair of hip joint 07/03/2013  . Avascular necrosis of bone (HCC) 03/20/2013  . Mixed dyslipidemia 09/09/2012  . Metabolic syndrome 09/09/2012  . Morbid obesity (HCC) 09/09/2012  . Sleep disorder breathing 09/09/2012  . Family history of premature CAD 09/09/2012    Past Surgical History:  Procedure Laterality Date  . CYSTOSCOPY WITH URETEROSCOPY, STONE BASKETRY AND STENT PLACEMENT Right 01/02/2016   Procedure: CYSTOSCOPY WITH RIGHT URETEROSCOPY, STONE EXTRACTION  AND STENT PLACEMENT;  Surgeon: Bjorn Pippin, MD;  Location: [facility];  Service: Urology;   Laterality: Right;  . EXTRACORPOREAL SHOCK WAVE LITHOTRIPSY  yrs ago  . HIP SURGERY Bilateral 2015  . HOLMIUM LASER APPLICATION Right 01/02/2016   Procedure: HOLMIUM LASER APPLICATION;  Surgeon: Bjorn Pippin, MD;  Location: [facility];  Service: Urology;  Laterality: Right;  . TOTAL HIP ARTHROPLASTY Bilateral 2015  . URETEROLITHOTOMY  x2  last one 2007       Home Medications    Prior to Admission medications   Medication Sig Start Date End Date Taking? Authorizing Provider  ALPRAZolam Prudy Feeler) 0.5 MG tablet Take 0.5 mg by mouth 2 (two) times daily as needed. 08/01/16  Yes [provider]  baclofen (LIORESAL) 20 MG tablet Take 20 mg by mouth 2 (two) times daily as needed. 07/30/16  Yes [provider]  HYDROcodone-acetaminophen (NORCO/VICODIN) 5-325 MG tablet Take 1 tablet by mouth every 6 (six) hours as needed.   Yes [provider]  metoprolol (LOPRESSOR) 50 MG tablet Take 50 mg by mouth 2 (two) times daily.   Yes [provider]  potassium citrate (UROCIT-K) 10 MEQ (1080 MG) SR tablet Take 10 mEq by mouth 3 (three) times daily. 07/02/15  Yes [provider]  tamsulosin (FLOMAX) 0.4 MG CAPS capsule Take 1 capsule (0.4 mg total) by mouth daily. 02/03/16  Yes Pollina, Canary Brim, MD  Vitamin D, Ergocalciferol, (DRISDOL) 50000 units CAPS capsule Take 1 capsule (50,000 Units total) by mouth every  7 (seven) days. 10/23/15  Yes Roma KayserNida, Gebreselassie W, MD    Family History Family History  Problem Relation Age of Onset  . Hypertension Father     Social History Social History  Substance Use Topics  . Smoking status: Former Smoker    Packs/day: 0.50    Years: 15.00  . Smokeless tobacco: Current User    Types: Snuff  . Alcohol use Yes     Comment: occassionally     Allergies   Patient has no known allergies.   Review of Systems Review of Systems  Constitutional: Negative for activity change, appetite change, chills and  fever.  Respiratory: Negative for chest tightness and shortness of breath.   Cardiovascular: Negative for chest pain.  Gastrointestinal: Positive for nausea. Negative for abdominal pain and vomiting.  Genitourinary: Positive for dysuria, flank pain and hematuria. Negative for decreased urine volume, difficulty urinating, frequency and urgency.  Musculoskeletal: Negative for back pain.  Skin: Negative for rash.  Neurological: Negative for dizziness, weakness and numbness.  Hematological: Negative for adenopathy.  Psychiatric/Behavioral: Negative for confusion.  All other systems reviewed and are negative.    Physical Exam Updated Vital Signs BP 116/79   Pulse 72   Temp 98.1 F (36.7 C)   Resp 18   Ht 5\' 7"  (1.702 m)   Wt 263 lb (119.3 kg)   SpO2 99%   BMI 41.19 kg/m   Physical Exam  Constitutional: He is oriented to person, place, and time. He appears well-developed and well-nourished. No distress.  HENT:  Head: Normocephalic and atraumatic.  Mouth/Throat: Oropharynx is clear and moist.  Cardiovascular: Normal rate, regular rhythm and normal heart sounds.   No murmur heard. Pulmonary/Chest: Effort normal and breath sounds normal. No respiratory distress. He has no wheezes. He has no rales.  Abdominal: Soft. Normal appearance. He exhibits no distension and no mass. There is no hepatosplenomegaly. There is no tenderness. There is no rigidity, no rebound, no guarding, no CVA tenderness and no tenderness at McBurney's point.  Mild right CVA tenderness  Musculoskeletal: Normal range of motion. He exhibits no edema.  Neurological: He is alert and oriented to person, place, and time. Coordination normal.  Skin: Skin is warm and dry. No rash noted.  Psychiatric: He has a normal mood and affect.  Nursing note and vitals reviewed.    ED Treatments / Results  Labs (all labs ordered are listed, but only abnormal results are displayed) Labs Reviewed  URINALYSIS, ROUTINE W REFLEX  MICROSCOPIC - Abnormal; Notable for the following:       Result Value   Hgb urine dipstick LARGE (*)    Squamous Epithelial / LPF 0-5 (*)    All other components within normal limits  COMPREHENSIVE METABOLIC PANEL - Abnormal; Notable for the following:    Potassium 3.1 (*)    BUN 5 (*)    All other components within normal limits  CBC WITH DIFFERENTIAL/PLATELET    EKG  EKG Interpretation None       Radiology No results found.  Procedures Procedures (including critical care time)  Medications Ordered in ED Medications  oxyCODONE-acetaminophen (PERCOCET/ROXICET) 5-325 MG per tablet 1 tablet (1 tablet Oral Given 08/03/16 2200)  ondansetron (ZOFRAN-ODT) disintegrating tablet 8 mg (8 mg Oral Given 08/03/16 2312)  ketorolac (TORADOL) injection 60 mg (60 mg Intramuscular Given 08/03/16 2313)  oxyCODONE-acetaminophen (PERCOCET/ROXICET) 5-325 MG per tablet 1 tablet (1 tablet Oral Given 08/03/16 2312)     Initial Impression / Assessment and Plan /  ED Course  I have reviewed the triage vital signs and the nursing notes.  Pertinent labs & imaging results that were available during my care of the patient were reviewed by me and considered in my medical decision making (see chart for details).     Pt well appearing.  Vitals stable.  Multiple ED visits for same and multiple imaging.  Had renal US 06/15/16 that revealed bilateral nephrolithiasis w/o urethral stone.  Pt followed by urology.  Prefers to arrange f/u.  Appears stable for d/c.  Agrees to strain urine and arrange f/u.  Return precautions discussed.  Final Clinical Impressions(s) / ED Diagnoses   Final diagnoses:  Right flank pain    New Prescriptions Discharge Medication List as of 08/04/2016 12:10 AM       Pauline Aus, PA-C 08/08/16 1119    Bethann Berkshire, MD 08/10/16 1453

## 2016-08-12 ENCOUNTER — Emergency Department (HOSPITAL_COMMUNITY)
Admission: EM | Admit: 2016-08-12 | Discharge: 2016-08-12 | Disposition: A | Discharge status: Home / Self Care | Payer: Medicare Other | Attending: Emergency Medicine | Admitting: Emergency Medicine

## 2016-08-12 ENCOUNTER — Encounter (HOSPITAL_COMMUNITY): Payer: Self-pay | Admitting: *Deleted

## 2016-08-12 ENCOUNTER — Emergency Department (HOSPITAL_COMMUNITY): Payer: Medicare Other

## 2016-08-12 DIAGNOSIS — N23 Unspecified renal colic: Secondary | ICD-10-CM

## 2016-08-12 DIAGNOSIS — Z79899 Other long term (current) drug therapy: Secondary | ICD-10-CM | POA: Insufficient documentation

## 2016-08-12 DIAGNOSIS — Z87891 Personal history of nicotine dependence: Secondary | ICD-10-CM | POA: Diagnosis not present

## 2016-08-12 DIAGNOSIS — R109 Unspecified abdominal pain: Secondary | ICD-10-CM | POA: Diagnosis not present

## 2016-08-12 DIAGNOSIS — I10 Essential (primary) hypertension: Secondary | ICD-10-CM | POA: Insufficient documentation

## 2016-08-12 DIAGNOSIS — R1030 Lower abdominal pain, unspecified: Secondary | ICD-10-CM | POA: Diagnosis present

## 2016-08-12 LAB — URINALYSIS, ROUTINE W REFLEX MICROSCOPIC
BACTERIA UA: NONE SEEN
Bilirubin Urine: NEGATIVE
Glucose, UA: NEGATIVE mg/dL
KETONES UR: NEGATIVE mg/dL
Leukocytes, UA: NEGATIVE
Nitrite: NEGATIVE
PH: 5 (ref 5.0–8.0)
PROTEIN: NEGATIVE mg/dL
Specific Gravity, Urine: 1.017 (ref 1.005–1.030)

## 2016-08-12 MED ORDER — ONDANSETRON 8 MG PO TBDP
8.0000 mg | ORAL_TABLET | Freq: Once | ORAL | Status: AC
  Administered 2016-08-12: 8 mg via ORAL
  Filled 2016-08-12: qty 1

## 2016-08-12 MED ORDER — OXYCODONE-ACETAMINOPHEN 5-325 MG PO TABS
2.0000 | ORAL_TABLET | Freq: Once | ORAL | Status: AC
  Administered 2016-08-12: 2 via ORAL
  Filled 2016-08-12: qty 2

## 2016-08-12 MED ORDER — KETOROLAC TROMETHAMINE 60 MG/2ML IM SOLN
60.0000 mg | Freq: Once | INTRAMUSCULAR | Status: AC
  Administered 2016-08-12: 60 mg via INTRAMUSCULAR
  Filled 2016-08-12: qty 2

## 2016-08-12 NOTE — Discharge Instructions (Signed)
Continue to strain your urine.  Try calling [facility] to see if they would see you for follow-up even before your insurance goes into effect

## 2016-08-12 NOTE — ED Provider Notes (Signed)
[facility]-EMERG[facility]Y DEPT Provider Note   CSN: [id number] Arrival date & time: 08/12/16  8295     History   Chief Complaint Chief Complaint  Patient presents with  . Flank Pain    HPI Jorge Nichols is a 26 y.o. male.  HPI   Jorge Nichols is a 26 y.o. male with hx of kidney stones, presents to the Emergency Department complaining of sudden onset of bilateral flank pain last evening associated with nausea, hematuria and burning with urination.  States he strained his urine and passed three small stones this morning that he brought with him in a small container.  Since passing the stones, he states the pain has moved to his pubic area and has remained constant.  Continues to have nausea.  He was seen here 08/03/16 for similar symptoms that he reports resolved until yesterday.  He denies vomiting, abdominal pain, fever, and chills.  He is followed by [facility]   Past Medical History:  Diagnosis Date  . Anxiety   . Chronic headaches   . Chronic hip pain   . Hematuria   . History of avascular necrosis of capital femoral epiphysis    bilateral hip s/p  replacement's  . Hypertension   . Mixed dyslipidemia   . Nephrolithiasis    bilateral  non-obstuctive per ct 12-25-2015  . Other hyperparathyroidism (HCC)   . Right ureteral stone   . Vitamin D deficiency   . Weak urinary stream     Patient Active Problem List   Diagnosis Date Noted  . Vitamin D deficiency 06/21/2015  . Other hyperparathyroidism (HCC) 06/05/2015  . BP (high blood pressure) 05/13/2015  . Calculus of kidney 05/13/2015  . Arthralgia of hip 12/04/2013  . Iliopsoas bursitis 12/04/2013  . History of repair of hip joint 07/03/2013  . Avascular necrosis of bone (HCC) 03/20/2013  . Mixed dyslipidemia 09/09/2012  . Metabolic syndrome 09/09/2012  . Morbid obesity (HCC) 09/09/2012  . Sleep disorder breathing 09/09/2012  . Family history of premature CAD 09/09/2012    Past Surgical History:  Procedure  Laterality Date  . CYSTOSCOPY WITH URETEROSCOPY, STONE BASKETRY AND STENT PLACEMENT Right 01/02/2016   Procedure: CYSTOSCOPY WITH RIGHT URETEROSCOPY, STONE EXTRACTION  AND STENT PLACEMENT;  Surgeon: Bjorn Pippin, MD;  [facility]: [facility];  Service: Urology;  Laterality: Right;  . EXTRACORPOREAL SHOCK WAVE LITHOTRIPSY  yrs ago  . HIP SURGERY Bilateral 2015  . HOLMIUM LASER APPLICATION Right 01/02/2016   Procedure: HOLMIUM LASER APPLICATION;  Surgeon: Bjorn Pippin, MD;  Location: [facility];  Service: Urology;  Laterality: Right;  . TOTAL HIP ARTHROPLASTY Bilateral 2015  . URETEROLITHOTOMY  x2  last one 2007       Home Medications    Prior to Admission medications   Medication Sig Start Date End Date Taking? Authorizing Provider  ALPRAZolam Prudy Feeler) 0.5 MG tablet Take 0.5 mg by mouth 2 (two) times daily as needed. 08/01/16   [provider]  baclofen (LIORESAL) 20 MG tablet Take 20 mg by mouth 2 (two) times daily as needed. 07/30/16   [provider]  HYDROcodone-acetaminophen (NORCO/VICODIN) 5-325 MG tablet Take 1 tablet by mouth every 6 (six) hours as needed.    [provider]  metoprolol (LOPRESSOR) 50 MG tablet Take 50 mg by mouth 2 (two) times daily.    [provider]  potassium citrate (UROCIT-K) 10 MEQ (1080 MG) SR tablet Take 10 mEq by mouth 3 (three) times daily. 07/02/15  [provider]  tamsulosin (FLOMAX) 0.4 MG CAPS capsule Take 1 capsule (0.4 mg total) by mouth daily. 02/03/16   Gilda CreasePollina, Christopher J, MD  Vitamin D, Ergocalciferol, (DRISDOL) 50000 units CAPS capsule Take 1 capsule (50,000 Units total) by mouth every 7 (seven) days. 10/23/15   Roma KayserNida, Gebreselassie W, MD    Family History Family History  Problem Relation Age of Onset  . Hypertension Father     Social History Social History  Substance Use Topics  . Smoking status: Former Smoker    Packs/day: 0.50    Years: 15.00  . Smokeless  tobacco: Current User    Types: Snuff  . Alcohol use Yes     Comment: occassionally     Allergies   Patient has no known allergies.   Review of Systems Review of Systems  Constitutional: Negative for activity change, appetite change, chills and fever.  Respiratory: Negative for chest tightness and shortness of breath.   Gastrointestinal: Positive for nausea. Negative for abdominal pain and vomiting.       Lower abdominal pain  Genitourinary: Positive for dysuria, flank pain and hematuria. Negative for decreased urine volume, difficulty urinating, discharge, frequency, penile swelling, scrotal swelling, testicular pain and urgency.  Musculoskeletal: Negative for back pain.  Skin: Negative for rash.  Neurological: Negative for dizziness, weakness and numbness.  Hematological: Negative for adenopathy.  Psychiatric/Behavioral: Negative for confusion.  All other systems reviewed and are negative.    Physical Exam Updated Vital Signs BP (!) 151/97 (BP Location: Left Arm)   Pulse 92   Temp 98.1 F (36.7 C) (Oral)   Resp (!) 22   Ht 5\' 8"  (1.727 m)   Wt 122.5 kg (270 lb)   SpO2 99%   BMI 41.05 kg/m   Physical Exam  Constitutional: He is oriented to person, place, and time. He appears well-developed and well-nourished. No distress.  HENT:  Head: Normocephalic and atraumatic.  Mouth/Throat: Oropharynx is clear and moist.  Cardiovascular: Normal rate, regular rhythm and normal heart sounds.   No murmur heard. Pulmonary/Chest: Effort normal and breath sounds normal. No respiratory distress. He has no wheezes. He has no rales.  Abdominal: Soft. Normal appearance. He exhibits no distension and no mass. There is no hepatosplenomegaly. There is tenderness in the suprapubic area. There is no rigidity, no rebound, no guarding, no CVA tenderness and no tenderness at McBurney's point.  Mild ttp of the suprapubic region.  Remaining abdomen is soft, non-tender without guarding or rebound  tenderness. No CVA tenderness  Musculoskeletal: Normal range of motion. He exhibits no edema.  Neurological: He is alert and oriented to person, place, and time. Coordination normal.  Skin: Skin is warm and dry. No rash noted.  Nursing note and vitals reviewed.    ED Treatments / Results  Labs (all labs ordered are listed, but only abnormal results are displayed) Labs Reviewed  URINALYSIS, ROUTINE W REFLEX MICROSCOPIC - Abnormal; Notable for the following:       Result Value   Hgb urine dipstick SMALL (*)    Squamous Epithelial / LPF 0-5 (*)    All other components within normal limits    EKG  EKG Interpretation None       Radiology Dg Abdomen 1 View  Result Date: 08/12/2016 CLINICAL DATA:  Right flank and groin pain. EXAM: ABDOMEN - 1 VIEW COMPARISON:  03/27/2016 FINDINGS: There is a non obstructive bowel gas pattern. No supine evidence of free air. No organomegaly or suspicious calcification. No acute  bony abnormality. Bilateral hip replacements noted. IMPRESSION: No acute findings. Electronically Signed   By: Charlett Nose M.D.   On: 08/12/2016 11:07      Procedures Procedures (including critical care time)  Medications Ordered in ED Medications  ketorolac (TORADOL) injection 60 mg (not administered)  oxyCODONE-acetaminophen (PERCOCET/ROXICET) 5-325 MG per tablet 2 tablet (not administered)  ondansetron (ZOFRAN-ODT) disintegrating tablet 8 mg (not administered)     Initial Impression / Assessment and Plan / ED Course  I have reviewed the triage vital signs and the nursing notes.  Pertinent labs & imaging results that were available during my care of the patient were reviewed by me and considered in my medical decision making (see chart for details).    Pt with multiple renal imaging and frequent ER visits for flank pain. Pain similar to previous kidney stones and has three small fragments in a small container that appear to be kidney stones.   Pt waiting for his  health insurance to go into effect June 12 to f/u with urology.  Pain improved, feeling better and ready for d/c.  Return precautions discussed.   Final Clinical Impressions(s) / ED Diagnoses   Final diagnoses:  Renal colic    New Prescriptions New Prescriptions   No medications on file     Pauline Aus, Cordelia Poche 08/14/16 2226    Samuel Jester, DO 08/15/16 1451

## 2016-08-12 NOTE — ED Triage Notes (Signed)
Pt comes in with right sided flank pain starting yesterday. Pt states he has passed 3 stones this morning. He has these upon arrival.

## 2016-09-02 DIAGNOSIS — R110 Nausea: Secondary | ICD-10-CM | POA: Diagnosis not present

## 2016-09-02 DIAGNOSIS — R1031 Right lower quadrant pain: Secondary | ICD-10-CM | POA: Diagnosis not present

## 2016-09-02 DIAGNOSIS — N200 Calculus of kidney: Secondary | ICD-10-CM | POA: Diagnosis not present

## 2016-09-02 DIAGNOSIS — R109 Unspecified abdominal pain: Secondary | ICD-10-CM | POA: Diagnosis not present

## 2016-09-02 DIAGNOSIS — R3129 Other microscopic hematuria: Secondary | ICD-10-CM | POA: Diagnosis not present

## 2016-09-17 DIAGNOSIS — M545 Low back pain: Secondary | ICD-10-CM | POA: Diagnosis not present

## 2016-09-17 DIAGNOSIS — B029 Zoster without complications: Secondary | ICD-10-CM | POA: Diagnosis not present

## 2016-09-17 DIAGNOSIS — E669 Obesity, unspecified: Secondary | ICD-10-CM | POA: Diagnosis not present

## 2016-09-17 DIAGNOSIS — I10 Essential (primary) hypertension: Secondary | ICD-10-CM | POA: Diagnosis not present

## 2016-10-09 DIAGNOSIS — M545 Low back pain: Secondary | ICD-10-CM | POA: Diagnosis not present

## 2016-10-09 DIAGNOSIS — M25559 Pain in unspecified hip: Secondary | ICD-10-CM | POA: Diagnosis not present

## 2016-10-09 DIAGNOSIS — K219 Gastro-esophageal reflux disease without esophagitis: Secondary | ICD-10-CM | POA: Diagnosis not present

## 2016-10-09 DIAGNOSIS — I10 Essential (primary) hypertension: Secondary | ICD-10-CM | POA: Diagnosis not present

## 2016-10-15 ENCOUNTER — Encounter: Payer: Self-pay | Admitting: *Deleted

## 2016-10-15 ENCOUNTER — Emergency Department
Admission: EM | Admit: 2016-10-15 | Discharge: 2016-10-16 | Disposition: A | Discharge status: Home / Self Care | Payer: BLUE CROSS/BLUE SHIELD | Attending: Emergency Medicine | Admitting: Emergency Medicine

## 2016-10-15 DIAGNOSIS — R079 Chest pain, unspecified: Secondary | ICD-10-CM | POA: Diagnosis not present

## 2016-10-15 DIAGNOSIS — I10 Essential (primary) hypertension: Secondary | ICD-10-CM | POA: Insufficient documentation

## 2016-10-15 DIAGNOSIS — R002 Palpitations: Secondary | ICD-10-CM | POA: Diagnosis not present

## 2016-10-15 DIAGNOSIS — F17290 Nicotine dependence, other tobacco product, uncomplicated: Secondary | ICD-10-CM | POA: Diagnosis not present

## 2016-10-15 LAB — CBC
HEMATOCRIT: 45.1 % (ref 40.0–52.0)
Hemoglobin: 15.6 g/dL (ref 13.0–18.0)
MCH: 33.1 pg (ref 26.0–34.0)
MCHC: 34.7 g/dL (ref 32.0–36.0)
MCV: 95.4 fL (ref 80.0–100.0)
PLATELETS: 253 10*3/uL (ref 150–440)
RBC: 4.73 MIL/uL (ref 4.40–5.90)
RDW: 13.4 % (ref 11.5–14.5)
WBC: 7.9 10*3/uL (ref 3.8–10.6)

## 2016-10-15 LAB — BASIC METABOLIC PANEL
Anion gap: 9 (ref 5–15)
BUN: 10 mg/dL (ref 6–20)
CO2: 23 mmol/L (ref 22–32)
CREATININE: 1.03 mg/dL (ref 0.61–1.24)
Calcium: 9.4 mg/dL (ref 8.9–10.3)
Chloride: 108 mmol/L (ref 101–111)
GFR calc Af Amer: >60 mL/min (ref >60)
GFR calc non Af Amer: >60 mL/min (ref >60)
Glucose, Bld: 102 mg/dL — ABNORMAL HIGH (ref 65–99)
POTASSIUM: 3.6 mmol/L (ref 3.5–5.1)
SODIUM: 140 mmol/L (ref 135–145)

## 2016-10-15 LAB — TROPONIN I: Troponin I: <0.03 ng/mL (ref <0.03)

## 2016-10-15 NOTE — ED Triage Notes (Signed)
Pt reports chest pain for 1 week  Intermittent pain.  No sob. No n/v/d.  nonradiating pain.   No pain now in triage.   Pt alert

## 2016-10-16 ENCOUNTER — Emergency Department: Payer: BLUE CROSS/BLUE SHIELD

## 2016-10-16 DIAGNOSIS — R079 Chest pain, unspecified: Secondary | ICD-10-CM | POA: Diagnosis not present

## 2016-10-16 LAB — TROPONIN I

## 2016-10-16 LAB — FIBRIN DERIVATIVES D-DIMER (ARMC ONLY): FIBRIN DERIVATIVES D-DIMER (ARMC): 91.24 (ref 0.00–499.00)

## 2016-10-16 NOTE — ED Provider Notes (Signed)
[facility] Emergency Department Provider Note    First MD Initiated Contact with Patient 10/15/16 2349     (approximate)  I have reviewed the triage vital signs and the nursing notes.   HISTORY  Chief Complaint Chest Pain    HPI Jorge Nichols is a 26 y.o. male with below list of chronic medical conditions presents to the emergency department with a one-week history of intermittent sharp left-sided chest pain that is nonradiating. Patient denies any dyspnea no nausea or vomiting or diaphoresis. Patient states pain occurred tonight while walking into work he stated that he felt like his heart was really racing". Patient states occasionally his heart does race but then will subsequently resolve on its own. Patient denies any immediate family history of CAD however does admit to the fact that his sister has SVT. Patient does have a history of anxiety and asking could this possibly been a panic attack". Patient denies any personal family history of DVT or PE.   Past Medical History:  Diagnosis Date  . Anxiety   . Chronic headaches   . Chronic hip pain   . Hematuria   . History of avascular necrosis of capital femoral epiphysis    bilateral hip s/p  replacement's  . Hypertension   . Mixed dyslipidemia   . Nephrolithiasis    bilateral  non-obstuctive per ct 12-25-2015  . Other hyperparathyroidism (HCC)   . Right ureteral stone   . Vitamin D deficiency   . Weak urinary stream     Patient Active Problem List   Diagnosis Date Noted  . Vitamin D deficiency 06/21/2015  . Other hyperparathyroidism (HCC) 06/05/2015  . BP (high blood pressure) 05/13/2015  . Calculus of kidney 05/13/2015  . Arthralgia of hip 12/04/2013  . Iliopsoas bursitis 12/04/2013  . History of repair of hip joint 07/03/2013  . Avascular necrosis of bone (HCC) 03/20/2013  . Mixed dyslipidemia 09/09/2012  . Metabolic syndrome 09/09/2012  . Morbid obesity (HCC) 09/09/2012  . Sleep  disorder breathing 09/09/2012  . Family history of premature CAD 09/09/2012    Past Surgical History:  Procedure Laterality Date  . CYSTOSCOPY WITH URETEROSCOPY, STONE BASKETRY AND STENT PLACEMENT Right 01/02/2016   Procedure: CYSTOSCOPY WITH RIGHT URETEROSCOPY, STONE EXTRACTION  AND STENT PLACEMENT;  Surgeon: Bjorn PippinJohn Wrenn, MD;  [facility] [place];  Service: Urology;  Laterality: Right;  . EXTRACORPOREAL SHOCK WAVE LITHOTRIPSY  yrs ago  . HIP SURGERY Bilateral 2015  . HOLMIUM LASER APPLICATION Right 01/02/2016   Procedure: HOLMIUM LASER APPLICATION;  Surgeon: Bjorn PippinJohn Wrenn, MD;  [facility] [place];  Service: Urology;  Laterality: Right;  . TOTAL HIP ARTHROPLASTY Bilateral 2015  . URETEROLITHOTOMY  x2  last one 2007    Prior to Admission medications   Medication Sig Start Date End Date Taking? Authorizing Provider  ALPRAZolam Prudy Feeler(XANAX) 0.5 MG tablet Take 0.5 mg by mouth 2 (two) times daily as needed for anxiety.  08/01/16   [provider]  Aspirin-Salicylamide-Caffeine (ARTHRITIS STRENGTH BC POWDER PO) Take 1 Package by mouth daily as needed (pain).    [provider]  baclofen (LIORESAL) 20 MG tablet Take 20 mg by mouth 2 (two) times daily as needed for muscle spasms.  07/30/16   [provider]  HYDROcodone-acetaminophen (NORCO/VICODIN) 5-325 MG tablet Take 1 tablet by mouth every 6 (six) hours as needed for moderate pain.     [provider]  metoprolol (LOPRESSOR) 50 MG tablet Take 50 mg  by mouth 2 (two) times daily.    [provider]  potassium citrate (UROCIT-K) 10 MEQ (1080 MG) SR tablet Take 10 mEq by mouth 3 (three) times daily. 07/02/15   [provider]  tamsulosin (FLOMAX) 0.4 MG CAPS capsule Take 1 capsule (0.4 mg total) by mouth daily. 02/03/16   Gilda Crease, MD  Vitamin D, Ergocalciferol, (DRISDOL) 50000 units CAPS capsule Take 1 capsule (50,000 Units total) by mouth every 7 (seven)  days. Patient not taking: Reported on 08/12/2016 10/23/15   Roma Kayser, MD    Allergies No known drug allergies  Family History  Problem Relation Age of Onset  . Hypertension Father     Social History Social History  Substance Use Topics  . Smoking status: Former Smoker    Packs/day: 0.50    Years: 15.00  . Smokeless tobacco: Current User    Types: Snuff  . Alcohol use Yes     Comment: occassionally    Review of Systems Constitutional: No fever/chills Eyes: No visual changes. ENT: No sore throat. Cardiovascular:Positive for chest pain and "heart racing" Respiratory: Denies shortness of breath. Gastrointestinal: No abdominal pain.  No nausea, no vomiting.  No diarrhea.  No constipation. Genitourinary: Negative for dysuria. Musculoskeletal: Negative for neck pain.  Negative for back pain. Integumentary: Negative for rash. Neurological: Negative for headaches, focal weakness or numbness.   ____________________________________________   PHYSICAL EXAM:  VITAL SIGNS: ED Triage Vitals  Enc Vitals Group     BP 10/15/16 2151 139/82     Pulse Rate 10/15/16 2151 100     Resp 10/15/16 2151 18     Temp 10/15/16 2151 99 F (37.2 C)     Temp Source 10/15/16 2151 Oral     SpO2 10/15/16 2151 99 %     Weight 10/15/16 2152 112.5 kg (248 lb)     Height 10/15/16 2152 1.727 m (5\' 8" )     Head Circumference --      Peak Flow --      Pain Score 10/15/16 2151 0     Pain Loc --      Pain Edu? --      Excl. in GC? --     Constitutional: Alert and oriented. Well appearing and in no acute distress. Eyes: Conjunctivae are normal.  Head: Atraumatic. Mouth/Throat: Mucous membranes are moist.  Oropharynx non-erythematous. Neck: No stridor.   Cardiovascular: Normal rate, regular rhythm. Good peripheral circulation. Grossly normal heart sounds. Respiratory: Normal respiratory effort.  No retractions. Lungs CTAB. Gastrointestinal: Soft and nontender. No distention.    Musculoskeletal: No lower extremity tenderness nor edema. No gross deformities of extremities. Neurologic:  Normal speech and language. No gross focal neurologic deficits are appreciated.  Skin:  Skin is warm, dry and intact. No rash noted. Psychiatric: Mood and affect are normal. Speech and behavior are normal.  ____________________________________________   LABS (all labs ordered are listed, but only abnormal results are displayed)  Labs Reviewed  BASIC METABOLIC PANEL - Abnormal; Notable for the following:       Result Value   Glucose, Bld 102 (*)    All other components within normal limits  CBC  TROPONIN I  TROPONIN I  FIBRIN DERIVATIVES D-DIMER ([facility] ONLY)   ____________________________________________  EKG  ED ECG REPORT I, [place] N BROWN, the attending physician, personally viewed and interpreted this ECG.   Date: 10/16/2016  EKG Time: 9:47 PM  Rate: 89  Rhythm: Normal sinus rhythm  Axis: Normal  Intervals: Normal  ST&T Change: None  ____________________________________________  RADIOLOGY I, [place] N BROWN, personally viewed and evaluated these images (plain radiographs) as part of my medical decision making, as well as reviewing the written report by the radiologist.  Dg Chest Portable 1 View  Result Date: 10/16/2016 CLINICAL DATA:  Chest pain for 1 week. History of hypertension. Former smoker. EXAM: PORTABLE CHEST 1 VIEW COMPARISON:  10/05/2013 FINDINGS: The heart size and mediastinal contours are within normal limits. Both lungs are clear. The visualized skeletal structures are unremarkable. IMPRESSION: No active disease. Electronically Signed   By: Burman NievesWilliam  Stevens M.D.   On: 10/16/2016 00:47      Procedures   ____________________________________________   INITIAL IMPRESSION / ASSESSMENT AND PLAN / ED COURSE  Pertinent labs & imaging results that were available during my care of the patient were reviewed by me and considered in my medical  decision making (see chart for details).  26 year old male presenting to the emergency department with intermittent chest pain no chest pain at present. EKG revealed no evidence of ischemia or infarction no arrhythmia appreciated. Patient does have a family history of SVT (sister) 6. Laboratory data unremarkable including troponin 2 d-dimer as well negative. A such patient will be referred to cardiologist for outpatient evaluation with possible Holter monitor placement.      ____________________________________________  FINAL CLINICAL IMPRESSION(S) / ED DIAGNOSES  Final diagnoses:  Chest pain, unspecified type  Heart palpitations     MEDICATIONS GIVEN DURING THIS VISIT:  Medications - No data to display   NEW OUTPATIENT MEDICATIONS STARTED DURING THIS VISIT:  New Prescriptions   No medications on file    Modified Medications   No medications on file    Discontinued Medications   No medications on file     Note:  This document was prepared using Dragon voice recognition software and may include unintentional dictation errors.    Darci CurrentBrown, [place] N, MD 10/16/16 [phone]

## 2016-10-16 NOTE — ED Notes (Signed)
Patient verbalizes understanding of d/c instructions and follow-up. VS stable and pain controlled per patient.  Patient in NAD at time of d/c and denies further concerns regarding this visit. Patient stable at the time of departure from the unit, departing unit by the safest and most appropriate manner per that patients condition and limitations. Patient advised to return to the ED at any time for emergent concerns, or for new/worsening symptoms.   

## 2016-10-16 NOTE — ED Notes (Signed)
Patient reported to tech he was feeling a weird feeling in his heart again. Monitor did capture a ST at 123bpm. Patient states he was just sitting there and his heart sped up and then he got anxious. Patient reports he wants to get up and walk/pace. Explained to patient needs to stay on the monitor. EDP aware.

## 2016-10-16 NOTE — ED Notes (Signed)
ED Provider at bedside. 

## 2016-10-16 NOTE — ED Notes (Signed)
Sig pad not working  

## 2016-10-20 ENCOUNTER — Emergency Department (HOSPITAL_COMMUNITY)
Admission: EM | Admit: 2016-10-20 | Discharge: 2016-10-20 | Disposition: A | Discharge status: Home / Self Care | Payer: Medicare Other | Attending: Emergency Medicine | Admitting: Emergency Medicine

## 2016-10-20 ENCOUNTER — Emergency Department (HOSPITAL_COMMUNITY): Payer: Medicare Other

## 2016-10-20 ENCOUNTER — Encounter (HOSPITAL_COMMUNITY): Payer: Self-pay | Admitting: *Deleted

## 2016-10-20 DIAGNOSIS — I10 Essential (primary) hypertension: Secondary | ICD-10-CM | POA: Diagnosis not present

## 2016-10-20 DIAGNOSIS — R109 Unspecified abdominal pain: Secondary | ICD-10-CM

## 2016-10-20 DIAGNOSIS — R1084 Generalized abdominal pain: Secondary | ICD-10-CM | POA: Insufficient documentation

## 2016-10-20 DIAGNOSIS — Z79899 Other long term (current) drug therapy: Secondary | ICD-10-CM | POA: Insufficient documentation

## 2016-10-20 DIAGNOSIS — N200 Calculus of kidney: Secondary | ICD-10-CM | POA: Diagnosis not present

## 2016-10-20 DIAGNOSIS — Z87891 Personal history of nicotine dependence: Secondary | ICD-10-CM | POA: Insufficient documentation

## 2016-10-20 DIAGNOSIS — Z87442 Personal history of urinary calculi: Secondary | ICD-10-CM | POA: Insufficient documentation

## 2016-10-20 LAB — BASIC METABOLIC PANEL
ANION GAP: 8 (ref 5–15)
BUN: 5 mg/dL — ABNORMAL LOW (ref 6–20)
CO2: 25 mmol/L (ref 22–32)
Calcium: 9.7 mg/dL (ref 8.9–10.3)
Chloride: 106 mmol/L (ref 101–111)
Creatinine, Ser: 1.07 mg/dL (ref 0.61–1.24)
GFR calc Af Amer: >60 mL/min (ref >60)
GFR calc non Af Amer: >60 mL/min (ref >60)
GLUCOSE: 86 mg/dL (ref 65–99)
POTASSIUM: 4.3 mmol/L (ref 3.5–5.1)
Sodium: 139 mmol/L (ref 135–145)

## 2016-10-20 LAB — URINALYSIS, ROUTINE W REFLEX MICROSCOPIC
Bilirubin Urine: NEGATIVE
Glucose, UA: NEGATIVE mg/dL
KETONES UR: NEGATIVE mg/dL
LEUKOCYTES UA: NEGATIVE
Nitrite: NEGATIVE
PROTEIN: NEGATIVE mg/dL
Specific Gravity, Urine: 1.005 (ref 1.005–1.030)
pH: 6 (ref 5.0–8.0)

## 2016-10-20 LAB — CBC WITH DIFFERENTIAL/PLATELET
BASOS ABS: 0.1 10*3/uL (ref 0.0–0.1)
Basophils Relative: 1 %
EOS PCT: 4 %
Eosinophils Absolute: 0.4 10*3/uL (ref 0.0–0.7)
HEMATOCRIT: 49.1 % (ref 39.0–52.0)
Hemoglobin: 16.8 g/dL (ref 13.0–17.0)
Lymphocytes Relative: 23 %
Lymphs Abs: 2.2 10*3/uL (ref 0.7–4.0)
MCH: 32.5 pg (ref 26.0–34.0)
MCHC: 34.2 g/dL (ref 30.0–36.0)
MCV: 95 fL (ref 78.0–100.0)
MONO ABS: 1.1 10*3/uL — AB (ref 0.1–1.0)
Monocytes Relative: 11 %
NEUTROS ABS: 6 10*3/uL (ref 1.7–7.7)
Neutrophils Relative %: 61 %
Platelets: 329 10*3/uL (ref 150–400)
RBC: 5.17 MIL/uL (ref 4.22–5.81)
RDW: 13 % (ref 11.5–15.5)
WBC: 9.7 10*3/uL (ref 4.0–10.5)

## 2016-10-20 MED ORDER — SODIUM CHLORIDE 0.9 % IV BOLUS (SEPSIS)
1000.0000 mL | Freq: Once | INTRAVENOUS | Status: AC
  Administered 2016-10-20: 1000 mL via INTRAVENOUS

## 2016-10-20 MED ORDER — NAPROXEN 500 MG PO TABS: 500.0000 mg | ORAL_TABLET | Freq: Two times a day (BID) | ORAL | 0 refills | Status: DC

## 2016-10-20 MED ORDER — ONDANSETRON 4 MG PO TBDP: 4.0000 mg | ORAL_TABLET | Freq: Three times a day (TID) | ORAL | 0 refills | Status: DC | PRN

## 2016-10-20 MED ORDER — ONDANSETRON HCL 4 MG/2ML IJ SOLN
4.0000 mg | Freq: Once | INTRAMUSCULAR | Status: AC
  Administered 2016-10-20: 4 mg via INTRAVENOUS
  Filled 2016-10-20: qty 2

## 2016-10-20 MED ORDER — OXYCODONE-ACETAMINOPHEN 5-325 MG PO TABS
1.0000 | ORAL_TABLET | Freq: Once | ORAL | Status: AC
  Administered 2016-10-20: 1 via ORAL
  Filled 2016-10-20: qty 1

## 2016-10-20 MED ORDER — KETOROLAC TROMETHAMINE 30 MG/ML IJ SOLN
30.0000 mg | Freq: Once | INTRAMUSCULAR | Status: AC
  Administered 2016-10-20: 30 mg via INTRAVENOUS
  Filled 2016-10-20: qty 1

## 2016-10-20 NOTE — ED Provider Notes (Signed)
[facility]-EMERGENCY DEPT Provider Note   CSN: [id number] Arrival date & time: 10/20/16  0015     History   Chief Complaint Chief Complaint  Patient presents with  . Flank Pain    HPI Jorge ArntJustin T Nichols is a 26 y.o. male.  HPI  This is a 26 year old male with a history of kidney stones who presents with right flank pain. Patient reports onset of symptoms on Saturday. He reports right flank pain that radiates into his right groin. Consistent with prior kidney stones. Currently his pain is 7 out of 10. He has hydrocodone at home for chronic pain. He states this was working but pain worsened tonight. Reports dark urine. No dysuria. No fevers.  Does report nausea and vomiting.  Past Medical History:  Diagnosis Date  . Anxiety   . Chronic headaches   . Chronic hip pain   . Hematuria   . History of avascular necrosis of capital femoral epiphysis    bilateral hip s/p  replacement's  . Hypertension   . Mixed dyslipidemia   . Nephrolithiasis    bilateral  non-obstuctive per ct 12-25-2015  . Other hyperparathyroidism (HCC)   . Right ureteral stone   . Vitamin D deficiency   . Weak urinary stream     Patient Active Problem List   Diagnosis Date Noted  . Vitamin D deficiency 06/21/2015  . Other hyperparathyroidism (HCC) 06/05/2015  . BP (high blood pressure) 05/13/2015  . Calculus of kidney 05/13/2015  . Arthralgia of hip 12/04/2013  . Iliopsoas bursitis 12/04/2013  . History of repair of hip joint 07/03/2013  . Avascular necrosis of bone (HCC) 03/20/2013  . Mixed dyslipidemia 09/09/2012  . Metabolic syndrome 09/09/2012  . Morbid obesity (HCC) 09/09/2012  . Sleep disorder breathing 09/09/2012  . Family history of premature CAD 09/09/2012    Past Surgical History:  Procedure Laterality Date  . CYSTOSCOPY WITH URETEROSCOPY, STONE BASKETRY AND STENT PLACEMENT Right 01/02/2016   Procedure: CYSTOSCOPY WITH RIGHT URETEROSCOPY, STONE EXTRACTION  AND STENT PLACEMENT;  Surgeon: Bjorn PippinJohn  Wrenn, MD;  [facility] [place];  Service: Urology;  Laterality: Right;  . EXTRACORPOREAL SHOCK WAVE LITHOTRIPSY  yrs ago  . HIP SURGERY Bilateral 2015  . HOLMIUM LASER APPLICATION Right 01/02/2016   Procedure: HOLMIUM LASER APPLICATION;  Surgeon: Bjorn PippinJohn Wrenn, MD;  [facility] [place];  Service: Urology;  Laterality: Right;  . TOTAL HIP ARTHROPLASTY Bilateral 2015  . URETEROLITHOTOMY  x2  last one 2007       Home Medications    Prior to Admission medications   Medication Sig Start Date End Date Taking? Authorizing Provider  ALPRAZolam Prudy Feeler(XANAX) 0.5 MG tablet Take 0.5 mg by mouth 2 (two) times daily as needed for anxiety.  08/01/16   [provider]  Aspirin-Salicylamide-Caffeine (ARTHRITIS STRENGTH BC POWDER PO) Take 1 Package by mouth daily as needed (pain).    [provider]  baclofen (LIORESAL) 20 MG tablet Take 20 mg by mouth 2 (two) times daily as needed for muscle spasms.  07/30/16   [provider]  HYDROcodone-acetaminophen (NORCO/VICODIN) 5-325 MG tablet Take 1 tablet by mouth every 6 (six) hours as needed for moderate pain.     [provider]  metoprolol (LOPRESSOR) 50 MG tablet Take 50 mg by mouth 2 (two) times daily.    [provider]  naproxen (NAPROSYN) 500 MG tablet Take 1 tablet (500 mg total) by mouth 2 (two) times daily. 10/20/16   Keithon Mccoin, Mayer Maskerourtney F, MD  ondansetron (ZOFRAN ODT) 4 MG disintegrating tablet Take 1 tablet (4 mg total) by mouth every 8 (eight) hours as needed for nausea or vomiting. 10/20/16   Jarius Dieudonne, Mayer Masker, MD  potassium citrate (UROCIT-K) 10 MEQ (1080 MG) SR tablet Take 10 mEq by mouth 3 (three) times daily. 07/02/15   [provider]  tamsulosin (FLOMAX) 0.4 MG CAPS capsule Take 1 capsule (0.4 mg total) by mouth daily. 02/03/16   Gilda Crease, MD  Vitamin D, Ergocalciferol, (DRISDOL) 50000 units CAPS capsule Take 1 capsule (50,000 Units total) by mouth every 7  (seven) days. Patient not taking: Reported on 08/12/2016 10/23/15   Roma Kayser, MD    Family History Family History  Problem Relation Age of Onset  . Hypertension Father     Social History Social History  Substance Use Topics  . Smoking status: Former Smoker    Packs/day: 0.50    Years: 15.00  . Smokeless tobacco: Current User    Types: Snuff  . Alcohol use Yes     Comment: occassionally     Allergies   Patient has no known allergies.   Review of Systems Review of Systems  Constitutional: Negative for fever.  Respiratory: Negative for shortness of breath.   Cardiovascular: Negative for chest pain.  Gastrointestinal: Positive for nausea and vomiting.  Genitourinary: Positive for flank pain and hematuria. Negative for dysuria.  All other systems reviewed and are negative.    Physical Exam Updated Vital Signs BP 119/71 (BP Location: Left Arm)   Pulse 79   Temp 98.3 F (36.8 C) (Oral)   Resp 18   Ht 5\' 8"  (1.727 m)   Wt 112.5 kg (248 lb)   SpO2 99%   BMI 37.71 kg/m   Physical Exam  Constitutional: He is oriented to person, place, and time. He appears well-developed and well-nourished.  Overweight  HENT:  Head: Normocephalic and atraumatic.  Cardiovascular: Normal rate, regular rhythm and normal heart sounds.   No murmur heard. Pulmonary/Chest: Effort normal and breath sounds normal. No respiratory distress. He has no wheezes.  Abdominal: Soft. Bowel sounds are normal. There is no tenderness. There is no rebound.  Genitourinary:  Genitourinary Comments: No CVA tenderness  Neurological: He is alert and oriented to person, place, and time.  Skin: Skin is warm and dry.  Psychiatric: He has a normal mood and affect.  Nursing note and vitals reviewed.    ED Treatments / Results  Labs (all labs ordered are listed, but only abnormal results are displayed) Labs Reviewed  CBC WITH DIFFERENTIAL/PLATELET - Abnormal; Notable for the following:        Result Value   Monocytes Absolute 1.1 (*)    All other components within normal limits  BASIC METABOLIC PANEL - Abnormal; Notable for the following:    BUN 5 (*)    All other components within normal limits  URINALYSIS, ROUTINE W REFLEX MICROSCOPIC - Abnormal; Notable for the following:    Hgb urine dipstick LARGE (*)    Bacteria, UA RARE (*)    Squamous Epithelial / LPF 0-5 (*)    All other components within normal limits    EKG  EKG Interpretation None       Radiology Dg Abdomen 1 View  Result Date: 10/20/2016 CLINICAL DATA:  26 year old male with history of kidney stones presenting with flank pain. EXAM: ABDOMEN - 1 VIEW COMPARISON:  Abdominal radiograph dated 08/12/2016 and CT dated 05/09/2016 FINDINGS: Multiple small bilateral renal calculi noted as  seen on the prior CT. No definite stone identified along the course of the ureters or in the pelvis. There is no bowel dilatation or evidence of obstruction. No free air. The soft tissues appear unremarkable. Bilateral hip arthroplasties noted. No acute osseous pathology. IMPRESSION: 1. Small bilateral renal calculi. No stone along the expected course of the ureters. 2. No bowel dilatation or evidence of obstruction. Electronically Signed   By: Elgie CollardArash  Radparvar M.D.   On: 10/20/2016 01:55    Procedures Procedures (including critical care time)  Medications Ordered in ED Medications  sodium chloride 0.9 % bolus 1,000 mL (0 mLs Intravenous Stopped 10/20/16 0303)  ketorolac (TORADOL) 30 MG/ML injection 30 mg (30 mg Intravenous Given 10/20/16 0211)  ondansetron (ZOFRAN) injection 4 mg (4 mg Intravenous Given 10/20/16 0211)  oxyCODONE-acetaminophen (PERCOCET/ROXICET) 5-325 MG per tablet 1 tablet (1 tablet Oral Given 10/20/16 0235)     Initial Impression / Assessment and Plan / ED Course  I have reviewed the triage vital signs and the nursing notes.  Pertinent labs & imaging results that were available during my care of the patient  were reviewed by me and considered in my medical decision making (see chart for details).     Patient presents with right flank pain. History of kidney stones. Multiple prior visits for the same. He is nontoxic-appearing. He appears comfortable on exam. Vital signs reassuring. Exam is largely unremarkable. He does have 6-30 red cells in the urine. KUB does not show any large obstructing stone. Try to avoid CT scan given history of multiple prior CT scans. Patient was given Zofran, fluids, Toradol, and Percocet. Symptom control at home and urology follow-up recommended.  After history, exam, and medical workup I feel the patient has been appropriately medically screened and is safe for discharge home. Pertinent diagnoses were discussed with the patient. Patient was given return precautions.   Final Clinical Impressions(s) / ED Diagnoses   Final diagnoses:  Flank pain  History of kidney stones    New Prescriptions New Prescriptions   NAPROXEN (NAPROSYN) 500 MG TABLET    Take 1 tablet (500 mg total) by mouth 2 (two) times daily.   ONDANSETRON (ZOFRAN ODT) 4 MG DISINTEGRATING TABLET    Take 1 tablet (4 mg total) by mouth every 8 (eight) hours as needed for nausea or vomiting.     Shon BatonHorton, Kasondra Junod F, MD 10/20/16 [phone]

## 2016-10-20 NOTE — Discharge Instructions (Signed)
You were seen today for flank pain consistent with her prior kidney stones. Her workup is reassuring. Continued pain medication at home. Add naproxen and Zofran as needed. Follow-up with your urologist if symptoms worsen.

## 2016-10-20 NOTE — ED Triage Notes (Signed)
Pt c/o right flank pain that started x 3 days ago; pt state he has had some n/v and urgency

## 2016-10-22 DIAGNOSIS — R0789 Other chest pain: Secondary | ICD-10-CM | POA: Diagnosis not present

## 2016-10-22 DIAGNOSIS — I208 Other forms of angina pectoris: Secondary | ICD-10-CM | POA: Diagnosis not present

## 2016-10-22 DIAGNOSIS — R0602 Shortness of breath: Secondary | ICD-10-CM | POA: Diagnosis not present

## 2016-10-22 DIAGNOSIS — Z96643 Presence of artificial hip joint, bilateral: Secondary | ICD-10-CM | POA: Diagnosis not present

## 2016-10-22 DIAGNOSIS — F17200 Nicotine dependence, unspecified, uncomplicated: Secondary | ICD-10-CM | POA: Diagnosis not present

## 2016-10-22 DIAGNOSIS — R002 Palpitations: Secondary | ICD-10-CM | POA: Diagnosis not present

## 2016-10-22 DIAGNOSIS — R011 Cardiac murmur, unspecified: Secondary | ICD-10-CM | POA: Diagnosis not present

## 2016-10-22 DIAGNOSIS — E6609 Other obesity due to excess calories: Secondary | ICD-10-CM | POA: Diagnosis not present

## 2016-10-22 DIAGNOSIS — Z6835 Body mass index (BMI) 35.0-35.9, adult: Secondary | ICD-10-CM | POA: Diagnosis not present

## 2016-10-22 DIAGNOSIS — R000 Tachycardia, unspecified: Secondary | ICD-10-CM | POA: Diagnosis not present

## 2016-10-30 DIAGNOSIS — R011 Cardiac murmur, unspecified: Secondary | ICD-10-CM | POA: Diagnosis not present

## 2016-10-30 DIAGNOSIS — R0602 Shortness of breath: Secondary | ICD-10-CM | POA: Diagnosis not present

## 2016-11-05 DIAGNOSIS — R011 Cardiac murmur, unspecified: Secondary | ICD-10-CM | POA: Diagnosis not present

## 2016-11-05 DIAGNOSIS — R002 Palpitations: Secondary | ICD-10-CM | POA: Diagnosis not present

## 2016-11-05 DIAGNOSIS — R000 Tachycardia, unspecified: Secondary | ICD-10-CM | POA: Diagnosis not present

## 2016-11-05 DIAGNOSIS — Z96643 Presence of artificial hip joint, bilateral: Secondary | ICD-10-CM | POA: Diagnosis not present

## 2016-11-05 DIAGNOSIS — Z6835 Body mass index (BMI) 35.0-35.9, adult: Secondary | ICD-10-CM | POA: Diagnosis not present

## 2016-11-05 DIAGNOSIS — F17200 Nicotine dependence, unspecified, uncomplicated: Secondary | ICD-10-CM | POA: Diagnosis not present

## 2016-11-05 DIAGNOSIS — R0602 Shortness of breath: Secondary | ICD-10-CM | POA: Diagnosis not present

## 2016-11-05 DIAGNOSIS — R0789 Other chest pain: Secondary | ICD-10-CM | POA: Diagnosis not present

## 2016-11-05 DIAGNOSIS — I208 Other forms of angina pectoris: Secondary | ICD-10-CM | POA: Diagnosis not present

## 2016-11-05 DIAGNOSIS — E6609 Other obesity due to excess calories: Secondary | ICD-10-CM | POA: Diagnosis not present

## 2016-11-27 ENCOUNTER — Emergency Department
Admission: EM | Admit: 2016-11-27 | Discharge: 2016-11-27 | Disposition: A | Discharge status: Home / Self Care | Payer: Medicare Other | Attending: Emergency Medicine | Admitting: Emergency Medicine

## 2016-11-27 ENCOUNTER — Emergency Department: Payer: Medicare Other

## 2016-11-27 ENCOUNTER — Encounter: Payer: Self-pay | Admitting: Emergency Medicine

## 2016-11-27 DIAGNOSIS — R109 Unspecified abdominal pain: Secondary | ICD-10-CM

## 2016-11-27 DIAGNOSIS — G8929 Other chronic pain: Secondary | ICD-10-CM | POA: Diagnosis not present

## 2016-11-27 DIAGNOSIS — N200 Calculus of kidney: Secondary | ICD-10-CM | POA: Diagnosis not present

## 2016-11-27 DIAGNOSIS — F17220 Nicotine dependence, chewing tobacco, uncomplicated: Secondary | ICD-10-CM | POA: Insufficient documentation

## 2016-11-27 DIAGNOSIS — R1032 Left lower quadrant pain: Secondary | ICD-10-CM | POA: Diagnosis not present

## 2016-11-27 DIAGNOSIS — I10 Essential (primary) hypertension: Secondary | ICD-10-CM | POA: Insufficient documentation

## 2016-11-27 LAB — URINALYSIS, COMPLETE (UACMP) WITH MICROSCOPIC
BACTERIA UA: NONE SEEN
BILIRUBIN URINE: NEGATIVE
GLUCOSE, UA: NEGATIVE mg/dL
KETONES UR: NEGATIVE mg/dL
LEUKOCYTES UA: NEGATIVE
Nitrite: NEGATIVE
PROTEIN: NEGATIVE mg/dL
Specific Gravity, Urine: 1.016 (ref 1.005–1.030)
pH: 5 (ref 5.0–8.0)

## 2016-11-27 LAB — BASIC METABOLIC PANEL
Anion gap: 9 (ref 5–15)
BUN: 6 mg/dL (ref 6–20)
CO2: 24 mmol/L (ref 22–32)
CREATININE: 1.1 mg/dL (ref 0.61–1.24)
Calcium: 9.4 mg/dL (ref 8.9–10.3)
Chloride: 106 mmol/L (ref 101–111)
GFR calc Af Amer: >60 mL/min (ref >60)
GLUCOSE: 73 mg/dL (ref 65–99)
POTASSIUM: 3.9 mmol/L (ref 3.5–5.1)
Sodium: 139 mmol/L (ref 135–145)

## 2016-11-27 LAB — CBC
HEMATOCRIT: 47.7 % (ref 40.0–52.0)
Hemoglobin: 16.1 g/dL (ref 13.0–18.0)
MCH: 31.8 pg (ref 26.0–34.0)
MCHC: 33.7 g/dL (ref 32.0–36.0)
MCV: 94.5 fL (ref 80.0–100.0)
PLATELETS: 348 10*3/uL (ref 150–440)
RBC: 5.05 MIL/uL (ref 4.40–5.90)
RDW: 14.1 % (ref 11.5–14.5)
WBC: 11.9 10*3/uL — AB (ref 3.8–10.6)

## 2016-11-27 MED ORDER — PREDNISONE 10 MG (21) PO TBPK: ORAL_TABLET | Freq: Every day | ORAL | 0 refills | Status: DC

## 2016-11-27 MED ORDER — IBUPROFEN 800 MG PO TABS: 800.0000 mg | ORAL_TABLET | Freq: Once | ORAL | Status: DC

## 2016-11-27 MED ORDER — PREDNISONE 20 MG PO TABS: 60.0000 mg | ORAL_TABLET | Freq: Once | ORAL | Status: DC

## 2016-11-27 MED ORDER — IBUPROFEN 800 MG PO TABS: 800.0000 mg | ORAL_TABLET | Freq: Three times a day (TID) | ORAL | 0 refills | Status: DC | PRN

## 2016-11-27 NOTE — ED Provider Notes (Signed)
[facility] Emergency Department Provider Note       Time seen: ----------------------------------------- 4:30 PM on 11/27/2016 -----------------------------------------     I have reviewed the triage vital signs and the nursing notes.   HISTORY   Chief Complaint Groin Pain    HPI Jorge Nichols is a 26 y.o. male who presents to the ED for left flank pain. Patient states the pain started 2 days ago left flank and has moved into the left groin. Patient states it feels like kidney stones that he has had the past and he is passing blood clots. He said nausea without vomiting or diarrhea. Pain is 8 out of 10 in the left flank.   Past Medical History:  Diagnosis Date  . Anxiety   . Chronic headaches   . Chronic hip pain   . Hematuria   . History of avascular necrosis of capital femoral epiphysis    bilateral hip s/p  replacement's  . Hypertension   . Mixed dyslipidemia   . Nephrolithiasis    bilateral  non-obstuctive per ct 12-25-2015  . Other hyperparathyroidism (HCC)   . Right ureteral stone   . Vitamin D deficiency   . Weak urinary stream     Patient Active Problem List   Diagnosis Date Noted  . Vitamin D deficiency 06/21/2015  . Other hyperparathyroidism (HCC) 06/05/2015  . BP (high blood pressure) 05/13/2015  . Calculus of kidney 05/13/2015  . Arthralgia of hip 12/04/2013  . Iliopsoas bursitis 12/04/2013  . History of repair of hip joint 07/03/2013  . Avascular necrosis of bone (HCC) 03/20/2013  . Mixed dyslipidemia 09/09/2012  . Metabolic syndrome 09/09/2012  . Morbid obesity (HCC) 09/09/2012  . Sleep disorder breathing 09/09/2012  . Family history of premature CAD 09/09/2012    Past Surgical History:  Procedure Laterality Date  . CYSTOSCOPY WITH URETEROSCOPY, STONE BASKETRY AND STENT PLACEMENT Right 01/02/2016   Procedure: CYSTOSCOPY WITH RIGHT URETEROSCOPY, STONE EXTRACTION  AND STENT PLACEMENT;  Surgeon: Bjorn Pippin, MD;   Location: [facility];  Service: Urology;  Laterality: Right;  . EXTRACORPOREAL SHOCK WAVE LITHOTRIPSY  yrs ago  . HIP SURGERY Bilateral 2015  . HOLMIUM LASER APPLICATION Right 01/02/2016   Procedure: HOLMIUM LASER APPLICATION;  Surgeon: Bjorn Pippin, MD;  Location: [facility];  Service: Urology;  Laterality: Right;  . TOTAL HIP ARTHROPLASTY Bilateral 2015  . URETEROLITHOTOMY  x2  last one 2007    Allergies Patient has no known allergies.  Social History Social History  Substance Use Topics  . Smoking status: Former Smoker    Packs/day: 0.50    Years: 15.00  . Smokeless tobacco: Current User    Types: Snuff  . Alcohol use Yes     Comment: occassionally    Review of Systems Constitutional: Negative for fever. Eyes: Negative for vision changes ENT:  Negative for congestion, sore throat Cardiovascular: Negative for chest pain. Respiratory: Negative for shortness of breath. Gastrointestinal: positive for flank pain Genitourinary: Negative for dysuria.positive for hematuria Musculoskeletal: Negative for back pain. Skin: Negative for rash. Neurological: Negative for headaches, focal weakness or numbness.  All systems negative/normal/unremarkable except as stated in the HPI  ____________________________________________   PHYSICAL EXAM:  VITAL SIGNS: ED Triage Vitals  Enc Vitals Group     BP 11/27/16 1536 (!) 133/93     Pulse Rate 11/27/16 1536 (!) 105     Resp 11/27/16 1536 (!) 22     Temp 11/27/16 1536 (!) 97.4 F (  36.3 C)     Temp Source 11/27/16 1536 Oral     SpO2 11/27/16 1536 99 %     Weight 11/27/16 1537 240 lb (108.9 kg)     Height 11/27/16 1537 5\' 8"  (1.727 m)     Head Circumference --      Peak Flow --      Pain Score 11/27/16 1538 8     Pain Loc --      Pain Edu? --      Excl. in GC? --     Constitutional: Alert and oriented. Well appearing and in no distress. Eyes: Conjunctivae are normal. Normal extraocular  movements. ENT   Head: Normocephalic and atraumatic.   Nose: No congestion/rhinnorhea.   Mouth/Throat: Mucous membranes are moist.   Neck: No stridor. Cardiovascular: Normal rate, regular rhythm. No murmurs, rubs, or gallops. Respiratory: Normal respiratory effort without tachypnea nor retractions. Breath sounds are clear and equal bilaterally. No wheezes/rales/rhonchi. Gastrointestinal: Soft and nontender. Normal bowel sounds Musculoskeletal: Nontender with normal range of motion in extremities. No lower extremity tenderness nor edema. Neurologic:  Normal speech and language. No gross focal neurologic deficits are appreciated.  Skin:  Skin is warm, dry and intact. No rash noted. Psychiatric: Mood and affect are normal. Speech and behavior are normal.  ____________________________________________  ED COURSE:  Pertinent labs & imaging results that were available during my care of the patient were reviewed by me and considered in my medical decision making (see chart for details). Patient presents for acute on chronic flank pain, we will assess with labs and imaging as indicated.   Procedures ____________________________________________   LABS (pertinent positives/negatives)  Labs Reviewed  URINALYSIS, COMPLETE (UACMP) WITH MICROSCOPIC - Abnormal; Notable for the following:       Result Value   Color, Urine YELLOW (*)    APPearance CLEAR (*)    Hgb urine dipstick MODERATE (*)    Squamous Epithelial / LPF 0-5 (*)    All other components within normal limits  CBC - Abnormal; Notable for the following:    WBC 11.9 (*)    All other components within normal limits  BASIC METABOLIC PANEL    RADIOLOGY  renal ultrasound  IMPRESSION: Multiple small bilateral renal calculi without hydronephrosis. ____________________________________________  FINAL ASSESSMENT AND PLAN  flank pain   Plan: Patient's labs and imaging were dictated above. Patient had presented for  flank pain of uncertain etiology. Patient has had over 30 CT scans for the same presentation as well as plain films and ultrasounds. He does have some history of renal colic but has had numerous imaging studies without any extra renal stone. Ultrasound here was reassuring, he'll be discharged with steroids and NSAIDs. He is stable for outpatient follow-up.   Emily FilbertWilliams, Nareg Breighner E, MD   Note: This note was generated in part or whole with voice recognition software. Voice recognition is usually quite accurate but there are transcription errors that can and very often do occur. I apologize for any typographical errors that were not detected and corrected.     Emily FilbertWilliams, Karlyn Glasco E, MD 11/27/16 [phone]

## 2016-11-27 NOTE — ED Notes (Signed)
Patient walked out without prescriptions and stated "I don't want them, I have them at home".  Patient also asked what his pain scale is on a 0-10 scale and he refuse to answer this nurse and stated "Just make one up, I don't even care".  Patient then left facility.

## 2016-11-27 NOTE — ED Triage Notes (Signed)
Pt to ED from home c/o left side pain.  States started two days ago in left flank and has moved to left groin.  States feels like kidney stones like in the past, states passing blood clots.  Nausea without vomiting or diarrhea.

## 2016-11-29 DIAGNOSIS — M545 Low back pain: Secondary | ICD-10-CM | POA: Diagnosis not present

## 2016-11-29 DIAGNOSIS — R3129 Other microscopic hematuria: Secondary | ICD-10-CM | POA: Diagnosis not present

## 2016-11-29 DIAGNOSIS — Z87442 Personal history of urinary calculi: Secondary | ICD-10-CM | POA: Diagnosis not present

## 2016-11-29 DIAGNOSIS — R319 Hematuria, unspecified: Secondary | ICD-10-CM | POA: Diagnosis not present

## 2016-11-29 DIAGNOSIS — N200 Calculus of kidney: Secondary | ICD-10-CM | POA: Diagnosis not present

## 2016-11-29 DIAGNOSIS — Z87891 Personal history of nicotine dependence: Secondary | ICD-10-CM | POA: Diagnosis not present

## 2016-11-29 DIAGNOSIS — Z79899 Other long term (current) drug therapy: Secondary | ICD-10-CM | POA: Diagnosis not present

## 2016-11-29 DIAGNOSIS — N23 Unspecified renal colic: Secondary | ICD-10-CM | POA: Diagnosis not present

## 2016-11-29 DIAGNOSIS — R1030 Lower abdominal pain, unspecified: Secondary | ICD-10-CM | POA: Diagnosis not present

## 2016-12-19 ENCOUNTER — Emergency Department
Admission: EM | Admit: 2016-12-19 | Discharge: 2016-12-20 | Disposition: A | Discharge status: Home / Self Care | Payer: Medicare Other | Attending: Emergency Medicine | Admitting: Emergency Medicine

## 2016-12-19 DIAGNOSIS — R1031 Right lower quadrant pain: Secondary | ICD-10-CM | POA: Diagnosis not present

## 2016-12-19 DIAGNOSIS — Z87891 Personal history of nicotine dependence: Secondary | ICD-10-CM | POA: Insufficient documentation

## 2016-12-19 DIAGNOSIS — I10 Essential (primary) hypertension: Secondary | ICD-10-CM | POA: Diagnosis not present

## 2016-12-19 DIAGNOSIS — R109 Unspecified abdominal pain: Secondary | ICD-10-CM | POA: Diagnosis not present

## 2016-12-19 LAB — CBC
HCT: 46.3 % (ref 40.0–52.0)
Hemoglobin: 16.2 g/dL (ref 13.0–18.0)
MCH: 33.5 pg (ref 26.0–34.0)
MCHC: 35.1 g/dL (ref 32.0–36.0)
MCV: 95.5 fL (ref 80.0–100.0)
PLATELETS: 313 10*3/uL (ref 150–440)
RBC: 4.85 MIL/uL (ref 4.40–5.90)
RDW: 14 % (ref 11.5–14.5)
WBC: 11.1 10*3/uL — AB (ref 3.8–10.6)

## 2016-12-19 LAB — COMPREHENSIVE METABOLIC PANEL
ALK PHOS: 62 U/L (ref 38–126)
ALT: 29 U/L (ref 17–63)
AST: 29 U/L (ref 15–41)
Albumin: 4.3 g/dL (ref 3.5–5.0)
Anion gap: 9 (ref 5–15)
BILIRUBIN TOTAL: 0.7 mg/dL (ref 0.3–1.2)
BUN: 10 mg/dL (ref 6–20)
CALCIUM: 9.9 mg/dL (ref 8.9–10.3)
CO2: 24 mmol/L (ref 22–32)
CREATININE: 1.02 mg/dL (ref 0.61–1.24)
Chloride: 108 mmol/L (ref 101–111)
Glucose, Bld: 94 mg/dL (ref 65–99)
Potassium: 3.9 mmol/L (ref 3.5–5.1)
Sodium: 141 mmol/L (ref 135–145)
TOTAL PROTEIN: 7.5 g/dL (ref 6.5–8.1)

## 2016-12-19 LAB — LIPASE, BLOOD: LIPASE: 30 U/L (ref 11–51)

## 2016-12-19 MED ORDER — ONDANSETRON HCL 4 MG/2ML IJ SOLN
4.0000 mg | Freq: Once | INTRAMUSCULAR | Status: AC
  Administered 2016-12-19: 4 mg via INTRAVENOUS
  Filled 2016-12-19: qty 2

## 2016-12-19 MED ORDER — SODIUM CHLORIDE 0.9 % IV BOLUS (SEPSIS)
1000.0000 mL | Freq: Once | INTRAVENOUS | Status: AC
  Administered 2016-12-19: 1000 mL via INTRAVENOUS

## 2016-12-19 MED ORDER — IOPAMIDOL (ISOVUE-300) INJECTION 61%
30.0000 mL | Freq: Once | INTRAVENOUS | Status: AC
Start: 2016-12-19 — End: 2016-12-19
  Administered 2016-12-19: 30 mL via ORAL

## 2016-12-19 MED ORDER — MORPHINE SULFATE (PF) 4 MG/ML IV SOLN
4.0000 mg | Freq: Once | INTRAVENOUS | Status: AC
  Administered 2016-12-19: 4 mg via INTRAVENOUS
  Filled 2016-12-19: qty 1

## 2016-12-19 NOTE — ED Notes (Signed)
In to see/assess pt; pacing in room; pt voices need to void before IV start and medications given; ambulatory to BR with steady gait to void and collect specimen

## 2016-12-19 NOTE — ED Provider Notes (Signed)
[facility] Emergency Department Provider Note   ____________________________________________   First MD Initiated Contact with Patient 12/19/16 2314     (approximate)  I have reviewed the triage vital signs and the nursing notes.   HISTORY  Chief Complaint Abdominal Pain    HPI Jorge Nichols is a 26 y.o. male Who comes into the hospital today with some right lower quadrant abdominal pain. He states it started a couple of days ago. It would go away and then come back but the patient states that it has not gone away today. He reports that it hurts from the right side of his belly button all the way around to his back but mainly in this right lower quadrant spot. The patient denies any nausea or vomiting. He also denies any fevers. He had an episode of diarrhea yesterday. He denies having pain like this in the past. He reports that in his back feels like he may have a kidney stone but in the front it's the pain that he hasn't had before. the patient rates his pain a 7 out of 10 in intensity.The patient has taken nothing for pain at home. He is here today for evaluation.   Past Medical History:  Diagnosis Date  . Anxiety   . Chronic headaches   . Chronic hip pain   . Hematuria   . History of avascular necrosis of capital femoral epiphysis    bilateral hip s/p  replacement's  . Hypertension   . Mixed dyslipidemia   . Nephrolithiasis    bilateral  non-obstuctive per ct 12-25-2015  . Other hyperparathyroidism (HCC)   . Right ureteral stone   . Vitamin D deficiency   . Weak urinary stream     Patient Active Problem List   Diagnosis Date Noted  . Vitamin D deficiency 06/21/2015  . Other hyperparathyroidism (HCC) 06/05/2015  . BP (high blood pressure) 05/13/2015  . Calculus of kidney 05/13/2015  . Arthralgia of hip 12/04/2013  . Iliopsoas bursitis 12/04/2013  . History of repair of hip joint 07/03/2013  . Avascular necrosis of bone (HCC) 03/20/2013    . Mixed dyslipidemia 09/09/2012  . Metabolic syndrome 09/09/2012  . Morbid obesity (HCC) 09/09/2012  . Sleep disorder breathing 09/09/2012  . Family history of premature CAD 09/09/2012    Past Surgical History:  Procedure Laterality Date  . CYSTOSCOPY WITH URETEROSCOPY, STONE BASKETRY AND STENT PLACEMENT Right 01/02/2016   Procedure: CYSTOSCOPY WITH RIGHT URETEROSCOPY, STONE EXTRACTION  AND STENT PLACEMENT;  Surgeon: Bjorn Pippin, MD;  Location: [facility];  Service: Urology;  Laterality: Right;  . EXTRACORPOREAL SHOCK WAVE LITHOTRIPSY  yrs ago  . HIP SURGERY Bilateral 2015  . HOLMIUM LASER APPLICATION Right 01/02/2016   Procedure: HOLMIUM LASER APPLICATION;  Surgeon: Bjorn Pippin, MD;  Location: [facility];  Service: Urology;  Laterality: Right;  . TOTAL HIP ARTHROPLASTY Bilateral 2015  . URETEROLITHOTOMY  x2  last one 2007    Prior to Admission medications   Medication Sig Start Date End Date Taking? Authorizing Provider  ALPRAZolam Prudy Feeler) 0.5 MG tablet Take 0.5 mg by mouth 2 (two) times daily as needed for anxiety.  08/01/16   [provider]  Aspirin-Salicylamide-Caffeine (ARTHRITIS STRENGTH BC POWDER PO) Take 1 Package by mouth daily as needed (pain).    [provider]  baclofen (LIORESAL) 20 MG tablet Take 20 mg by mouth 2 (two) times daily as needed for muscle spasms.  07/30/16   [provider]  HYDROcodone-acetaminophen (NORCO/VICODIN) 5-325 MG tablet Take 1 tablet by mouth every 6 (six) hours as needed for moderate pain.     [provider]  ibuprofen (ADVIL,MOTRIN) 800 MG tablet Take 1 tablet (800 mg total) by mouth every 8 (eight) hours as needed. 11/27/16   Emily Filbert, MD  metoprolol (LOPRESSOR) 50 MG tablet Take 50 mg by mouth 2 (two) times daily.    [provider]  naproxen (NAPROSYN) 500 MG tablet Take 1 tablet (500 mg total) by mouth 2 (two) times daily. 10/20/16   Horton, Mayer Masker, MD   ondansetron (ZOFRAN ODT) 4 MG disintegrating tablet Take 1 tablet (4 mg total) by mouth every 8 (eight) hours as needed for nausea or vomiting. 10/20/16   Horton, Mayer Masker, MD  potassium citrate (UROCIT-K) 10 MEQ (1080 MG) SR tablet Take 10 mEq by mouth 3 (three) times daily. 07/02/15   [provider]  predniSONE (STERAPRED UNI-PAK 21 TAB) 10 MG (21) TBPK tablet Take by mouth daily. dispense steroid taper pack as directed 11/27/16   Emily Filbert, MD  tamsulosin (FLOMAX) 0.4 MG CAPS capsule Take 1 capsule (0.4 mg total) by mouth daily. 02/03/16   Gilda Crease, MD  Vitamin D, Ergocalciferol, (DRISDOL) 50000 units CAPS capsule Take 1 capsule (50,000 Units total) by mouth every 7 (seven) days. Patient not taking: Reported on 08/12/2016 10/23/15   Roma Kayser, MD    Allergies Patient has no known allergies.  Family History  Problem Relation Age of Onset  . Hypertension Father     Social History Social History  Substance Use Topics  . Smoking status: Former Smoker    Packs/day: 0.50    Years: 15.00  . Smokeless tobacco: Current User    Types: Snuff  . Alcohol use Yes     Comment: occassionally    Review of Systems  Constitutional: No fever/chills Eyes: No visual changes. ENT: No sore throat. Cardiovascular: Denies chest pain. Respiratory: Denies shortness of breath. Gastrointestinal:  abdominal pain and diarrhea No nausea, no vomiting.  No diarrhea.  No constipation. Genitourinary: Negative for dysuria. Musculoskeletal: Negative for back pain. Skin: Negative for rash. Neurological: Negative for headaches, focal weakness or numbness.   ____________________________________________   PHYSICAL EXAM:  VITAL SIGNS: ED Triage Vitals  Enc Vitals Group     BP 12/19/16 1853 136/88     Pulse Rate 12/19/16 1853 92     Resp 12/19/16 1853 18     Temp 12/19/16 1853 98.7 F (37.1 C)     Temp Source 12/19/16 1853 Oral     SpO2 12/19/16 1853 98 %      Weight 12/19/16 1853 250 lb (113.4 kg)     Height 12/19/16 1853  (1.727 m)     Head Circumference --      Peak Flow --      Pain Score 12/19/16 1901 7     Pain Loc --      Pain Edu? --      Excl. in GC? --     Constitutional: Alert and oriented. Well appearing and in mild distress. Eyes: Conjunctivae are normal. PERRL. EOMI. Head: Atraumatic. Nose: No congestion/rhinnorhea. Mouth/Throat: Mucous membranes are moist.  Oropharynx non-erythematous. Cardiovascular: Normal rate, regular rhythm. Grossly normal heart sounds.  Good peripheral circulation. Respiratory: Normal respiratory effort.  No retractions. Lungs CTAB. Gastrointestinal: Soft with some right lower quadrant tenderness to palpation. No distention. positive bowel sounds Musculoskeletal: No lower extremity tenderness nor  edema.   Neurologic:  Normal speech and language.  Skin:  Skin is warm, dry and intact.  Psychiatric: Mood and affect are normal.   ____________________________________________   LABS (all labs ordered are listed, but only abnormal results are displayed)  Labs Reviewed  CBC - Abnormal; Notable for the following:       Result Value   WBC 11.1 (*)    All other components within normal limits  URINALYSIS, COMPLETE (UACMP) WITH MICROSCOPIC - Abnormal; Notable for the following:    Color, Urine YELLOW (*)    APPearance CLEAR (*)    Bacteria, UA RARE (*)    Squamous Epithelial / LPF 0-5 (*)    All other components within normal limits  LIPASE, BLOOD  COMPREHENSIVE METABOLIC PANEL   ____________________________________________  EKG  none ____________________________________________  RADIOLOGY  Ct Abdomen Pelvis W Contrast  Result Date: 12/20/2016 CLINICAL DATA:  26 y/o  M; right lower abdominal pain. EXAM: CT ABDOMEN AND PELVIS WITH CONTRAST TECHNIQUE: Multidetector CT imaging of the abdomen and pelvis was performed using the standard protocol following bolus administration of intravenous  contrast. CONTRAST:  ISOVUE-300 IOPAMIDOL (ISOVUE-300) INJECTION 61% COMPARISON:  05/09/2016 CT abdomen and pelvis. FINDINGS: Lower chest: No acute abnormality. Hepatobiliary: No focal liver abnormality is seen. No gallstones, gallbladder wall thickening, or biliary dilatation. Pancreas: Unremarkable. No pancreatic ductal dilatation or surrounding inflammatory changes. Spleen: Normal in size without focal abnormality. Adrenals/Urinary Tract: Normal adrenal glands. Bilateral nonobstructive punctate nephrolithiasis. No hydronephrosis or obstructive uropathy. Normal partially visualized bladder. Stomach/Bowel: Stomach is within normal limits. Appendix appears normal. No evidence of bowel wall thickening, distention, or inflammatory changes. Vascular/Lymphatic: No significant vascular findings are present. No enlarged abdominal or pelvic lymph nodes. Reproductive: Prostate is unremarkable. Other: No abdominal wall hernia or abnormality. No abdominopelvic ascites. Musculoskeletal: Bilateral hip replacement, no apparent hardware related complication. Transitional S1 vertebral body. No acute fracture. IMPRESSION: 1. No acute process identified. 2. Bilateral nonobstructive punctate nephrolithiasis. No hydronephrosis or ureter stone. 3. Additional stable chronic changes as above. Electronically Signed   By: Mitzi Hansen M.D.   On: 12/20/2016 01:46    ____________________________________________   PROCEDURES  Procedure(s) performed: None  Procedures  Critical Care performed: No  ____________________________________________   INITIAL IMPRESSION / ASSESSMENT AND PLAN / ED COURSE  Pertinent labs & imaging results that were available during my care of the patient were reviewed by me and considered in my medical decision making (see chart for details).  This is a 26 year old male who comes into the hospital today with some right lower quadrant abdominal pain.  My differential diagnosis  includes appendicitis, colitis, constipation, kidney stone.  I will give the patient a dose of morphine and Zofran as well as liter of normal saline. Given the fact that the patient's pain is in his right lower quadrant and he does have a white blood cell count of 11.1 I will send him for a contrasted CT scan looking at his appendix. I am also awaiting the results of his urinalysis. The patient will be reassessed.     ----------------------------------------- 2:01 AM on 12/20/2016 -----------------------------------------  The patient's CT scan results came back unremarkable. The patient's appendix is unremarkable and he doesn't have any bowel wall thickening. He does have some nonobstructive kidney stones but no ureteral stones. I will give the patient a shot of Toradol and he will be discharged home to follow-up with his primary care physician. ____________________________________________   FINAL CLINICAL IMPRESSION(S) / ED DIAGNOSES  Final  diagnoses:  Right lower quadrant abdominal pain      NEW MEDICATIONS STARTED DURING THIS VISIT:  New Prescriptions   No medications on file     Note:  This document was prepared using Dragon voice recognition software and may include unintentional dictation errors.    Rebecka Apley, MD 12/20/16 [phone]

## 2016-12-19 NOTE — ED Triage Notes (Signed)
Patient reports right lower abdomen pain for several days with diarrhea that has gotten worse.

## 2016-12-20 ENCOUNTER — Encounter: Payer: Self-pay | Admitting: Radiology

## 2016-12-20 ENCOUNTER — Emergency Department: Payer: Medicare Other

## 2016-12-20 DIAGNOSIS — R109 Unspecified abdominal pain: Secondary | ICD-10-CM | POA: Diagnosis not present

## 2016-12-20 LAB — URINALYSIS, COMPLETE (UACMP) WITH MICROSCOPIC
BILIRUBIN URINE: NEGATIVE
GLUCOSE, UA: NEGATIVE mg/dL
Hgb urine dipstick: NEGATIVE
Ketones, ur: NEGATIVE mg/dL
Leukocytes, UA: NEGATIVE
NITRITE: NEGATIVE
PH: 5 (ref 5.0–8.0)
Protein, ur: NEGATIVE mg/dL
RBC / HPF: NONE SEEN RBC/hpf (ref 0–5)
Specific Gravity, Urine: 1.015 (ref 1.005–1.030)

## 2016-12-20 MED ORDER — IOPAMIDOL (ISOVUE-300) INJECTION 61%
100.0000 mL | Freq: Once | INTRAVENOUS | Status: AC | PRN
  Administered 2016-12-20: 100 mL via INTRAVENOUS

## 2016-12-20 MED ORDER — KETOROLAC TROMETHAMINE 30 MG/ML IJ SOLN
30.0000 mg | Freq: Once | INTRAMUSCULAR | Status: AC
  Administered 2016-12-20: 30 mg via INTRAVENOUS
  Filled 2016-12-20: qty 1

## 2016-12-20 NOTE — ED Notes (Signed)
Pt ambulatory with steady gait to bathroom

## 2016-12-20 NOTE — Discharge Instructions (Signed)
Please follow up with your primary care physician for further evaluation of your RLQ abd pain.

## 2016-12-20 NOTE — ED Notes (Signed)
Dr webster in to followup 

## 2016-12-20 NOTE — ED Notes (Signed)
Pt waiting patiently for CT scan; says pain is 8/10; notified Dr Zenda Alpers, no new orders given at this time

## 2016-12-20 NOTE — ED Notes (Signed)
Pt just back from CT. 

## 2016-12-20 NOTE — ED Notes (Addendum)
Pt using call bell; pt says he's done with oral contrast for CT scan; Ben in radiology notified; pt given another blanket as requested

## 2017-01-06 IMAGING — CT CT RENAL STONE PROTOCOL
2 of 4 series · 17 of 46 positions shown, 19 images · non-contrast
Comparison: 12/13/2015

CLINICAL DATA: Right flank pain for 2 weeks with hematuria

EXAM:
CT ABDOMEN AND PELVIS WITHOUT CONTRAST
TECHNIQUE: Multidetector CT imaging of the abdomen and pelvis was performed
following the standard protocol without IV contrast.

[Series 2: axial st · axial · 0.91mm/px · z∈[-539,-84]mm · 14 of 101 slices shown, 16 images]
[im 5/101  soft-tissue]
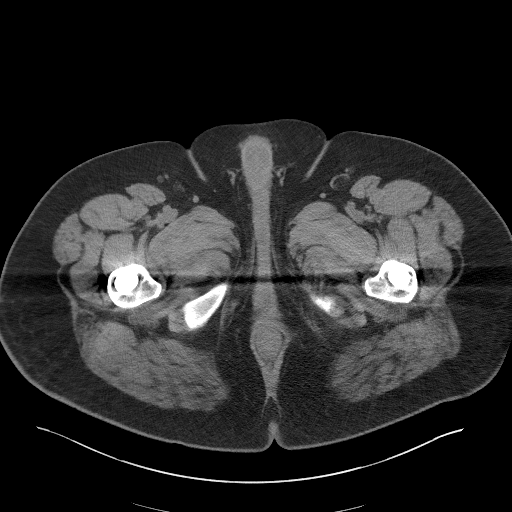
[im 5/101  bone]
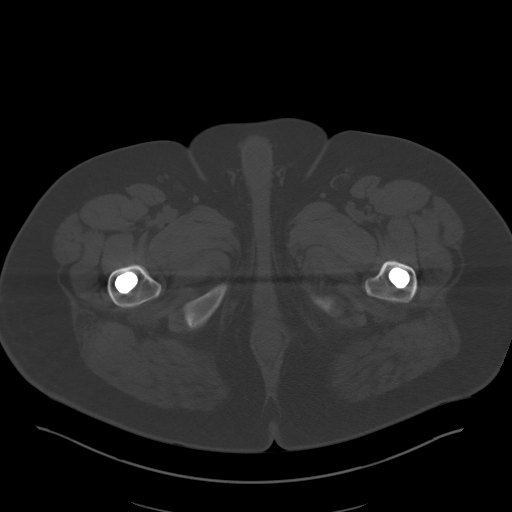
[im 14/101  soft-tissue]
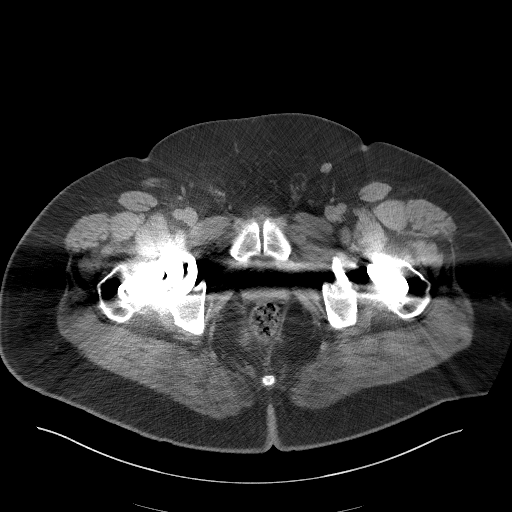
[im 18/101  soft-tissue]
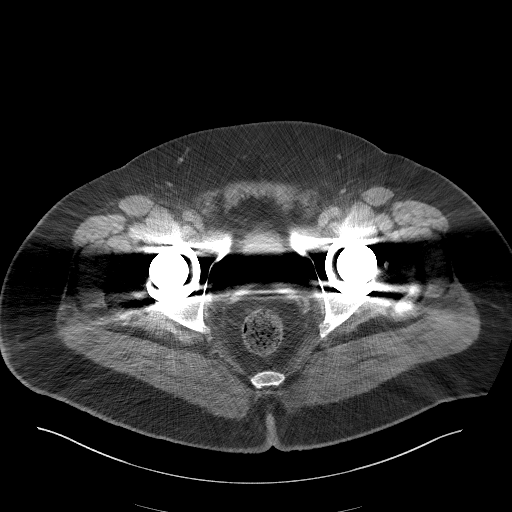
[im 27/101  soft-tissue]
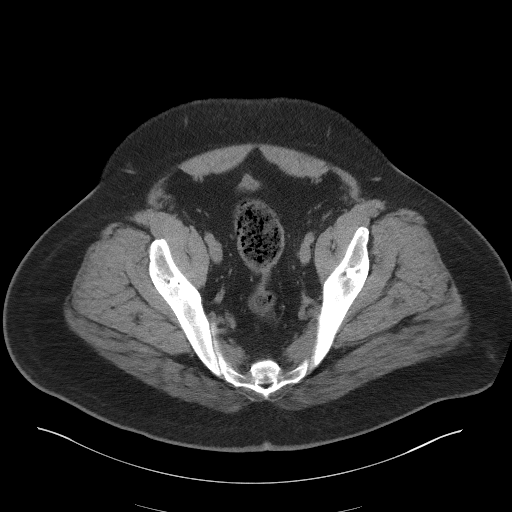
[im 35/101  soft-tissue]
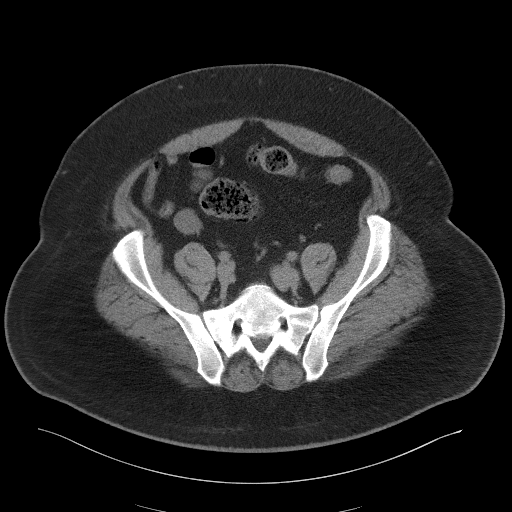
[im 40/101  soft-tissue]
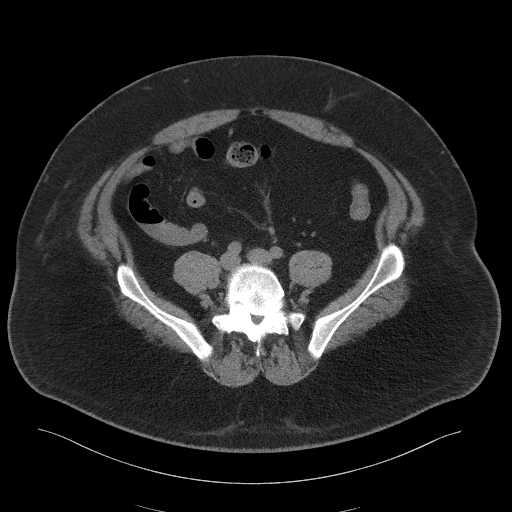
[im 48/101  soft-tissue]
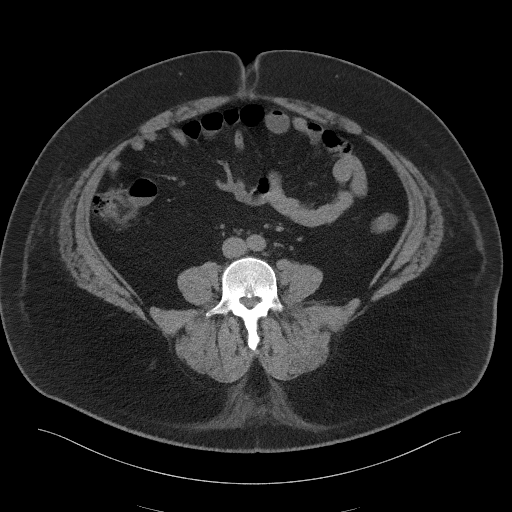
[im 53/101  soft-tissue]
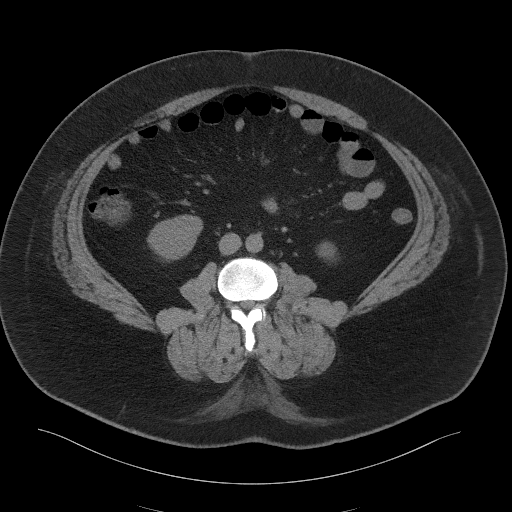
[im 61/101  soft-tissue]
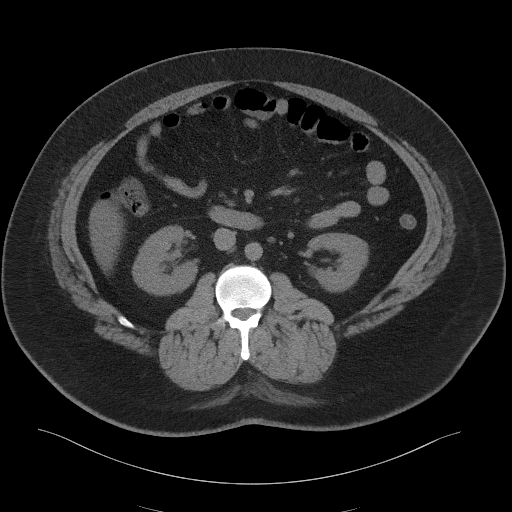
[im 61/101  bone]
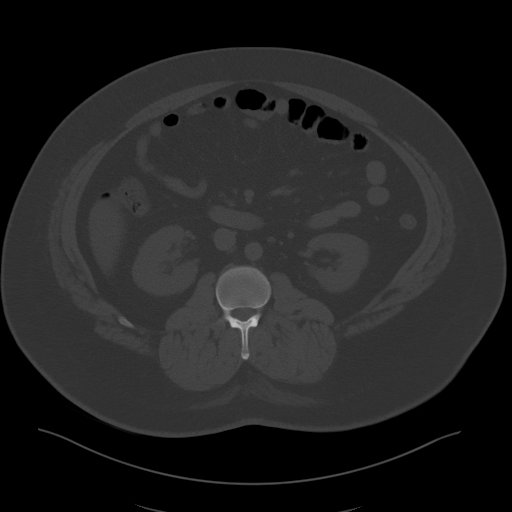
[im 66/101  soft-tissue]
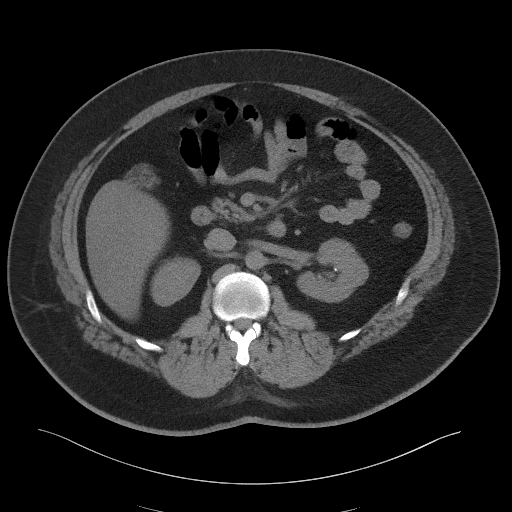
[im 74/101  soft-tissue]
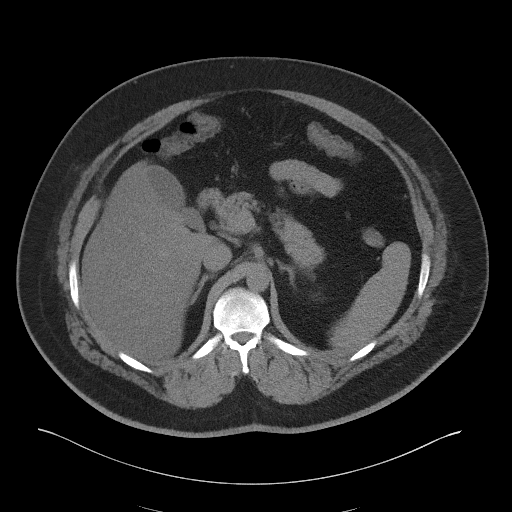
[im 83/101  soft-tissue]
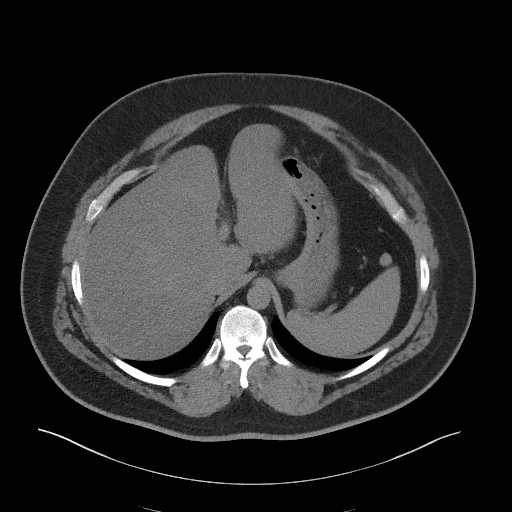
[im 87/101  soft-tissue]
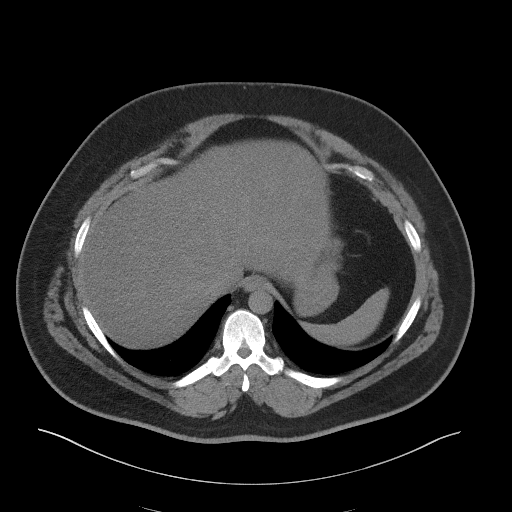
[im 96/101  soft-tissue]
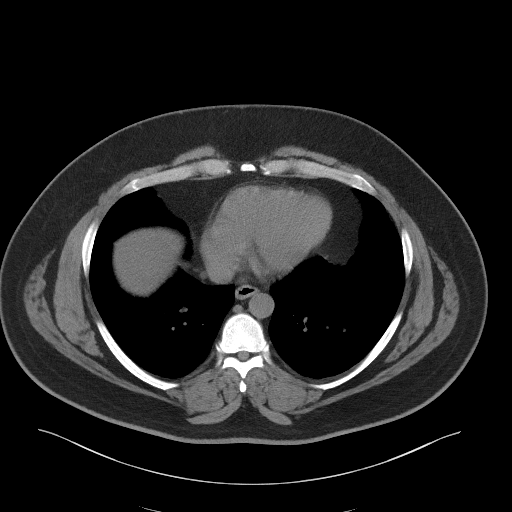

[Series 3: coronal st · coronal · 0.97mm/px · 3 of 115 slices shown]
[im 39/115  soft-tissue]
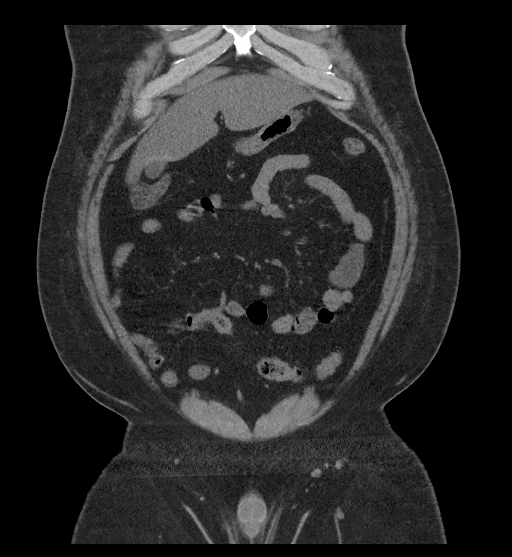
[im 51/115  soft-tissue]
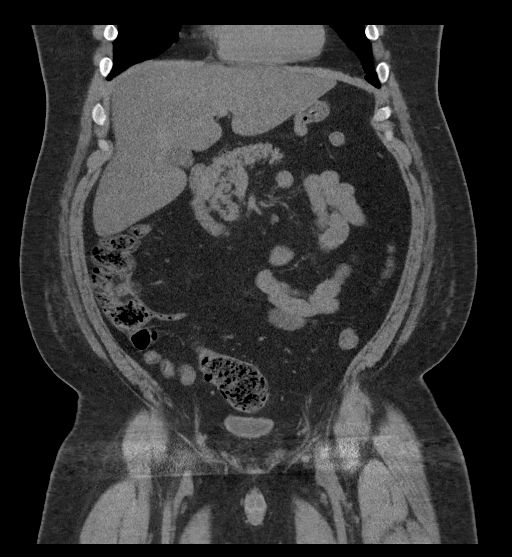
[im 64/115  soft-tissue]
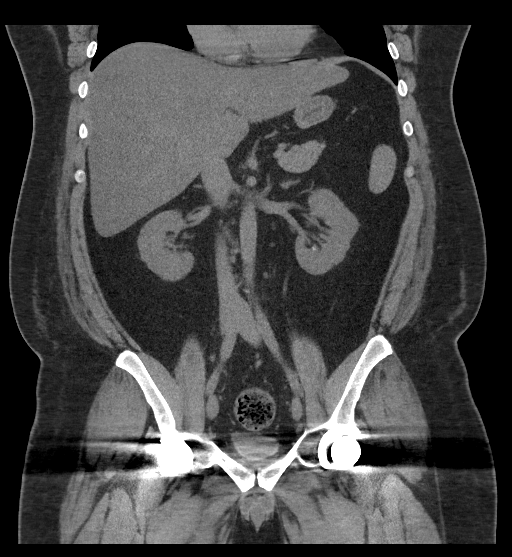

[17 of 46 positions shown; findings below may reference images not displayed]

FINDINGS: Lower chest: No acute abnormality.

Hepatobiliary: The liver is diffusely fatty infiltrated. The
gallbladder is within normal limits.

Pancreas: Unremarkable. No pancreatic ductal dilatation or
surrounding inflammatory changes.

Spleen: Normal in size without focal abnormality.

Adrenals/Urinary Tract: Kidneys are well visualized bilaterally.
Bilateral renal calculi are noted. Largest of these measures 6 mm
and lies in the lower pole of the right kidney. The previously seen
calcification projecting in the region of the right proximal ureter
is not well appreciated. No obstructive changes are seen. The
bladder is partially distended.

Stomach/Bowel: The appendix is within normal limits. No obstructive
changes are seen. No significant diverticular changes noted.

Vascular/Lymphatic: No significant vascular findings are present. No
enlarged abdominal or pelvic lymph nodes.

Reproductive: Prostate is unremarkable.

Other: No abdominal wall hernia or abnormality. No abdominopelvic
ascites.

Musculoskeletal: Bilateral hip replacements are noted. No other bony
abnormality is seen.
IMPRESSION: Bilateral nonobstructing renal calculi as described. Previously
suggested proximal right ureteral stone is not identified and may
have passed.

Fatty liver.

No other focal abnormality is seen.

## 2017-03-21 DIAGNOSIS — J209 Acute bronchitis, unspecified: Secondary | ICD-10-CM | POA: Diagnosis not present

## 2017-03-26 ENCOUNTER — Emergency Department (HOSPITAL_COMMUNITY)
Admission: EM | Admit: 2017-03-26 | Discharge: 2017-03-26 | Disposition: A | Discharge status: Home / Self Care | Payer: Medicare Other

## 2017-03-26 DIAGNOSIS — R109 Unspecified abdominal pain: Secondary | ICD-10-CM | POA: Diagnosis not present

## 2017-03-26 DIAGNOSIS — R1030 Lower abdominal pain, unspecified: Secondary | ICD-10-CM | POA: Diagnosis not present

## 2017-03-26 DIAGNOSIS — Z87891 Personal history of nicotine dependence: Secondary | ICD-10-CM | POA: Diagnosis not present

## 2017-03-26 DIAGNOSIS — Z87442 Personal history of urinary calculi: Secondary | ICD-10-CM | POA: Diagnosis not present

## 2017-03-26 DIAGNOSIS — Z79899 Other long term (current) drug therapy: Secondary | ICD-10-CM | POA: Diagnosis not present

## 2017-03-26 DIAGNOSIS — N200 Calculus of kidney: Secondary | ICD-10-CM | POA: Diagnosis not present

## 2017-03-26 NOTE — ED Triage Notes (Signed)
Pt reported after asking time of wait  Reports he will go to the liquor store and deal with his kidney stone

## 2017-03-29 DIAGNOSIS — Z79891 Long term (current) use of opiate analgesic: Secondary | ICD-10-CM | POA: Diagnosis not present

## 2017-05-13 DIAGNOSIS — H6692 Otitis media, unspecified, left ear: Secondary | ICD-10-CM | POA: Diagnosis not present

## 2017-05-13 DIAGNOSIS — M545 Low back pain: Secondary | ICD-10-CM | POA: Diagnosis not present

## 2017-05-13 DIAGNOSIS — I10 Essential (primary) hypertension: Secondary | ICD-10-CM | POA: Diagnosis not present

## 2017-06-21 DIAGNOSIS — K210 Gastro-esophageal reflux disease with esophagitis: Secondary | ICD-10-CM | POA: Diagnosis not present

## 2017-06-21 DIAGNOSIS — E669 Obesity, unspecified: Secondary | ICD-10-CM | POA: Diagnosis not present

## 2017-06-21 DIAGNOSIS — M545 Low back pain: Secondary | ICD-10-CM | POA: Diagnosis not present

## 2017-06-21 DIAGNOSIS — I10 Essential (primary) hypertension: Secondary | ICD-10-CM | POA: Diagnosis not present

## 2017-07-09 ENCOUNTER — Emergency Department
Admission: EM | Admit: 2017-07-09 | Discharge: 2017-07-09 | Disposition: A | Discharge status: Home / Self Care | Payer: Medicare Other | Attending: Emergency Medicine | Admitting: Emergency Medicine

## 2017-07-09 ENCOUNTER — Other Ambulatory Visit: Payer: Self-pay

## 2017-07-09 ENCOUNTER — Emergency Department: Payer: Medicare Other

## 2017-07-09 DIAGNOSIS — Z79899 Other long term (current) drug therapy: Secondary | ICD-10-CM | POA: Diagnosis not present

## 2017-07-09 DIAGNOSIS — I10 Essential (primary) hypertension: Secondary | ICD-10-CM | POA: Insufficient documentation

## 2017-07-09 DIAGNOSIS — R109 Unspecified abdominal pain: Secondary | ICD-10-CM | POA: Diagnosis not present

## 2017-07-09 DIAGNOSIS — Z87891 Personal history of nicotine dependence: Secondary | ICD-10-CM | POA: Diagnosis not present

## 2017-07-09 DIAGNOSIS — R319 Hematuria, unspecified: Secondary | ICD-10-CM | POA: Insufficient documentation

## 2017-07-09 DIAGNOSIS — N200 Calculus of kidney: Secondary | ICD-10-CM | POA: Diagnosis not present

## 2017-07-09 DIAGNOSIS — R1032 Left lower quadrant pain: Secondary | ICD-10-CM | POA: Diagnosis present

## 2017-07-09 LAB — COMPREHENSIVE METABOLIC PANEL
ALT: 27 U/L (ref 17–63)
AST: 34 U/L (ref 15–41)
Albumin: 4.4 g/dL (ref 3.5–5.0)
Alkaline Phosphatase: 57 U/L (ref 38–126)
Anion gap: 10 (ref 5–15)
BILIRUBIN TOTAL: 0.8 mg/dL (ref 0.3–1.2)
BUN: 9 mg/dL (ref 6–20)
CO2: 21 mmol/L — ABNORMAL LOW (ref 22–32)
CREATININE: 0.97 mg/dL (ref 0.61–1.24)
Calcium: 9.4 mg/dL (ref 8.9–10.3)
Chloride: 107 mmol/L (ref 101–111)
Glucose, Bld: 113 mg/dL — ABNORMAL HIGH (ref 65–99)
Potassium: 3.7 mmol/L (ref 3.5–5.1)
Sodium: 138 mmol/L (ref 135–145)
TOTAL PROTEIN: 7.4 g/dL (ref 6.5–8.1)

## 2017-07-09 LAB — CBC
HCT: 45.7 % (ref 40.0–52.0)
HEMOGLOBIN: 15.7 g/dL (ref 13.0–18.0)
MCH: 34.4 pg — ABNORMAL HIGH (ref 26.0–34.0)
MCHC: 34.4 g/dL (ref 32.0–36.0)
MCV: 100.1 fL — ABNORMAL HIGH (ref 80.0–100.0)
PLATELETS: 303 10*3/uL (ref 150–440)
RBC: 4.56 MIL/uL (ref 4.40–5.90)
RDW: 13.6 % (ref 11.5–14.5)
WBC: 7 10*3/uL (ref 3.8–10.6)

## 2017-07-09 LAB — URINALYSIS, ROUTINE W REFLEX MICROSCOPIC
Bilirubin Urine: NEGATIVE
Glucose, UA: NEGATIVE mg/dL
HGB URINE DIPSTICK: NEGATIVE
KETONES UR: NEGATIVE mg/dL
LEUKOCYTES UA: NEGATIVE
Nitrite: NEGATIVE
PH: 7 (ref 5.0–8.0)
Protein, ur: NEGATIVE mg/dL
Specific Gravity, Urine: 1.017 (ref 1.005–1.030)

## 2017-07-09 MED ORDER — KETOROLAC TROMETHAMINE 10 MG PO TABS: 10.0000 mg | ORAL_TABLET | Freq: Four times a day (QID) | ORAL | 0 refills | Status: DC | PRN

## 2017-07-09 MED ORDER — KETOROLAC TROMETHAMINE 10 MG PO TABS
ORAL_TABLET | ORAL | Status: AC
  Filled 2017-07-09: qty 1

## 2017-07-09 MED ORDER — KETOROLAC TROMETHAMINE 10 MG PO TABS
10.0000 mg | ORAL_TABLET | Freq: Once | ORAL | Status: AC
  Administered 2017-07-09: 10 mg via ORAL

## 2017-07-09 NOTE — ED Notes (Signed)
Pt alert and oriented X4, active, cooperative, pt in NAD. RR even and unlabored, color WNL.  Pt informed to return if any life threatening symptoms occur.  Discharge and followup instructions reviewed.  

## 2017-07-09 NOTE — ED Notes (Signed)
ED Provider at bedside. 

## 2017-07-09 NOTE — ED Triage Notes (Signed)
Pt states that he had a kidney stone last week and states now is having pain to the left side of his abd and left testicle. Pt reports that he gets stones ofen, states that he passed a blood clot this am

## 2017-07-09 NOTE — ED Provider Notes (Signed)
[facility] Emergency Department Provider Note    First MD Initiated Contact with Patient 07/09/17 1211     (approximate)  I have reviewed the triage vital signs and the nursing notes.   HISTORY  Chief Complaint Flank Pain and Abdominal Pain    HPI Jorge Nichols is a 27 y.o. male with the lowest chronic medical conditions presents to the emergency department withleft flank pain with noted hematuria this morning. Patient states that he urinated out a clot this morning. She states is current pain score is only 3 out of 10.   Past Medical History:  Diagnosis Date  . Anxiety   . Chronic headaches   . Chronic hip pain   . Hematuria   . History of avascular necrosis of capital femoral epiphysis    bilateral hip s/p  replacement's  . Hypertension   . Mixed dyslipidemia   . Nephrolithiasis    bilateral  non-obstuctive per ct 12-25-2015  . Other hyperparathyroidism (HCC)   . Right ureteral stone   . Vitamin D deficiency   . Weak urinary stream     Patient Active Problem List   Diagnosis Date Noted  . Vitamin D deficiency 06/21/2015  . Other hyperparathyroidism (HCC) 06/05/2015  . BP (high blood pressure) 05/13/2015  . Calculus of kidney 05/13/2015  . Arthralgia of hip 12/04/2013  . Iliopsoas bursitis 12/04/2013  . History of repair of hip joint 07/03/2013  . Avascular necrosis of bone (HCC) 03/20/2013  . Mixed dyslipidemia 09/09/2012  . Metabolic syndrome 09/09/2012  . Morbid obesity (HCC) 09/09/2012  . Sleep disorder breathing 09/09/2012  . Family history of premature CAD 09/09/2012    Past Surgical History:  Procedure Laterality Date  . CYSTOSCOPY WITH URETEROSCOPY, STONE BASKETRY AND STENT PLACEMENT Right 01/02/2016   Procedure: CYSTOSCOPY WITH RIGHT URETEROSCOPY, STONE EXTRACTION  AND STENT PLACEMENT;  Surgeon: Bjorn PippinJohn Wrenn, MD;  [facility] [place];  Service: Urology;  Laterality: Right;  . EXTRACORPOREAL SHOCK WAVE  LITHOTRIPSY  yrs ago  . HIP SURGERY Bilateral 2015  . HOLMIUM LASER APPLICATION Right 01/02/2016   Procedure: HOLMIUM LASER APPLICATION;  Surgeon: Bjorn PippinJohn Wrenn, MD;  [facility] [place];  Service: Urology;  Laterality: Right;  . TOTAL HIP ARTHROPLASTY Bilateral 2015  . URETEROLITHOTOMY  x2  last one 2007    Prior to Admission medications   Medication Sig Start Date End Date Taking? Authorizing Provider  ALPRAZolam Prudy Feeler(XANAX) 0.5 MG tablet Take 0.5 mg by mouth 2 (two) times daily as needed for anxiety.  08/01/16   [provider]  Aspirin-Salicylamide-Caffeine (ARTHRITIS STRENGTH BC POWDER PO) Take 1 Package by mouth daily as needed (pain).    [provider]  baclofen (LIORESAL) 20 MG tablet Take 20 mg by mouth 2 (two) times daily as needed for muscle spasms.  07/30/16   [provider]  HYDROcodone-acetaminophen (NORCO/VICODIN) 5-325 MG tablet Take 1 tablet by mouth every 6 (six) hours as needed for moderate pain.     [provider]  ibuprofen (ADVIL,MOTRIN) 800 MG tablet Take 1 tablet (800 mg total) by mouth every 8 (eight) hours as needed. 11/27/16   Emily FilbertWilliams, Jonathan E, MD  metoprolol (LOPRESSOR) 50 MG tablet Take 50 mg by mouth 2 (two) times daily.    [provider]  naproxen (NAPROSYN) 500 MG tablet Take 1 tablet (500 mg total) by mouth 2 (two) times daily. 10/20/16   Horton, Mayer Maskerourtney F, MD  ondansetron (ZOFRAN ODT) 4 MG disintegrating  tablet Take 1 tablet (4 mg total) by mouth every 8 (eight) hours as needed for nausea or vomiting. 10/20/16   Horton, Mayer Masker, MD  potassium citrate (UROCIT-K) 10 MEQ (1080 MG) SR tablet Take 10 mEq by mouth 3 (three) times daily. 07/02/15   [provider]  predniSONE (STERAPRED UNI-PAK 21 TAB) 10 MG (21) TBPK tablet Take by mouth daily. dispense steroid taper pack as directed 11/27/16   Emily Filbert, MD  tamsulosin (FLOMAX) 0.4 MG CAPS capsule Take 1 capsule (0.4 mg total) by mouth  daily. 02/03/16   Gilda Crease, MD  Vitamin D, Ergocalciferol, (DRISDOL) 50000 units CAPS capsule Take 1 capsule (50,000 Units total) by mouth every 7 (seven) days. Patient not taking: Reported on 08/12/2016 10/23/15   Roma Kayser, MD    Allergies no known drug allergies  Family History  Problem Relation Age of Onset  . Hypertension Father     Social History Social History   Tobacco Use  . Smoking status: Former Smoker    Packs/day: 0.50    Years: 15.00    Pack years: 7.50  . Smokeless tobacco: Current User    Types: Snuff  Substance Use Topics  . Alcohol use: Yes    Comment: occassionally  . Drug use: No    Review of Systems Constitutional: No fever/chills Eyes: No visual changes. ENT: No sore throat. Cardiovascular: Denies chest pain. Respiratory: Denies shortness of breath. Gastrointestinal: positive for left flank discomfort..  No nausea, no vomiting.  No diarrhea.  No constipation. Genitourinary: Negative for dysuria.positive for hematuria Musculoskeletal: Negative for neck pain.  Negative for back pain. Integumentary: Negative for rash. Neurological: Negative for headaches, focal weakness or numbness.   ____________________________________________   PHYSICAL EXAM:  VITAL SIGNS: ED Triage Vitals  Enc Vitals Group     BP 07/09/17 0941 (!) 142/84     Pulse Rate 07/09/17 1230 86     Resp 07/09/17 0941 18     Temp 07/09/17 0941 98 F (36.7 C)     Temp Source 07/09/17 0941 Oral     SpO2 07/09/17 0941 100 %     Weight 07/09/17 0955 113.4 kg (250 lb)     Height 07/09/17 0955 1.727 m (5\' 8" )     Head Circumference --      Peak Flow --      Pain Score 07/09/17 0955 5     Pain Loc --      Pain Edu? --      Excl. in GC? --     Constitutional: Alert and oriented. Well appearing and in no acute distress. Eyes: Conjunctivae are normal.  Head: Atraumatic. Mouth/Throat: Mucous membranes are moist.  Oropharynx non-erythematous. Neck: No  stridor.   Cardiovascular: Normal rate, regular rhythm. Good peripheral circulation. Grossly normal heart sounds. Respiratory: Normal respiratory effort.  No retractions. Lungs CTAB. Gastrointestinal: Soft and nontender. No distention.  Musculoskeletal: No lower extremity tenderness nor edema. No gross deformities of extremities. Neurologic:  Normal speech and language. No gross focal neurologic deficits are appreciated.  Skin:  Skin is warm, dry and intact. No rash noted.   ____________________________________________   LABS (all labs ordered are listed, but only abnormal results are displayed)  Labs Reviewed  URINALYSIS, ROUTINE W REFLEX MICROSCOPIC - Abnormal; Notable for the following components:      Result Value   Color, Urine YELLOW (*)    APPearance CLEAR (*)    All other components within normal limits  COMPREHENSIVE  METABOLIC PANEL - Abnormal; Notable for the following components:   CO2 21 (*)    Glucose, Bld 113 (*)    All other components within normal limits  CBC - Abnormal; Notable for the following components:   MCV 100.1 (*)    MCH 34.4 (*)    All other components within normal limits    RADIOLOGY I, [place] BR[place], personally viewed and evaluated these images (plain radiographs) as part of my medical decision making, as well as reviewing the written report by the radiologist.  ED MD interpretation:  scattered 1-2 mm calculi noted in each kidney. No large calculus evident per radiologist.  Official radiology report(s): Ct Renal Stone Study  Result Date: 07/09/2017 CLINICAL DATA:  Left flank and scrotal region pain EXAM: CT ABDOMEN AND PELVIS WITHOUT CONTRAST TECHNIQUE: Multidetector CT imaging of the abdomen and pelvis was performed following the standard protocol without oral or IV contrast. COMPARISON:  March 26, 2017 FINDINGS: Lower chest: There is mild atelectatic change in the lateral right base. There is no edema or consolidation. Hepatobiliary: 5 no  focal liver lesions are appreciable on this noncontrast enhanced study. Gallbladder wall is not appreciably thickened. There is no biliary duct dilatation. Pancreas: No pancreatic mass or inflammatory focus. Spleen: No splenic lesions are evident. A small accessory spleen is noted anterior to the spleen. Adrenals/Urinary Tract: Adrenals bilaterally appear normal. Kidneys bilaterally show no evident mass or hydronephrosis on either side. There are multiple scattered 1-2 mm calculi throughout both kidneys, a finding also present on prior study without appreciable change. No larger calculi evident on either side. There is no apparent ureteral calculus on either side. Urinary bladder is midline with wall thickness within normal limits. Stomach/Bowel: There is no appreciable bowel wall or mesenteric thickening. There is no evident bowel obstruction. No free air or portal venous air. Vascular/Lymphatic: There is no abdominal aortic aneurysm. No vascular lesions are appreciable. No adenopathy is evident in the abdomen or pelvis. Reproductive: Prostate and seminal vesicles appear normal in size and contour. No evident pelvic mass. Other: Appendix appears normal. There is no ascites or abscess in the abdomen or pelvis. There is a minimal ventral hernia containing only fat. Musculoskeletal: There are total hip replacements bilaterally. There are no blastic or lytic bone lesions. No intramuscular or abdominal wall lesions. IMPRESSION: 1. Scattered 1-2 mm calculi in each kidney. No larger calculi evident. No ureteral calculus or hydronephrosis on either side. 2.  No bowel obstruction.  No abscess.  Appendix appears normal. 3.  Minimal ventral hernia containing only fat. 4.  Total hip prostheses bilaterally. Electronically Signed   By: Bretta Bang III M.D.   On: 07/09/2017 13:20     Procedures   ____________________________________________   INITIAL IMPRESSION / ASSESSMENT AND PLAN / ED COURSE  As part of my  medical decision making, I reviewed the following data within the electronic MEDICAL RECORD NUMBER   27 year old male presenting with above stated history of physical exam concerning for ureterolithiasis. CT scan revealed multiple 1-2 mm calculus bilateral kidneys. Patient given by mouth Toradol emergency department with complete resolution of pain ____________________________________________  FINAL CLINICAL IMPRESSION(S) / ED DIAGNOSES  Final diagnoses:  Kidney stone     MEDICATIONS GIVEN DURING THIS VISIT:  Medications  ketorolac (TORADOL) tablet 10 mg (10 mg Oral Given 07/09/17 1239)     ED Discharge Orders    None       Note:  This document was prepared using Dragon voice recognition  software and may include unintentional dictation errors.    Darci Current, MD 07/09/17 [phone]

## 2017-07-09 NOTE — ED Notes (Signed)
Family at bedside. 

## 2017-07-09 NOTE — ED Notes (Signed)
Left sided lower abdominal pain that radiates to left flank.

## 2017-11-11 ENCOUNTER — Emergency Department (HOSPITAL_COMMUNITY): Payer: Medicare Other

## 2017-11-11 ENCOUNTER — Other Ambulatory Visit: Payer: Self-pay

## 2017-11-11 ENCOUNTER — Encounter (HOSPITAL_COMMUNITY): Payer: Self-pay | Admitting: Emergency Medicine

## 2017-11-11 ENCOUNTER — Emergency Department (HOSPITAL_COMMUNITY)
Admission: EM | Admit: 2017-11-11 | Discharge: 2017-11-12 | Disposition: A | Discharge status: Home / Self Care | Payer: Medicare Other | Attending: Emergency Medicine | Admitting: Emergency Medicine

## 2017-11-11 DIAGNOSIS — N201 Calculus of ureter: Secondary | ICD-10-CM | POA: Diagnosis not present

## 2017-11-11 DIAGNOSIS — R350 Frequency of micturition: Secondary | ICD-10-CM | POA: Diagnosis not present

## 2017-11-11 DIAGNOSIS — Z96642 Presence of left artificial hip joint: Secondary | ICD-10-CM | POA: Insufficient documentation

## 2017-11-11 DIAGNOSIS — R300 Dysuria: Secondary | ICD-10-CM | POA: Diagnosis not present

## 2017-11-11 DIAGNOSIS — I10 Essential (primary) hypertension: Secondary | ICD-10-CM | POA: Insufficient documentation

## 2017-11-11 DIAGNOSIS — F17228 Nicotine dependence, chewing tobacco, with other nicotine-induced disorders: Secondary | ICD-10-CM | POA: Diagnosis not present

## 2017-11-11 DIAGNOSIS — R110 Nausea: Secondary | ICD-10-CM | POA: Insufficient documentation

## 2017-11-11 DIAGNOSIS — Z96641 Presence of right artificial hip joint: Secondary | ICD-10-CM | POA: Insufficient documentation

## 2017-11-11 DIAGNOSIS — Z79899 Other long term (current) drug therapy: Secondary | ICD-10-CM | POA: Insufficient documentation

## 2017-11-11 DIAGNOSIS — R109 Unspecified abdominal pain: Secondary | ICD-10-CM | POA: Diagnosis present

## 2017-11-11 DIAGNOSIS — R319 Hematuria, unspecified: Secondary | ICD-10-CM | POA: Insufficient documentation

## 2017-11-11 LAB — BASIC METABOLIC PANEL
ANION GAP: 12 (ref 5–15)
BUN: 7 mg/dL (ref 6–20)
CHLORIDE: 104 mmol/L (ref 98–111)
CO2: 22 mmol/L (ref 22–32)
CREATININE: 0.9 mg/dL (ref 0.61–1.24)
Calcium: 9.3 mg/dL (ref 8.9–10.3)
GFR calc Af Amer: >60 mL/min (ref >60)
GFR calc non Af Amer: >60 mL/min (ref >60)
GLUCOSE: 102 mg/dL — AB (ref 70–99)
Potassium: 4 mmol/L (ref 3.5–5.1)
Sodium: 138 mmol/L (ref 135–145)

## 2017-11-11 LAB — URINALYSIS, ROUTINE W REFLEX MICROSCOPIC
BILIRUBIN URINE: NEGATIVE
Bacteria, UA: NONE SEEN
GLUCOSE, UA: NEGATIVE mg/dL
KETONES UR: NEGATIVE mg/dL
LEUKOCYTES UA: NEGATIVE
Nitrite: NEGATIVE
PH: 6 (ref 5.0–8.0)
Protein, ur: NEGATIVE mg/dL
SPECIFIC GRAVITY, URINE: 1.012 (ref 1.005–1.030)
WBC, UA: >50 WBC/hpf — ABNORMAL HIGH (ref 0–5)

## 2017-11-11 MED ORDER — KETOROLAC TROMETHAMINE 30 MG/ML IJ SOLN
15.0000 mg | Freq: Once | INTRAMUSCULAR | Status: AC
  Administered 2017-11-11: 15 mg via INTRAVENOUS
  Filled 2017-11-11: qty 1

## 2017-11-11 MED ORDER — HYDROMORPHONE HCL 1 MG/ML IJ SOLN
1.0000 mg | Freq: Once | INTRAMUSCULAR | Status: AC
  Administered 2017-11-11: 1 mg via INTRAVENOUS
  Filled 2017-11-11: qty 1

## 2017-11-11 MED ORDER — ONDANSETRON HCL 4 MG/2ML IJ SOLN
4.0000 mg | Freq: Once | INTRAMUSCULAR | Status: AC
  Administered 2017-11-11: 4 mg via INTRAVENOUS
  Filled 2017-11-11: qty 2

## 2017-11-11 NOTE — ED Notes (Signed)
Patient transported to X-ray 

## 2017-11-11 NOTE — ED Triage Notes (Addendum)
Pt C/O left sided flank pain that radiates to the left groin. Pt states he has Hx of kidney stones. Denies fevers. Pt also C/O nausea. Pt states he takes 7.5 hydrocodone for back pain and that has not been helping.

## 2017-11-11 NOTE — ED Notes (Signed)
Pt returned from X Ray.

## 2017-11-12 MED ORDER — OXYCODONE-ACETAMINOPHEN 5-325 MG PO TABS
2.0000 | ORAL_TABLET | Freq: Once | ORAL | Status: AC
  Administered 2017-11-12: 2 via ORAL
  Filled 2017-11-12: qty 2

## 2017-11-12 MED ORDER — OXYCODONE-ACETAMINOPHEN 5-325 MG PO TABS: 1.0000 | ORAL_TABLET | ORAL | 0 refills | Status: DC | PRN

## 2017-11-12 NOTE — Discharge Instructions (Addendum)
Strain your urine so you will know when this stone has passed.  You may take the oxycodone samples prescribed for pain relief.  This will make you drowsy - do not drive within 4 hours of taking this medication.  Get rechecked immediately for any worsened pain, fevers, vomiting or other new symptoms.

## 2017-11-12 NOTE — ED Provider Notes (Signed)
[facility] EMERGENCY DEPARTMENT Provider Note   CSN: [id number] Arrival date & time: 11/11/17  2150     History   Chief Complaint Chief Complaint  Patient presents with  . Flank Pain    HPI Jorge Nichols is a 27 y.o. male with a past medical history as outlined below, most significant for history of frequent kidney stones with last CT scan dated 07/09/2017 revealing multiple tiny punctate stones in his bilateral kidneys presenting with a 3-day history of left flank pain, with recent movement today now radiating into his left lower pelvis and groin.  He denies testicular pain. He has hematuria and increased urinary frequency of small amounts of urine.  He denies fevers or chills, endorses nausea without emesis.  He states he generally will pass tiny stones but has had lithotripsy and stent placement in the past for stones that have not passed.  His urologist is Dr. Annabell HowellsWrenn.  He has had no medications prior to arrival for his symptoms.    HPI  Past Medical History:  Diagnosis Date  . Anxiety   . Chronic headaches   . Chronic hip pain   . Hematuria   . History of avascular necrosis of capital femoral epiphysis    bilateral hip s/p  replacement's  . Hypertension   . Mixed dyslipidemia   . Nephrolithiasis    bilateral  non-obstuctive per ct 12-25-2015  . Other hyperparathyroidism (HCC)   . Right ureteral stone   . Vitamin D deficiency   . Weak urinary stream     Patient Active Problem List   Diagnosis Date Noted  . Vitamin D deficiency 06/21/2015  . Other hyperparathyroidism (HCC) 06/05/2015  . BP (high blood pressure) 05/13/2015  . Calculus of kidney 05/13/2015  . Arthralgia of hip 12/04/2013  . Iliopsoas bursitis 12/04/2013  . History of repair of hip joint 07/03/2013  . Avascular necrosis of bone (HCC) 03/20/2013  . Mixed dyslipidemia 09/09/2012  . Metabolic syndrome 09/09/2012  . Morbid obesity (HCC) 09/09/2012  . Sleep disorder breathing 09/09/2012  . Family  history of premature CAD 09/09/2012    Past Surgical History:  Procedure Laterality Date  . CYSTOSCOPY WITH URETEROSCOPY, STONE BASKETRY AND STENT PLACEMENT Right 01/02/2016   Procedure: CYSTOSCOPY WITH RIGHT URETEROSCOPY, STONE EXTRACTION  AND STENT PLACEMENT;  Surgeon: Bjorn PippinJohn Wrenn, MD;  [facility] [place];  Service: Urology;  Laterality: Right;  . EXTRACORPOREAL SHOCK WAVE LITHOTRIPSY  yrs ago  . HIP SURGERY Bilateral 2015  . HOLMIUM LASER APPLICATION Right 01/02/2016   Procedure: HOLMIUM LASER APPLICATION;  Surgeon: Bjorn PippinJohn Wrenn, MD;  [facility] [place];  Service: Urology;  Laterality: Right;  . TOTAL HIP ARTHROPLASTY Bilateral 2015  . URETEROLITHOTOMY  x2  last one 2007        Home Medications    Prior to Admission medications   Medication Sig Start Date End Date Taking? Authorizing Provider  ALPRAZolam Prudy Feeler(XANAX) 0.5 MG tablet Take 0.5 mg by mouth 2 (two) times daily as needed for anxiety.  08/01/16  Yes [provider]  HYDROcodone-acetaminophen (NORCO) 7.5-325 MG tablet Take 1 tablet by mouth every 6 (six) hours as needed for moderate pain.    Yes [provider]  metoprolol (LOPRESSOR) 50 MG tablet Take 50 mg by mouth 2 (two) times daily.   Yes [provider]  oxyCODONE-acetaminophen (PERCOCET/ROXICET) 5-325 MG tablet Take 1-2 tablets by mouth every 4 (four) hours as needed for severe pain. 11/12/17   Burgess AmorIdol, Nichola Warren, PA-C  Family History Family History  Problem Relation Age of Onset  . Hypertension Father     Social History Social History   Tobacco Use  . Smoking status: Former Smoker    Packs/day: 0.50    Years: 15.00    Pack years: 7.50  . Smokeless tobacco: Current User    Types: Snuff  Substance Use Topics  . Alcohol use: Yes    Comment: occassionally  . Drug use: No     Allergies   Patient has no known allergies.   Review of Systems Review of Systems  Constitutional: Negative for chills and  fever.  HENT: Negative for congestion and sore throat.   Eyes: Negative.   Respiratory: Negative for chest tightness and shortness of breath.   Cardiovascular: Negative for chest pain.  Gastrointestinal: Positive for nausea. Negative for abdominal pain and vomiting.  Genitourinary: Positive for dysuria and hematuria. Negative for discharge, penile pain and testicular pain.  Musculoskeletal: Negative for arthralgias, joint swelling and neck pain.  Skin: Negative.  Negative for rash and wound.  Neurological: Negative for dizziness, weakness, light-headedness, numbness and headaches.  Psychiatric/Behavioral: Negative.      Physical Exam Updated Vital Signs BP 113/83 (BP Location: Left Arm)   Pulse (!) 105   Temp 97.9 F (36.6 C) (Oral)   Resp 18   Ht 5\' 8"  (1.727 m)   Wt 108.9 kg   SpO2 95%   BMI 36.49 kg/m   Physical Exam  Constitutional: He appears well-developed and well-nourished.  HENT:  Head: Normocephalic and atraumatic.  Eyes: Conjunctivae are normal.  Neck: Normal range of motion.  Cardiovascular: Normal rate, regular rhythm, normal heart sounds and intact distal pulses.  Pulmonary/Chest: Effort normal and breath sounds normal. He has no wheezes.  Abdominal: Soft. Bowel sounds are normal. He exhibits no mass. There is no tenderness. There is no guarding and no CVA tenderness.  Musculoskeletal: Normal range of motion.  Neurological: He is alert.  Skin: Skin is warm and dry.  Psychiatric: He has a normal mood and affect.  Nursing note and vitals reviewed.    ED Treatments / Results  Labs (all labs ordered are listed, but only abnormal results are displayed) Labs Reviewed  URINALYSIS, ROUTINE W REFLEX MICROSCOPIC - Abnormal; Notable for the following components:      Result Value   Hgb urine dipstick LARGE (*)    RBC / HPF >50 (*)    WBC, UA >50 (*)    All other components within normal limits  BASIC METABOLIC PANEL - Abnormal; Notable for the following  components:   Glucose, Bld 102 (*)    All other components within normal limits    EKG None  Radiology Dg Abdomen 1 View  Result Date: 11/12/2017 CLINICAL DATA:  LEFT flank pain since yesterday, radiating to LEFT groin. History of nephrolithiasis, hip arthroplasty. EXAM: ABDOMEN - 1 VIEW COMPARISON:  CT abdomen and pelvis July 09, 2017 FINDINGS: Numerous small calcifications projecting in the kidneys bilaterally. No intra-abdominal mass effect or pathologic calcifications. Small calcification projecting LEFT pelvis. Mild amount of retained large bowel stool, bowel gas pattern is nondilated and nonobstructive. Status post bilateral total hip arthroplasties. IMPRESSION: 1. Bilateral nephrolithiasis. LEFT pelvic urolithiasis versus phleboliths. 2. Normal bowel gas pattern. Electronically Signed   By: Awilda Metro M.D.   On: 11/12/2017 00:08    Procedures Procedures (including critical care time)  Medications Ordered in ED Medications  HYDROmorphone (DILAUDID) injection 1 mg (1 mg Intravenous Given 11/11/17 2314)  ketorolac (TORADOL) 30 MG/ML injection 15 mg (15 mg Intravenous Given 11/11/17 2314)  ondansetron (ZOFRAN) injection 4 mg (4 mg Intravenous Given 11/11/17 2314)  oxyCODONE-acetaminophen (PERCOCET/ROXICET) 5-325 MG per tablet 2 tablet (2 tablets Oral Given 11/12/17 0040)     Initial Impression / Assessment and Plan / ED Course  I have reviewed the triage vital signs and the nursing notes.  Pertinent labs & imaging results that were available during my care of the patient were reviewed by me and considered in my medical decision making (see chart for details).     During ed visit, pt urinated and his pain localized to his penis, stating he believes the next time he urinates he will pass this stone.  He tolerated PO intake here. No fevers, no urinary infection, culture ordered.  He was given oxycodone pre pack, urine strainer, plan f/u with Dr. Annabell Howells, return precautions  outlined.  Prior to dc pt passed the stone with significant relief of pain.  Final Clinical Impressions(s) / ED Diagnoses   Final diagnoses:  Ureterolithiasis    ED Discharge Orders         Ordered    oxyCODONE-acetaminophen (PERCOCET/ROXICET) 5-325 MG tablet  Every 4 hours PRN     11/12/17 0100           Burgess Amor, PA-C 11/12/17 0114    Donnetta Hutching, MD 11/13/17 [phone]

## 2017-11-12 NOTE — ED Notes (Signed)
Pt states he can feel the stone moving toward penis and would like to try to pass it. Pt provided urine strainer and multiple cups of water.

## 2017-11-12 NOTE — ED Notes (Signed)
Pt Passed Stone and caught it in Technical brewerstrainer.

## 2017-11-12 NOTE — ED Notes (Signed)
ED Provider at bedside. 

## 2017-11-13 LAB — URINE CULTURE
Culture: NO GROWTH
Special Requests: NORMAL

## 2017-11-15 MED FILL — Oxycodone w/ Acetaminophen Tab 5-325 MG: ORAL | Qty: 6 | Status: AC

## 2018-01-25 DIAGNOSIS — J029 Acute pharyngitis, unspecified: Secondary | ICD-10-CM | POA: Diagnosis not present

## 2018-03-22 DIAGNOSIS — K210 Gastro-esophageal reflux disease with esophagitis: Secondary | ICD-10-CM | POA: Diagnosis not present

## 2018-03-22 DIAGNOSIS — F419 Anxiety disorder, unspecified: Secondary | ICD-10-CM | POA: Diagnosis not present

## 2018-03-22 DIAGNOSIS — K529 Noninfective gastroenteritis and colitis, unspecified: Secondary | ICD-10-CM | POA: Diagnosis not present

## 2018-03-22 DIAGNOSIS — M545 Low back pain: Secondary | ICD-10-CM | POA: Diagnosis not present

## 2018-03-30 ENCOUNTER — Emergency Department: Payer: Medicare Other

## 2018-03-30 ENCOUNTER — Other Ambulatory Visit: Payer: Self-pay

## 2018-03-30 ENCOUNTER — Inpatient Hospital Stay
Admission: EM | Admit: 2018-03-30 | Discharge: 2018-04-01 | DRG: 395 | Disposition: A | Discharge status: Home / Self Care | Payer: Medicare Other | Attending: Internal Medicine | Admitting: Internal Medicine

## 2018-03-30 ENCOUNTER — Encounter: Payer: Self-pay | Admitting: Emergency Medicine

## 2018-03-30 DIAGNOSIS — K659 Peritonitis, unspecified: Secondary | ICD-10-CM

## 2018-03-30 DIAGNOSIS — Z6838 Body mass index (BMI) 38.0-38.9, adult: Secondary | ICD-10-CM

## 2018-03-30 DIAGNOSIS — Z79899 Other long term (current) drug therapy: Secondary | ICD-10-CM

## 2018-03-30 DIAGNOSIS — E782 Mixed hyperlipidemia: Secondary | ICD-10-CM | POA: Diagnosis present

## 2018-03-30 DIAGNOSIS — F1010 Alcohol abuse, uncomplicated: Secondary | ICD-10-CM | POA: Diagnosis present

## 2018-03-30 DIAGNOSIS — Z96643 Presence of artificial hip joint, bilateral: Secondary | ICD-10-CM | POA: Diagnosis present

## 2018-03-30 DIAGNOSIS — K429 Umbilical hernia without obstruction or gangrene: Secondary | ICD-10-CM | POA: Diagnosis present

## 2018-03-30 DIAGNOSIS — I10 Essential (primary) hypertension: Secondary | ICD-10-CM | POA: Diagnosis present

## 2018-03-30 DIAGNOSIS — L089 Local infection of the skin and subcutaneous tissue, unspecified: Secondary | ICD-10-CM | POA: Diagnosis not present

## 2018-03-30 DIAGNOSIS — Z720 Tobacco use: Secondary | ICD-10-CM

## 2018-03-30 DIAGNOSIS — K760 Fatty (change of) liver, not elsewhere classified: Secondary | ICD-10-CM | POA: Diagnosis not present

## 2018-03-30 DIAGNOSIS — K654 Sclerosing mesenteritis: Secondary | ICD-10-CM | POA: Diagnosis not present

## 2018-03-30 DIAGNOSIS — F419 Anxiety disorder, unspecified: Secondary | ICD-10-CM | POA: Diagnosis present

## 2018-03-30 LAB — CBC WITH DIFFERENTIAL/PLATELET
Abs Immature Granulocytes: 0.03 10*3/uL (ref 0.00–0.07)
BASOS ABS: 0.1 10*3/uL (ref 0.0–0.1)
Basophils Relative: 1 %
EOS ABS: 0.4 10*3/uL (ref 0.0–0.5)
EOS PCT: 5 %
HEMATOCRIT: 46.8 % (ref 39.0–52.0)
HEMOGLOBIN: 15.4 g/dL (ref 13.0–17.0)
Immature Granulocytes: 0 %
LYMPHS PCT: 24 %
Lymphs Abs: 2.1 10*3/uL (ref 0.7–4.0)
MCH: 33 pg (ref 26.0–34.0)
MCHC: 32.9 g/dL (ref 30.0–36.0)
MCV: 100.2 fL — ABNORMAL HIGH (ref 80.0–100.0)
MONO ABS: 1.1 10*3/uL — AB (ref 0.1–1.0)
Monocytes Relative: 12 %
NRBC: 0 % (ref 0.0–0.2)
Neutro Abs: 5.2 10*3/uL (ref 1.7–7.7)
Neutrophils Relative %: 58 %
Platelets: 289 10*3/uL (ref 150–400)
RBC: 4.67 MIL/uL (ref 4.22–5.81)
RDW: 12.6 % (ref 11.5–15.5)
WBC: 8.9 10*3/uL (ref 4.0–10.5)

## 2018-03-30 LAB — URINALYSIS, COMPLETE (UACMP) WITH MICROSCOPIC
BACTERIA UA: NONE SEEN
Bilirubin Urine: NEGATIVE
GLUCOSE, UA: NEGATIVE mg/dL
HGB URINE DIPSTICK: NEGATIVE
Ketones, ur: NEGATIVE mg/dL
Leukocytes, UA: NEGATIVE
Nitrite: NEGATIVE
PROTEIN: NEGATIVE mg/dL
SQUAMOUS EPITHELIAL / LPF: NONE SEEN (ref 0–5)
Specific Gravity, Urine: 1.024 (ref 1.005–1.030)
pH: 6 (ref 5.0–8.0)

## 2018-03-30 LAB — COMPREHENSIVE METABOLIC PANEL
ALK PHOS: 62 U/L (ref 38–126)
ALT: 40 U/L (ref 0–44)
ANION GAP: 8 (ref 5–15)
AST: 31 U/L (ref 15–41)
Albumin: 3.9 g/dL (ref 3.5–5.0)
BILIRUBIN TOTAL: 0.5 mg/dL (ref 0.3–1.2)
BUN: 10 mg/dL (ref 6–20)
CO2: 25 mmol/L (ref 22–32)
CREATININE: 0.96 mg/dL (ref 0.61–1.24)
Calcium: 9.5 mg/dL (ref 8.9–10.3)
Chloride: 108 mmol/L (ref 98–111)
Glucose, Bld: 110 mg/dL — ABNORMAL HIGH (ref 70–99)
Potassium: 4 mmol/L (ref 3.5–5.1)
Sodium: 141 mmol/L (ref 135–145)
TOTAL PROTEIN: 7.1 g/dL (ref 6.5–8.1)

## 2018-03-30 LAB — URINE DRUG SCREEN, QUALITATIVE (ARMC ONLY)
AMPHETAMINES, UR SCREEN: NOT DETECTED
Barbiturates, Ur Screen: NOT DETECTED
Benzodiazepine, Ur Scrn: POSITIVE — AB
Cannabinoid 50 Ng, Ur ~~LOC~~: NOT DETECTED
Cocaine Metabolite,Ur ~~LOC~~: NOT DETECTED
MDMA (Ecstasy)Ur Screen: NOT DETECTED
Methadone Scn, Ur: NOT DETECTED
Opiate, Ur Screen: POSITIVE — AB
Phencyclidine (PCP) Ur S: NOT DETECTED
Tricyclic, Ur Screen: NOT DETECTED

## 2018-03-30 LAB — LIPASE, BLOOD: Lipase: 44 U/L (ref 11–51)

## 2018-03-30 MED ORDER — ALPRAZOLAM 0.5 MG PO TABS: 0.5000 mg | ORAL_TABLET | Freq: Two times a day (BID) | ORAL | Status: DC | PRN

## 2018-03-30 MED ORDER — ADULT MULTIVITAMIN W/MINERALS CH
1.0000 | ORAL_TABLET | Freq: Every day | ORAL | Status: DC
  Administered 2018-03-30 – 2018-04-01 (×3): 1 via ORAL
  Filled 2018-03-30 (×3): qty 1

## 2018-03-30 MED ORDER — ONDANSETRON HCL 4 MG/2ML IJ SOLN
4.0000 mg | Freq: Once | INTRAMUSCULAR | Status: AC
  Administered 2018-03-30: 4 mg via INTRAVENOUS
  Filled 2018-03-30: qty 2

## 2018-03-30 MED ORDER — IOPAMIDOL (ISOVUE-300) INJECTION 61%
100.0000 mL | Freq: Once | INTRAVENOUS | Status: AC | PRN
  Administered 2018-03-30: 100 mL via INTRAVENOUS

## 2018-03-30 MED ORDER — PIPERACILLIN-TAZOBACTAM 3.375 G IVPB 30 MIN
3.3750 g | Freq: Once | INTRAVENOUS | Status: AC
  Administered 2018-03-30: 3.375 g via INTRAVENOUS
  Filled 2018-03-30: qty 50

## 2018-03-30 MED ORDER — HYDROMORPHONE HCL 1 MG/ML IJ SOLN: 0.5000 mg | INTRAMUSCULAR | Status: DC | PRN

## 2018-03-30 MED ORDER — VITAMIN B-1 100 MG PO TABS
100.0000 mg | ORAL_TABLET | Freq: Every day | ORAL | Status: DC
  Administered 2018-03-30 – 2018-04-01 (×3): 100 mg via ORAL
  Filled 2018-03-30 (×3): qty 1

## 2018-03-30 MED ORDER — SODIUM CHLORIDE 0.9 % IV SOLN
INTRAVENOUS | Status: DC
  Administered 2018-03-30 – 2018-04-01 (×4): via INTRAVENOUS

## 2018-03-30 MED ORDER — ENOXAPARIN SODIUM 40 MG/0.4ML ~~LOC~~ SOLN
40.0000 mg | SUBCUTANEOUS | Status: DC
  Administered 2018-03-30 – 2018-03-31 (×2): 40 mg via SUBCUTANEOUS
  Filled 2018-03-30 (×2): qty 0.4

## 2018-03-30 MED ORDER — PIPERACILLIN-TAZOBACTAM 3.375 G IVPB
3.3750 g | Freq: Three times a day (TID) | INTRAVENOUS | Status: DC
  Administered 2018-03-30 – 2018-03-31 (×4): 3.375 g via INTRAVENOUS
  Filled 2018-03-30 (×4): qty 50

## 2018-03-30 MED ORDER — ONDANSETRON HCL 4 MG/2ML IJ SOLN: 4.0000 mg | Freq: Four times a day (QID) | INTRAMUSCULAR | Status: DC | PRN

## 2018-03-30 MED ORDER — FOLIC ACID 1 MG PO TABS
1.0000 mg | ORAL_TABLET | Freq: Every day | ORAL | Status: DC
  Administered 2018-03-30 – 2018-04-01 (×3): 1 mg via ORAL
  Filled 2018-03-30 (×3): qty 1

## 2018-03-30 MED ORDER — METOPROLOL TARTRATE 50 MG PO TABS
50.0000 mg | ORAL_TABLET | Freq: Two times a day (BID) | ORAL | Status: DC
  Administered 2018-03-30 – 2018-04-01 (×4): 50 mg via ORAL
  Filled 2018-03-30 (×4): qty 1

## 2018-03-30 MED ORDER — HYDROMORPHONE HCL 1 MG/ML IJ SOLN
1.0000 mg | Freq: Once | INTRAMUSCULAR | Status: AC
Start: 2018-03-30 — End: 2018-03-30
  Administered 2018-03-30: 1 mg via INTRAVENOUS
  Filled 2018-03-30: qty 1

## 2018-03-30 MED ORDER — HYDROMORPHONE HCL 1 MG/ML IJ SOLN
1.0000 mg | INTRAMUSCULAR | Status: DC | PRN
Start: 2018-03-30 — End: 2018-03-30
  Administered 2018-03-30: 1 mg via INTRAVENOUS
  Filled 2018-03-30: qty 1

## 2018-03-30 MED ORDER — OXYCODONE-ACETAMINOPHEN 5-325 MG PO TABS
1.0000 | ORAL_TABLET | ORAL | Status: DC | PRN
  Administered 2018-03-30 – 2018-04-01 (×10): 2 via ORAL
  Filled 2018-03-30 (×10): qty 2

## 2018-03-30 MED ORDER — HYDROMORPHONE HCL 1 MG/ML IJ SOLN
0.5000 mg | INTRAMUSCULAR | Status: DC | PRN
  Administered 2018-03-30 – 2018-03-31 (×5): 0.5 mg via INTRAVENOUS
  Filled 2018-03-30 (×5): qty 1

## 2018-03-30 MED ORDER — SODIUM CHLORIDE 0.9 % IV BOLUS
1000.0000 mL | Freq: Once | INTRAVENOUS | Status: AC
Start: 2018-03-30 — End: 2018-03-30
  Administered 2018-03-30: 1000 mL via INTRAVENOUS

## 2018-03-30 MED ORDER — FENTANYL CITRATE (PF) 100 MCG/2ML IJ SOLN
100.0000 ug | Freq: Once | INTRAMUSCULAR | Status: AC
  Administered 2018-03-30: 100 ug via INTRAVENOUS
  Filled 2018-03-30: qty 2

## 2018-03-30 NOTE — ED Triage Notes (Signed)
Patient ambulatory to triage with slow gait, appears uncomfortable; c/o left lower abd pain since awakening at 530am; denies accomp symptoms, denies hx of same

## 2018-03-30 NOTE — Consult Note (Signed)
Subjective:   CC: LLQ pain  HPI:  Jorge Nichols is a 28 y.o. male who was consulted by Sharma CovertNorman for evaluation of  above. First noted this am.  Symptoms include: Pain is sharp, sudden onset, localized to LLQ.  Exacerbated by movement sometimes.  Alleviated slightly by laying on stomach.  Associated with nothing specific.  Cannot recall specific instigating factor.  Denies N/V.  Normal BMs, and no urinary issues.  He endorses having viral gastroenteritis with N/V/D a week ago, but did have a few days prior for which he was asymptomatic.  No previous episode like this    Past Medical History:  has a past medical history of Anxiety, Chronic headaches, Chronic hip pain, Hematuria, History of avascular necrosis of capital femoral epiphysis, Hypertension, Mixed dyslipidemia, Nephrolithiasis, Other hyperparathyroidism (HCC), Right ureteral stone, Vitamin D deficiency, and Weak urinary stream.  Bilateral hip replacement secondary to trauma, no obvious cause for recurrent nephrolithiasis per patient report and no definitive dx of hyperparathyroidism as noted above.  Past Surgical History:  has a past surgical history that includes Total hip arthroplasty (Bilateral, 2015); Extracorporeal shock wave lithotripsy (yrs ago); Ureterolithotomy (x2  last one 2007); Hip surgery (Bilateral, 2015); Cystoscopy with ureteroscopy, stone basketry and stent placement (Right, 01/02/2016); and Holmium laser application (Right, 01/02/2016).  Family History: family history includes Hypertension in his father. breast Cancer in grandmother.  Some sort of cancer in grandfather that required removing one kidney  Social History:  reports that he has quit smoking. He has a 7.50 pack-year smoking history. His smokeless tobacco use includes snuff. He reports current alcohol use. He reports that he does not use drugs.  Current Medications: medications reviewed by myself and includes xanax, lopressor, oxy  Allergies:  No Known  Allergies  ROS:  General: Denies weight loss, weight gain, fatigue, fevers, chills, and night sweats. Eyes: Denies blurry vision, double vision, eye pain, itchy eyes, and tearing. Ears: Denies hearing loss, earache, and ringing in ears. Nose: Denies sinus pain, congestion, infections, runny nose, and nosebleeds. Mouth/throat: Denies hoarseness, sore throat, bleeding gums, and difficulty swallowing. Heart: Denies chest pain, palpitations, racing heart, irregular heartbeat, leg pain or swelling, and decreased activity tolerance. Respiratory: Denies breathing difficulty, shortness of breath, wheezing, cough, and sputum. GI: Denies change in appetite, heartburn, nausea, vomiting, constipation, diarrhea, and blood in stool. GU: Denies difficulty urinating, pain with urinating, urgency, frequency, blood in urine. Musculoskeletal: Denies joint stiffness, pain, swelling, muscle weakness, and pain. Skin: Denies rash, itching, mass, tumors, sores, and boils Neurologic: Denies headache, fainting, dizziness, seizures, numbness, and tingling. Psychiatric: Denies depression, anxiety, difficulty sleeping, and memory loss. Endocrine: Denies heat or cold intolerance, and increased thirst or urination. Blood/lymph: Denies easy bruising, easy bruising, and swollen glands     Objective:     BP 110/77   Pulse 84   Temp 97.7 F (36.5 C) (Oral)   Resp 18   Ht 5\' 8"  (1.727 m)   Wt 113.4 kg   SpO2 99%   BMI 38.01 kg/m   Constitutional :  alert, cooperative, appears stated age and mild distress  Lymphatics/Throat:  no asymmetry, masses, or scars  Respiratory:  clear to auscultation bilaterally  Cardiovascular:  regular rate and rhythm  Gastrointestinal: soft, with focal guarding and tenderness in LLQ, soft, reducible umbilical hernia with minor tenderness, likely radiating from LLQ.  Musculoskeletal: Steady gait and movement  Skin: Cool and moist  Psychiatric: Normal affect, non-agitated, not  confused  LABS:  CMP Latest Ref Rng & Units 03/30/2018 11/11/2017 07/09/2017  Glucose 70 - 99 mg/dL 786(V) 672(C) 947(S)  BUN 6 - 20 mg/dL 10 7 9   Creatinine 0.61 - 1.24 mg/dL 9.62 8.36 6.29  Sodium 135 - 145 mmol/L 141 138 138  Potassium 3.5 - 5.1 mmol/L 4.0 4.0 3.7  Chloride 98 - 111 mmol/L 108 104 107  CO2 22 - 32 mmol/L 25 22 21(L)  Calcium 8.9 - 10.3 mg/dL 9.5 9.3 9.4  Total Protein 6.5 - 8.1 g/dL 7.1 - 7.4  Total Bilirubin 0.3 - 1.2 mg/dL 0.5 - 0.8  Alkaline Phos 38 - 126 U/L 62 - 57  AST 15 - 41 U/L 31 - 34  ALT 0 - 44 U/L 40 - 27   CBC Latest Ref Rng & Units 03/30/2018 07/09/2017 12/19/2016  WBC 4.0 - 10.5 K/uL 8.9 7.0 11.1(H)  Hemoglobin 13.0 - 17.0 g/dL 47.6 54.6 50.3  Hematocrit 39.0 - 52.0 % 46.8 45.7 46.3  Platelets 150 - 400 K/uL 289 303 313    RADS: CLINICAL DATA:  Generalized abdominal pain  EXAM: CT ABDOMEN AND PELVIS WITH CONTRAST  TECHNIQUE: Multidetector CT imaging of the abdomen and pelvis was performed using the standard protocol following bolus administration of intravenous contrast.  CONTRAST:  ISOVUE-300 IOPAMIDOL (ISOVUE-300) INJECTION 61%  COMPARISON:  July 09, 2017.  FINDINGS: Lower chest: There is bibasilar atelectasis. No consolidation in the lung bases evident.  Hepatobiliary: There is hepatic steatosis. No focal liver lesions are appreciable. Gallbladder wall is not appreciably thickened. There is no biliary duct dilatation.  Pancreas: No pancreatic mass or inflammatory focus.  Spleen: No splenic lesions are identified. A small accessory spleen is noted anterior to the spleen.  Adrenals/Urinary Tract: Adrenals bilaterally appear unremarkable. There is a 7 mm cyst in the lower pole of the left kidney. Kidneys bilaterally show no evident hydronephrosis on either side. There are multiple calculi scattered throughout each kidney ranging from 1 mm to as large as 3 mm. Largest calculus is noted in the lower pole of the  right kidney. There is no appreciable ureteral calculus on either side. Urinary bladder is midline with wall thickness within normal limits.  Stomach/Bowel: There is no appreciable bowel wall thickening. There is no appreciable bowel obstruction. No free air or portal venous air.  Vascular/Lymphatic: There is no abdominal aortic aneurysm. No vascular lesions are evident. There is no appreciable adenopathy in the abdomen or pelvis.  Reproductive: Prostate and seminal vesicles appear normal in size and contour. No evident pelvic mass.  Other: There is localized mesenteric thickening in the anterior aspect of the left abdomen at the level of the superior aspect of the iliac crests. This mesenteric thickening is not associated with bowel. It appears to represent localized mesenteric panniculitis, potentially localized sclerosing mesenteritis. It spans an area of approximately 3 x 3 cm. No similar changes are noted in the mesentery elsewhere.  The appendix appears normal. No evident abscess in the abdomen or pelvis. There is a small ventral hernia containing only fat.  Musculoskeletal: Total hip replacements are noted bilaterally. There is degenerative change in the lumbar spine. There are no blastic or lytic bone lesions. There is no intramuscular lesion.  IMPRESSION: 1. Localized mesenteric thickening in the anterior abdominal region at the level of the superior aspect of the iliac crest. This mesenteric thickening does not appear associated with bowel. 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 No similar changes elsewhere. No associated fluid or adenopathy.  2. Scattered small renal calculi throughout each kidney. No hydronephrosis or ureteral calculus on either side.  3. No changes suggestive of diverticulitis. No evident bowel obstruction or bowel wall thickening. No abscess in the abdomen or pelvis. Appendix appears normal.  4.  Hepatic  steatosis.  5.  Total hip replacements bilaterally.  6.  Small ventral hernia containing only fat   Electronically Signed   By: Bretta Bang III M.D.   On: 03/30/2018 08:02   Assessment:      scelrosing mesenteritis  Plan:     1. No surgical intervention needed at this time.  Recommend symptomatic treatment for now per hospitalist.  I have reached out to GI colleagues to see if any additional treatment such as steroids or immunotherapy maybe needed as well as any long-term f/u.  Ok to continue empiric abx from my standpoint until further recs from GI.  Surgery will sign off but will pass along any recs received from GI.

## 2018-03-30 NOTE — Plan of Care (Signed)
PRN pain meds provided for pain. No falls. Voiding. IV antibiotics provided.  Problem: Education: Goal: Knowledge of General Education information will improve Description Including pain rating scale, medication(s)/side effects and non-pharmacologic comfort measures Outcome: Progressing   Problem: Health Behavior/Discharge Planning: Goal: Ability to manage health-related needs will improve Outcome: Progressing   Problem: Clinical Measurements: Goal: Ability to maintain clinical measurements within normal limits will improve Outcome: Progressing Goal: Will remain free from infection Outcome: Progressing Goal: Diagnostic test results will improve Outcome: Progressing Goal: Respiratory complications will improve Outcome: Progressing Goal: Cardiovascular complication will be avoided Outcome: Progressing   Problem: Activity: Goal: Risk for activity intolerance will decrease Outcome: Progressing   Problem: Nutrition: Goal: Adequate nutrition will be maintained Outcome: Progressing   Problem: Coping: Goal: Level of anxiety will decrease Outcome: Progressing   Problem: Elimination: Goal: Will not experience complications related to bowel motility Outcome: Progressing Goal: Will not experience complications related to urinary retention Outcome: Progressing   Problem: Pain Managment: Goal: General experience of comfort will improve Outcome: Progressing   Problem: Safety: Goal: Ability to remain free from injury will improve Outcome: Progressing   Problem: Skin Integrity: Goal: Risk for impaired skin integrity will decrease Outcome: Progressing

## 2018-03-30 NOTE — H&P (Signed)
[facility] [place] [facility]   PATIENT NAME: Jorge Nichols    MR#:  [id number]  DATE OF BIRTH:  [birth year]  DATE OF ADMISSION:  03/30/2018  PRIMARY CARE PHYSICIAN: Kari BaarsHawkins, Edward, MD   REQUESTING/REFERRING PHYSICIAN: Dr Sharma CovertNorman  CHIEF COMPLAINT:   Chief Complaint  Patient presents with  . Abdominal Pain    HISTORY OF PRESENT ILLNESS:  Jorge Nichols  is a 28 y.o. male with a known history of hypertension, anxiety, prior history of avascular necrosis of the hip and status post hip surgery and nephrolithiasis who presented to the emergency room with complaints of abdominal pains.  Symptoms started this morning.  Located in the left side of the abdomen.  Described as feeling of sharp pain.  No nausea.  No vomiting.  No diarrhea.  Mild aggravation with movements.  No fevers. Patient admitted to having recent symptoms of nausea and vomiting with diarrhea about 1 week ago suggestive of viral gastroenteritis which completely resolved a few days prior to development of abdominal pains.  Patient had been asymptomatic after the recent gastroenteritis before development of abdominal pains today that necessitated presentation to the emergency room.  CT scan of the abdomen and pelvis done revealed findings suggestive of early mesenteric panniculitis or sclerosing mesenteritis.  No similar changes elsewhere.  No evidence of diverticulitis.  Appendix was normal.  Patient already evaluated by surgeon in the emergency room and I discussed plan of care with him as outlined below.  Patient being admitted to medical service for further evaluation.  PAST MEDICAL HISTORY:   Past Medical History:  Diagnosis Date  . Anxiety   . Chronic headaches   . Chronic hip pain   . Hematuria   . History of avascular necrosis of capital femoral epiphysis    bilateral hip s/p  replacement's  . Hypertension   . Mixed dyslipidemia   . Nephrolithiasis    bilateral  non-obstuctive per ct 12-25-2015    . Other hyperparathyroidism (HCC)   . Right ureteral stone   . Vitamin D deficiency   . Weak urinary stream     PAST SURGICAL HISTORY:   Past Surgical History:  Procedure Laterality Date  . CYSTOSCOPY WITH URETEROSCOPY, STONE BASKETRY AND STENT PLACEMENT Right 01/02/2016   Procedure: CYSTOSCOPY WITH RIGHT URETEROSCOPY, STONE EXTRACTION  AND STENT PLACEMENT;  Surgeon: Bjorn PippinJohn Wrenn, MD;  [facility];  Service: Urology;  Laterality: Right;  . EXTRACORPOREAL SHOCK WAVE LITHOTRIPSY  yrs ago  . HIP SURGERY Bilateral 2015  . HOLMIUM LASER APPLICATION Right 01/02/2016   Procedure: HOLMIUM LASER APPLICATION;  Surgeon: Bjorn PippinJohn Wrenn, MD;  [facility];  Service: Urology;  Laterality: Right;  . TOTAL HIP ARTHROPLASTY Bilateral 2015  . URETEROLITHOTOMY  x2  last one 2007    SOCIAL HISTORY:   Social History   Tobacco Use  . Smoking status: Former Smoker    Packs/day: 0.50    Years: 15.00    Pack years: 7.50  . Smokeless tobacco: Current User    Types: Snuff  Substance Use Topics  . Alcohol use: Yes    Comment: occassionally    FAMILY HISTORY:   Family History  Problem Relation Age of Onset  . Hypertension Father     DRUG ALLERGIES:  No Known Allergies  REVIEW OF SYSTEMS:   Review of Systems  Constitutional: Negative for chills, fever and weight loss.  HENT: Negative for hearing loss and tinnitus.   Eyes: Negative for  blurred vision and double vision.  Respiratory: Negative for cough and hemoptysis.   Cardiovascular: Negative for chest pain and palpitations.  Gastrointestinal: Positive for abdominal pain. Negative for heartburn, nausea and vomiting.  Genitourinary: Negative for dysuria and urgency.  Musculoskeletal: Negative for myalgias and neck pain.  Skin: Negative for itching and rash.  Neurological: Negative for dizziness and headaches.  Psychiatric/Behavioral: Negative for depression and hallucinations.    MEDICATIONS  AT HOME:   Prior to Admission medications   Medication Sig Start Date End Date Taking? Authorizing Provider  ALPRAZolam Prudy Feeler) 0.5 MG tablet Take 0.5 mg by mouth 2 (two) times daily as needed for anxiety.  08/01/16  Yes [provider]  metoprolol (LOPRESSOR) 50 MG tablet Take 50 mg by mouth 2 (two) times daily.   Yes [provider]  oxyCODONE-acetaminophen (PERCOCET/ROXICET) 5-325 MG tablet Take 1-2 tablets by mouth every 4 (four) hours as needed for severe pain. 11/12/17   Burgess Amor, PA-C      VITAL SIGNS:  Blood pressure 110/77, pulse 84, temperature 97.7 F (36.5 C), temperature source Oral, resp. rate 18, height 5\' 8"  (1.727 m), weight 113.4 kg, SpO2 99 %.  PHYSICAL EXAMINATION:  Physical Exam  GENERAL:  28 y.o.-year-old patient lying in the bed with no acute distress.  EYES: Pupils equal, round, reactive to light and accommodation. No scleral icterus. Extraocular muscles intact.  HEENT: Head atraumatic, normocephalic. Oropharynx and nasopharynx clear.  NECK:  Supple, no jugular venous distention. No thyroid enlargement, no tenderness.  LUNGS: Normal breath sounds bilaterally, no wheezing, rales,rhonchi or crepitation. No use of accessory muscles of respiration.  CARDIOVASCULAR: S1, S2 normal. No murmurs, rubs, or gallops.  ABDOMEN: Soft, mild left lower quadrant tenderness.  No rebound or guarding.  EXTREMITIES: No pedal edema, cyanosis, or clubbing.  NEUROLOGIC: Cranial nerves II through XII are intact. Muscle strength 5/5 in all extremities. PSYCHIATRIC: The patient is alert and oriented x 3.  SKIN: No obvious rash, lesion, or ulcer.   LABORATORY PANEL:   CBC Recent Labs  Lab 03/30/18 0650  WBC 8.9  HGB 15.4  HCT 46.8  PLT 289   ------------------------------------------------------------------------------------------------------------------  Chemistries  Recent Labs  Lab 03/30/18 0650  NA 141  K 4.0  CL 108  CO2 25  GLUCOSE 110*  BUN 10    CREATININE 0.96  CALCIUM 9.5  AST 31  ALT 40  ALKPHOS 62  BILITOT 0.5   ------------------------------------------------------------------------------------------------------------------  Cardiac Enzymes No results for input(s): TROPONINI in the last 168 hours. ------------------------------------------------------------------------------------------------------------------  RADIOLOGY:  Ct Abdomen Pelvis W Contrast  Result Date: 03/30/2018 CLINICAL DATA:  Generalized abdominal pain EXAM: CT ABDOMEN AND PELVIS WITH CONTRAST TECHNIQUE: Multidetector CT imaging of the abdomen and pelvis was performed using the standard protocol following bolus administration of intravenous contrast. CONTRAST:  ISOVUE-300 IOPAMIDOL (ISOVUE-300) INJECTION 61% COMPARISON:  July 09, 2017. FINDINGS: Lower chest: There is bibasilar atelectasis. No consolidation in the lung bases evident. Hepatobiliary: There is hepatic steatosis. No focal liver lesions are appreciable. Gallbladder wall is not appreciably thickened. There is no biliary duct dilatation. Pancreas: No pancreatic mass or inflammatory focus. Spleen: No splenic lesions are identified. A small accessory spleen is noted anterior to the spleen. Adrenals/Urinary Tract: Adrenals bilaterally appear unremarkable. There is a 7 mm cyst in the lower pole of the left kidney. Kidneys bilaterally show no evident hydronephrosis on either side. There are multiple calculi scattered throughout each kidney ranging from 1 mm to as large as 3 mm. Largest calculus  is noted in the lower pole of the right kidney. There is no appreciable ureteral calculus on either side. Urinary bladder is midline with wall thickness within normal limits. Stomach/Bowel: There is no appreciable bowel wall thickening. There is no appreciable bowel obstruction. No free air or portal venous air. Vascular/Lymphatic: There is no abdominal aortic aneurysm. No vascular lesions are evident. There is no  appreciable adenopathy in the abdomen or pelvis. Reproductive: Prostate and seminal vesicles appear normal in size and contour. No evident pelvic mass. Other: There is localized mesenteric thickening in the anterior aspect of the left abdomen at the level of the superior aspect of the iliac crests. This mesenteric thickening is not associated with bowel. It appears to represent localized mesenteric panniculitis, potentially localized sclerosing mesenteritis. It spans an area of approximately 3 x 3 cm. No similar changes are noted in the mesentery elsewhere. The appendix appears normal. No evident abscess in the abdomen or pelvis. There is a small ventral hernia containing only fat. Musculoskeletal: Total hip replacements are noted bilaterally. There is degenerative change in the lumbar spine. There are no blastic or lytic bone lesions. There is no intramuscular lesion. IMPRESSION: 1. Localized mesenteric thickening in the anterior abdominal region at the level of the superior aspect of the iliac crest. This mesenteric thickening does not appear associated with bowel. 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 No similar changes elsewhere. No associated fluid or adenopathy. 2. Scattered small renal calculi throughout each kidney. No hydronephrosis or ureteral calculus on either side. 3. No changes suggestive of diverticulitis. No evident bowel obstruction or bowel wall thickening. No abscess in the abdomen or pelvis. Appendix appears normal. 4.  Hepatic steatosis. 5.  Total hip replacements bilaterally. 6.  Small ventral hernia containing only fat Electronically Signed   By: Bretta Bang III M.D.   On: 03/30/2018 08:02      IMPRESSION AND PLAN:   Patient is a 28 year old male with a known history of hypertension, anxiety, prior history of avascular necrosis of the hip and status post hip surgery and nephrolithiasis who presented to the emergency room with complaints of abdominal  pains.  Symptoms started this morning.  Diagnosed with mesenteric panniculitis.  Admitted to medical service with surgical consultation.  1.  Mesenteric panniculitis. Patient had symptoms of nausea and vomiting with diarrhea about 1 week ago which resolved completely.  Subsequently developed abdominal pains today and found to have findings suggestive of mesenteric panniculitis on CT scan.  Patient afebrile.  No nausea vomiting or diarrhea. Patient already seen by surgeon Dr. Tonna Boehringer who I discussed plan of care with.  No indication for any surgical intervention at this time.  Recommendation was for symptomatic management for now.  He discussed case with gastroenterology service and no further recommendations from GI. Patient placed empirically on IV antibiotics with IV Zosyn.  IV fluid hydration.  Antiemetics.  PRN Dilaudid for pain control. Monitor clinically.  2.  History of alcohol use Patient drinks at least 2-6 beers almost every day. No evidence of alcohol withdrawal or intoxication at this time.  Placed on p.o. thiamine, multivitamin and folic acid.  Monitor closely  3.  Hypertension With blood pressure controlled on current regimen  4.  Morbid obesity Encouraged lifestyle modifications including weight loss.  DVT prophylaxis; Lovenox Case and plan was discussed extensively with patient as well as wife at bedside, all questions were answered, they voiced understanding and in agreement with the plan of care as outlined above.  CODE STATUS: Full code  TOTAL TIME TAKING CARE OF THIS PATIENT: 45 minutes.    Niki Cosman M.D on 03/30/2018 at 9:41 AM  Between 7am to 6pm - Pager - [phone]  After 6pm go to [email or web address] - Social research [email or web address], [email or web address] [email or web address]  [facility] [place] [facility]  Office  [phone]  CC: Primary care physician; Kari BaarsHawkins, Edward, MD   Note: This dictation was prepared with Dragon dictation along with smaller phrase technology. Any transcriptional errors that  result from this process are unintentional.

## 2018-03-30 NOTE — ED Provider Notes (Signed)
[facility] Emergency Department Provider Note  ____________________________________________  Time seen: Approximately 7:09 AM  I have reviewed the triage vital signs and the nursing notes.   HISTORY  Chief Complaint Abdominal Pain    HPI Jorge Nichols is a 28 y.o. male with a history of HTN, HL, renal colic, presenting with left lower quadrant pain.  The patient reports that he has been feeling well until this morning at 5:30 AM when he woke up with a severe pain in the left lower quadrant.  If he lays on his stomach, his pain is significantly better if he lays on his back it is worse.  He has not had any associated nausea vomiting, fever or chills.  No constipation or diarrhea; his last bowel movement was yesterday and it was normal.  No history of diverticulitis.  No dysuria or hematuria.  Past Medical History:  Diagnosis Date  . Anxiety   . Chronic headaches   . Chronic hip pain   . Hematuria   . History of avascular necrosis of capital femoral epiphysis    bilateral hip s/p  replacement's  . Hypertension   . Mixed dyslipidemia   . Nephrolithiasis    bilateral  non-obstuctive per ct 12-25-2015  . Other hyperparathyroidism (HCC)   . Right ureteral stone   . Vitamin D deficiency   . Weak urinary stream     Patient Active Problem List   Diagnosis Date Noted  . Vitamin D deficiency 06/21/2015  . Other hyperparathyroidism (HCC) 06/05/2015  . BP (high blood pressure) 05/13/2015  . Calculus of kidney 05/13/2015  . Arthralgia of hip 12/04/2013  . Iliopsoas bursitis 12/04/2013  . History of repair of hip joint 07/03/2013  . Avascular necrosis of bone (HCC) 03/20/2013  . Mixed dyslipidemia 09/09/2012  . Metabolic syndrome 09/09/2012  . Morbid obesity (HCC) 09/09/2012  . Sleep disorder breathing 09/09/2012  . Family history of premature CAD 09/09/2012    Past Surgical History:  Procedure Laterality Date  . CYSTOSCOPY WITH URETEROSCOPY, STONE  BASKETRY AND STENT PLACEMENT Right 01/02/2016   Procedure: CYSTOSCOPY WITH RIGHT URETEROSCOPY, STONE EXTRACTION  AND STENT PLACEMENT;  Surgeon: Bjorn PippinJohn Wrenn, MD;  [facility];  Service: Urology;  Laterality: Right;  . EXTRACORPOREAL SHOCK WAVE LITHOTRIPSY  yrs ago  . HIP SURGERY Bilateral 2015  . HOLMIUM LASER APPLICATION Right 01/02/2016   Procedure: HOLMIUM LASER APPLICATION;  Surgeon: Bjorn PippinJohn Wrenn, MD;  [facility];  Service: Urology;  Laterality: Right;  . TOTAL HIP ARTHROPLASTY Bilateral 2015  . URETEROLITHOTOMY  x2  last one 2007    Current Outpatient Rx  . Order #: [id number] Class: Historical Med  . Order #: [id number] Class: Historical Med  . Order #: [id number] Class: Historical Med  . Order #: [id number] Class: Print    Allergies Patient has no known allergies.  Family History  Problem Relation Age of Onset  . Hypertension Father     Social History Social History   Tobacco Use  . Smoking status: Former Smoker    Packs/day: 0.50    Years: 15.00    Pack years: 7.50  . Smokeless tobacco: Current User    Types: Snuff  Substance Use Topics  . Alcohol use: Yes    Comment: occassionally  . Drug use: No    Review of Systems Constitutional: No fever/chills.  No lightheadedness or syncope. Eyes: No visual changes. ENT: No sore throat. No congestion or rhinorrhea. Cardiovascular: Denies chest pain. Denies palpitations. Respiratory: Denies  shortness of breath.  No cough. Gastrointestinal: Positive left lower quadrant abdominal pain.  No flank pain.  No nausea, no vomiting.  No diarrhea.  No constipation. Genitourinary: Negative for dysuria.  No penile, scrotal or testicular pain or swelling.  No penile discharge. Musculoskeletal: Negative for back pain. Skin: Negative for rash. Neurological: Negative for headaches. No focal numbness, tingling or weakness.     ____________________________________________   PHYSICAL  EXAM:  VITAL SIGNS: ED Triage Vitals  Enc Vitals Group     BP 03/30/18 0639 140/81     Pulse Rate 03/30/18 0639 90     Resp 03/30/18 0639 18     Temp 03/30/18 0639 97.7 F (36.5 C)     Temp Source 03/30/18 0639 Oral     SpO2 03/30/18 0639 99 %     Weight 03/30/18 0637 250 lb (113.4 kg)     Height 03/30/18 0637 5\' 8"  (1.727 m)     Head Circumference --      Peak Flow --      Pain Score 03/30/18 0637 8     Pain Loc --      Pain Edu? --      Excl. in GC? --     Constitutional: Alert and oriented.  Answers questions appropriately. Eyes: Conjunctivae are normal.  EOMI. No scleral icterus. Head: Atraumatic. Nose: No congestion/rhinnorhea. Mouth/Throat: Mucous membranes are moist.  Neck: No stridor.  Supple.   Cardiovascular: Normal rate, regular rhythm. No murmurs, rubs or gallops.  Respiratory: Normal respiratory effort.  No accessory muscle use or retractions. Lungs CTAB.  No wheezes, rales or ronchi. Gastrointestinal: Soft, and nondistended.  Tender to palpation in the left lower quadrant below the umbilicus but closer to the midline and then the outer part of the quadrant.  No guarding or rebound.  No peritoneal signs. Musculoskeletal: No LE edema.  Neurologic:  A&Ox3.  Speech is clear.  Face and smile are symmetric.  EOMI.  Moves all extremities well. Skin:  Skin is warm, dry and intact. No rash noted. Psychiatric: Mood and affect are normal. Speech and behavior are normal.  Normal judgement.  ____________________________________________   LABS (all labs ordered are listed, but only abnormal results are displayed)  Labs Reviewed  COMPREHENSIVE METABOLIC PANEL - Abnormal; Notable for the following components:      Result Value   Glucose, Bld 110 (*)    All other components within normal limits  CBC WITH DIFFERENTIAL/PLATELET - Abnormal; Notable for the following components:   MCV 100.2 (*)    Monocytes Absolute 1.1 (*)    All other components within normal limits   LIPASE, BLOOD  URINALYSIS, COMPLETE (UACMP) WITH MICROSCOPIC   ____________________________________________  EKG  Not indicated ____________________________________________  RADIOLOGY  Ct Abdomen Pelvis W Contrast  Result Date: 03/30/2018 CLINICAL DATA:  Generalized abdominal pain EXAM: CT ABDOMEN AND PELVIS WITH CONTRAST TECHNIQUE: Multidetector CT imaging of the abdomen and pelvis was performed using the standard protocol following bolus administration of intravenous contrast. CONTRAST:  ISOVUE-300 IOPAMIDOL (ISOVUE-300) INJECTION 61% COMPARISON:  July 09, 2017. FINDINGS: Lower chest: There is bibasilar atelectasis. No consolidation in the lung bases evident. Hepatobiliary: There is hepatic steatosis. No focal liver lesions are appreciable. Gallbladder wall is not appreciably thickened. There is no biliary duct dilatation. Pancreas: No pancreatic mass or inflammatory focus. Spleen: No splenic lesions are identified. A small accessory spleen is noted anterior to the spleen. Adrenals/Urinary Tract: Adrenals bilaterally appear unremarkable. There is a 7 mm cyst  in the lower pole of the left kidney. Kidneys bilaterally show no evident hydronephrosis on either side. There are multiple calculi scattered throughout each kidney ranging from 1 mm to as large as 3 mm. Largest calculus is noted in the lower pole of the right kidney. There is no appreciable ureteral calculus on either side. Urinary bladder is midline with wall thickness within normal limits. Stomach/Bowel: There is no appreciable bowel wall thickening. There is no appreciable bowel obstruction. No free air or portal venous air. Vascular/Lymphatic: There is no abdominal aortic aneurysm. No vascular lesions are evident. There is no appreciable adenopathy in the abdomen or pelvis. Reproductive: Prostate and seminal vesicles appear normal in size and contour. No evident pelvic mass. Other: There is localized mesenteric thickening in the  anterior aspect of the left abdomen at the level of the superior aspect of the iliac crests. This mesenteric thickening is not associated with bowel. It appears to represent localized mesenteric panniculitis, potentially localized sclerosing mesenteritis. It spans an area of approximately 3 x 3 cm. No similar changes are noted in the mesentery elsewhere. The appendix appears normal. No evident abscess in the abdomen or pelvis. There is a small ventral hernia containing only fat. Musculoskeletal: Total hip replacements are noted bilaterally. There is degenerative change in the lumbar spine. There are no blastic or lytic bone lesions. There is no intramuscular lesion. IMPRESSION: 1. Localized mesenteric thickening in the anterior abdominal region at the level of the superior aspect of the iliac crest. This mesenteric thickening does not appear associated with bowel. 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 No similar changes elsewhere. No associated fluid or adenopathy. 2. Scattered small renal calculi throughout each kidney. No hydronephrosis or ureteral calculus on either side. 3. No changes suggestive of diverticulitis. No evident bowel obstruction or bowel wall thickening. No abscess in the abdomen or pelvis. Appendix appears normal. 4.  Hepatic steatosis. 5.  Total hip replacements bilaterally. 6.  Small ventral hernia containing only fat Electronically Signed   By: Bretta Bang III M.D.   On: 03/30/2018 08:02    ____________________________________________   PROCEDURES  Procedure(s) performed: None  Procedures  Critical Care performed: No ____________________________________________   INITIAL IMPRESSION / ASSESSMENT AND PLAN / ED COURSE  Pertinent labs & imaging results that were available during my care of the patient were reviewed by me and considered in my medical decision making (see chart for details).  28 y.o. male without any prior intra-abdominal surgery  presenting with left lower quadrant pain since 5:30 AM.  Overall, the patient is hemodynamically stable and afebrile.  I am concerned about diverticulitis; renal colic is also on my differential.  Constipation, gas, are also possible.  A CT scan has been ordered.  The patient will receive symptomatic treatment and intravenous fluids.  Plan reevaluation for final disposition.  ----------------------------------------- 8:22 AM on 03/30/2018 -----------------------------------------  The patient CT scan does not show any evidence of bowel involvement or active renal colic although he does have some nephrolithiasis in the renal pelvis ease bilaterally.  The CT scan does show some stranding consistent with mesenteric panniculitis versus sclerosing mesenteritis.  I have ordered additional pain control for the patient has his pain has come back, as well as Zosyn to treat his symptoms.  I spoke with Dr. Tonna Boehringer, the general surgeon on-call who will evaluate the patient; the treatment for this is serial abdominal exams; there is no surgical intervention required at this time.  The patient will be admitted to  the hospitalist for ongoing pain control as well as antibiotics and continued monitoring. ____________________________________________  FINAL CLINICAL IMPRESSION(S) / ED DIAGNOSES  Final diagnoses:  Abdominal infection (HCC)         NEW MEDICATIONS STARTED DURING THIS VISIT:  New Prescriptions   No medications on file      Rockne Menghini, MD 03/30/18 [phone]

## 2018-03-31 DIAGNOSIS — I10 Essential (primary) hypertension: Secondary | ICD-10-CM | POA: Diagnosis present

## 2018-03-31 DIAGNOSIS — K654 Sclerosing mesenteritis: Secondary | ICD-10-CM | POA: Diagnosis present

## 2018-03-31 DIAGNOSIS — F1010 Alcohol abuse, uncomplicated: Secondary | ICD-10-CM | POA: Diagnosis present

## 2018-03-31 DIAGNOSIS — K429 Umbilical hernia without obstruction or gangrene: Secondary | ICD-10-CM | POA: Diagnosis present

## 2018-03-31 DIAGNOSIS — F419 Anxiety disorder, unspecified: Secondary | ICD-10-CM | POA: Diagnosis present

## 2018-03-31 DIAGNOSIS — Z96643 Presence of artificial hip joint, bilateral: Secondary | ICD-10-CM | POA: Diagnosis present

## 2018-03-31 DIAGNOSIS — Z79899 Other long term (current) drug therapy: Secondary | ICD-10-CM | POA: Diagnosis not present

## 2018-03-31 DIAGNOSIS — Z6838 Body mass index (BMI) 38.0-38.9, adult: Secondary | ICD-10-CM | POA: Diagnosis not present

## 2018-03-31 DIAGNOSIS — E782 Mixed hyperlipidemia: Secondary | ICD-10-CM | POA: Diagnosis present

## 2018-03-31 DIAGNOSIS — K659 Peritonitis, unspecified: Secondary | ICD-10-CM | POA: Diagnosis present

## 2018-03-31 DIAGNOSIS — Z720 Tobacco use: Secondary | ICD-10-CM | POA: Diagnosis not present

## 2018-03-31 LAB — BASIC METABOLIC PANEL
Anion gap: 7 (ref 5–15)
BUN: 9 mg/dL (ref 6–20)
CO2: 27 mmol/L (ref 22–32)
Calcium: 8.4 mg/dL — ABNORMAL LOW (ref 8.9–10.3)
Chloride: 105 mmol/L (ref 98–111)
Creatinine, Ser: 1.08 mg/dL (ref 0.61–1.24)
GFR calc Af Amer: >60 mL/min (ref >60)
GFR calc non Af Amer: >60 mL/min (ref >60)
Glucose, Bld: 98 mg/dL (ref 70–99)
Potassium: 3.6 mmol/L (ref 3.5–5.1)
SODIUM: 139 mmol/L (ref 135–145)

## 2018-03-31 LAB — CBC
HCT: 39.9 % (ref 39.0–52.0)
Hemoglobin: 13.2 g/dL (ref 13.0–17.0)
MCH: 33.8 pg (ref 26.0–34.0)
MCHC: 33.1 g/dL (ref 30.0–36.0)
MCV: 102.3 fL — ABNORMAL HIGH (ref 80.0–100.0)
Platelets: 230 10*3/uL (ref 150–400)
RBC: 3.9 MIL/uL — ABNORMAL LOW (ref 4.22–5.81)
RDW: 12.6 % (ref 11.5–15.5)
WBC: 7.1 10*3/uL (ref 4.0–10.5)
nRBC: 0 % (ref 0.0–0.2)

## 2018-03-31 LAB — MAGNESIUM: Magnesium: 1.8 mg/dL (ref 1.7–2.4)

## 2018-03-31 LAB — HIV ANTIBODY (ROUTINE TESTING W REFLEX): HIV Screen 4th Generation wRfx: NONREACTIVE

## 2018-03-31 MED ORDER — IBUPROFEN 400 MG PO TABS
400.0000 mg | ORAL_TABLET | Freq: Four times a day (QID) | ORAL | Status: DC | PRN
  Administered 2018-03-31 – 2018-04-01 (×2): 400 mg via ORAL
  Filled 2018-03-31 (×2): qty 1

## 2018-03-31 MED ORDER — HYDROMORPHONE HCL 1 MG/ML IJ SOLN
1.0000 mg | INTRAMUSCULAR | Status: DC | PRN
  Administered 2018-03-31 – 2018-04-01 (×7): 1 mg via INTRAVENOUS
  Filled 2018-03-31 (×7): qty 1

## 2018-03-31 MED ORDER — SODIUM CHLORIDE 0.9 % IV SOLN
INTRAVENOUS | Status: DC | PRN
  Administered 2018-03-31: 250 mL via INTRAVENOUS

## 2018-03-31 NOTE — Progress Notes (Signed)
[employer] [facility] [place] [facility]   PATIENT NAME: Jorge Nichols    MR#:  [id number]  DATE OF BIRTH:  Sep 27, 1990  SUBJECTIVE:  CHIEF COMPLAINT:   Chief Complaint  Patient presents with  . Abdominal Pain   This morning patient was still complaining of abdominal pains.  Increased the dose of Dilaudid from 0.5 to 1 mg every 4 as needed.  No fevers.  No nausea or vomiting.  REVIEW OF SYSTEMS:  Review of Systems  Constitutional: Negative for chills, fever and weight loss.  HENT: Negative for ear pain and hearing loss.   Eyes: Negative for blurred vision and double vision.  Respiratory: Negative for cough, hemoptysis and shortness of breath.   Cardiovascular: Negative for palpitations.  Gastrointestinal: Positive for abdominal pain. Negative for nausea and vomiting.  Genitourinary: Negative for dysuria and urgency.  Musculoskeletal: Negative for myalgias and neck pain.  Skin: Negative for rash.  Neurological: Negative for dizziness and headaches.  Psychiatric/Behavioral: Negative for depression and hallucinations.    DRUG ALLERGIES:  No Known Allergies VITALS:  Blood pressure 124/86, pulse (!) 55, temperature (!) 97.5 F (36.4 C), temperature source Oral, resp. rate 16, height 5\' 8"  (1.727 m), weight 113.4 kg, SpO2 96 %. PHYSICAL EXAMINATION:   Physical Exam  Constitutional: He is oriented to person, place, and time. He appears well-developed and well-nourished.  HENT:  Head: Normocephalic and atraumatic.  Eyes: Pupils are equal, round, and reactive to light. Conjunctivae and EOM are normal.  Neck: Normal range of motion. Neck supple.  Cardiovascular: Normal rate, regular rhythm and normal heart sounds.  Respiratory: Effort normal and breath sounds normal. No respiratory distress.  GI: Soft. Bowel sounds are normal.  Left lower quadrant tenderness.  No rebound or guarding  Musculoskeletal: Normal range of motion.        General: No tenderness or edema.    Neurological: He is alert and oriented to person, place, and time.  Skin: Skin is warm.  Psychiatric: He has a normal mood and affect. His behavior is normal.   LABORATORY PANEL:  Male CBC Recent Labs  Lab 03/31/18 0554  WBC 7.1  HGB 13.2  HCT 39.9  PLT 230   ------------------------------------------------------------------------------------------------------------------ Chemistries  Recent Labs  Lab 03/30/18 0650 03/31/18 0554  NA 141 139  K 4.0 3.6  CL 108 105  CO2 25 27  GLUCOSE 110* 98  BUN 10 9  CREATININE 0.96 1.08  CALCIUM 9.5 8.4*  MG  --  1.8  AST 31  --   ALT 40  --   ALKPHOS 62  --   BILITOT 0.5  --    RADIOLOGY:  No results found. ASSESSMENT AND PLAN:   Patient is a 28 year old male with a known history of hypertension, anxiety, prior history of avascular necrosis of the hip and status post hip surgery and nephrolithiasis who presented to the emergency room with complaints of abdominal pains.  Symptoms started this morning.  Diagnosed with mesenteric panniculitis.  Admitted to medical service.  Patient seen by surgeon in consultation.  1.  Mesenteric panniculitis or sclerosing mesenteritis Patient had symptoms of nausea and vomiting with diarrhea about 1 week ago which resolved completely.  Subsequently developed abdominal pains today and found to have findings suggestive of mesenteric panniculitis on CT scan.  Patient afebrile.  No nausea vomiting or diarrhea. Patient seen by surgeon Dr. Tonna BoehringerSakai who I discussed plan of care with.  No indication for any surgical intervention at this time.  Recommendation was for symptomatic management for now.  He discussed case with gastroenterology service and no further recommendations from GI. Patient was initially started on empiric antibiotics with IV Zosyn on admission yesterday.  Placed on IV fluid hydration.  Antiemetics.  PRN Dilaudid for pain control. This morning patient still complaining of significant abdominal  pains.  Increased dose of as needed Dilaudid from 0.5 to 1 mg I called and discussed case with gastroenterologist on call at [facility] consult line ([phone]) for Dr. Dyann Kief today.  He confirmed there was no indication for transfer.  Also confirmed that this is a very recommendation and usually self-limiting.  Management is usually conservative management.  He recommended continuing IV fluids and antiemetics as outlined above.  Recommended adding NSAID with as needed Motrin.  No indication for antibiotics which was discontinued today.  Bland diet. If symptoms not improving by tomorrow, the gastroenterologist at [facility] consult Recommended initiating prednisone 40 mg p.o. daily over the next 1 month and then subsequently gradually taper and to have patient follow-up with the local gastroenterology service on discharge.  I discussed plan of care as detailed above with patient as well as patient's wife at bedside and he agreed with the plan of care and recommendations as outlined.  2.  History of alcohol use Patient drinks at least 2-6 beers almost every day. No evidence of alcohol withdrawal or intoxication at this time.  Placed on p.o. thiamine, multivitamin and folic acid.  Monitor closely  3.  Hypertension Blood pressure controlled on current regimen  4.  Morbid obesity Encouraged lifestyle modifications including weight loss.  DVT prophylaxis; Lovenox    All the records are reviewed and case discussed with Care Management/Social Worker. Management plans discussed with the patient, family and they are in agreement.  CODE STATUS: Full Code  TOTAL TIME TAKING CARE OF THIS PATIENT: 39 minutes.   More than 50% of the time was spent in counseling/coordination of care: YES  POSSIBLE D/C IN 1-2 DAYS, DEPENDING ON CLINICAL CONDITION.   Sandara Tyree M.D on 03/31/2018 at 2:40 PM  Between 7am to 6pm - Pager - [phone]  After 6pm go to [email or web address] - Social research officer, government  [facility]  Office  [phone]  CC: Primary care physician; Kari Baars, MD  Note: This dictation was prepared with Dragon dictation along with smaller phrase technology. Any transcriptional errors that result from this process are unintentional.

## 2018-04-01 LAB — BASIC METABOLIC PANEL
Anion gap: 6 (ref 5–15)
BUN: 8 mg/dL (ref 6–20)
CO2: 23 mmol/L (ref 22–32)
Calcium: 8.4 mg/dL — ABNORMAL LOW (ref 8.9–10.3)
Chloride: 110 mmol/L (ref 98–111)
Creatinine, Ser: 0.82 mg/dL (ref 0.61–1.24)
GFR calc Af Amer: >60 mL/min (ref >60)
GFR calc non Af Amer: >60 mL/min (ref >60)
GLUCOSE: 96 mg/dL (ref 70–99)
Potassium: 3.9 mmol/L (ref 3.5–5.1)
Sodium: 139 mmol/L (ref 135–145)

## 2018-04-01 LAB — CBC
HEMATOCRIT: 38.8 % — AB (ref 39.0–52.0)
Hemoglobin: 12.7 g/dL — ABNORMAL LOW (ref 13.0–17.0)
MCH: 33.3 pg (ref 26.0–34.0)
MCHC: 32.7 g/dL (ref 30.0–36.0)
MCV: 101.8 fL — ABNORMAL HIGH (ref 80.0–100.0)
Platelets: 233 10*3/uL (ref 150–400)
RBC: 3.81 MIL/uL — ABNORMAL LOW (ref 4.22–5.81)
RDW: 12.2 % (ref 11.5–15.5)
WBC: 6.6 10*3/uL (ref 4.0–10.5)
nRBC: 0 % (ref 0.0–0.2)

## 2018-04-01 LAB — MAGNESIUM: Magnesium: 2 mg/dL (ref 1.7–2.4)

## 2018-04-01 MED ORDER — IBUPROFEN 400 MG PO TABS: 400.0000 mg | ORAL_TABLET | Freq: Four times a day (QID) | ORAL | 0 refills | Status: DC | PRN

## 2018-04-01 NOTE — Discharge Summary (Signed)
[employer] [facility] - [place] at [facility]   PATIENT NAME: Jorge Nichols    MR#:  [id number]  DATE OF BIRTH:  [birth year]  DATE OF ADMISSION:  03/30/2018   ADMITTING PHYSICIAN: Campbell Stall, MD  DATE OF DISCHARGE: 04/01/2018  PRIMARY CARE PHYSICIAN: Kari Baars, MD   ADMISSION DIAGNOSIS:  Abdominal infection (HCC) [K65.9] DISCHARGE DIAGNOSIS:  Active Problems:   Mesenteric panniculitis (HCC)  SECONDARY DIAGNOSIS:   Past Medical History:  Diagnosis Date  . Anxiety   . Chronic headaches   . Chronic hip pain   . Hematuria   . History of avascular necrosis of capital femoral epiphysis    bilateral hip s/p  replacement's  . Hypertension   . Mixed dyslipidemia   . Nephrolithiasis    bilateral  non-obstuctive per ct 12-25-2015  . Other hyperparathyroidism (HCC)   . Right ureteral stone   . Vitamin D deficiency   . Weak urinary stream    HOSPITAL COURSE:  Patient is a 28 year old malewith a known history ofhypertension,anxiety,prior history of avascular necrosis of the hip and status post hip surgery and nephrolithiasis who presented to the emergency room with complaints of abdominal pains. Symptoms started this morning. Diagnosed with mesenteric panniculitis.  1.Mesenteric panniculitis or sclerosing mesenteritis Patient had symptoms of nausea and vomiting with diarrhea about 1 week ago which resolved completely. Subsequently developed abdominal pains today and found to have findings suggestive of mesenteric panniculitis on CT scan. Patient afebrile. No nausea vomiting or diarrhea. Patient seen by surgeon Dr. Tonna Boehringer who I discussed plan of care with. No indication for any surgical intervention at this time. Recommendation was for symptomatic management for now. He discussed case with gastroenterology service and no further recommendations from GI. Patient was initially started on empiric antibiotics with IV Zosyn on admission yesterday.  Placed on IV  fluid hydration.  Antiemetics.  PRN Dilaudid for pain control. Since the patient still complaining of significant abdominal pains.  Increased dose of as needed Dilaudid from 0.5 to 1 mg Dr. Enid Baas called and discussed case with gastroenterologist on call at [facility] consult line ([phone]) for Dr. Dyann Kief.  He confirmed there was no indication for transfer.  Also confirmed that this is a very recommendation and usually self-limiting.  Management is usually conservative management.  He recommended continuing IV fluids and antiemetics as outlined above.  Recommended adding NSAID with as needed Motrin.  No indication for antibiotics which was discontinued.  Bland diet. If symptoms not improving, the gastroenterologist at [facility] consult Recommended initiating prednisone 40 mg p.o. daily over the next 1 month and then subsequently gradually taper and to have patient follow-up with the local gastroenterology service on discharge.  The patient abdominal pain is much better.  I discussed with the patient about [facility] consult GI physicians recommendation.  Since the patient's symptoms has much improved.  No need to start steroids at this time.  He needs to follow-up PCP as outpatient.  2.History of alcohol use Patient drinks at least 2-6 beers almost every day. No evidence of alcohol withdrawal or intoxication at this time. Placed on p.o. thiamine,multivitamin and folic acid. He was advised alcohol cessation.  3.Hypertension Blood pressure controlled on current regimen  4.Morbid obesity Encouraged lifestyle modifications including weight loss. DISCHARGE CONDITIONS:  Stable, discharge to home today. CONSULTS OBTAINED:  Treatment Team:  Sung Amabile, DO DRUG ALLERGIES:  No Known Allergies DISCHARGE MEDICATIONS:   Allergies as of 04/01/2018   No Known Allergies  Medication List    TAKE these medications   ALPRAZolam 0.5 MG tablet Commonly known as:  XANAX Take 0.5 mg by mouth 2 (two)  times daily as needed for anxiety.   ibuprofen 400 MG tablet Commonly known as:  ADVIL,MOTRIN Take 1 tablet (400 mg total) by mouth every 6 (six) hours as needed for mild pain or moderate pain.   metoprolol tartrate 50 MG tablet Commonly known as:  LOPRESSOR Take 50 mg by mouth 2 (two) times daily.   oxyCODONE-acetaminophen 5-325 MG tablet Commonly known as:  PERCOCET/ROXICET Take 1-2 tablets by mouth every 4 (four) hours as needed for severe pain.        DISCHARGE INSTRUCTIONS:  See AVS.  If you experience worsening of your admission symptoms, develop shortness of breath, life threatening emergency, suicidal or homicidal thoughts you must seek medical attention immediately by calling 911 or calling your MD immediately  if symptoms less severe.  You Must read complete instructions/literature along with all the possible adverse reactions/side effects for all the Medicines you take and that have been prescribed to you. Take any new Medicines after you have completely understood and accpet all the possible adverse reactions/side effects.   Please note  You were cared for by a hospitalist during your hospital stay. If you have any questions about your discharge medications or the care you received while you were in the hospital after you are discharged, you can call the unit and asked to speak with the hospitalist on call if the hospitalist that took care of you is not available. Once you are discharged, your primary care physician will handle any further medical issues. Please note that NO REFILLS for any discharge medications will be authorized once you are discharged, as it is imperative that you return to your primary care physician (or establish a relationship with a primary care physician if you do not have one) for your aftercare needs so that they can reassess your need for medications and monitor your lab values.    On the day of Discharge:  VITAL SIGNS:  Blood pressure 119/81,  pulse 62, temperature 98.4 F (36.9 C), temperature source Oral, resp. rate 18, height 5\' 8"  (1.727 m), weight 113.4 kg, SpO2 97 %. PHYSICAL EXAMINATION:  GENERAL:  28 y.o.-year-old patient lying in the bed with no acute distress.  Morbid obesity. EYES: Pupils equal, round, reactive to light and accommodation. No scleral icterus. Extraocular muscles intact.  HEENT: Head atraumatic, normocephalic. Oropharynx and nasopharynx clear.  NECK:  Supple, no jugular venous distention. No thyroid enlargement, no tenderness.  LUNGS: Normal breath sounds bilaterally, no wheezing, rales,rhonchi or crepitation. No use of accessory muscles of respiration.  CARDIOVASCULAR: S1, S2 normal. No murmurs, rubs, or gallops.  ABDOMEN: Soft, non-tender, non-distended. Bowel sounds present. No organomegaly or mass.  EXTREMITIES: No pedal edema, cyanosis, or clubbing.  NEUROLOGIC: Cranial nerves II through XII are intact. Muscle strength 5/5 in all extremities. Sensation intact. Gait not checked.  PSYCHIATRIC: The patient is alert and oriented x 3.  SKIN: No obvious rash, lesion, or ulcer.  DATA REVIEW:   CBC Recent Labs  Lab 04/01/18 0452  WBC 6.6  HGB 12.7*  HCT 38.8*  PLT 233    Chemistries  Recent Labs  Lab 03/30/18 0650  04/01/18 0452  NA 141   < > 139  K 4.0   < > 3.9  CL 108   < > 110  CO2 25   < > 23  GLUCOSE 110*   < >  96  BUN 10   < > 8  CREATININE 0.96   < > 0.82  CALCIUM 9.5   < > 8.4*  MG  --    < > 2.0  AST 31  --   --   ALT 40  --   --   ALKPHOS 62  --   --   BILITOT 0.5  --   --    < > = values in this interval not displayed.     Microbiology Results  Results for orders placed or performed during the hospital encounter of 11/11/17  Urine Culture     Status: None   Collection Time: 11/11/17  9:57 PM  Result Value Ref Range Status   Specimen Description   Final    URINE, CLEAN CATCH Performed at [facility], [address], [place], [place] [address]    Special Requests    Final    Normal Performed at [facility], [address], [place], [place] [address]    Culture   Final    NO GROWTH Performed at [facility], [address], [place], [place] [address]    Report Status 11/13/2017 FINAL  Final    RADIOLOGY:  No results found.   Management plans discussed with the patient, family and they are in agreement.  CODE STATUS: Full Code   TOTAL TIME TAKING CARE OF THIS PATIENT: 32 minutes.    Shaune Pollack M.D on 04/01/2018 at 2:42 PM  Between 7am to 6pm - Pager - [phone]  After 6pm go to [email or web address] - Social research officer, government  [facility]  Office  [phone]  CC: Primary care physician; Kari Baars, MD   Note: This dictation was prepared with Dragon dictation along with smaller phrase technology. Any transcriptional errors that result from this process are unintentional.

## 2018-07-19 DIAGNOSIS — M545 Low back pain: Secondary | ICD-10-CM | POA: Diagnosis not present

## 2018-07-19 DIAGNOSIS — F419 Anxiety disorder, unspecified: Secondary | ICD-10-CM | POA: Diagnosis not present

## 2018-07-19 DIAGNOSIS — E669 Obesity, unspecified: Secondary | ICD-10-CM | POA: Diagnosis not present

## 2018-07-26 DIAGNOSIS — F419 Anxiety disorder, unspecified: Secondary | ICD-10-CM | POA: Diagnosis not present

## 2018-07-26 DIAGNOSIS — M545 Low back pain: Secondary | ICD-10-CM | POA: Diagnosis not present

## 2018-08-04 ENCOUNTER — Other Ambulatory Visit: Payer: Self-pay

## 2018-08-04 ENCOUNTER — Emergency Department (HOSPITAL_COMMUNITY)
Admission: EM | Admit: 2018-08-04 | Discharge: 2018-08-05 | Disposition: A | Discharge status: Home / Self Care | Payer: Medicare Other | Attending: Emergency Medicine | Admitting: Emergency Medicine

## 2018-08-04 ENCOUNTER — Encounter (HOSPITAL_COMMUNITY): Payer: Self-pay | Admitting: Emergency Medicine

## 2018-08-04 DIAGNOSIS — I10 Essential (primary) hypertension: Secondary | ICD-10-CM | POA: Insufficient documentation

## 2018-08-04 DIAGNOSIS — F17220 Nicotine dependence, chewing tobacco, uncomplicated: Secondary | ICD-10-CM | POA: Insufficient documentation

## 2018-08-04 DIAGNOSIS — N200 Calculus of kidney: Secondary | ICD-10-CM

## 2018-08-04 DIAGNOSIS — Z79899 Other long term (current) drug therapy: Secondary | ICD-10-CM | POA: Diagnosis not present

## 2018-08-04 DIAGNOSIS — R109 Unspecified abdominal pain: Secondary | ICD-10-CM | POA: Diagnosis present

## 2018-08-04 MED ORDER — HYDROMORPHONE HCL 1 MG/ML IJ SOLN
1.0000 mg | Freq: Once | INTRAMUSCULAR | Status: AC
  Administered 2018-08-04: 1 mg via INTRAVENOUS
  Filled 2018-08-04: qty 1

## 2018-08-04 MED ORDER — LACTATED RINGERS IV BOLUS
1000.0000 mL | Freq: Once | INTRAVENOUS | Status: AC
  Administered 2018-08-04: 1000 mL via INTRAVENOUS

## 2018-08-04 MED ORDER — KETOROLAC TROMETHAMINE 30 MG/ML IJ SOLN
30.0000 mg | Freq: Once | INTRAMUSCULAR | Status: AC
  Administered 2018-08-04: 30 mg via INTRAVENOUS
  Filled 2018-08-04: qty 1

## 2018-08-04 NOTE — ED Triage Notes (Signed)
RT groin/ flank pain for couple of days.  Hx of kidney stones

## 2018-08-05 LAB — COMPREHENSIVE METABOLIC PANEL
ALT: 51 U/L — ABNORMAL HIGH (ref 0–44)
AST: 49 U/L — ABNORMAL HIGH (ref 15–41)
Albumin: 3.6 g/dL (ref 3.5–5.0)
Alkaline Phosphatase: 62 U/L (ref 38–126)
Anion gap: 9 (ref 5–15)
BUN: 7 mg/dL (ref 6–20)
CO2: 27 mmol/L (ref 22–32)
Calcium: 9 mg/dL (ref 8.9–10.3)
Chloride: 103 mmol/L (ref 98–111)
Creatinine, Ser: 0.88 mg/dL (ref 0.61–1.24)
GFR calc Af Amer: >60 mL/min (ref >60)
GFR calc non Af Amer: >60 mL/min (ref >60)
Glucose, Bld: 95 mg/dL (ref 70–99)
Potassium: 3.6 mmol/L (ref 3.5–5.1)
Sodium: 139 mmol/L (ref 135–145)
Total Bilirubin: 0.4 mg/dL (ref 0.3–1.2)
Total Protein: 6.7 g/dL (ref 6.5–8.1)

## 2018-08-05 LAB — CBC WITH DIFFERENTIAL/PLATELET
Abs Immature Granulocytes: 0.01 10*3/uL (ref 0.00–0.07)
Basophils Absolute: 0 10*3/uL (ref 0.0–0.1)
Basophils Relative: 1 %
Eosinophils Absolute: 0.4 10*3/uL (ref 0.0–0.5)
Eosinophils Relative: 7 %
HCT: 41.5 % (ref 39.0–52.0)
Hemoglobin: 14.3 g/dL (ref 13.0–17.0)
Immature Granulocytes: 0 %
Lymphocytes Relative: 35 %
Lymphs Abs: 2 10*3/uL (ref 0.7–4.0)
MCH: 33.5 pg (ref 26.0–34.0)
MCHC: 34.5 g/dL (ref 30.0–36.0)
MCV: 97.2 fL (ref 80.0–100.0)
Monocytes Absolute: 0.5 10*3/uL (ref 0.1–1.0)
Monocytes Relative: 9 %
Neutro Abs: 2.9 10*3/uL (ref 1.7–7.7)
Neutrophils Relative %: 48 %
Platelets: 260 10*3/uL (ref 150–400)
RBC: 4.27 MIL/uL (ref 4.22–5.81)
RDW: 11.9 % (ref 11.5–15.5)
WBC: 5.9 10*3/uL (ref 4.0–10.5)
nRBC: 0 % (ref 0.0–0.2)

## 2018-08-05 LAB — URINALYSIS, ROUTINE W REFLEX MICROSCOPIC
Bacteria, UA: NONE SEEN
Bilirubin Urine: NEGATIVE
Glucose, UA: NEGATIVE mg/dL
Ketones, ur: NEGATIVE mg/dL
Leukocytes,Ua: NEGATIVE
Nitrite: NEGATIVE
Protein, ur: NEGATIVE mg/dL
Specific Gravity, Urine: 1.01 (ref 1.005–1.030)
pH: 6 (ref 5.0–8.0)

## 2018-08-05 MED ORDER — HYDROMORPHONE HCL 1 MG/ML IJ SOLN
1.0000 mg | Freq: Once | INTRAMUSCULAR | Status: AC
  Administered 2018-08-05: 01:00:00 1 mg via INTRAVENOUS
  Filled 2018-08-05: qty 1

## 2018-08-05 MED ORDER — METOCLOPRAMIDE HCL 5 MG/ML IJ SOLN
10.0000 mg | Freq: Once | INTRAMUSCULAR | Status: AC
  Administered 2018-08-05: 10 mg via INTRAVENOUS
  Filled 2018-08-05: qty 2

## 2018-08-05 MED ORDER — OXYCODONE-ACETAMINOPHEN 5-325 MG PO TABS
2.0000 | ORAL_TABLET | Freq: Once | ORAL | Status: AC
  Administered 2018-08-05: 02:00:00 2 via ORAL
  Filled 2018-08-05: qty 2

## 2018-08-05 MED ORDER — OXYCODONE-ACETAMINOPHEN 5-325 MG PO TABS: 2.0000 | ORAL_TABLET | ORAL | 0 refills | Status: DC | PRN

## 2018-08-05 MED ORDER — METOCLOPRAMIDE HCL 10 MG PO TABS: 10.0000 mg | ORAL_TABLET | Freq: Four times a day (QID) | ORAL | 0 refills | Status: DC | PRN

## 2018-08-05 MED ORDER — TAMSULOSIN HCL 0.4 MG PO CAPS
0.4000 mg | ORAL_CAPSULE | Freq: Once | ORAL | Status: AC
  Administered 2018-08-05: 01:00:00 0.4 mg via ORAL
  Filled 2018-08-05: qty 1

## 2018-08-05 NOTE — ED Provider Notes (Signed)
[facility] EMERGENCY DEPARTMENT Provider Note   CSN: [id number] Arrival date & time: 08/04/18  2251    History   Chief Complaint Chief Complaint  Patient presents with  . Flank Pain    HPI Jorge Nichols is a 28 y.o. male.      Flank Pain  This is a new problem. The current episode started 2 days ago. The problem occurs constantly. The problem has not changed since onset.Nothing aggravates the symptoms. Nothing relieves the symptoms. He has tried acetaminophen for the symptoms.    Past Medical History:  Diagnosis Date  . Anxiety   . Chronic headaches   . Chronic hip pain   . Hematuria   . History of avascular necrosis of capital femoral epiphysis    bilateral hip s/p  replacement's  . Hypertension   . Mixed dyslipidemia   . Nephrolithiasis    bilateral  non-obstuctive per ct 12-25-2015  . Other hyperparathyroidism (HCC)   . Right ureteral stone   . Vitamin D deficiency   . Weak urinary stream     Patient Active Problem List   Diagnosis Date Noted  . Mesenteric panniculitis (HCC) 03/30/2018  . Vitamin D deficiency 06/21/2015  . Other hyperparathyroidism (HCC) 06/05/2015  . BP (high blood pressure) 05/13/2015  . Calculus of kidney 05/13/2015  . Arthralgia of hip 12/04/2013  . Iliopsoas bursitis 12/04/2013  . History of repair of hip joint 07/03/2013  . Avascular necrosis of bone (HCC) 03/20/2013  . Mixed dyslipidemia 09/09/2012  . Metabolic syndrome 09/09/2012  . Morbid obesity (HCC) 09/09/2012  . Sleep disorder breathing 09/09/2012  . Family history of premature CAD 09/09/2012    Past Surgical History:  Procedure Laterality Date  . CYSTOSCOPY WITH URETEROSCOPY, STONE BASKETRY AND STENT PLACEMENT Right 01/02/2016   Procedure: CYSTOSCOPY WITH RIGHT URETEROSCOPY, STONE EXTRACTION  AND STENT PLACEMENT;  Surgeon: Bjorn Pippin, MD;  Location: [facility];  Service: Urology;  Laterality: Right;  . EXTRACORPOREAL SHOCK WAVE LITHOTRIPSY  yrs ago   . HIP SURGERY Bilateral 2015  . HOLMIUM LASER APPLICATION Right 01/02/2016   Procedure: HOLMIUM LASER APPLICATION;  Surgeon: Bjorn Pippin, MD;  Location: [facility]  Service: Urology;  Laterality: Right;  . TOTAL HIP ARTHROPLASTY Bilateral 2015  . URETEROLITHOTOMY  x2  last one 2007        Home Medications    Prior to Admission medications   Medication Sig Start Date End Date Taking? Authorizing Provider  ALPRAZolam Prudy Feeler) 0.5 MG tablet Take 0.5 mg by mouth 2 (two) times daily as needed for anxiety.  08/01/16   [provider]  ibuprofen (ADVIL,MOTRIN) 400 MG tablet Take 1 tablet (400 mg total) by mouth every 6 (six) hours as needed for mild pain or moderate pain. 04/01/18   Shaune Pollack, MD  metoCLOPramide (REGLAN) 10 MG tablet Take 1 tablet (10 mg total) by mouth every 6 (six) hours as needed for nausea (nausea/headache). 08/05/18   Timothey Dahlstrom, Barbara Cower, MD  metoprolol (LOPRESSOR) 50 MG tablet Take 50 mg by mouth 2 (two) times daily.    [provider]  oxyCODONE-acetaminophen (PERCOCET) 5-325 MG tablet Take 2 tablets by mouth every 4 (four) hours as needed. 08/05/18   Averie Meiner, Barbara Cower, MD    Family History Family History  Problem Relation Age of Onset  . Hypertension Father     Social History Social History   Tobacco Use  . Smoking status: Former Smoker    Packs/day: 0.50    Years:  15.00    Pack years: 7.50  . Smokeless tobacco: Current User    Types: Snuff  Substance Use Topics  . Alcohol use: Yes    Comment: occassionally  . Drug use: No     Allergies   Patient has no known allergies.   Review of Systems Review of Systems  Genitourinary: Positive for flank pain.  All other systems reviewed and are negative.    Physical Exam Updated Vital Signs BP 105/75   Pulse 72   Temp 98.3 F (36.8 C) (Oral)   Resp 20   Ht 5\' 9"  (1.753 m)   Wt 117.9 kg   SpO2 97%   BMI 38.40 kg/m   Physical Exam Vitals signs and nursing note reviewed.   Constitutional:      Appearance: He is well-developed.  HENT:     Head: Normocephalic and atraumatic.  Eyes:     Extraocular Movements: Extraocular movements intact.     Conjunctiva/sclera: Conjunctivae normal.  Neck:     Musculoskeletal: Normal range of motion.  Cardiovascular:     Rate and Rhythm: Normal rate.  Pulmonary:     Effort: Pulmonary effort is normal. No respiratory distress.  Abdominal:     General: Abdomen is flat. There is no distension.  Musculoskeletal: Normal range of motion.  Skin:    General: Skin is warm and dry.  Neurological:     General: No focal deficit present.     Mental Status: He is alert.      ED Treatments / Results  Labs (all labs ordered are listed, but only abnormal results are displayed) Labs Reviewed  COMPREHENSIVE METABOLIC PANEL - Abnormal; Notable for the following components:      Result Value   AST 49 (*)    ALT 51 (*)    All other components within normal limits  URINALYSIS, ROUTINE W REFLEX MICROSCOPIC - Abnormal; Notable for the following components:   Hgb urine dipstick LARGE (*)    All other components within normal limits  CBC WITH DIFFERENTIAL/PLATELET    EKG None  Radiology No results found.  Procedures Procedures (including critical care time)  Medications Ordered in ED Medications  lactated ringers bolus 1,000 mL (0 mLs Intravenous Stopped 08/05/18 0117)  ketorolac (TORADOL) 30 MG/ML injection 30 mg (30 mg Intravenous Given 08/04/18 2353)  HYDROmorphone (DILAUDID) injection 1 mg (1 mg Intravenous Given 08/04/18 2353)  HYDROmorphone (DILAUDID) injection 1 mg (1 mg Intravenous Given 08/05/18 0118)  tamsulosin (FLOMAX) capsule 0.4 mg (0.4 mg Oral Given 08/05/18 0118)  oxyCODONE-acetaminophen (PERCOCET/ROXICET) 5-325 MG per tablet 2 tablet (2 tablets Oral Given 08/05/18 0202)  metoCLOPramide (REGLAN) injection 10 mg (10 mg Intravenous Given 08/05/18 0202)     Initial Impression / Assessment and Plan / ED Course  I  have reviewed the triage vital signs and the nursing notes.  Pertinent labs & imaging results that were available during my care of the patient were reviewed by me and considered in my medical decision making (see chart for details).  Likely kidney stone. No AKI, infection or leukocytosis. No indication for imaging at this time. Will work on symptomatic treatmetn.   Improved symptoms. Stable for dc w/ urology follow up.   Final Clinical Impressions(s) / ED Diagnoses   Final diagnoses:  Kidney stone    ED Discharge Orders         Ordered    oxyCODONE-acetaminophen (PERCOCET) 5-325 MG tablet  Every 4 hours PRN     08/05/18 0221  metoCLOPramide (REGLAN) 10 MG tablet  Every 6 hours PRN     08/05/18 0221           Jadian Karman, Barbara CowerJason, MD 08/05/18 [phone]

## 2018-08-05 NOTE — ED Notes (Signed)
Pt given urinal for UA

## 2018-08-16 DIAGNOSIS — I10 Essential (primary) hypertension: Secondary | ICD-10-CM | POA: Diagnosis not present

## 2018-08-16 DIAGNOSIS — F419 Anxiety disorder, unspecified: Secondary | ICD-10-CM | POA: Diagnosis not present

## 2018-08-16 DIAGNOSIS — M545 Low back pain: Secondary | ICD-10-CM | POA: Diagnosis not present

## 2018-09-15 DIAGNOSIS — M545 Low back pain: Secondary | ICD-10-CM | POA: Diagnosis not present

## 2018-09-15 DIAGNOSIS — I10 Essential (primary) hypertension: Secondary | ICD-10-CM | POA: Diagnosis not present

## 2018-09-15 DIAGNOSIS — E669 Obesity, unspecified: Secondary | ICD-10-CM | POA: Diagnosis not present

## 2018-09-15 DIAGNOSIS — K210 Gastro-esophageal reflux disease with esophagitis: Secondary | ICD-10-CM | POA: Diagnosis not present

## 2018-09-25 ENCOUNTER — Emergency Department (HOSPITAL_COMMUNITY)
Admission: EM | Admit: 2018-09-25 | Discharge: 2018-09-26 | Disposition: A | Discharge status: Home / Self Care | Payer: Medicare Other | Attending: Emergency Medicine | Admitting: Emergency Medicine

## 2018-09-25 ENCOUNTER — Other Ambulatory Visit: Payer: Self-pay

## 2018-09-25 ENCOUNTER — Emergency Department (HOSPITAL_COMMUNITY): Payer: Medicare Other

## 2018-09-25 ENCOUNTER — Encounter (HOSPITAL_COMMUNITY): Payer: Self-pay | Admitting: *Deleted

## 2018-09-25 DIAGNOSIS — I10 Essential (primary) hypertension: Secondary | ICD-10-CM | POA: Insufficient documentation

## 2018-09-25 DIAGNOSIS — Z79899 Other long term (current) drug therapy: Secondary | ICD-10-CM | POA: Diagnosis not present

## 2018-09-25 DIAGNOSIS — N200 Calculus of kidney: Secondary | ICD-10-CM

## 2018-09-25 DIAGNOSIS — R1031 Right lower quadrant pain: Secondary | ICD-10-CM | POA: Diagnosis present

## 2018-09-25 DIAGNOSIS — F17220 Nicotine dependence, chewing tobacco, uncomplicated: Secondary | ICD-10-CM | POA: Insufficient documentation

## 2018-09-25 DIAGNOSIS — N23 Unspecified renal colic: Secondary | ICD-10-CM | POA: Insufficient documentation

## 2018-09-25 LAB — URINALYSIS, ROUTINE W REFLEX MICROSCOPIC
Bacteria, UA: NONE SEEN
Bilirubin Urine: NEGATIVE
Glucose, UA: NEGATIVE mg/dL
Ketones, ur: 5 mg/dL — AB
Leukocytes,Ua: NEGATIVE
Nitrite: NEGATIVE
Protein, ur: NEGATIVE mg/dL
RBC / HPF: >50 RBC/hpf — ABNORMAL HIGH (ref 0–5)
Specific Gravity, Urine: 1.016 (ref 1.005–1.030)
pH: 7 (ref 5.0–8.0)

## 2018-09-25 MED ORDER — MORPHINE SULFATE (PF) 4 MG/ML IV SOLN
4.0000 mg | Freq: Once | INTRAVENOUS | Status: AC
  Administered 2018-09-25: 4 mg via INTRAVENOUS
  Filled 2018-09-25: qty 1

## 2018-09-25 MED ORDER — ONDANSETRON HCL 4 MG/2ML IJ SOLN
4.0000 mg | Freq: Once | INTRAMUSCULAR | Status: AC
  Administered 2018-09-25: 4 mg via INTRAVENOUS
  Filled 2018-09-25: qty 2

## 2018-09-25 NOTE — ED Triage Notes (Signed)
Pt c/o right groin pain with difficulty urinating; pt has a hx of kidney stones

## 2018-09-26 DIAGNOSIS — N200 Calculus of kidney: Secondary | ICD-10-CM | POA: Diagnosis not present

## 2018-09-26 LAB — BASIC METABOLIC PANEL
Anion gap: 13 (ref 5–15)
BUN: 9 mg/dL (ref 6–20)
CO2: 25 mmol/L (ref 22–32)
Calcium: 9.8 mg/dL (ref 8.9–10.3)
Chloride: 104 mmol/L (ref 98–111)
Creatinine, Ser: 0.9 mg/dL (ref 0.61–1.24)
GFR calc Af Amer: >60 mL/min (ref >60)
GFR calc non Af Amer: >60 mL/min (ref >60)
Glucose, Bld: 98 mg/dL (ref 70–99)
Potassium: 3.7 mmol/L (ref 3.5–5.1)
Sodium: 142 mmol/L (ref 135–145)

## 2018-09-26 MED ORDER — TAMSULOSIN HCL 0.4 MG PO CAPS: 0.4000 mg | ORAL_CAPSULE | Freq: Every day | ORAL | 0 refills | Status: DC

## 2018-09-26 MED ORDER — TAMSULOSIN HCL 0.4 MG PO CAPS
0.4000 mg | ORAL_CAPSULE | Freq: Once | ORAL | Status: AC
  Administered 2018-09-26: 02:00:00 0.4 mg via ORAL
  Filled 2018-09-26: qty 1

## 2018-09-26 MED ORDER — HYDROMORPHONE HCL 1 MG/ML IJ SOLN
1.0000 mg | Freq: Once | INTRAMUSCULAR | Status: AC
  Administered 2018-09-26: 01:00:00 1 mg via INTRAVENOUS
  Filled 2018-09-26: qty 1

## 2018-09-26 MED ORDER — KETOROLAC TROMETHAMINE 30 MG/ML IJ SOLN
15.0000 mg | Freq: Once | INTRAMUSCULAR | Status: AC
  Administered 2018-09-26: 02:00:00 15 mg via INTRAVENOUS
  Filled 2018-09-26: qty 1

## 2018-09-26 NOTE — Discharge Instructions (Addendum)
I suspect you are passing a kidney stone, even though the CT scan did not pick one up tonight, given your symptoms and blood in your urine this is the most likely cause.  You may need to increase your hydrocodone for better pain relief. Take your next dose of flomax tomorrow night.  Get rechecked for any fevers, vomiting or worsened pain.

## 2018-09-26 NOTE — ED Provider Notes (Signed)
[facility] EMERGENCY DEPARTMENT Provider Note   CSN: [id number] Arrival date & time: 09/25/18  2100    History   Chief Complaint Chief Complaint  Patient presents with  . Groin Pain    HPI Jorge Nichols is a 28 y.o. male with history significant for HTN, avascular necrosis (from trauma) resulting in bilateral hip replacement, hyperparathyroidism, kidney stones, stating he passes stones at least every month, usually without any difficulty but has had to undergo lithotripsy with larger stones in the past, presenting with right flank pain starting this morning, now with radiation into his right suprapubic area along with dysuria and hematuria.  He denies fevers, chills, n/v, also no penile discharge, scrotal pain or swelling.  He takes hydrocodone daily for chronic back pain which has not relieved his pain today. He is a patient of Dr. Jeffie Pollock, has not seen in 2 years as he usually passes stones without difficulty.     The history is provided by the patient.    Past Medical History:  Diagnosis Date  . Anxiety   . Chronic headaches   . Chronic hip pain   . Hematuria   . History of avascular necrosis of capital femoral epiphysis    bilateral hip s/p  replacement's  . Hypertension   . Mixed dyslipidemia   . Nephrolithiasis    bilateral  non-obstuctive per ct 12-25-2015  . Other hyperparathyroidism ([place])   . Right ureteral stone   . Vitamin D deficiency   . Weak urinary stream     Patient Active Problem List   Diagnosis Date Noted  . Mesenteric panniculitis ([place]) 03/30/2018  . Vitamin D deficiency 06/21/2015  . Other hyperparathyroidism ([place]) 06/05/2015  . BP (high blood pressure) 05/13/2015  . Calculus of kidney 05/13/2015  . Arthralgia of hip 12/04/2013  . Iliopsoas bursitis 12/04/2013  . History of repair of hip joint 07/03/2013  . Avascular necrosis of bone ([place]) 03/20/2013  . Mixed dyslipidemia 09/09/2012  . Metabolic syndrome 67/89/3810  . Morbid obesity ([place])  09/09/2012  . Sleep disorder breathing 09/09/2012  . Family history of premature CAD 09/09/2012    Past Surgical History:  Procedure Laterality Date  . CYSTOSCOPY WITH URETEROSCOPY, STONE BASKETRY AND STENT PLACEMENT Right 01/02/2016   Procedure: CYSTOSCOPY WITH RIGHT URETEROSCOPY, STONE EXTRACTION  AND STENT PLACEMENT;  Surgeon: Irine Seal, MD;  Location: [facility];  Service: Urology;  Laterality: Right;  . EXTRACORPOREAL SHOCK WAVE LITHOTRIPSY  yrs ago  . HIP SURGERY Bilateral 2015  . HOLMIUM LASER APPLICATION Right 17/51/0258   Procedure: HOLMIUM LASER APPLICATION;  Surgeon: Irine Seal, MD;  Location: [facility];  Service: Urology;  Laterality: Right;  . TOTAL HIP ARTHROPLASTY Bilateral 2015  . URETEROLITHOTOMY  x2  last one 2007        Home Medications    Prior to Admission medications   Medication Sig Start Date End Date Taking? Authorizing Provider  ALPRAZolam Duanne Moron) 0.5 MG tablet Take 0.5 mg by mouth 2 (two) times daily as needed for anxiety.  08/01/16   [provider]  ibuprofen (ADVIL,MOTRIN) 400 MG tablet Take 1 tablet (400 mg total) by mouth every 6 (six) hours as needed for mild pain or moderate pain. 04/01/18   Demetrios Loll, MD  metoCLOPramide (REGLAN) 10 MG tablet Take 1 tablet (10 mg total) by mouth every 6 (six) hours as needed for nausea (nausea/headache). 08/05/18   Mesner, Corene Cornea, MD  metoprolol (LOPRESSOR) 50 MG tablet Take 50 mg by  mouth 2 (two) times daily.    [provider]  oxyCODONE-acetaminophen (PERCOCET) 5-325 MG tablet Take 2 tablets by mouth every 4 (four) hours as needed. 08/05/18   Mesner, Barbara CowerJason, MD  tamsulosin (FLOMAX) 0.4 MG CAPS capsule Take 1 capsule (0.4 mg total) by mouth daily after supper. 09/26/18   Burgess AmorIdol, Ramesh Moan, PA-C    Family History Family History  Problem Relation Age of Onset  . Hypertension Father     Social History Social History   Tobacco Use  . Smoking status: Former Smoker     Packs/day: 0.50    Years: 15.00    Pack years: 7.50  . Smokeless tobacco: Current User    Types: Snuff  Substance Use Topics  . Alcohol use: Yes    Comment: occassionally  . Drug use: No     Allergies   Patient has no known allergies.   Review of Systems Review of Systems  Constitutional: Negative for chills and fever.  HENT: Negative for congestion and sore throat.   Eyes: Negative.   Respiratory: Negative for chest tightness and shortness of breath.   Cardiovascular: Negative for chest pain.  Gastrointestinal: Negative for abdominal pain, constipation, nausea and vomiting.  Genitourinary: Positive for dysuria, flank pain and hematuria. Negative for discharge, frequency, penile pain, penile swelling and scrotal swelling.  Musculoskeletal: Negative for arthralgias, joint swelling and neck pain.  Skin: Negative.  Negative for rash and wound.  Neurological: Negative for dizziness, weakness, light-headedness, numbness and headaches.  Psychiatric/Behavioral: Negative.      Physical Exam Updated Vital Signs BP 138/89   Pulse (!) 110   Temp 98 F (36.7 C) (Oral)   Resp 20   SpO2 96%   Physical Exam Vitals signs and nursing note reviewed.  Constitutional:      Appearance: He is well-developed.  HENT:     Head: Normocephalic and atraumatic.  Eyes:     Conjunctiva/sclera: Conjunctivae normal.  Neck:     Musculoskeletal: Normal range of motion.  Cardiovascular:     Rate and Rhythm: Normal rate and regular rhythm.     Heart sounds: Normal heart sounds.  Pulmonary:     Effort: Pulmonary effort is normal.     Breath sounds: Normal breath sounds. No wheezing.  Abdominal:     General: Bowel sounds are normal. There is no distension.     Palpations: Abdomen is soft. There is no mass.     Tenderness: There is abdominal tenderness in the suprapubic area. There is no right CVA tenderness, left CVA tenderness, guarding or rebound.     Comments: Mildly ttp suprapubic region.  No guarding.  Musculoskeletal: Normal range of motion.  Skin:    General: Skin is warm and dry.  Neurological:     Mental Status: He is alert.      ED Treatments / Results  Labs (all labs ordered are listed, but only abnormal results are displayed) Labs Reviewed  URINALYSIS, ROUTINE W REFLEX MICROSCOPIC - Abnormal; Notable for the following components:      Result Value   Hgb urine dipstick MODERATE (*)    Ketones, ur 5 (*)    RBC / HPF >50 (*)    All other components within normal limits  BASIC METABOLIC PANEL    EKG None  Radiology Ct Renal Stone Study  Result Date: 09/26/2018 CLINICAL DATA:  Flank pain EXAM: CT ABDOMEN AND PELVIS WITHOUT CONTRAST TECHNIQUE: Multidetector CT imaging of the abdomen and pelvis was performed following the standard  protocol without IV contrast. COMPARISON:  CT 03/30/2018, 07/09/2017 FINDINGS: Lower chest: No acute abnormality. Hepatobiliary: No focal liver abnormality is seen. No gallstones, gallbladder wall thickening, or biliary dilatation. Hepatic steatosis with fat sparing near the gallbladder fossa Pancreas: Unremarkable. No pancreatic ductal dilatation or surrounding inflammatory changes. Spleen: Normal in size without focal abnormality. Adrenals/Urinary Tract: Adrenal glands are normal. No hydronephrosis. Multiple small intrarenal stones bilaterally. The largest stone on the right is seen in the lower pole and measures 5 mm. Largest stone on the left is also seen in the lower pole and measures 3 mm. The urinary bladder is unremarkable. Stomach/Bowel: Stomach is within normal limits. Appendix appears normal. No evidence of bowel wall thickening, distention, or inflammatory changes. Vascular/Lymphatic: Nonaneurysmal aorta. No significantly enlarged lymph nodes Reproductive: Prostate is unremarkable. Other: Negative for free air or free fluid. Small fat containing periumbilical hernia Musculoskeletal: Bilateral hip replacements with artifact. No acute  or suspicious osseous abnormality IMPRESSION: 1. Negative for hydronephrosis or ureteral stone. 2. Multiple small intrarenal stones bilaterally 3. Hepatic steatosis Electronically Signed   By: Jasmine PangKim  Fujinaga M.D.   On: 09/26/2018 00:53    Procedures Procedures (including critical care time)  Medications Ordered in ED Medications  morphine 4 MG/ML injection 4 mg (4 mg Intravenous Given 09/25/18 2346)  ondansetron (ZOFRAN) injection 4 mg (4 mg Intravenous Given 09/25/18 2346)  HYDROmorphone (DILAUDID) injection 1 mg (1 mg Intravenous Given 09/26/18 0052)  ketorolac (TORADOL) 30 MG/ML injection 15 mg (15 mg Intravenous Given 09/26/18 0131)  tamsulosin (FLOMAX) capsule 0.4 mg (0.4 mg Oral Given 09/26/18 0131)     Initial Impression / Assessment and Plan / ED Course  I have reviewed the triage vital signs and the nursing notes.  Pertinent labs & imaging results that were available during my care of the patient were reviewed by me and considered in my medical decision making (see chart for details).        Current and past hx most suggesting of passage of kidney stone. Multiple stones bilateral kidneys, no ureteral stone.  Pt states he passes 2 kinds, brown smoother stones and clear crystal like stones, suspect radiolucent stone or recent passage with continued renal colic.  He was placed on flomax, also has hydrocodone at home for prn use - advised increasing to 1.5 or 2 tabs since 1 tab earlier not effective at pain control.  Labs and imaging reviewed.  Appendix visualized, not inflamed, no exam findings or CT findings to suggest intestinal or renal obstruction. Advised close f/u with Dr Annabell HowellsWrenn if sx persist. Strict return precautions also discussed.  Final Clinical Impressions(s) / ED Diagnoses   Final diagnoses:  Ureteral colic  Kidney stone    ED Discharge Orders         Ordered    tamsulosin (FLOMAX) 0.4 MG CAPS capsule  Daily after supper     09/26/18 0133           Burgess Amordol, Trinaty Bundrick, PA-C  09/26/18 1324    Devoria AlbeKnapp, Iva, MD 09/28/18 1336

## 2018-10-22 ENCOUNTER — Emergency Department (HOSPITAL_COMMUNITY)
Admission: EM | Admit: 2018-10-22 | Discharge: 2018-10-22 | Disposition: A | Discharge status: Home / Self Care | Payer: Medicare Other | Attending: Emergency Medicine | Admitting: Emergency Medicine

## 2018-10-22 ENCOUNTER — Encounter (HOSPITAL_COMMUNITY): Payer: Self-pay | Admitting: Emergency Medicine

## 2018-10-22 ENCOUNTER — Other Ambulatory Visit: Payer: Self-pay

## 2018-10-22 DIAGNOSIS — M109 Gout, unspecified: Secondary | ICD-10-CM | POA: Diagnosis not present

## 2018-10-22 DIAGNOSIS — Z96643 Presence of artificial hip joint, bilateral: Secondary | ICD-10-CM | POA: Diagnosis not present

## 2018-10-22 DIAGNOSIS — Z87891 Personal history of nicotine dependence: Secondary | ICD-10-CM | POA: Insufficient documentation

## 2018-10-22 DIAGNOSIS — M79671 Pain in right foot: Secondary | ICD-10-CM | POA: Diagnosis present

## 2018-10-22 LAB — URIC ACID: Uric Acid, Serum: 13.5 mg/dL — ABNORMAL HIGH (ref 3.7–8.6)

## 2018-10-22 MED ORDER — KETOROLAC TROMETHAMINE 30 MG/ML IJ SOLN
30.0000 mg | Freq: Once | INTRAMUSCULAR | Status: DC
  Filled 2018-10-22: qty 1

## 2018-10-22 MED ORDER — INDOMETHACIN 50 MG PO CAPS: ORAL_CAPSULE | ORAL | 0 refills | Status: DC

## 2018-10-22 MED ORDER — ALLOPURINOL 300 MG PO TABS: ORAL_TABLET | ORAL | 0 refills | Status: DC

## 2018-10-22 MED ORDER — PREDNISONE 50 MG PO TABS
60.0000 mg | ORAL_TABLET | Freq: Once | ORAL | Status: AC
  Administered 2018-10-22: 60 mg via ORAL
  Filled 2018-10-22: qty 1

## 2018-10-22 MED ORDER — COLCHICINE 0.6 MG PO TABS: 0.6000 mg | ORAL_TABLET | Freq: Every day | ORAL | 0 refills | Status: DC

## 2018-10-22 MED ORDER — KETOROLAC TROMETHAMINE 30 MG/ML IJ SOLN
30.0000 mg | Freq: Once | INTRAMUSCULAR | Status: AC
  Administered 2018-10-22: 30 mg via INTRAMUSCULAR

## 2018-10-22 MED ORDER — COLCHICINE 0.6 MG PO TABS
0.6000 mg | ORAL_TABLET | Freq: Once | ORAL | Status: AC
  Administered 2018-10-22: 04:00:00 0.6 mg via ORAL
  Filled 2018-10-22: qty 1

## 2018-10-22 NOTE — ED Provider Notes (Signed)
[facility] EMERGENCY DEPARTMENT Provider Note   CSN: [id number] Arrival date & time: 10/22/18  0308  Time seen 3:55 AM  History   Chief Complaint Chief Complaint  Patient presents with  . Toe Pain    HPI Jorge Nichols is a 28 y.o. male.     HPI patient states on July 31 she started having pain around the MCP joint of his right great toe and in the arch of his foot.  He denies any known trauma.  He denies any fever.  He states he is never had it before.  Patient has a long history of kidney stones and states he has 2 types, one is calcium and he does not know the other type.  PCP Kari BaarsHawkins, Edward, MD   Past Medical History:  Diagnosis Date  . Anxiety   . Chronic headaches   . Chronic hip pain   . Hematuria   . History of avascular necrosis of capital femoral epiphysis    bilateral hip s/p  replacement's  . Hypertension   . Mixed dyslipidemia   . Nephrolithiasis    bilateral  non-obstuctive per ct 12-25-2015  . Other hyperparathyroidism ([facility])   . Right ureteral stone   . Vitamin D deficiency   . Weak urinary stream     Patient Active Problem List   Diagnosis Date Noted  . Mesenteric panniculitis ([facility]) 03/30/2018  . Vitamin D deficiency 06/21/2015  . Other hyperparathyroidism ([facility]) 06/05/2015  . BP (high blood pressure) 05/13/2015  . Calculus of kidney 05/13/2015  . Arthralgia of hip 12/04/2013  . Iliopsoas bursitis 12/04/2013  . History of repair of hip joint 07/03/2013  . Avascular necrosis of bone ([facility]) 03/20/2013  . Mixed dyslipidemia 09/09/2012  . Metabolic syndrome 09/09/2012  . Morbid obesity ([facility]) 09/09/2012  . Sleep disorder breathing 09/09/2012  . Family history of premature CAD 09/09/2012    Past Surgical History:  Procedure Laterality Date  . CYSTOSCOPY WITH URETEROSCOPY, STONE BASKETRY AND STENT PLACEMENT Right 01/02/2016   Procedure: CYSTOSCOPY WITH RIGHT URETEROSCOPY, STONE EXTRACTION  AND STENT PLACEMENT;  Surgeon: Bjorn PippinJohn Wrenn, MD;   [facility];  Service: Urology;  Laterality: Right;  . EXTRACORPOREAL SHOCK WAVE LITHOTRIPSY  yrs ago  . HIP SURGERY Bilateral 2015  . HOLMIUM LASER APPLICATION Right 01/02/2016   Procedure: HOLMIUM LASER APPLICATION;  Surgeon: Bjorn PippinJohn Wrenn, MD;  [facility];  Service: Urology;  Laterality: Right;  . TOTAL HIP ARTHROPLASTY Bilateral 2015  . URETEROLITHOTOMY  x2  last one 2007        Home Medications    Prior to Admission medications   Medication Sig Start Date End Date Taking? Authorizing Provider  allopurinol (ZYLOPRIM) 300 MG tablet Take 1 po QD x 1 week the BID 10/22/18   Devoria AlbeKnapp, Panhia Karl, MD  ALPRAZolam Prudy Feeler(XANAX) 0.5 MG tablet Take 0.5 mg by mouth 2 (two) times daily as needed for anxiety.  08/01/16   [provider]  colchicine 0.6 MG tablet Take 1 tablet (0.6 mg total) by mouth daily. 10/22/18   Devoria AlbeKnapp, Tejal Monroy, MD  ibuprofen (ADVIL,MOTRIN) 400 MG tablet Take 1 tablet (400 mg total) by mouth every 6 (six) hours as needed for mild pain or moderate pain. 04/01/18   Shaune Pollackhen, Qing, MD  indomethacin (INDOCIN) 50 MG capsule Take 1 po QID x 2d then  1 po TID x 3d , then 1 po BID x 3d then 1 po QD x 3d WITH FOOD 10/22/18   Lynelle DoctorKnapp,  Inetha Maret, MD  metoCLOPramide (REGLAN) 10 MG tablet Take 1 tablet (10 mg total) by mouth every 6 (six) hours as needed for nausea (nausea/headache). 08/05/18   Mesner, Corene Cornea, MD  metoprolol (LOPRESSOR) 50 MG tablet Take 50 mg by mouth 2 (two) times daily.    [provider]  oxyCODONE-acetaminophen (PERCOCET) 5-325 MG tablet Take 2 tablets by mouth every 4 (four) hours as needed. 08/05/18   Mesner, Corene Cornea, MD  tamsulosin (FLOMAX) 0.4 MG CAPS capsule Take 1 capsule (0.4 mg total) by mouth daily after supper. 09/26/18   Evalee Jefferson, PA-C    Family History Family History  Problem Relation Age of Onset  . Hypertension Father     Social History Social History   Tobacco Use  . Smoking status: Former Smoker    Packs/day: 0.50     Years: 15.00    Pack years: 7.50  . Smokeless tobacco: Current User    Types: Snuff  Substance Use Topics  . Alcohol use: Yes    Comment: occassionally  . Drug use: No     Allergies   Patient has no known allergies.   Review of Systems Review of Systems  All other systems reviewed and are negative.    Physical Exam Updated Vital Signs BP 122/70 (BP Location: Right Arm)   Pulse 69   Temp 98.2 F (36.8 C) (Oral)   Resp 17   Ht 5\' 9"  (1.753 m)   Wt 127 kg   SpO2 98%   BMI 41.35 kg/m   Vital signs normal    Physical Exam Vitals signs and nursing note reviewed.  Constitutional:      Appearance: Normal appearance. He is obese.  HENT:     Head: Normocephalic and atraumatic.     Right Ear: External ear normal.     Left Ear: External ear normal.  Eyes:     Extraocular Movements: Extraocular movements intact.     Conjunctiva/sclera: Conjunctivae normal.  Neck:     Musculoskeletal: Normal range of motion.  Cardiovascular:     Rate and Rhythm: Normal rate.  Pulmonary:     Effort: Pulmonary effort is normal. No respiratory distress.  Musculoskeletal:        General: Swelling and tenderness present.     Comments: Patient has a mild diffuse swelling of his right foot without erythema or warmth.  He is very tender to palpation over the MTP joint of his right great toe.  He is nontender in his ankle, heel, or around his MTP joints of his other toes.  Skin:    General: Skin is warm and dry.     Findings: No erythema or rash.  Neurological:     General: No focal deficit present.     Mental Status: He is alert and oriented to person, place, and time.     Cranial Nerves: No cranial nerve deficit.  Psychiatric:        Mood and Affect: Mood normal.        Behavior: Behavior normal.        Thought Content: Thought content normal.      ED Treatments / Results  Labs (all labs ordered are listed, but only abnormal results are displayed) Labs Reviewed  URIC ACID -  Abnormal; Notable for the following components:      Result Value   Uric Acid, Serum 13.5 (*)    All other components within normal limits   Laboratory interpretation all normal except very elevated uric acid  EKG None  Radiology No results found.  Procedures Procedures (including critical care time)  Medications Ordered in ED Medications  colchicine tablet 0.6 mg (0.6 mg Oral Given 10/22/18 0410)  ketorolac (TORADOL) 30 MG/ML injection 30 mg (30 mg Intramuscular Given 10/22/18 0410)  predniSONE (DELTASONE) tablet 60 mg (60 mg Oral Given 10/22/18 0543)     Initial Impression / Assessment and Plan / ED Course  I have reviewed the triage vital signs and the nursing notes.  Pertinent labs & imaging results that were available during my care of the patient were reviewed by me and considered in my medical decision making (see chart for details).    I reviewed patient's prior tests and there is no prior uric acid level in our chart system.  I reviewed a  old urology note from 2017 by Dr. Annabell HowellsWrenn and the urologist described that the patient has hyperuricemia.  That is not in his problem list currently.  Patient last had blood work on July 5.  His BUN was 9 and his creatinine was 0.9.  He was given Toradol IM and colchicine orally.  Uric acid level was drawn.  Patient continues to complain of pain, he was given prednisone orally.  [place] shows he gets #60 hydrocodone 7.5/325 mg on a regular basis by his PCP, last filled July 16.  He also gets alprazolam 0.25 mg tablets on a regular basis.  His overdose risk score is 600.  Final Clinical Impressions(s) / ED Diagnoses   Final diagnoses:  Podagra  Acute gout involving toe of right foot, unspecified cause    ED Discharge Orders         Ordered    allopurinol (ZYLOPRIM) 300 MG tablet     10/22/18 0559    colchicine 0.6 MG tablet  Daily     10/22/18 0559    indomethacin (INDOCIN) 50 MG capsule     10/22/18  0559         Plan discharge  Devoria AlbeIva Shamecca Whitebread, MD, Concha PyoFACEP    Emanuela Runnion, MD 10/22/18 [phone]

## 2018-10-22 NOTE — ED Triage Notes (Signed)
Pt states the side of his right great toe going up his foot started hurting yesterday. Pt states when he was walking he noticed that it started hurting and after laying down for a couple hours it started to feel worse. No discoloration or abnormality noted. Pt states the pain radiates to the top of his foot.

## 2018-10-22 NOTE — Discharge Instructions (Signed)
You are having an acute gout attack in your right foot/toe.  Take the medications as prescribed.  Please look at the low purine diet and see if there or food you could avoid that would help prevent further flareups of your gout.  You need to let your doctor know that your uric acid level was very high at 13.5.  You will need to stay on a medication to lower your uric acid level to prevent further episodes of the gout in your joints and gout related kidney stones.  Recheck if you get a high fever, the skin gets very red or the redness extends up into the foot or ankle area.

## 2018-10-27 DIAGNOSIS — M109 Gout, unspecified: Secondary | ICD-10-CM | POA: Diagnosis not present

## 2018-10-27 DIAGNOSIS — M545 Low back pain: Secondary | ICD-10-CM | POA: Diagnosis not present

## 2018-10-27 DIAGNOSIS — Z87442 Personal history of urinary calculi: Secondary | ICD-10-CM | POA: Diagnosis not present

## 2018-10-27 DIAGNOSIS — I10 Essential (primary) hypertension: Secondary | ICD-10-CM | POA: Diagnosis not present

## 2019-01-27 DIAGNOSIS — M545 Low back pain: Secondary | ICD-10-CM | POA: Diagnosis not present

## 2019-01-27 DIAGNOSIS — Z87442 Personal history of urinary calculi: Secondary | ICD-10-CM | POA: Diagnosis not present

## 2019-01-27 DIAGNOSIS — E669 Obesity, unspecified: Secondary | ICD-10-CM | POA: Diagnosis not present

## 2019-01-27 DIAGNOSIS — I10 Essential (primary) hypertension: Secondary | ICD-10-CM | POA: Diagnosis not present

## 2019-02-03 ENCOUNTER — Encounter (HOSPITAL_COMMUNITY): Payer: Self-pay | Admitting: Emergency Medicine

## 2019-02-03 ENCOUNTER — Emergency Department (HOSPITAL_COMMUNITY)
Admission: EM | Admit: 2019-02-03 | Discharge: 2019-02-04 | Disposition: A | Discharge status: Home / Self Care | Payer: Medicare Other | Attending: Emergency Medicine | Admitting: Emergency Medicine

## 2019-02-03 ENCOUNTER — Other Ambulatory Visit: Payer: Self-pay

## 2019-02-03 DIAGNOSIS — N200 Calculus of kidney: Secondary | ICD-10-CM | POA: Diagnosis not present

## 2019-02-03 DIAGNOSIS — Z79899 Other long term (current) drug therapy: Secondary | ICD-10-CM | POA: Insufficient documentation

## 2019-02-03 DIAGNOSIS — Z87891 Personal history of nicotine dependence: Secondary | ICD-10-CM | POA: Insufficient documentation

## 2019-02-03 DIAGNOSIS — R1030 Lower abdominal pain, unspecified: Secondary | ICD-10-CM | POA: Diagnosis present

## 2019-02-03 DIAGNOSIS — I10 Essential (primary) hypertension: Secondary | ICD-10-CM | POA: Insufficient documentation

## 2019-02-03 NOTE — ED Triage Notes (Signed)
Pt c/o pain that started on the left flank that has now moved to his groin. Pt has hx of kidney stones and feels that he has them again.

## 2019-02-04 ENCOUNTER — Emergency Department (HOSPITAL_COMMUNITY): Payer: Medicare Other

## 2019-02-04 DIAGNOSIS — N200 Calculus of kidney: Secondary | ICD-10-CM | POA: Diagnosis not present

## 2019-02-04 LAB — URINALYSIS, ROUTINE W REFLEX MICROSCOPIC
Bacteria, UA: NONE SEEN
Bilirubin Urine: NEGATIVE
Glucose, UA: NEGATIVE mg/dL
Ketones, ur: NEGATIVE mg/dL
Leukocytes,Ua: NEGATIVE
Nitrite: NEGATIVE
Protein, ur: NEGATIVE mg/dL
RBC / HPF: >50 RBC/hpf — ABNORMAL HIGH (ref 0–5)
Specific Gravity, Urine: 1.017 (ref 1.005–1.030)
pH: 6 (ref 5.0–8.0)

## 2019-02-04 LAB — CBC WITH DIFFERENTIAL/PLATELET
Abs Immature Granulocytes: 0.02 10*3/uL (ref 0.00–0.07)
Basophils Absolute: 0.1 10*3/uL (ref 0.0–0.1)
Basophils Relative: 1 %
Eosinophils Absolute: 0.5 10*3/uL (ref 0.0–0.5)
Eosinophils Relative: 6 %
HCT: 46.9 % (ref 39.0–52.0)
Hemoglobin: 15.9 g/dL (ref 13.0–17.0)
Immature Granulocytes: 0 %
Lymphocytes Relative: 43 %
Lymphs Abs: 3.3 10*3/uL (ref 0.7–4.0)
MCH: 34.3 pg — ABNORMAL HIGH (ref 26.0–34.0)
MCHC: 33.9 g/dL (ref 30.0–36.0)
MCV: 101.1 fL — ABNORMAL HIGH (ref 80.0–100.0)
Monocytes Absolute: 0.8 10*3/uL (ref 0.1–1.0)
Monocytes Relative: 10 %
Neutro Abs: 3.2 10*3/uL (ref 1.7–7.7)
Neutrophils Relative %: 40 %
Platelets: 280 10*3/uL (ref 150–400)
RBC: 4.64 MIL/uL (ref 4.22–5.81)
RDW: 12.3 % (ref 11.5–15.5)
WBC: 7.9 10*3/uL (ref 4.0–10.5)
nRBC: 0 % (ref 0.0–0.2)

## 2019-02-04 LAB — BASIC METABOLIC PANEL
Anion gap: 9 (ref 5–15)
BUN: 6 mg/dL (ref 6–20)
CO2: 22 mmol/L (ref 22–32)
Calcium: 9.3 mg/dL (ref 8.9–10.3)
Chloride: 108 mmol/L (ref 98–111)
Creatinine, Ser: 0.91 mg/dL (ref 0.61–1.24)
GFR calc Af Amer: >60 mL/min (ref >60)
GFR calc non Af Amer: >60 mL/min (ref >60)
Glucose, Bld: 103 mg/dL — ABNORMAL HIGH (ref 70–99)
Potassium: 3.5 mmol/L (ref 3.5–5.1)
Sodium: 139 mmol/L (ref 135–145)

## 2019-02-04 MED ORDER — ONDANSETRON 4 MG PO TBDP
4.0000 mg | ORAL_TABLET | Freq: Once | ORAL | Status: AC
  Administered 2019-02-04: 4 mg via ORAL
  Filled 2019-02-04: qty 1

## 2019-02-04 MED ORDER — HYDROMORPHONE HCL 2 MG/ML IJ SOLN
2.0000 mg | Freq: Once | INTRAMUSCULAR | Status: AC
  Administered 2019-02-04: 2 mg via INTRAMUSCULAR
  Filled 2019-02-04: qty 1

## 2019-02-04 MED ORDER — TAMSULOSIN HCL 0.4 MG PO CAPS: 0.4000 mg | ORAL_CAPSULE | Freq: Every day | ORAL | 0 refills | Status: DC

## 2019-02-04 MED ORDER — HYDROMORPHONE HCL 1 MG/ML IJ SOLN
1.0000 mg | Freq: Once | INTRAMUSCULAR | Status: AC
  Administered 2019-02-04: 1 mg via INTRAMUSCULAR
  Filled 2019-02-04: qty 1

## 2019-02-04 MED ORDER — KETOROLAC TROMETHAMINE 60 MG/2ML IM SOLN
30.0000 mg | Freq: Once | INTRAMUSCULAR | Status: AC
  Administered 2019-02-04: 30 mg via INTRAMUSCULAR
  Filled 2019-02-04: qty 2

## 2019-02-04 NOTE — ED Provider Notes (Signed)
[facility] EMERGENCY DEPARTMENT Provider Note   CSN: [id number] Arrival date & time: 02/03/19  2228     History   Chief Complaint Chief Complaint  Patient presents with  . Groin Pain    HPI Jorge Nichols is a 28 y.o. male.     The history is provided by the patient.  Groin Pain This is a recurrent problem. The current episode started more than 1 week ago. The problem occurs constantly. Pertinent negatives include no chest pain and no headaches. Nothing aggravates the symptoms. Nothing relieves the symptoms. Treatments tried: percocet. The treatment provided no relief.    Past Medical History:  Diagnosis Date  . Anxiety   . Chronic headaches   . Chronic hip pain   . Hematuria   . History of avascular necrosis of capital femoral epiphysis    bilateral hip s/p  replacement's  . Hypertension   . Mixed dyslipidemia   . Nephrolithiasis    bilateral  non-obstuctive per ct 12-25-2015  . Other hyperparathyroidism ([place])   . Right ureteral stone   . Vitamin D deficiency   . Weak urinary stream     Patient Active Problem List   Diagnosis Date Noted  . Mesenteric panniculitis ([place]) 03/30/2018  . Vitamin D deficiency 06/21/2015  . Other hyperparathyroidism ([place]) 06/05/2015  . BP (high blood pressure) 05/13/2015  . Calculus of kidney 05/13/2015  . Arthralgia of hip 12/04/2013  . Iliopsoas bursitis 12/04/2013  . History of repair of hip joint 07/03/2013  . Avascular necrosis of bone ([place]) 03/20/2013  . Mixed dyslipidemia 09/09/2012  . Metabolic syndrome 25/07/3974  . Morbid obesity ([place]) 09/09/2012  . Sleep disorder breathing 09/09/2012  . Family history of premature CAD 09/09/2012    Past Surgical History:  Procedure Laterality Date  . CYSTOSCOPY WITH URETEROSCOPY, STONE BASKETRY AND STENT PLACEMENT Right 01/02/2016   Procedure: CYSTOSCOPY WITH RIGHT URETEROSCOPY, STONE EXTRACTION  AND STENT PLACEMENT;  Surgeon: Irine Seal, MD;  Location: [facility];  Service: Urology;  Laterality: Right;  . EXTRACORPOREAL SHOCK WAVE LITHOTRIPSY  yrs ago  . HIP SURGERY Bilateral 2015  . HOLMIUM LASER APPLICATION Right [id number]/[id number]   Procedure: HOLMIUM LASER APPLICATION;  Surgeon: Irine Seal, MD;  Location: [facility];  Service: Urology;  Laterality: Right;  . TOTAL HIP ARTHROPLASTY Bilateral 2015  . URETEROLITHOTOMY  x2  last one 2007        Home Medications    Prior to Admission medications   Medication Sig Start Date End Date Taking? Authorizing Provider  allopurinol (ZYLOPRIM) 300 MG tablet Take 1 po QD x 1 week the BID 10/22/18   Rolland Porter, MD  ALPRAZolam Duanne Moron) 0.5 MG tablet Take 0.5 mg by mouth 2 (two) times daily as needed for anxiety.  08/01/16   [provider]  colchicine 0.6 MG tablet Take 1 tablet (0.6 mg total) by mouth daily. 10/22/18   Rolland Porter, MD  ibuprofen (ADVIL,MOTRIN) 400 MG tablet Take 1 tablet (400 mg total) by mouth every 6 (six) hours as needed for mild pain or moderate pain. 04/01/18   Demetrios Loll, MD  indomethacin (INDOCIN) 50 MG capsule Take 1 po QID x 2d then  1 po TID x 3d , then 1 po BID x 3d then 1 po QD x 3d WITH FOOD 10/22/18   Rolland Porter, MD  metoCLOPramide (REGLAN) 10 MG tablet Take 1 tablet (10 mg total) by mouth every 6 (six) hours as needed for nausea (nausea/headache).  08/05/18   Tyffani Foglesong, Barbara Cower, MD  metoprolol (LOPRESSOR) 50 MG tablet Take 50 mg by mouth 2 (two) times daily.    [provider]  oxyCODONE-acetaminophen (PERCOCET) 5-325 MG tablet Take 2 tablets by mouth every 4 (four) hours as needed. 08/05/18   Suki Crockett, Barbara Cower, MD  tamsulosin (FLOMAX) 0.4 MG CAPS capsule Take 1 capsule (0.4 mg total) by mouth daily. 02/04/19   Gordie Belvin, Barbara Cower, MD    Family History Family History  Problem Relation Age of Onset  . Hypertension Father     Social History Social History   Tobacco Use  . Smoking status: Former Smoker    Packs/day: 0.50    Years: 15.00    Pack years: 7.50  .  Smokeless tobacco: Current User    Types: Snuff  Substance Use Topics  . Alcohol use: Yes    Comment: occassionally  . Drug use: No     Allergies   Patient has no known allergies.   Review of Systems Review of Systems  Cardiovascular: Negative for chest pain.  Neurological: Negative for headaches.  All other systems reviewed and are negative.    Physical Exam Updated Vital Signs BP 127/80 (BP Location: Right Arm)   Pulse 86   Temp 98 F (36.7 C) (Oral)   Resp 19   Ht 5\' 9"  (1.753 m)   Wt 127 kg   SpO2 100%   BMI 41.35 kg/m   Physical Exam Vitals signs and nursing note reviewed.  Constitutional:      Appearance: He is well-developed.     Comments: Initially doing something on his telephone, then  Looks up and appears to be in agonizing pain.   HENT:     Head: Normocephalic and atraumatic.     Mouth/Throat:     Pharynx: Oropharynx is clear.  Eyes:     Conjunctiva/sclera: Conjunctivae normal.  Neck:     Musculoskeletal: Normal range of motion.  Cardiovascular:     Rate and Rhythm: Normal rate.  Pulmonary:     Effort: Pulmonary effort is normal. No respiratory distress.  Abdominal:     General: Bowel sounds are normal. There is no distension.  Musculoskeletal: Normal range of motion.     Comments: No cva ttp  Skin:    General: Skin is warm and dry.  Neurological:     General: No focal deficit present.     Mental Status: He is alert.      ED Treatments / Results  Labs (all labs ordered are listed, but only abnormal results are displayed) Labs Reviewed  URINALYSIS, ROUTINE W REFLEX MICROSCOPIC - Abnormal; Notable for the following components:      Result Value   Hgb urine dipstick LARGE (*)    RBC / HPF >50 (*)    All other components within normal limits  BASIC METABOLIC PANEL - Abnormal; Notable for the following components:   Glucose, Bld 103 (*)    All other components within normal limits  CBC WITH DIFFERENTIAL/PLATELET - Abnormal; Notable  for the following components:   MCV 101.1 (*)    MCH 34.3 (*)    All other components within normal limits    EKG None  Radiology Ct Renal Stone Study  Result Date: 02/04/2019 CLINICAL DATA:  Left flank pain EXAM: CT ABDOMEN AND PELVIS WITHOUT CONTRAST TECHNIQUE: Multidetector CT imaging of the abdomen and pelvis was performed following the standard protocol without IV contrast. COMPARISON:  09/25/2018 FINDINGS: Lower chest: Lung bases are clear. No effusions.  Heart is normal size. Hepatobiliary: Diffuse low-density throughout the liver compatible with fatty infiltration. No focal abnormality. Gallbladder unremarkable. Pancreas: No focal abnormality or ductal dilatation. Spleen: No focal abnormality.  Normal size. Adrenals/Urinary Tract: Punctate bilateral renal stones. No ureteral stones or hydronephrosis. Bilateral hip replacements cause beam hardening artifact in the lower pelvis obscuring the distal ureters and portions of the bladder. No adrenal lesion. Stomach/Bowel: Appendix is normal. Stomach, large and small bowel grossly unremarkable. Vascular/Lymphatic: No evidence of aneurysm or adenopathy. Reproductive: No visible focal abnormality. Other: No free fluid or free air. Musculoskeletal: Bilateral hip replacements. No acute bony abnormality. IMPRESSION: Numerous small bilateral nonobstructing renal stones. No ureteral stones or hydronephrosis. Fatty infiltration of the liver. No acute findings. Electronically Signed   By: Charlett NoseKevin  Dover M.D.   On: 02/04/2019 01:12    Procedures Procedures (including critical care time)  Medications Ordered in ED Medications  ketorolac (TORADOL) injection 30 mg (30 mg Intramuscular Given 02/04/19 0137)  HYDROmorphone (DILAUDID) injection 1 mg (1 mg Intramuscular Given 02/04/19 0137)  ondansetron (ZOFRAN-ODT) disintegrating tablet 4 mg (4 mg Oral Given 02/04/19 0137)  HYDROmorphone (DILAUDID) injection 2 mg (2 mg Intramuscular Given 02/04/19 0414)      Initial Impression / Assessment and Plan / ED Course  I have reviewed the triage vital signs and the nursing notes.  Pertinent labs & imaging results that were available during my care of the patient were reviewed by me and considered in my medical decision making (see chart for details).   Difficult to tell if the patient is malingering or is truly passing kidney stones.  I reviewed the records he has had a couple episodes without small kidney stones a cause hydronephrosis but nothing recently.  He had multiple CT scans recently did not show up but he also has hip arthroplasties which makes CT scans little bit less reliable.  There is blood in his urine and he has a history of them so I treated such as it could be kidney stones referred him to urology but did not give him an outpatient prescription secondary to my trepidation.  Final Clinical Impressions(s) / ED Diagnoses   Final diagnoses:  Kidney stone    ED Discharge Orders         Ordered    tamsulosin (FLOMAX) 0.4 MG CAPS capsule  Daily     02/04/19 0347           Makynleigh Breslin, Barbara CowerJason, MD 02/04/19 [id number]

## 2019-03-23 ENCOUNTER — Ambulatory Visit: Payer: Self-pay | Attending: Internal Medicine

## 2019-03-23 DIAGNOSIS — Z20822 Contact with and (suspected) exposure to covid-19: Secondary | ICD-10-CM

## 2019-03-29 ENCOUNTER — Telehealth: Payer: Self-pay

## 2019-03-29 LAB — NOVEL CORONAVIRUS, NAA

## 2019-03-29 NOTE — Telephone Encounter (Signed)
Patient needs a note stating that he was there to get tested on 03/23/2019 and it was a error and test was not performed

## 2019-03-29 NOTE — Telephone Encounter (Signed)
Letter released to MyChart explaining the reason for the pt. needing to repeat COVID test, that was done on 03/23/19.  Attempted to call pt. To offer to reschedule his appt. for COVID testing.  No vm set up on mobile #.  Left vm on home phone to return call to [phone].

## 2019-04-25 ENCOUNTER — Emergency Department (HOSPITAL_COMMUNITY)
Admission: EM | Admit: 2019-04-25 | Discharge: 2019-04-26 | Disposition: A | Discharge status: Home / Self Care | Payer: PRIVATE HEALTH INSURANCE | Attending: Emergency Medicine | Admitting: Emergency Medicine

## 2019-04-25 ENCOUNTER — Encounter (HOSPITAL_COMMUNITY): Payer: Self-pay | Admitting: *Deleted

## 2019-04-25 ENCOUNTER — Other Ambulatory Visit: Payer: Self-pay

## 2019-04-25 ENCOUNTER — Emergency Department (HOSPITAL_COMMUNITY): Payer: PRIVATE HEALTH INSURANCE

## 2019-04-25 DIAGNOSIS — Z87891 Personal history of nicotine dependence: Secondary | ICD-10-CM | POA: Insufficient documentation

## 2019-04-25 DIAGNOSIS — I10 Essential (primary) hypertension: Secondary | ICD-10-CM | POA: Insufficient documentation

## 2019-04-25 DIAGNOSIS — Z79899 Other long term (current) drug therapy: Secondary | ICD-10-CM | POA: Diagnosis not present

## 2019-04-25 DIAGNOSIS — R109 Unspecified abdominal pain: Secondary | ICD-10-CM | POA: Diagnosis not present

## 2019-04-25 DIAGNOSIS — Z96643 Presence of artificial hip joint, bilateral: Secondary | ICD-10-CM | POA: Diagnosis not present

## 2019-04-25 LAB — URINALYSIS, ROUTINE W REFLEX MICROSCOPIC
Bilirubin Urine: NEGATIVE
Glucose, UA: NEGATIVE mg/dL
Hgb urine dipstick: NEGATIVE
Ketones, ur: NEGATIVE mg/dL
Leukocytes,Ua: NEGATIVE
Nitrite: NEGATIVE
Protein, ur: NEGATIVE mg/dL
Specific Gravity, Urine: 1.017 (ref 1.005–1.030)
pH: 5 (ref 5.0–8.0)

## 2019-04-25 LAB — COMPREHENSIVE METABOLIC PANEL WITH GFR
ALT: 46 U/L — ABNORMAL HIGH (ref 0–44)
AST: 37 U/L (ref 15–41)
Albumin: 3.8 g/dL (ref 3.5–5.0)
Alkaline Phosphatase: 59 U/L (ref 38–126)
Anion gap: 12 (ref 5–15)
BUN: 8 mg/dL (ref 6–20)
CO2: 25 mmol/L (ref 22–32)
Calcium: 9 mg/dL (ref 8.9–10.3)
Chloride: 102 mmol/L (ref 98–111)
Creatinine, Ser: 0.9 mg/dL (ref 0.61–1.24)
GFR calc Af Amer: >60 mL/min
GFR calc non Af Amer: >60 mL/min
Glucose, Bld: 107 mg/dL — ABNORMAL HIGH (ref 70–99)
Potassium: 3.4 mmol/L — ABNORMAL LOW (ref 3.5–5.1)
Sodium: 139 mmol/L (ref 135–145)
Total Bilirubin: 0.5 mg/dL (ref 0.3–1.2)
Total Protein: 6.6 g/dL (ref 6.5–8.1)

## 2019-04-25 LAB — CBC WITH DIFFERENTIAL/PLATELET
Abs Immature Granulocytes: 0.03 10*3/uL (ref 0.00–0.07)
Basophils Absolute: 0.1 10*3/uL (ref 0.0–0.1)
Basophils Relative: 1 %
Eosinophils Absolute: 0.4 10*3/uL (ref 0.0–0.5)
Eosinophils Relative: 4 %
HCT: 45 % (ref 39.0–52.0)
Hemoglobin: 14.9 g/dL (ref 13.0–17.0)
Immature Granulocytes: 0 %
Lymphocytes Relative: 33 %
Lymphs Abs: 2.8 10*3/uL (ref 0.7–4.0)
MCH: 33.7 pg (ref 26.0–34.0)
MCHC: 33.1 g/dL (ref 30.0–36.0)
MCV: 101.8 fL — ABNORMAL HIGH (ref 80.0–100.0)
Monocytes Absolute: 0.7 10*3/uL (ref 0.1–1.0)
Monocytes Relative: 9 %
Neutro Abs: 4.4 10*3/uL (ref 1.7–7.7)
Neutrophils Relative %: 53 %
Platelets: 279 10*3/uL (ref 150–400)
RBC: 4.42 MIL/uL (ref 4.22–5.81)
RDW: 12.3 % (ref 11.5–15.5)
WBC: 8.4 10*3/uL (ref 4.0–10.5)
nRBC: 0 % (ref 0.0–0.2)

## 2019-04-25 LAB — LIPASE, BLOOD: Lipase: 34 U/L (ref 11–51)

## 2019-04-25 MED ORDER — KETOROLAC TROMETHAMINE 30 MG/ML IJ SOLN
30.0000 mg | Freq: Once | INTRAMUSCULAR | Status: AC
  Administered 2019-04-25: 30 mg via INTRAVENOUS
  Filled 2019-04-25: qty 1

## 2019-04-25 MED ORDER — ONDANSETRON HCL 4 MG/2ML IJ SOLN
4.0000 mg | Freq: Once | INTRAMUSCULAR | Status: AC
  Administered 2019-04-25: 23:00:00 4 mg via INTRAVENOUS
  Filled 2019-04-25: qty 2

## 2019-04-25 NOTE — ED Provider Notes (Signed)
[facility] EMERGENCY DEPARTMENT Provider Note   CSN: [id number] Arrival date & time: 04/25/19  2146     History Chief Complaint  Patient presents with  . Flank Pain    Jorge Nichols is a 29 y.o. male.  HPI   Patient presents to the emergency room for evaluation of flank pain.  Patient states he started having sharp pain in the right flank today.  It comes and go and is stabbing in nature.  Has had some nausea but no emesis.  He feels like he has a recurrent kidney stone.  Patient denies fevers or chills.  No diarrhea.  No cough or shortness of breath.  Past Medical History:  Diagnosis Date  . Anxiety   . Chronic headaches   . Chronic hip pain   . Hematuria   . History of avascular necrosis of capital femoral epiphysis    bilateral hip s/p  replacement's  . Hypertension   . Mixed dyslipidemia   . Nephrolithiasis    bilateral  non-obstuctive per ct 12-25-2015  . Other hyperparathyroidism (H[facility])   . Right ureteral stone   . Vitamin D deficiency   . Weak urinary stream     Patient Active Problem List   Diagnosis Date Noted  . Mesenteric panniculitis (HCC) 03/30/2018  . Vitamin D deficiency 06/21/2015  . Other hyperparathyroidism (H[facility]) 06/05/2015  . BP (high blood pressure) 05/13/2015  . Calculus of kidney 05/13/2015  . Arthralgia of hip 12/04/2013  . Iliopsoas bursitis 12/04/2013  . History of repair of hip joint 07/03/2013  . Avascular necrosis of bone (H[facility]) 03/20/2013  . Mixed dyslipidemia 09/09/2012  . Metabolic syndrome 09/09/2012  . Morbid obesity ([facility]) 09/09/2012  . Sleep disorder breathing 09/09/2012  . Family history of premature CAD 09/09/2012    Past Surgical History:  Procedure Laterality Date  . CYSTOSCOPY WITH URETEROSCOPY, STONE BASKETRY AND STENT PLACEMENT Right 01/02/2016   Procedure: CYSTOSCOPY WITH RIGHT URETEROSCOPY, STONE EXTRACTION  AND STENT PLACEMENT;  Surgeon: Bjorn Pippin, MD;  Location: [facility];  Service: Urology;   Laterality: Right;  . EXTRACORPOREAL SHOCK WAVE LITHOTRIPSY  yrs ago  . HIP SURGERY Bilateral 2015  . HOLMIUM LASER APPLICATION Right 01/02/2016   Procedure: HOLMIUM LASER APPLICATION;  Surgeon: Bjorn Pippin, MD;  Location: [facility];  Service: Urology;  Laterality: Right;  . TOTAL HIP ARTHROPLASTY Bilateral 2015  . URETEROLITHOTOMY  x2  last one 2007       Family History  Problem Relation Age of Onset  . Hypertension Father     Social History   Tobacco Use  . Smoking status: Former Smoker    Packs/day: 0.50    Years: 15.00    Pack years: 7.50  . Smokeless tobacco: Current User    Types: Snuff  Substance Use Topics  . Alcohol use: Yes    Comment: occassionally  . Drug use: No    Home Medications Prior to Admission medications   Medication Sig Start Date End Date Taking? Authorizing Provider  metoprolol (LOPRESSOR) 50 MG tablet Take 50 mg by mouth 2 (two) times daily.   Yes [provider]  tamsulosin (FLOMAX) 0.4 MG CAPS capsule Take 1 capsule (0.4 mg total) by mouth daily. Patient not taking: Reported on 04/25/2019 02/04/19   Mesner, Barbara Cower, MD    Allergies    Patient has no known allergies.  Review of Systems   Review of Systems  All other systems reviewed and are negative.   Physical  Exam Updated Vital Signs BP (!) 150/91 (BP Location: Right Arm)   Pulse 71   Temp 97.7 F (36.5 C) (Oral)   Resp 17   Ht 1.778 m (5\' 10" )   Wt 127 kg   SpO2 98%   BMI 40.18 kg/m   Physical Exam Vitals and nursing note reviewed.  Constitutional:      General: He is not in acute distress.    Appearance: He is well-developed.  HENT:     Head: Normocephalic and atraumatic.     Right Ear: External ear normal.     Left Ear: External ear normal.  Eyes:     General: No scleral icterus.       Right eye: No discharge.        Left eye: No discharge.     Conjunctiva/sclera: Conjunctivae normal.  Neck:     Trachea: No tracheal deviation.   Cardiovascular:     Rate and Rhythm: Normal rate and regular rhythm.  Pulmonary:     Effort: Pulmonary effort is normal. No respiratory distress.     Breath sounds: Normal breath sounds. No stridor. No wheezing or rales.  Abdominal:     General: Bowel sounds are normal. There is no distension.     Palpations: Abdomen is soft.     Tenderness: There is no abdominal tenderness. There is right CVA tenderness. There is no guarding or rebound.  Musculoskeletal:        General: No tenderness.     Cervical back: Neck supple.  Skin:    General: Skin is warm and dry.     Findings: No rash.  Neurological:     Mental Status: He is alert.     Cranial Nerves: No cranial nerve deficit (no facial droop, extraocular movements intact, no slurred speech).     Sensory: No sensory deficit.     Motor: No abnormal muscle tone or seizure activity.     Coordination: Coordination normal.     ED Results / Procedures / Treatments   Labs (all labs ordered are listed, but only abnormal results are displayed) Labs Reviewed  CBC WITH DIFFERENTIAL/PLATELET - Abnormal; Notable for the following components:      Result Value   MCV 101.8 (*)    All other components within normal limits  URINALYSIS, ROUTINE W REFLEX MICROSCOPIC  COMPREHENSIVE METABOLIC PANEL  LIPASE, BLOOD    EKG None  Radiology CT Renal Stone Study  Result Date: 04/25/2019 CLINICAL DATA:  Right-sided flank pain for several hours EXAM: CT ABDOMEN AND PELVIS WITHOUT CONTRAST TECHNIQUE: Multidetector CT imaging of the abdomen and pelvis was performed following the standard protocol without IV contrast. COMPARISON:  02/04/2019 FINDINGS: Lower chest: No acute abnormality. Hepatobiliary: Fatty infiltration of the liver is noted. Gallbladder is within normal limits. Pancreas: Unremarkable. No pancreatic ductal dilatation or surrounding inflammatory changes. Spleen: Normal in size without focal abnormality. Adrenals/Urinary Tract: Adrenal glands are  within normal limits bilaterally. Kidneys again demonstrate bilateral nonobstructing renal calculi. The overall appearance is stable from the prior study. No obstructive changes are seen. The bladder is decompressed. Stomach/Bowel: Colon shows no obstructive or inflammatory changes. The appendix is within normal limits. No small bowel abnormality is noted. Stomach is within normal limits. Vascular/Lymphatic: No significant vascular findings are present. No enlarged abdominal or pelvic lymph nodes. Reproductive: Prostate is unremarkable. Other: No abdominal wall hernia or abnormality. No abdominopelvic ascites. Musculoskeletal: Bilateral hip replacements are noted. No acute bony abnormality is seen. IMPRESSION: Bilateral nonobstructing renal  calculi relatively stable from the prior exam. Fatty infiltration of the liver. No acute abnormality noted. Electronically Signed   By: Alcide Clever M.D.   On: 04/25/2019 22:55    Procedures Procedures (including critical care time)  Medications Ordered in ED Medications  ketorolac (TORADOL) 30 MG/ML injection 30 mg (30 mg Intravenous Given 04/25/19 2320)  ondansetron (ZOFRAN) injection 4 mg (4 mg Intravenous Given 04/25/19 2320)    ED Course  I have reviewed the triage vital signs and the nursing notes.  Pertinent labs & imaging results that were available during my care of the patient were reviewed by me and considered in my medical decision making (see chart for details).  Clinical Course as of Apr 24 2333  Tue Apr 25, 2019  2307 CT scan does not show any ureteral calculi.  Bilateral stable renal stones noted but do not appear to be causing any issues for him   [JK]    Clinical Course User Index [JK] Linwood Dibbles, MD   MDM Rules/Calculators/A&P                      CT scan negative for ureteral stone.  Labs pending.  Care turned over to Dr Lynelle Doctor to follow up on labs. Final Clinical Impression(s) / ED Diagnoses Final diagnoses:  Flank pain    Rx / DC  Orders ED Discharge Orders    None       Linwood Dibbles, MD 04/25/19 2335

## 2019-04-25 NOTE — ED Triage Notes (Signed)
Pt with right flank pain since this morning with nausea, denies emesis.  Pt states hx of kidney stones.

## 2019-04-26 MED ORDER — NAPROXEN 500 MG PO TABS: 500.0000 mg | ORAL_TABLET | Freq: Two times a day (BID) | ORAL | 0 refills | Status: DC

## 2019-04-26 MED ORDER — METHOCARBAMOL 500 MG PO TABS
1000.0000 mg | ORAL_TABLET | Freq: Once | ORAL | Status: AC
  Administered 2019-04-26: 1000 mg via ORAL
  Filled 2019-04-26: qty 2

## 2019-04-26 MED ORDER — CYCLOBENZAPRINE HCL 5 MG PO TABS: 5.0000 mg | ORAL_TABLET | Freq: Three times a day (TID) | ORAL | 0 refills | Status: DC | PRN

## 2019-04-26 NOTE — Discharge Instructions (Signed)
Use ice and heat as needed for comfort.  Take the medication as prescribed.  If you need to be back on your oxycodone you will need to talk to your primary care doctor.  You can also talk to Dr. Annabell Howells to see if he thinks there is anything that can be done for your kidney stones however generally there is no discomfort with kidney stones, they are only painful when you are passing the stones.

## 2019-04-26 NOTE — ED Provider Notes (Signed)
Patient left at change of shift to get results of his laboratory testing.  Patient presented today with right flank pain.  He does have renal stones but no ureteral stones.  His urinalysis does not have any hematuria although he normally has had it in the past.  12:15 AM patient states he has had no relief from the Toradol.  He denies having pain with movement however when I ask him to show me where his pain is located it obviously hurts him to try to change positions and show me where his pain is located.  He states he has had some mild nausea intermittently without vomiting.  He states he has seen Dr. Jeffie Pollock, urologist in the past.  At this point we discussed his test results and he will be treated with anti-inflammatory and a muscle relaxer.  Review the [place] shows patient was getting #60 oxycodone 7.5/325 monthly for 2 years through November and then he has not had any more.  I suspect patient is wanting narcotic pain medication. 01/27/2019  1   01/27/2019  Oxycodon-Acetaminophen 7.5-325  60.00  15 Ed Haw        Results for orders placed or performed during the hospital encounter of 04/25/19  Comprehensive metabolic panel  Result Value Ref Range   Sodium 139 135 - 145 mmol/L   Potassium 3.4 (L) 3.5 - 5.1 mmol/L   Chloride 102 98 - 111 mmol/L   CO2 25 22 - 32 mmol/L   Glucose, Bld 107 (H) 70 - 99 mg/dL   BUN 8 6 - 20 mg/dL   Creatinine, Ser 0.90 0.61 - 1.24 mg/dL   Calcium 9.0 8.9 - 10.3 mg/dL   Total Protein 6.6 6.5 - 8.1 g/dL   Albumin 3.8 3.5 - 5.0 g/dL   AST 37 15 - 41 U/L   ALT 46 (H) 0 - 44 U/L   Alkaline Phosphatase 59 38 - 126 U/L   Total Bilirubin 0.5 0.3 - 1.2 mg/dL   GFR calc non Af Amer >60 >60 mL/min   GFR calc Af Amer >60 >60 mL/min   Anion gap 12 5 - 15  Lipase, blood  Result Value Ref Range   Lipase 34 11 - 51 U/L  CBC with Diff  Result Value Ref Range   WBC 8.4 4.0 - 10.5 K/uL   RBC 4.42 4.22 - 5.81 MIL/uL   Hemoglobin 14.9 13.0 - 17.0 g/dL   HCT 45.0 39.0 - 52.0 %   MCV 101.8 (H) 80.0 - 100.0 fL   MCH 33.7 26.0 - 34.0 pg   MCHC 33.1 30.0 - 36.0 g/dL   RDW 12.3 11.5 - 15.5 %   Platelets 279 150 - 400 K/uL   nRBC 0.0 0.0 - 0.2 %   Neutrophils Relative % 53 %   Neutro Abs 4.4 1.7 - 7.7 K/uL   Lymphocytes Relative 33 %   Lymphs Abs 2.8 0.7 - 4.0 K/uL   Monocytes Relative 9 %   Monocytes Absolute 0.7 0.1 - 1.0 K/uL   Eosinophils Relative 4 %   Eosinophils Absolute 0.4 0.0 - 0.5 K/uL   Basophils Relative 1 %   Basophils Absolute 0.1 0.0 - 0.1 K/uL   Immature Granulocytes 0 %   Abs Immature Granulocytes 0.03 0.00 - 0.07 K/uL  Urinalysis, Routine w reflex microscopic  Result Value Ref Range   Color, Urine YELLOW YELLOW   APPearance CLEAR CLEAR   Specific Gravity, Urine 1.017 1.005 - 1.030   pH  5.0 5.0 - 8.0   Glucose, UA NEGATIVE NEGATIVE mg/dL   Hgb urine dipstick NEGATIVE NEGATIVE   Bilirubin Urine NEGATIVE NEGATIVE   Ketones, ur NEGATIVE NEGATIVE mg/dL   Protein, ur NEGATIVE NEGATIVE mg/dL   Nitrite NEGATIVE NEGATIVE   Leukocytes,Ua NEGATIVE NEGATIVE   Laboratory interpretation all normal except mild hypokalemia, mild macrocytosis   CT Renal Stone Study  Result Date: 04/25/2019 CLINICAL DATA:  Right-sided flank pain for several hours EXAM: CT ABDOMEN AND PELVIS WITHOUT CONTRAST TECHNIQUE: Multidetector CT imaging of the abdomen and pelvis was performed following the standard protocol without IV contrast. COMPARISON:  02/04/2019 FINDINGS: Lower chest: No acute abnormality. Hepatobiliary: Fatty infiltration of the liver is noted. Gallbladder is within normal limits. Pancreas: Unremarkable. No pancreatic ductal dilatation or surrounding inflammatory changes. Spleen: Normal in size without focal abnormality. Adrenals/Urinary Tract: Adrenal glands are within normal limits bilaterally. Kidneys again demonstrate bilateral nonobstructing renal calculi. The overall appearance is stable from the prior study. No obstructive changes  are seen. The bladder is decompressed. Stomach/Bowel: Colon shows no obstructive or inflammatory changes. The appendix is within normal limits. No small bowel abnormality is noted. Stomach is within normal limits. Vascular/Lymphatic: No significant vascular findings are present. No enlarged abdominal or pelvic lymph nodes. Reproductive: Prostate is unremarkable. Other: No abdominal wall hernia or abnormality. No abdominopelvic ascites. Musculoskeletal: Bilateral hip replacements are noted. No acute bony abnormality is seen. IMPRESSION: Bilateral nonobstructing renal calculi relatively stable from the prior exam. Fatty infiltration of the liver. No acute abnormality noted. Electronically Signed   By: Alcide Clever M.D.   On: 04/25/2019 22:55   Diagnoses that have been ruled out:  None  Diagnoses that are still under consideration:  None  Final diagnoses:  Right flank pain   Plan discharge  Devoria Albe, MD, Concha Pyo, MD 04/26/19 [phone]

## 2019-05-25 ENCOUNTER — Ambulatory Visit (INDEPENDENT_AMBULATORY_CARE_PROVIDER_SITE_OTHER): Payer: PRIVATE HEALTH INSURANCE

## 2019-05-25 ENCOUNTER — Other Ambulatory Visit: Payer: Self-pay

## 2019-05-25 ENCOUNTER — Ambulatory Visit
Admission: EM | Admit: 2019-05-25 | Discharge: 2019-05-25 | Disposition: A | Discharge status: Home / Self Care | Payer: PRIVATE HEALTH INSURANCE

## 2019-05-25 ENCOUNTER — Encounter: Payer: Self-pay | Admitting: Emergency Medicine

## 2019-05-25 DIAGNOSIS — S60222A Contusion of left hand, initial encounter: Secondary | ICD-10-CM | POA: Diagnosis not present

## 2019-05-25 DIAGNOSIS — M79642 Pain in left hand: Secondary | ICD-10-CM

## 2019-05-25 DIAGNOSIS — W208XXA Other cause of strike by thrown, projected or falling object, initial encounter: Secondary | ICD-10-CM

## 2019-05-25 MED ORDER — TRAMADOL-ACETAMINOPHEN 37.5-325 MG PO TABS: 1.0000 | ORAL_TABLET | Freq: Three times a day (TID) | ORAL | 0 refills | Status: DC | PRN

## 2019-05-25 NOTE — ED Triage Notes (Signed)
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 He states this happened at work but not workers comp.

## 2019-05-25 NOTE — Discharge Instructions (Addendum)
It was very nice seeing you today in clinic. Thank you for entrusting me with your care.   Rest, ice, and elevate hand. Wear splint for comfort.   Make arrangements to follow up with your regular doctor in 1 week for re-evaluation if not improving. If your symptoms/condition worsens, please seek follow up care either here or in the ER. Please remember, our [facility] providers are "right here with you" when you need [place].   Again, it was my pleasure to take care of you today. Thank you for choosing our clinic. I hope that you start to feel better quickly.   Quentin Mulling, MSN, APRN, FNP-C, CEN Advanced Practice Provider [facility]

## 2019-05-26 NOTE — ED Provider Notes (Signed)
[place], [place]   Name: Jorge Nichols DOB: September 09, 1990 MRN: [id number] CSN: [id number] PCP: Patient, No Pcp Per  Arrival date and time:  05/25/19 1548  Chief Complaint:  Hand Injury   NOTE: Prior to seeing the patient today, I have reviewed the triage nursing documentation and vital signs. Clinical staff has updated patient's PMH/PSHx, current medication list, and drug allergies/intolerances to ensure comprehensive history available to assist in medical decision making.   History:   HPI: Jorge Nichols is a 29 y.o. male who presents today with complaints of LEFT hand pain following an injury that occurred while at work. Injury occurred today at around 1400. Patient does not wish for this visit to be filed under worker's compensation. Injury reported to have occurred when patient what working with a "railroad jack". While working with this piece of equipment, a pin reportedly came out causing the weight to drop onto the patient's hand. Patient presents with complaints of dorsal hand pain and swelling. He notes reduced ROM due to his acute pain. Sensation and color remain normal. He denies previous hand injuries; no surgeries. In efforts to conservatively manage his symptoms at home, the patient notes that he has used some IBU, which has not helped to improve his symptoms. Patient has no other complaints today.   Past Medical History:  Diagnosis Date  . Anxiety   . Chronic headaches   . Chronic hip pain   . Hematuria   . History of avascular necrosis of capital femoral epiphysis    bilateral hip s/p  replacement's  . Hypertension   . Mixed dyslipidemia   . Nephrolithiasis    bilateral  non-obstuctive per ct 12-25-2015  . Other hyperparathyroidism (HCC)   . Right ureteral stone   . Vitamin D deficiency   . Weak urinary stream     Past Surgical History:  Procedure Laterality Date  . CYSTOSCOPY WITH URETEROSCOPY, STONE BASKETRY AND STENT PLACEMENT Right 01/02/2016   Procedure:  CYSTOSCOPY WITH RIGHT URETEROSCOPY, STONE EXTRACTION  AND STENT PLACEMENT;  Surgeon: Bjorn Pippin, MD;  Location: [facility];  Service: Urology;  Laterality: Right;  . EXTRACORPOREAL SHOCK WAVE LITHOTRIPSY  yrs ago  . HIP SURGERY Bilateral 2015  . HOLMIUM LASER APPLICATION Right 01/02/2016   Procedure: HOLMIUM LASER APPLICATION;  Surgeon: Bjorn Pippin, MD;  Location: [facility];  Service: Urology;  Laterality: Right;  . TOTAL HIP ARTHROPLASTY Bilateral 2015  . URETEROLITHOTOMY  x2  last one 2007    Family History  Problem Relation Age of Onset  . Hypertension Father     Social History   Tobacco Use  . Smoking status: Former Smoker    Packs/day: 0.50    Years: 15.00    Pack years: 7.50  . Smokeless tobacco: Current User    Types: Snuff  Substance Use Topics  . Alcohol use: Yes    Comment: occassionally  . Drug use: No    Patient Active Problem List   Diagnosis Date Noted  . Mesenteric panniculitis (HCC) 03/30/2018  . Vitamin D deficiency 06/21/2015  . Other hyperparathyroidism (HCC) 06/05/2015  . BP (high blood pressure) 05/13/2015  . Calculus of kidney 05/13/2015  . Arthralgia of hip 12/04/2013  . Iliopsoas bursitis 12/04/2013  . History of repair of hip joint 07/03/2013  . Avascular necrosis of bone (HCC) 03/20/2013  . Mixed dyslipidemia 09/09/2012  . Metabolic syndrome 09/09/2012  . Morbid obesity (HCC) 09/09/2012  . Sleep disorder breathing 09/09/2012  . Family  history of premature CAD 09/09/2012    Home Medications:    Current Meds  Medication Sig  . baclofen (LIORESAL) 10 MG tablet Take 10 mg by mouth 3 (three) times daily.  . metoprolol (LOPRESSOR) 50 MG tablet Take 50 mg by mouth 2 (two) times daily.  . tamsulosin (FLOMAX) 0.4 MG CAPS capsule Take 1 capsule (0.4 mg total) by mouth daily.    Allergies:   Patient has no known allergies.  Review of Systems (ROS):  Review of systems NEGATIVE unless otherwise noted in  narrative H&P section.   Vital Signs: Today's Vitals   05/25/19 1608 05/25/19 1610 05/25/19 1655  BP:  (!) 133/94   Pulse:  79   Resp:  18   Temp:  99 F (37.2 C)   TempSrc:  Oral   SpO2:  99%   Weight: 280 lb (127 kg)    Height: 5\' 10"  (1.778 m)    PainSc: 7   7     Physical Exam: Physical Exam  Constitutional: He is oriented to person, place, and time and well-developed, well-nourished, and in no distress.  HENT:  Head: Normocephalic and atraumatic.  Eyes: Pupils are equal, round, and reactive to light.  Cardiovascular: Normal rate and intact distal pulses.  Pulmonary/Chest: Effort normal. No respiratory distress.  Musculoskeletal:     Right hand: Swelling and tenderness present. No deformity or lacerations. Normal strength. Normal sensation.       Hands:     Comments: FROM noted, although movements are painful; able to make fist. (+) PMS with normal color, temperature, and capillary refill.   Neurological: He is alert and oriented to person, place, and time. He has normal sensation, normal strength and normal reflexes. Gait normal.  Skin: Skin is warm and dry. No rash noted. He is not diaphoretic.  Psychiatric: Mood, memory, affect and judgment normal.  Nursing note and vitals reviewed.   Urgent Care Treatments / Results:   Orders Placed This Encounter  Procedures  . DG Hand Complete Left  . Apply ACE wrap   LABS: PLEASE NOTE: all labs that were ordered this encounter are listed, however only abnormal results are displayed. Labs Reviewed - No data to display  EKG: -None  RADIOLOGY: DG Hand Complete Left  Result Date: 05/25/2019 CLINICAL DATA:  Left hand pain, railroad jack fell on hand today. Pain and swelling. EXAM: LEFT HAND - COMPLETE 3+ VIEW COMPARISON:  None. FINDINGS: There is no evidence of fracture or dislocation. There is no evidence of arthropathy or other focal bone abnormality. Mild soft tissue edema overlies the metacarpals. No soft tissue air or  radiopaque foreign body. IMPRESSION: Soft tissue edema without acute osseous abnormality. Electronically Signed   By: Keith Rake M.D.   On: 05/25/2019 16:29   PROCEDURES: Procedures  MEDICATIONS RECEIVED THIS VISIT: Medications - No data to display  PERTINENT CLINICAL COURSE NOTES/UPDATES:   Initial Impression / Assessment and Plan / Urgent Care Course:  Pertinent labs & imaging results that were available during my care of the patient were personally reviewed by me and considered in my medical decision making (see lab/imaging section of note for values and interpretations).  Jorge Nichols is a 29 y.o. male who presents to [facility] today with complaints of Hand Injury  Patient is well appearing overall in clinic today. He does not appear to be in any acute distress. Presenting symptoms (see HPI) and exam as documented above. Acuet injury to the RIGHT hand. Exam reveals  FROM. Hand with grossly neurovascularly intact. Diagnostic radiographs of the RIGHT hand revealed no acute abnormalities; no fracture, dislocation, or effusion. Based on exam and radiographs, injury most consistent with a contusion. Discussed splint use for temporary immobilization, however patient declined. He was placed in a compression wrap for comfort and added support. Will pursue treatment using anti-inflammatory (ibuprofen) medication.  He was educated on complimentary modalities to help with his pain. Patient encouraged to rest, ice, and elevate hand to help reduce pain and swelling.  Reviewed that ice should be applied TID-QID for at least 15-20 minutes at a time; written information provided on today's AVS. Will provide a short course of Ultracet for more severe pain; indications and side effects reviewed.   Discussed follow up with primary care physician in 1 week for re-evaluation. I have reviewed the follow up and strict return precautions for any new or worsening symptoms. Patient is aware of symptoms  that would be deemed urgent/emergent, and would thus require further evaluation either here or in the emergency department. At the time of discharge, he verbalized understanding and consent with the discharge plan as it was reviewed with him. All questions were fielded by provider and/or clinic staff prior to patient discharge.    Final Clinical Impressions / Urgent Care Diagnoses:   Final diagnoses:  Contusion of left hand, initial encounter  Hand pain, left    New Prescriptions:  [place] Controlled Substance Registry consulted? Yes, I have consulted the  Controlled Substances Registry for this patient, and feel the risk/benefit ratio today is favorable for proceeding with this prescription for a controlled substance.  . Discussed use of controlled substance medication to treat his acute pain.  o Reviewed  STOP Act regulations  o Clinic does not refill controlled substances over the phone without face to face evaluation.  . Safety precautions reviewed.  o Medications should not be bitten, chewed, sold, or taken with alcohol.  o Avoid use while working, driving, or operating heavy machinery.  o Side effects associated with the use of this particular medication reviewed. - Patient understands that this medication can cause CNS depression, increase his risk of falls, and even lead to overdose that may result in death, if used outside of the parameters that he and I discussed.  With all of this in mind, he knowingly accepts the risks and responsibilities associated with intended course of treatment, and elects to responsibly proceed as discussed.  Meds ordered this encounter  Medications  . traMADol-acetaminophen (ULTRACET) 37.5-325 MG tablet    Sig: Take 1 tablet by mouth every 8 (eight) hours as needed.    Dispense:  10 tablet    Refill:  0    Recommended Follow up Care:  Patient encouraged to follow up with the following provider within the specified time frame, or sooner as dictated by  the severity of his symptoms. As always, he was instructed that for any urgent/emergent care needs, he should seek care either here or in the emergency department for more immediate evaluation.  Follow-up Information    PCP In 1 week.   Why: General reassessment of symptoms if not improving        NOTE: This note was prepared using Scientist, clinical (histocompatibility and immunogenetics) along with smaller Lobbyist. Despite my best ability to proofread, there is the potential that transcriptional errors may still occur from this process, and are completely unintentional.    Verlee Monte, NP 05/26/19 [phone]

## 2019-05-27 ENCOUNTER — Encounter: Payer: Self-pay | Admitting: Emergency Medicine

## 2019-05-27 ENCOUNTER — Emergency Department: Payer: PRIVATE HEALTH INSURANCE

## 2019-05-27 ENCOUNTER — Other Ambulatory Visit: Payer: Self-pay

## 2019-05-27 ENCOUNTER — Emergency Department
Admission: EM | Admit: 2019-05-27 | Discharge: 2019-05-27 | Disposition: A | Discharge status: Home / Self Care | Payer: PRIVATE HEALTH INSURANCE | Attending: Student in an Organized Health Care Education/Training Program | Admitting: Student in an Organized Health Care Education/Training Program

## 2019-05-27 DIAGNOSIS — Z79899 Other long term (current) drug therapy: Secondary | ICD-10-CM | POA: Diagnosis not present

## 2019-05-27 DIAGNOSIS — R109 Unspecified abdominal pain: Secondary | ICD-10-CM

## 2019-05-27 DIAGNOSIS — N200 Calculus of kidney: Secondary | ICD-10-CM | POA: Insufficient documentation

## 2019-05-27 DIAGNOSIS — I10 Essential (primary) hypertension: Secondary | ICD-10-CM | POA: Insufficient documentation

## 2019-05-27 DIAGNOSIS — Z96643 Presence of artificial hip joint, bilateral: Secondary | ICD-10-CM | POA: Diagnosis not present

## 2019-05-27 DIAGNOSIS — Z87891 Personal history of nicotine dependence: Secondary | ICD-10-CM | POA: Insufficient documentation

## 2019-05-27 LAB — CBC WITH DIFFERENTIAL/PLATELET
Abs Immature Granulocytes: 0.02 10*3/uL (ref 0.00–0.07)
Basophils Absolute: 0.1 10*3/uL (ref 0.0–0.1)
Basophils Relative: 1 %
Eosinophils Absolute: 0.4 10*3/uL (ref 0.0–0.5)
Eosinophils Relative: 8 %
HCT: 43.2 % (ref 39.0–52.0)
Hemoglobin: 14.5 g/dL (ref 13.0–17.0)
Immature Granulocytes: 0 %
Lymphocytes Relative: 37 %
Lymphs Abs: 2 10*3/uL (ref 0.7–4.0)
MCH: 34 pg (ref 26.0–34.0)
MCHC: 33.6 g/dL (ref 30.0–36.0)
MCV: 101.4 fL — ABNORMAL HIGH (ref 80.0–100.0)
Monocytes Absolute: 0.6 10*3/uL (ref 0.1–1.0)
Monocytes Relative: 12 %
Neutro Abs: 2.3 10*3/uL (ref 1.7–7.7)
Neutrophils Relative %: 42 %
Platelets: 267 10*3/uL (ref 150–400)
RBC: 4.26 MIL/uL (ref 4.22–5.81)
RDW: 12.8 % (ref 11.5–15.5)
WBC: 5.5 10*3/uL (ref 4.0–10.5)
nRBC: 0 % (ref 0.0–0.2)

## 2019-05-27 LAB — URINALYSIS, COMPLETE (UACMP) WITH MICROSCOPIC
Bacteria, UA: NONE SEEN
Bilirubin Urine: NEGATIVE
Glucose, UA: NEGATIVE mg/dL
Ketones, ur: NEGATIVE mg/dL
Nitrite: NEGATIVE
Protein, ur: 30 mg/dL — AB
RBC / HPF: >50 RBC/hpf — ABNORMAL HIGH (ref 0–5)
Specific Gravity, Urine: 1.017 (ref 1.005–1.030)
pH: 6 (ref 5.0–8.0)

## 2019-05-27 LAB — COMPREHENSIVE METABOLIC PANEL
ALT: 44 U/L (ref 0–44)
AST: 45 U/L — ABNORMAL HIGH (ref 15–41)
Albumin: 3.6 g/dL (ref 3.5–5.0)
Alkaline Phosphatase: 61 U/L (ref 38–126)
Anion gap: 11 (ref 5–15)
BUN: 10 mg/dL (ref 6–20)
CO2: 24 mmol/L (ref 22–32)
Calcium: 9 mg/dL (ref 8.9–10.3)
Chloride: 105 mmol/L (ref 98–111)
Creatinine, Ser: 0.71 mg/dL (ref 0.61–1.24)
GFR calc Af Amer: >60 mL/min (ref >60)
GFR calc non Af Amer: >60 mL/min (ref >60)
Glucose, Bld: 102 mg/dL — ABNORMAL HIGH (ref 70–99)
Potassium: 4.3 mmol/L (ref 3.5–5.1)
Sodium: 140 mmol/L (ref 135–145)
Total Bilirubin: 0.4 mg/dL (ref 0.3–1.2)
Total Protein: 6.5 g/dL (ref 6.5–8.1)

## 2019-05-27 MED ORDER — ONDANSETRON HCL 4 MG/2ML IJ SOLN
4.0000 mg | Freq: Once | INTRAMUSCULAR | Status: AC
  Administered 2019-05-27: 4 mg via INTRAVENOUS
  Filled 2019-05-27: qty 2

## 2019-05-27 MED ORDER — TAMSULOSIN HCL 0.4 MG PO CAPS: 0.4000 mg | ORAL_CAPSULE | Freq: Every day | ORAL | 0 refills | Status: DC

## 2019-05-27 MED ORDER — HYDROCODONE-ACETAMINOPHEN 5-325 MG PO TABS: 1.0000 | ORAL_TABLET | ORAL | 0 refills | Status: DC | PRN

## 2019-05-27 MED ORDER — ONDANSETRON HCL 4 MG PO TABS: 4.0000 mg | ORAL_TABLET | Freq: Every day | ORAL | 0 refills | Status: DC | PRN

## 2019-05-27 MED ORDER — KETOROLAC TROMETHAMINE 30 MG/ML IJ SOLN
15.0000 mg | Freq: Once | INTRAMUSCULAR | Status: AC
  Administered 2019-05-27: 15 mg via INTRAVENOUS
  Filled 2019-05-27: qty 1

## 2019-05-27 MED ORDER — MORPHINE SULFATE (PF) 4 MG/ML IV SOLN: 4.0000 mg | INTRAVENOUS | Status: DC | PRN

## 2019-05-27 NOTE — ED Triage Notes (Signed)
Pt to ED via POV c/o left flank pain for the past few days. Pt is in NAD

## 2019-05-27 NOTE — ED Provider Notes (Signed)
[facility] Emergency Department Provider Note    First MD Initiated Contact with Patient 05/27/19 [phone]     (approximate)  I have reviewed the triage vital signs and the nursing notes.   HISTORY  Chief Complaint Flank Pain    HPI Jorge Nichols is a 29 y.o. male history of kidney stones presents to the ER with 2 days of progressively worsening left flank pain now radiating to his groin.  Is having some hematuria.  No fevers.  States the pain is moderate to severe.  Has a known history of kidney stones.  Not having any fevers.      Past Medical History:  Diagnosis Date  . Anxiety   . Chronic headaches   . Chronic hip pain   . Hematuria   . History of avascular necrosis of capital femoral epiphysis    bilateral hip s/p  replacement's  . Hypertension   . Mixed dyslipidemia   . Nephrolithiasis    bilateral  non-obstuctive per ct 12-25-2015  . Other hyperparathyroidism (HCC)   . Right ureteral stone   . Vitamin D deficiency   . Weak urinary stream    Family History  Problem Relation Age of Onset  . Hypertension Father    Past Surgical History:  Procedure Laterality Date  . CYSTOSCOPY WITH URETEROSCOPY, STONE BASKETRY AND STENT PLACEMENT Right 01/02/2016   Procedure: CYSTOSCOPY WITH RIGHT URETEROSCOPY, STONE EXTRACTION  AND STENT PLACEMENT;  Surgeon: Bjorn Pippin, MD;  Location: [facility];  Service: Urology;  Laterality: Right;  . EXTRACORPOREAL SHOCK WAVE LITHOTRIPSY  yrs ago  . HIP SURGERY Bilateral 2015  . HOLMIUM LASER APPLICATION Right 01/02/2016   Procedure: HOLMIUM LASER APPLICATION;  Surgeon: Bjorn Pippin, MD;  Location: [facility];  Service: Urology;  Laterality: Right;  . TOTAL HIP ARTHROPLASTY Bilateral 2015  . URETEROLITHOTOMY  x2  last one 2007   Patient Active Problem List   Diagnosis Date Noted  . Mesenteric panniculitis (HCC) 03/30/2018  . Vitamin D deficiency 06/21/2015  . Other  hyperparathyroidism (HCC) 06/05/2015  . BP (high blood pressure) 05/13/2015  . Calculus of kidney 05/13/2015  . Arthralgia of hip 12/04/2013  . Iliopsoas bursitis 12/04/2013  . History of repair of hip joint 07/03/2013  . Avascular necrosis of bone (HCC) 03/20/2013  . Mixed dyslipidemia 09/09/2012  . Metabolic syndrome 09/09/2012  . Morbid obesity (HCC) 09/09/2012  . Sleep disorder breathing 09/09/2012  . Family history of premature CAD 09/09/2012      Prior to Admission medications   Medication Sig Start Date End Date Taking? Authorizing Provider  baclofen (LIORESAL) 10 MG tablet Take 10 mg by mouth 3 (three) times daily.    [provider]  HYDROcodone-acetaminophen (NORCO) 5-325 MG tablet Take 1 tablet by mouth every 4 (four) hours as needed for moderate pain. 05/27/19   Willy Eddy, MD  metoprolol (LOPRESSOR) 50 MG tablet Take 50 mg by mouth 2 (two) times daily.    [provider]  ondansetron (ZOFRAN) 4 MG tablet Take 1 tablet (4 mg total) by mouth daily as needed. 05/27/19 05/26/20  Willy Eddy, MD  tamsulosin (FLOMAX) 0.4 MG CAPS capsule Take 1 capsule (0.4 mg total) by mouth daily. 02/04/19   Mesner, Barbara Cower, MD  tamsulosin (FLOMAX) 0.4 MG CAPS capsule Take 1 capsule (0.4 mg total) by mouth daily after supper. 05/27/19   Willy Eddy, MD  traMADol-acetaminophen (ULTRACET) 37.5-325 MG tablet Take 1 tablet by mouth every 8 (eight)  hours as needed. 05/25/19   Verlee Monte, NP    Allergies Patient has no known allergies.    Social History Social History   Tobacco Use  . Smoking status: Former Smoker    Packs/day: 0.50    Years: 15.00    Pack years: 7.50  . Smokeless tobacco: Current User    Types: Snuff  Substance Use Topics  . Alcohol use: Yes    Comment: occassionally  . Drug use: No    Review of Systems Patient denies headaches, rhinorrhea, blurry vision, numbness, shortness of breath, chest pain, edema, cough, abdominal pain, nausea,  vomiting, diarrhea, dysuria, fevers, rashes or hallucinations unless otherwise stated above in HPI. ____________________________________________   PHYSICAL EXAM:  VITAL SIGNS: Vitals:   05/27/19 0908  BP: 125/76  Pulse: 76  Resp: 16  Temp: 97.6 F (36.4 C)  SpO2: 97%    Constitutional: Alert and oriented.  Eyes: Conjunctivae are normal.  Head: Atraumatic. Nose: No congestion/rhinnorhea. Mouth/Throat: Mucous membranes are moist.   Neck: No stridor. Painless ROM.  Cardiovascular: Normal rate, regular rhythm. Grossly normal heart sounds.  Good peripheral circulation. Respiratory: Normal respiratory effort.  No retractions. Lungs CTAB. Gastrointestinal: Soft and nontender. No distention. No abdominal bruits. No CVA tenderness. Genitourinary:  Musculoskeletal: No lower extremity tenderness nor edema.  No joint effusions. Neurologic:  Normal speech and language. No gross focal neurologic deficits are appreciated. No facial droop Skin:  Skin is warm, dry and intact. No rash noted. Psychiatric: Mood and affect are normal. Speech and behavior are normal.  ____________________________________________   LABS (all labs ordered are listed, but only abnormal results are displayed)  Results for orders placed or performed during the hospital encounter of 05/27/19 (from the past 24 hour(s))  CBC with Differential/Platelet     Status: Abnormal   Collection Time: 05/27/19  9:58 AM  Result Value Ref Range   WBC 5.5 4.0 - 10.5 K/uL   RBC 4.26 4.22 - 5.81 MIL/uL   Hemoglobin 14.5 13.0 - 17.0 g/dL   HCT 53.9 76.7 - 34.1 %   MCV 101.4 (H) 80.0 - 100.0 fL   MCH 34.0 26.0 - 34.0 pg   MCHC 33.6 30.0 - 36.0 g/dL   RDW 93.7 90.2 - 40.9 %   Platelets 267 150 - 400 K/uL   nRBC 0.0 0.0 - 0.2 %   Neutrophils Relative % 42 %   Neutro Abs 2.3 1.7 - 7.7 K/uL   Lymphocytes Relative 37 %   Lymphs Abs 2.0 0.7 - 4.0 K/uL   Monocytes Relative 12 %   Monocytes Absolute 0.6 0.1 - 1.0 K/uL   Eosinophils  Relative 8 %   Eosinophils Absolute 0.4 0.0 - 0.5 K/uL   Basophils Relative 1 %   Basophils Absolute 0.1 0.0 - 0.1 K/uL   Immature Granulocytes 0 %   Abs Immature Granulocytes 0.02 0.00 - 0.07 K/uL  Comprehensive metabolic panel     Status: Abnormal   Collection Time: 05/27/19  9:58 AM  Result Value Ref Range   Sodium 140 135 - 145 mmol/L   Potassium 4.3 3.5 - 5.1 mmol/L   Chloride 105 98 - 111 mmol/L   CO2 24 22 - 32 mmol/L   Glucose, Bld 102 (H) 70 - 99 mg/dL   BUN 10 6 - 20 mg/dL   Creatinine, Ser 7.35 0.61 - 1.24 mg/dL   Calcium 9.0 8.9 - 32.9 mg/dL   Total Protein 6.5 6.5 - 8.1 g/dL   Albumin 3.6  3.5 - 5.0 g/dL   AST 45 (H) 15 - 41 U/L   ALT 44 0 - 44 U/L   Alkaline Phosphatase 61 38 - 126 U/L   Total Bilirubin 0.4 0.3 - 1.2 mg/dL   GFR calc non Af Amer >60 >60 mL/min   GFR calc Af Amer >60 >60 mL/min   Anion gap 11 5 - 15  Urinalysis, Complete w Microscopic     Status: Abnormal   Collection Time: 05/27/19 10:40 AM  Result Value Ref Range   Color, Urine YELLOW (A) YELLOW   APPearance CLEAR (A) CLEAR   Specific Gravity, Urine 1.017 1.005 - 1.030   pH 6.0 5.0 - 8.0   Glucose, UA NEGATIVE NEGATIVE mg/dL   Hgb urine dipstick LARGE (A) NEGATIVE   Bilirubin Urine NEGATIVE NEGATIVE   Ketones, ur NEGATIVE NEGATIVE mg/dL   Protein, ur 30 (A) NEGATIVE mg/dL   Nitrite NEGATIVE NEGATIVE   Leukocytes,Ua TRACE (A) NEGATIVE   RBC / HPF >50 (H) 0 - 5 RBC/hpf   WBC, UA 21-50 0 - 5 WBC/hpf   Bacteria, UA NONE SEEN NONE SEEN   Squamous Epithelial / LPF 0-5 0 - 5   Mucus PRESENT    ____________________________________________  ____________________________________________  RADIOLOGY  I personally reviewed all radiographic images ordered to evaluate for the above acute complaints and reviewed radiology reports and findings.  These findings were personally discussed with the patient.  Please see medical record for radiology  report.  ____________________________________________   PROCEDURES  Procedure(s) performed:  Procedures    Critical Care performed: no ____________________________________________   INITIAL IMPRESSION / ASSESSMENT AND PLAN / ED COURSE  Pertinent labs & imaging results that were available during my care of the patient were reviewed by me and considered in my medical decision making (see chart for details).   DDX: stone, cystitis, hernia, msk strain  Jorge Nichols is a 29 y.o. who presents to the ED with symptoms consistent with his known history of nephrolithiasis kidney stone.  Denies any fevers.  He is nontoxic-appearing.  Does appear uncomfortable.  Will give IV pain medication.  Will order blood work for the differential.  He had multiple CT scans all of which showing kidney stones.  Do not feel that CT imaging clinically indicated with the presentation.  Clinical Course as of May 27 1114  Sat May 27, 2019  1112 Patient states he actually just passed stone while here in the ER.  Not having any testicle pain.  Still having some mild discomfort but significantly improved.  No signs of infection or sepsis.  Has known history of kidney stones.  Patient stable and appropriate for discharge home.   [PR]    Clinical Course User Index [PR] Willy Eddy, MD    The patient was evaluated in Emergency Department today for the symptoms described in the history of present illness. He/she was evaluated in the context of the global COVID-19 pandemic, which necessitated consideration that the patient might be at risk for infection with the SARS-CoV-2 virus that causes COVID-19. Institutional protocols and algorithms that pertain to the evaluation of patients at risk for COVID-19 are in a state of rapid change based on information released by regulatory bodies including the CDC and federal and state organizations. These policies and algorithms were followed during the patient's care in the  ED.  As part of my medical decision making, I reviewed the following data within the electronic MEDICAL RECORD NUMBER Nursing notes reviewed and incorporated, Labs  reviewed, notes from prior ED visits and [place] Controlled Substance Database   ____________________________________________   FINAL CLINICAL IMPRESSION(S) / ED DIAGNOSES  Final diagnoses:  Left flank pain  Nephrolithiasis      NEW MEDICATIONS STARTED DURING THIS VISIT:  New Prescriptions   HYDROCODONE-ACETAMINOPHEN (NORCO) 5-325 MG TABLET    Take 1 tablet by mouth every 4 (four) hours as needed for moderate pain.   ONDANSETRON (ZOFRAN) 4 MG TABLET    Take 1 tablet (4 mg total) by mouth daily as needed.   TAMSULOSIN (FLOMAX) 0.4 MG CAPS CAPSULE    Take 1 capsule (0.4 mg total) by mouth daily after supper.     Note:  This document was prepared using Dragon voice recognition software and may include unintentional dictation errors.    Merlyn Lot, MD 05/27/19 1116

## 2019-05-31 ENCOUNTER — Encounter: Payer: Self-pay | Admitting: Student in an Organized Health Care Education/Training Program
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
## 2019-06-01 ENCOUNTER — Encounter: Payer: Self-pay | Admitting: Student in an Organized Health Care Education/Training Program

## 2019-06-01 ENCOUNTER — Ambulatory Visit
Payer: PRIVATE HEALTH INSURANCE | Attending: Student in an Organized Health Care Education/Training Program | Admitting: Student in an Organized Health Care Education/Training Program

## 2019-06-01 ENCOUNTER — Telehealth: Payer: Self-pay | Admitting: *Deleted

## 2019-06-01 ENCOUNTER — Other Ambulatory Visit: Payer: Self-pay

## 2019-06-01 DIAGNOSIS — M25551 Pain in right hip: Secondary | ICD-10-CM

## 2019-06-01 DIAGNOSIS — G8929 Other chronic pain: Secondary | ICD-10-CM

## 2019-06-01 DIAGNOSIS — Z96643 Presence of artificial hip joint, bilateral: Secondary | ICD-10-CM | POA: Diagnosis not present

## 2019-06-01 DIAGNOSIS — G894 Chronic pain syndrome: Secondary | ICD-10-CM

## 2019-06-01 DIAGNOSIS — M533 Sacrococcygeal disorders, not elsewhere classified: Secondary | ICD-10-CM

## 2019-06-01 DIAGNOSIS — M7072 Other bursitis of hip, left hip: Secondary | ICD-10-CM

## 2019-06-01 DIAGNOSIS — M25552 Pain in left hip: Secondary | ICD-10-CM

## 2019-06-01 DIAGNOSIS — M8700 Idiopathic aseptic necrosis of unspecified bone: Secondary | ICD-10-CM | POA: Diagnosis not present

## 2019-06-01 DIAGNOSIS — G4730 Sleep apnea, unspecified: Secondary | ICD-10-CM

## 2019-06-01 DIAGNOSIS — M7071 Other bursitis of hip, right hip: Secondary | ICD-10-CM

## 2019-06-25 ENCOUNTER — Encounter (HOSPITAL_COMMUNITY): Payer: Self-pay | Admitting: Emergency Medicine

## 2019-06-25 ENCOUNTER — Emergency Department (HOSPITAL_COMMUNITY)
Admission: EM | Admit: 2019-06-25 | Discharge: 2019-06-26 | Disposition: A | Discharge status: Home / Self Care | Payer: PRIVATE HEALTH INSURANCE | Attending: Emergency Medicine | Admitting: Emergency Medicine

## 2019-06-25 ENCOUNTER — Other Ambulatory Visit: Payer: Self-pay

## 2019-06-25 DIAGNOSIS — Z96653 Presence of artificial knee joint, bilateral: Secondary | ICD-10-CM | POA: Diagnosis not present

## 2019-06-25 DIAGNOSIS — F17210 Nicotine dependence, cigarettes, uncomplicated: Secondary | ICD-10-CM | POA: Insufficient documentation

## 2019-06-25 DIAGNOSIS — R310 Gross hematuria: Secondary | ICD-10-CM | POA: Diagnosis not present

## 2019-06-25 DIAGNOSIS — Z79899 Other long term (current) drug therapy: Secondary | ICD-10-CM | POA: Insufficient documentation

## 2019-06-25 DIAGNOSIS — I10 Essential (primary) hypertension: Secondary | ICD-10-CM | POA: Insufficient documentation

## 2019-06-25 DIAGNOSIS — N200 Calculus of kidney: Secondary | ICD-10-CM | POA: Diagnosis not present

## 2019-06-25 DIAGNOSIS — R109 Unspecified abdominal pain: Secondary | ICD-10-CM | POA: Diagnosis present

## 2019-06-25 LAB — BASIC METABOLIC PANEL
Anion gap: 9 (ref 5–15)
BUN: 9 mg/dL (ref 6–20)
CO2: 27 mmol/L (ref 22–32)
Calcium: 8.8 mg/dL — ABNORMAL LOW (ref 8.9–10.3)
Chloride: 102 mmol/L (ref 98–111)
Creatinine, Ser: 0.89 mg/dL (ref 0.61–1.24)
GFR calc Af Amer: >60 mL/min (ref >60)
GFR calc non Af Amer: >60 mL/min (ref >60)
Glucose, Bld: 105 mg/dL — ABNORMAL HIGH (ref 70–99)
Potassium: 3.8 mmol/L (ref 3.5–5.1)
Sodium: 138 mmol/L (ref 135–145)

## 2019-06-25 LAB — CBC WITH DIFFERENTIAL/PLATELET
Abs Immature Granulocytes: 0.03 10*3/uL (ref 0.00–0.07)
Basophils Absolute: 0.1 10*3/uL (ref 0.0–0.1)
Basophils Relative: 1 %
Eosinophils Absolute: 0.5 10*3/uL (ref 0.0–0.5)
Eosinophils Relative: 6 %
HCT: 45.7 % (ref 39.0–52.0)
Hemoglobin: 15 g/dL (ref 13.0–17.0)
Immature Granulocytes: 0 %
Lymphocytes Relative: 29 %
Lymphs Abs: 2.5 10*3/uL (ref 0.7–4.0)
MCH: 34.1 pg — ABNORMAL HIGH (ref 26.0–34.0)
MCHC: 32.8 g/dL (ref 30.0–36.0)
MCV: 103.9 fL — ABNORMAL HIGH (ref 80.0–100.0)
Monocytes Absolute: 0.8 10*3/uL (ref 0.1–1.0)
Monocytes Relative: 9 %
Neutro Abs: 4.7 10*3/uL (ref 1.7–7.7)
Neutrophils Relative %: 55 %
Platelets: 296 10*3/uL (ref 150–400)
RBC: 4.4 MIL/uL (ref 4.22–5.81)
RDW: 13 % (ref 11.5–15.5)
WBC: 8.6 10*3/uL (ref 4.0–10.5)
nRBC: 0 % (ref 0.0–0.2)

## 2019-06-25 MED ORDER — ONDANSETRON HCL 4 MG/2ML IJ SOLN
4.0000 mg | Freq: Once | INTRAMUSCULAR | Status: AC
  Administered 2019-06-25: 23:00:00 4 mg via INTRAVENOUS
  Filled 2019-06-25: qty 2

## 2019-06-25 MED ORDER — HYDROMORPHONE HCL 1 MG/ML IJ SOLN
1.0000 mg | Freq: Once | INTRAMUSCULAR | Status: AC
  Administered 2019-06-25: 1 mg via INTRAVENOUS
  Filled 2019-06-25: qty 1

## 2019-06-25 NOTE — ED Triage Notes (Signed)
Patient complaining of flank pain with blood in his urine. Patient has hx of kidney stones and states that he just passed a kidney stone last week. Patient states pain started this morning.

## 2019-06-26 LAB — URINALYSIS, ROUTINE W REFLEX MICROSCOPIC
Bacteria, UA: NONE SEEN
Bilirubin Urine: NEGATIVE
Glucose, UA: NEGATIVE mg/dL
Ketones, ur: NEGATIVE mg/dL
Leukocytes,Ua: NEGATIVE
Nitrite: NEGATIVE
Protein, ur: 100 mg/dL — AB
RBC / HPF: >50 RBC/hpf — ABNORMAL HIGH (ref 0–5)
Specific Gravity, Urine: 1.023 (ref 1.005–1.030)
pH: 6 (ref 5.0–8.0)

## 2019-06-26 MED ORDER — HYDROCODONE-ACETAMINOPHEN 5-325 MG PO TABS
1.0000 | ORAL_TABLET | Freq: Once | ORAL | Status: AC
  Administered 2019-06-26: 1 via ORAL
  Filled 2019-06-26: qty 1

## 2019-06-26 MED ORDER — HYDROCODONE-ACETAMINOPHEN 5-325 MG PO TABS: 1.0000 | ORAL_TABLET | ORAL | 0 refills | Status: DC | PRN

## 2019-06-26 MED ORDER — KETOROLAC TROMETHAMINE 30 MG/ML IJ SOLN
15.0000 mg | Freq: Once | INTRAMUSCULAR | Status: AC
  Administered 2019-06-26: 01:00:00 15 mg via INTRAVENOUS
  Filled 2019-06-26: qty 1

## 2019-06-26 NOTE — ED Notes (Signed)
Performed bladder scan on patient. Fluid within bladder was 12 ml's.

## 2019-06-26 NOTE — Discharge Instructions (Signed)
Strain your urine so you will know when the stone passes.  You may take the pain medication prescribed, do not drive within 4 hours of taking hydrocodone as this will make you drowsy.  Plan to call Dr. Annabell Howells for follow-up care.  Get rechecked immediately for any worsened symptoms including uncontrolled vomiting, fevers or worsening pain.

## 2019-06-26 NOTE — ED Provider Notes (Signed)
[facility] EMERGENCY DEPARTMENT Provider Note   CSN: [id number] Arrival date & time: 06/25/19  2201     History Chief Complaint  Patient presents with  . Flank Pain    Jorge Nichols is a 29 y.o. male with a history of chronic nephrolithiasis with frequent passage of stones, developed increased pain this morning, right-sided flank which has since moved into his right lower pelvis region with significantly bloody urine, more so than with the typical kidney stone passage.  He denies fevers or chills, he does have nausea when pain is severe, no emesis.  Pain has been intermittent.  He reports increased urinary frequency with passage of small amounts of urine with dysuria.  He denies suprapubic pain or distention. His urologist is Dr. Annabell Howells, states he last saw him about a year and a half ago, stating he typically can pass his stones without intervention.  He was last seen 1 month ago at [facility] and renal ultrasound obtained at that visit showed a 4 mm right renal stone.   He takes Flomax daily due to the frequency of kidney stone production.  He has had no pain medications prior to arrival.  HPI     Past Medical History:  Diagnosis Date  . Anxiety   . Chronic headaches   . Chronic hip pain   . Hematuria   . History of avascular necrosis of capital femoral epiphysis    bilateral hip s/p  replacement's  . Hypertension   . Mixed dyslipidemia   . Nephrolithiasis    bilateral  non-obstuctive per ct 12-25-2015  . Other hyperparathyroidism (HCC)   . Right ureteral stone   . Vitamin D deficiency   . Weak urinary stream     Patient Active Problem List   Diagnosis Date Noted  . Chronic pain syndrome 06/01/2019  . Mesenteric panniculitis (HCC) 03/30/2018  . Vitamin D deficiency 06/21/2015  . Other hyperparathyroidism (HCC) 06/05/2015  . BP (high blood pressure) 05/13/2015  . Calculus of kidney 05/13/2015  . Arthralgia of hip 12/04/2013  . Iliopsoas bursitis 12/04/2013  .  History of total replacement of both hip joints 07/03/2013  . Avascular necrosis of bone (HCC) 03/20/2013  . Mixed dyslipidemia 09/09/2012  . Metabolic syndrome 09/09/2012  . Morbid obesity (HCC) 09/09/2012  . Sleep disorder breathing 09/09/2012  . Family history of premature CAD 09/09/2012    Past Surgical History:  Procedure Laterality Date  . CYSTOSCOPY WITH URETEROSCOPY, STONE BASKETRY AND STENT PLACEMENT Right 01/02/2016   Procedure: CYSTOSCOPY WITH RIGHT URETEROSCOPY, STONE EXTRACTION  AND STENT PLACEMENT;  Surgeon: Bjorn Pippin, MD;  Location: [facility];  Service: Urology;  Laterality: Right;  . EXTRACORPOREAL SHOCK WAVE LITHOTRIPSY  yrs ago  . HIP SURGERY Bilateral 2015  . HOLMIUM LASER APPLICATION Right 01/02/2016   Procedure: HOLMIUM LASER APPLICATION;  Surgeon: Bjorn Pippin, MD;  Location: [facility];  Service: Urology;  Laterality: Right;  . TOTAL HIP ARTHROPLASTY Bilateral 2015  . URETEROLITHOTOMY  x2  last one 2007       Family History  Problem Relation Age of Onset  . Hypertension Father     Social History   Tobacco Use  . Smoking status: Current Every Day Smoker    Packs/day: 0.50    Years: 15.00    Pack years: 7.50  . Smokeless tobacco: Current User    Types: Snuff  Substance Use Topics  . Alcohol use: Yes    Comment: occassionally  . Drug use:  No    Home Medications Prior to Admission medications   Medication Sig Start Date End Date Taking? Authorizing Provider  allopurinol (ZYLOPRIM) 100 MG tablet Take 1 tablet by mouth 2 (two) times daily. 06/06/19  Yes [provider]  baclofen (LIORESAL) 10 MG tablet Take 10 mg by mouth 3 (three) times daily.   Yes [provider]  meloxicam (MOBIC) 7.5 MG tablet Take 7.5 mg by mouth daily. 06/14/19  Yes [provider]  metoprolol (LOPRESSOR) 50 MG tablet Take 50 mg by mouth 2 (two) times daily.   Yes [provider]  tamsulosin (FLOMAX) 0.4 MG CAPS  capsule Take 1 capsule (0.4 mg total) by mouth daily. 02/04/19  Yes Mesner, Corene Cornea, MD  HYDROcodone-acetaminophen (NORCO/VICODIN) 5-325 MG tablet Take 1 tablet by mouth every 4 (four) hours as needed. 06/26/19   Evalee Jefferson, PA-C    Allergies    Patient has no known allergies.  Review of Systems   Review of Systems  Constitutional: Negative for chills and fever.  HENT: Negative.   Eyes: Negative.   Respiratory: Negative.   Cardiovascular: Negative.   Gastrointestinal: Positive for abdominal pain and nausea. Negative for vomiting.  Genitourinary: Positive for dysuria, flank pain, frequency and hematuria. Negative for discharge and scrotal swelling.  Musculoskeletal: Negative for arthralgias.  Skin: Negative.  Negative for rash and wound.  Neurological: Negative.   Psychiatric/Behavioral: Negative.     Physical Exam Updated Vital Signs BP (!) 145/101   Pulse 86   Temp 98 F (36.7 C) (Oral)   Resp 18   Ht 5\' 10"  (1.778 m)   Wt 131.5 kg   SpO2 97%   BMI 41.61 kg/m   Physical Exam Vitals and nursing note reviewed.  Constitutional:      Appearance: He is well-developed.  HENT:     Head: Normocephalic and atraumatic.  Eyes:     Conjunctiva/sclera: Conjunctivae normal.  Cardiovascular:     Rate and Rhythm: Normal rate and regular rhythm.     Heart sounds: Normal heart sounds.  Pulmonary:     Effort: Pulmonary effort is normal.     Breath sounds: Normal breath sounds. No wheezing.  Abdominal:     General: Bowel sounds are normal. There is no distension.     Palpations: Abdomen is soft.     Tenderness: There is no abdominal tenderness. There is no guarding.  Musculoskeletal:        General: Normal range of motion.     Cervical back: Normal range of motion.  Skin:    General: Skin is warm and dry.  Neurological:     Mental Status: He is alert.     ED Results / Procedures / Treatments   Labs (all labs ordered are listed, but only abnormal results are displayed) Labs  Reviewed  URINALYSIS, ROUTINE W REFLEX MICROSCOPIC - Abnormal; Notable for the following components:      Result Value   Color, Urine RED (*)    APPearance HAZY (*)    Hgb urine dipstick LARGE (*)    Protein, ur 100 (*)    RBC / HPF >50 (*)    All other components within normal limits  BASIC METABOLIC PANEL - Abnormal; Notable for the following components:   Glucose, Bld 105 (*)    Calcium 8.8 (*)    All other components within normal limits  CBC WITH DIFFERENTIAL/PLATELET - Abnormal; Notable for the following components:   MCV 103.9 (*)    MCH 34.1 (*)  All other components within normal limits    EKG None  Radiology No results found.  Procedures Procedures (including critical care time)  Medications Ordered in ED Medications  HYDROcodone-acetaminophen (NORCO/VICODIN) 5-325 MG per tablet 1 tablet (has no administration in time range)  HYDROmorphone (DILAUDID) injection 1 mg (1 mg Intravenous Given 06/25/19 2316)  ondansetron (ZOFRAN) injection 4 mg (4 mg Intravenous Given 06/25/19 2316)  ketorolac (TORADOL) 30 MG/ML injection 15 mg (15 mg Intravenous Given 06/26/19 0032)    ED Course  I have reviewed the triage vital signs and the nursing notes.  Pertinent labs & imaging results that were available during my care of the patient were reviewed by me and considered in my medical decision making (see chart for details).    MDM Rules/Calculators/A&P                      Patient's labs reviewed, he has no UTI but does have significant hematuria.  Recent imaging including an ultrasound obtained 1 month ago and a CT renal study obtained 2 months ago were also reviewed.  Imaging was not felt needed today given results of these 2 recent imaging studies.  Patient was encouraged close follow-up with Dr. Annabell Howells if the stone does not pass as expected or he has any new or worsening symptoms.  He was given a urine strainer and prescribed a small quantity of pain medication.  He will continue  his Flomax medication.  Return precautions were outlined including fevers, worse pain uncontrolled vomiting. Final Clinical Impression(s) / ED Diagnoses Final diagnoses:  Kidney stone  Gross hematuria    Rx / DC Orders ED Discharge Orders         Ordered    HYDROcodone-acetaminophen (NORCO/VICODIN) 5-325 MG tablet  Every 4 hours PRN     06/26/19 0144           Burgess Amor, PA-C 06/26/19 0148    Devoria Albe, MD 06/26/19 [phone]

## 2019-12-18 ENCOUNTER — Emergency Department (HOSPITAL_COMMUNITY)
Admission: EM | Admit: 2019-12-18 | Discharge: 2019-12-18 | Disposition: A | Discharge status: Home / Self Care | Payer: Self-pay | Attending: Emergency Medicine | Admitting: Emergency Medicine

## 2019-12-18 ENCOUNTER — Other Ambulatory Visit: Payer: Self-pay

## 2019-12-18 ENCOUNTER — Encounter (HOSPITAL_COMMUNITY): Payer: Self-pay | Admitting: Emergency Medicine

## 2019-12-18 ENCOUNTER — Emergency Department (HOSPITAL_COMMUNITY): Payer: Self-pay

## 2019-12-18 DIAGNOSIS — F17210 Nicotine dependence, cigarettes, uncomplicated: Secondary | ICD-10-CM | POA: Insufficient documentation

## 2019-12-18 DIAGNOSIS — Z79899 Other long term (current) drug therapy: Secondary | ICD-10-CM | POA: Insufficient documentation

## 2019-12-18 DIAGNOSIS — N200 Calculus of kidney: Secondary | ICD-10-CM | POA: Insufficient documentation

## 2019-12-18 DIAGNOSIS — I10 Essential (primary) hypertension: Secondary | ICD-10-CM | POA: Insufficient documentation

## 2019-12-18 LAB — URINALYSIS, ROUTINE W REFLEX MICROSCOPIC
Bilirubin Urine: NEGATIVE
Glucose, UA: NEGATIVE mg/dL
Ketones, ur: NEGATIVE mg/dL
Leukocytes,Ua: NEGATIVE
Nitrite: NEGATIVE
Protein, ur: 30 mg/dL — AB
RBC / HPF: >50 RBC/hpf — ABNORMAL HIGH (ref 0–5)
Specific Gravity, Urine: 1.024 (ref 1.005–1.030)
pH: 6 (ref 5.0–8.0)

## 2019-12-18 LAB — BASIC METABOLIC PANEL
Anion gap: 10 (ref 5–15)
BUN: 8 mg/dL (ref 6–20)
CO2: 26 mmol/L (ref 22–32)
Calcium: 9.3 mg/dL (ref 8.9–10.3)
Chloride: 102 mmol/L (ref 98–111)
Creatinine, Ser: 0.77 mg/dL (ref 0.61–1.24)
GFR calc Af Amer: >60 mL/min (ref >60)
GFR calc non Af Amer: >60 mL/min (ref >60)
Glucose, Bld: 99 mg/dL (ref 70–99)
Potassium: 3.7 mmol/L (ref 3.5–5.1)
Sodium: 138 mmol/L (ref 135–145)

## 2019-12-18 LAB — CBC
HCT: 48.9 % (ref 39.0–52.0)
Hemoglobin: 16.5 g/dL (ref 13.0–17.0)
MCH: 34.4 pg — ABNORMAL HIGH (ref 26.0–34.0)
MCHC: 33.7 g/dL (ref 30.0–36.0)
MCV: 101.9 fL — ABNORMAL HIGH (ref 80.0–100.0)
Platelets: 280 10*3/uL (ref 150–400)
RBC: 4.8 MIL/uL (ref 4.22–5.81)
RDW: 12.3 % (ref 11.5–15.5)
WBC: 11.1 10*3/uL — ABNORMAL HIGH (ref 4.0–10.5)
nRBC: 0 % (ref 0.0–0.2)

## 2019-12-18 MED ORDER — ONDANSETRON 4 MG PO TBDP: 4.0000 mg | ORAL_TABLET | Freq: Three times a day (TID) | ORAL | 0 refills | Status: DC | PRN

## 2019-12-18 MED ORDER — KETOROLAC TROMETHAMINE 30 MG/ML IJ SOLN
30.0000 mg | Freq: Once | INTRAMUSCULAR | Status: AC
  Administered 2019-12-18: 30 mg via INTRAVENOUS
  Filled 2019-12-18: qty 1

## 2019-12-18 MED ORDER — HYDROMORPHONE HCL 1 MG/ML IJ SOLN
1.0000 mg | Freq: Once | INTRAMUSCULAR | Status: AC
  Administered 2019-12-18: 1 mg via INTRAVENOUS
  Filled 2019-12-18: qty 1

## 2019-12-18 MED ORDER — ONDANSETRON HCL 4 MG/2ML IJ SOLN
4.0000 mg | Freq: Once | INTRAMUSCULAR | Status: AC
  Administered 2019-12-18: 4 mg via INTRAVENOUS
  Filled 2019-12-18: qty 2

## 2019-12-18 MED ORDER — MORPHINE SULFATE (PF) 4 MG/ML IV SOLN
4.0000 mg | Freq: Once | INTRAVENOUS | Status: AC
  Administered 2019-12-18: 4 mg via INTRAVENOUS
  Filled 2019-12-18: qty 1

## 2019-12-18 MED ORDER — OXYCODONE-ACETAMINOPHEN 5-325 MG PO TABS: 1.0000 | ORAL_TABLET | Freq: Three times a day (TID) | ORAL | 0 refills | Status: DC | PRN

## 2019-12-18 NOTE — ED Triage Notes (Signed)
Pt reports right flank pain x2 days. Pt reports history of kidney stones and reports pain is now in bladder. Pt reports decreased urine output with hematuria.

## 2019-12-18 NOTE — Discharge Instructions (Addendum)
You are seen today for kidney stones, make sure you stay hydrated.  You can use ibuprofen as prescribed on bottle for these, when the pain is severe you can take Percocet as we spoke about.  You can pick these up in the morning.  In regards to your nausea you can take the Zofran as needed as prescribed.  Please follow-up with your urologist, Dr. Annabell Howells.  Please use the attached instructions.  If symptoms become worse please come back to the emergency department.

## 2019-12-18 NOTE — ED Provider Notes (Signed)
[facility] EMERGENCY DEPARTMENT Provider Note   CSN: [id number] Arrival date & time: 12/18/19  1504     History Chief Complaint  Patient presents with  . Hematuria    Jorge Nichols is a 29 y.o. male with a history of chronic nephrolithiasis with frequent passage of stones that presents the emergency department today for worsening right-sided flank pain.  Patient states that flank pain that started 3 days ago, has now moved into his right lower pelvic region.  Has noticed he is also had some bloody urine and difficulty urinating.  Does feel nauseous currently, no emesis.  Patient states that he has had kidney stones since he was 77, has seen multiple urologists including Dr. Annabell Howells who he saw about 2 years ago.  Normally can pass the stone without intervention, however has had to obtain surgical intervention as well, states that this is occurred about 3 times in his life.  States that he had some Percocet left over from last kidney stone, has been taking them the past 3 days however ran out today and pain is now severe.  Denies any fevers, chills, abdominal pain, chance of STDs, penile pain, scrotal swelling, scrotal pain, scrotal masses, diarrhea, rectal pain.  Denies any abdominal pain, no previous history of abdominal surgery.  Pain is constant, no relieving factors besides Percocet. Worsening factors depend on position.  HPI     Past Medical History:  Diagnosis Date  . Anxiety   . Chronic headaches   . Chronic hip pain   . Hematuria   . History of avascular necrosis of capital femoral epiphysis    bilateral hip s/p  replacement's  . Hypertension   . Mixed dyslipidemia   . Nephrolithiasis    bilateral  non-obstuctive per ct 12-25-2015  . Other hyperparathyroidism (HCC)   . Right ureteral stone   . Vitamin D deficiency   . Weak urinary stream     Patient Active Problem List   Diagnosis Date Noted  . Chronic pain syndrome 06/01/2019  . Mesenteric panniculitis (HCC)  03/30/2018  . Vitamin D deficiency 06/21/2015  . Other hyperparathyroidism (HCC) 06/05/2015  . BP (high blood pressure) 05/13/2015  . Calculus of kidney 05/13/2015  . Arthralgia of hip 12/04/2013  . Iliopsoas bursitis 12/04/2013  . History of total replacement of both hip joints 07/03/2013  . Avascular necrosis of bone (HCC) 03/20/2013  . Mixed dyslipidemia 09/09/2012  . Metabolic syndrome 09/09/2012  . Morbid obesity (HCC) 09/09/2012  . Sleep disorder breathing 09/09/2012  . Family history of premature CAD 09/09/2012    Past Surgical History:  Procedure Laterality Date  . CYSTOSCOPY WITH URETEROSCOPY, STONE BASKETRY AND STENT PLACEMENT Right 01/02/2016   Procedure: CYSTOSCOPY WITH RIGHT URETEROSCOPY, STONE EXTRACTION  AND STENT PLACEMENT;  Surgeon: Bjorn Pippin, MD;  Location: [facility];  Service: Urology;  Laterality: Right;  . EXTRACORPOREAL SHOCK WAVE LITHOTRIPSY  yrs ago  . HIP SURGERY Bilateral 2015  . HOLMIUM LASER APPLICATION Right 01/02/2016   Procedure: HOLMIUM LASER APPLICATION;  Surgeon: Bjorn Pippin, MD;  Location: [facility];  Service: Urology;  Laterality: Right;  . TOTAL HIP ARTHROPLASTY Bilateral 2015  . URETEROLITHOTOMY  x2  last one 2007       Family History  Problem Relation Age of Onset  . Hypertension Father     Social History   Tobacco Use  . Smoking status: Current Every Day Smoker    Packs/day: 0.50    Years: 15.00  Pack years: 7.50  . Smokeless tobacco: Current User    Types: Snuff  Substance Use Topics  . Alcohol use: Yes    Comment: occassionally  . Drug use: No    Home Medications Prior to Admission medications   Medication Sig Start Date End Date Taking? Authorizing Provider  allopurinol (ZYLOPRIM) 100 MG tablet Take 1 tablet by mouth 2 (two) times daily. 06/06/19   [provider]  baclofen (LIORESAL) 10 MG tablet Take 10 mg by mouth 3 (three) times daily.    [provider]   meloxicam (MOBIC) 7.5 MG tablet Take 7.5 mg by mouth daily. 06/14/19   [provider]  metoprolol (LOPRESSOR) 50 MG tablet Take 50 mg by mouth 2 (two) times daily.    [provider]  ondansetron (ZOFRAN ODT) 4 MG disintegrating tablet Take 1 tablet (4 mg total) by mouth every 8 (eight) hours as needed for nausea or vomiting. 12/18/19   Farrel Gordon, PA-C  oxyCODONE-acetaminophen (PERCOCET/ROXICET) 5-325 MG tablet Take 1 tablet by mouth every 8 (eight) hours as needed for severe pain. 12/18/19   Farrel Gordon, PA-C  tamsulosin (FLOMAX) 0.4 MG CAPS capsule Take 1 capsule (0.4 mg total) by mouth daily. 02/04/19   Mesner, Barbara Cower, MD    Allergies    Patient has no known allergies.  Review of Systems   Review of Systems  Constitutional: Negative for chills, diaphoresis, fatigue and fever.  HENT: Negative for congestion, sore throat and trouble swallowing.   Eyes: Negative for pain and visual disturbance.  Respiratory: Negative for cough, shortness of breath and wheezing.   Cardiovascular: Negative for chest pain, palpitations and leg swelling.  Gastrointestinal: Negative for abdominal distention, abdominal pain, diarrhea, nausea and vomiting.  Genitourinary: Positive for difficulty urinating, flank pain and hematuria. Negative for decreased urine volume, discharge, dysuria, penile pain, penile swelling, scrotal swelling, testicular pain and urgency.  Musculoskeletal: Negative for back pain, neck pain and neck stiffness.  Skin: Negative for pallor.  Neurological: Negative for dizziness, speech difficulty, weakness and headaches.  Psychiatric/Behavioral: Negative for confusion.    Physical Exam Updated Vital Signs BP 138/83 (BP Location: Right Arm)   Pulse 79   Temp 97.8 F (36.6 C) (Oral)   Resp 17   Ht 5\' 9"  (1.753 m)   Wt 127 kg   SpO2 100%   BMI 41.35 kg/m   Physical Exam Exam conducted with a chaperone present.  Constitutional:      General: He is not in acute  distress.    Appearance: Normal appearance. He is not ill-appearing, toxic-appearing or diaphoretic.  HENT:     Mouth/Throat:     Mouth: Mucous membranes are moist.     Pharynx: Oropharynx is clear.  Eyes:     General: No scleral icterus.    Extraocular Movements: Extraocular movements intact.     Pupils: Pupils are equal, round, and reactive to light.  Cardiovascular:     Rate and Rhythm: Normal rate and regular rhythm.     Pulses: Normal pulses.     Heart sounds: Normal heart sounds.  Pulmonary:     Effort: Pulmonary effort is normal. No respiratory distress.     Breath sounds: Normal breath sounds. No stridor. No wheezing, rhonchi or rales.  Chest:     Chest wall: No tenderness.  Abdominal:     General: Abdomen is flat. There is no distension.     Palpations: Abdomen is soft.     Tenderness: There is no abdominal  tenderness. There is no right CVA tenderness, left CVA tenderness, guarding or rebound.     Hernia: There is no hernia in the left inguinal area or right inguinal area.     Comments: Tenderness to palpation to right flank, no ecchymosis or erythema or warmth noted.  Genitourinary:    Penis: Uncircumcised.      Testes: Normal. Cremasteric reflex is present.        Right: Mass, tenderness or swelling not present.        Left: Mass, tenderness or swelling not present.     Epididymis:     Right: Normal.     Left: Normal.     Comments: Chaperone present.  Penis normal.  No tenderness palpation.  Testes without any mass, tenderness, warmth or erythema.  No hernias present.  No adenopathy present. Musculoskeletal:        General: No swelling or tenderness. Normal range of motion.     Cervical back: Normal range of motion and neck supple. No rigidity.     Right lower leg: No edema.     Left lower leg: No edema.  Lymphadenopathy:     Lower Body: No right inguinal adenopathy. No left inguinal adenopathy.  Skin:    General: Skin is warm and dry.     Capillary Refill:  Capillary refill takes less than 2 seconds.     Coloration: Skin is not pale.  Neurological:     General: No focal deficit present.     Mental Status: He is alert and oriented to person, place, and time.  Psychiatric:        Mood and Affect: Mood normal.        Behavior: Behavior normal.     ED Results / Procedures / Treatments   Labs (all labs ordered are listed, but only abnormal results are displayed) Labs Reviewed  URINALYSIS, ROUTINE W REFLEX MICROSCOPIC - Abnormal; Notable for the following components:      Result Value   Color, Urine AMBER (*)    APPearance HAZY (*)    Hgb urine dipstick LARGE (*)    Protein, ur 30 (*)    RBC / HPF >50 (*)    Bacteria, UA RARE (*)    All other components within normal limits  CBC - Abnormal; Notable for the following components:   WBC 11.1 (*)    MCV 101.9 (*)    MCH 34.4 (*)    All other components within normal limits  BASIC METABOLIC PANEL    EKG None  Radiology CT Renal Stone Study  Result Date: 12/18/2019 CLINICAL DATA:  Acute right flank pain. EXAM: CT ABDOMEN AND PELVIS WITHOUT CONTRAST TECHNIQUE: Multidetector CT imaging of the abdomen and pelvis was performed following the standard protocol without IV contrast. COMPARISON:  April 25, 2019. FINDINGS: Lower chest: No acute abnormality. Hepatobiliary: No gallstones or biliary dilatation is noted. Hepatic steatosis is noted. Pancreas: Unremarkable. No pancreatic ductal dilatation or surrounding inflammatory changes. Spleen: Normal in size without focal abnormality. Adrenals/Urinary Tract: Adrenal glands appear normal. Bilateral nonobstructive nephrolithiasis is noted. No hydronephrosis or renal obstruction is noted. Urinary bladder is not well visualized due to scatter artifact arising from bilateral hip prostheses. Stomach/Bowel: Stomach is within normal limits. Appendix appears normal. No evidence of bowel wall thickening, distention, or inflammatory changes. Vascular/Lymphatic:  No significant vascular findings are present. No enlarged abdominal or pelvic lymph nodes. Reproductive: Prostate not well visualized due to scatter artifact arising from bilateral hip prostheses. Other: No  abdominal wall hernia or abnormality. No abdominopelvic ascites. Musculoskeletal: Bilateral hip arthroplasties as noted before. No acute abnormality is noted. IMPRESSION: 1. Hepatic steatosis. 2. Bilateral nonobstructive nephrolithiasis. No hydronephrosis or renal obstruction is noted. 3. Urinary bladder is not well visualized due to scatter artifact arising from bilateral hip prostheses. 4. No other abnormality seen in the abdomen or pelvis. Electronically Signed   By: Lupita Raider M.D.   On: 12/18/2019 19:56    Procedures Procedures (including critical care time)  Medications Ordered in ED Medications  HYDROmorphone (DILAUDID) injection 1 mg (1 mg Intravenous Given 12/18/19 1916)  ondansetron (ZOFRAN) injection 4 mg (4 mg Intravenous Given 12/18/19 1915)  morphine 4 MG/ML injection 4 mg (4 mg Intravenous Given 12/18/19 2039)  ketorolac (TORADOL) 30 MG/ML injection 30 mg (30 mg Intravenous Given 12/18/19 2039)    ED Course  I have reviewed the triage vital signs and the nursing notes.  Pertinent labs & imaging results that were available during my care of the patient were reviewed by me and considered in my medical decision making (see chart for details).    MDM Rules/Calculators/A&P                           MATAEO INGWERSEN is a 29 y.o. male with a history of chronic nephrolithiasis with frequent passage of stones that presents the emergency department today for worsening right-sided flank pain.  Patient does have history of kidney stone, CT renal ordered.  Initial interventions include Dilaudid and Zofran.  Work-up today with normal BMP, no kidney dysfunction, small leukocytosis of 11.1.  Urinalysis does not show signs of infection.  CT renal shows bilateral nephrolithiasis without  hydronephrosis, unable to see bladder due to bilateral hip prosthesis.  Did speak to patient about this, most likely does have kidney stone in this area since patient states that this normally occurs where the stone gets down into his bladder and we cannot see it on CT due to his prosthesis.  Patient denied STD testing.  Patient still complaining of pain however has improved, will give morphine and Toradol at this time.  Upon reassessment patient states that he feels much better, will give Percocet and Zofran outpatient, patient follow-up with Dr. Annabell Howells his urologist.  Patient agreeable for this, patient be discharged.  Doubt need for further emergent work up at this time. I explained the diagnosis and have given explicit precautions to return to the ER including for any other new or worsening symptoms. The patient understands and accepts the medical plan as it's been dictated and I have answered their questions. Discharge instructions concerning home care and prescriptions have been given. The patient is STABLE and is discharged to home in good condition.  .     Final Clinical Impression(s) / ED Diagnoses Final diagnoses:  Nephrolithiasis    Rx / DC Orders ED Discharge Orders         Ordered    oxyCODONE-acetaminophen (PERCOCET/ROXICET) 5-325 MG tablet  Every 8 hours PRN        12/18/19 2057    ondansetron (ZOFRAN ODT) 4 MG disintegrating tablet  Every 8 hours PRN        12/18/19 2057           Farrel Gordon, PA-C 12/18/19 2107    Gerhard Munch, MD 12/18/19 2308

## 2020-03-20 ENCOUNTER — Emergency Department (HOSPITAL_COMMUNITY)
Admission: EM | Admit: 2020-03-20 | Discharge: 2020-03-20 | Disposition: A | Discharge status: Home / Self Care | Payer: Self-pay | Attending: Emergency Medicine | Admitting: Emergency Medicine

## 2020-03-20 ENCOUNTER — Emergency Department (HOSPITAL_COMMUNITY): Payer: Self-pay

## 2020-03-20 ENCOUNTER — Encounter (HOSPITAL_COMMUNITY): Payer: Self-pay

## 2020-03-20 ENCOUNTER — Other Ambulatory Visit: Payer: Self-pay

## 2020-03-20 DIAGNOSIS — Z96643 Presence of artificial hip joint, bilateral: Secondary | ICD-10-CM | POA: Insufficient documentation

## 2020-03-20 DIAGNOSIS — Z79899 Other long term (current) drug therapy: Secondary | ICD-10-CM | POA: Insufficient documentation

## 2020-03-20 DIAGNOSIS — F17200 Nicotine dependence, unspecified, uncomplicated: Secondary | ICD-10-CM | POA: Insufficient documentation

## 2020-03-20 DIAGNOSIS — I10 Essential (primary) hypertension: Secondary | ICD-10-CM | POA: Insufficient documentation

## 2020-03-20 DIAGNOSIS — R1011 Right upper quadrant pain: Secondary | ICD-10-CM | POA: Insufficient documentation

## 2020-03-20 LAB — CBC
HCT: 48.1 % (ref 39.0–52.0)
Hemoglobin: 16.8 g/dL (ref 13.0–17.0)
MCH: 33.2 pg (ref 26.0–34.0)
MCHC: 34.9 g/dL (ref 30.0–36.0)
MCV: 95.1 fL (ref 80.0–100.0)
Platelets: 385 10*3/uL (ref 150–400)
RBC: 5.06 MIL/uL (ref 4.22–5.81)
RDW: 12 % (ref 11.5–15.5)
WBC: 10.3 10*3/uL (ref 4.0–10.5)
nRBC: 0 % (ref 0.0–0.2)

## 2020-03-20 LAB — COMPREHENSIVE METABOLIC PANEL
ALT: 42 U/L (ref 0–44)
AST: 40 U/L (ref 15–41)
Albumin: 3.9 g/dL (ref 3.5–5.0)
Alkaline Phosphatase: 62 U/L (ref 38–126)
Anion gap: 10 (ref 5–15)
BUN: 5 mg/dL — ABNORMAL LOW (ref 6–20)
CO2: 24 mmol/L (ref 22–32)
Calcium: 9 mg/dL (ref 8.9–10.3)
Chloride: 107 mmol/L (ref 98–111)
Creatinine, Ser: 0.7 mg/dL (ref 0.61–1.24)
GFR, Estimated: >60 mL/min (ref >60)
Glucose, Bld: 108 mg/dL — ABNORMAL HIGH (ref 70–99)
Potassium: 3.7 mmol/L (ref 3.5–5.1)
Sodium: 141 mmol/L (ref 135–145)
Total Bilirubin: 0.5 mg/dL (ref 0.3–1.2)
Total Protein: 7.2 g/dL (ref 6.5–8.1)

## 2020-03-20 LAB — URINALYSIS, ROUTINE W REFLEX MICROSCOPIC
Bilirubin Urine: NEGATIVE
Glucose, UA: NEGATIVE mg/dL
Hgb urine dipstick: NEGATIVE
Ketones, ur: NEGATIVE mg/dL
Leukocytes,Ua: NEGATIVE
Nitrite: NEGATIVE
Protein, ur: NEGATIVE mg/dL
Specific Gravity, Urine: 1.018 (ref 1.005–1.030)
pH: 6 (ref 5.0–8.0)

## 2020-03-20 LAB — LIPASE, BLOOD: Lipase: 38 U/L (ref 11–51)

## 2020-03-20 MED ORDER — OXYCODONE-ACETAMINOPHEN 5-325 MG PO TABS
1.0000 | ORAL_TABLET | Freq: Once | ORAL | Status: AC
  Administered 2020-03-20: 1 via ORAL
  Filled 2020-03-20: qty 1

## 2020-03-20 MED ORDER — PANTOPRAZOLE SODIUM 40 MG PO TBEC: 40.0000 mg | DELAYED_RELEASE_TABLET | Freq: Every day | ORAL | 0 refills | Status: DC

## 2020-03-20 MED ORDER — ONDANSETRON 8 MG PO TBDP
8.0000 mg | ORAL_TABLET | Freq: Once | ORAL | Status: AC
  Administered 2020-03-20: 8 mg via ORAL
  Filled 2020-03-20: qty 1

## 2020-03-20 NOTE — Discharge Instructions (Signed)
Your ultrasound and blood work today were reassuring.  Your right upper abdominal pain may still be related to your gallbladder.  Try dietary changes including avoiding alcohol, spicy, greasy and fast foods.  Avoid NSAIDs such as aspirin and ibuprofen.  Follow-up with your primary doctor for recheck.  Return to emergency department if you develop any worsening symptoms such as fever, vomiting or increasing abdominal pain.

## 2020-03-20 NOTE — ED Provider Notes (Signed)
[facility] EMERGENCY DEPARTMENT Provider Note   CSN: [id number] Arrival date & time: 03/20/20  4196     History Chief Complaint  Patient presents with  . Abdominal Pain    Jorge Nichols is a 29 y.o. male.  HPI     Jorge Nichols is a 29 y.o. male who presents to the Emergency Department complaining of intermittent right upper quadrant pain for 2 weeks.  He describes the pain as dull and associated with nausea.  No decreased appetite or vomiting.  No fever or chills.  He has not noticed any increased pain associated with food intake.  He denies diarrhea, dysuria and flank pain.  No lower abdominal tenderness.  No history of abdominal surgeries.    Past Medical History:  Diagnosis Date  . Anxiety   . Chronic headaches   . Chronic hip pain   . Hematuria   . History of avascular necrosis of capital femoral epiphysis    bilateral hip s/p  replacement's  . Hypertension   . Mixed dyslipidemia   . Nephrolithiasis    bilateral  non-obstuctive per ct 12-25-2015  . Other hyperparathyroidism (HCC)   . Right ureteral stone   . Vitamin D deficiency   . Weak urinary stream     Patient Active Problem List   Diagnosis Date Noted  . Chronic pain syndrome 06/01/2019  . Mesenteric panniculitis (HCC) 03/30/2018  . Vitamin D deficiency 06/21/2015  . Other hyperparathyroidism (HCC) 06/05/2015  . BP (high blood pressure) 05/13/2015  . Calculus of kidney 05/13/2015  . Arthralgia of hip 12/04/2013  . Iliopsoas bursitis 12/04/2013  . History of total replacement of both hip joints 07/03/2013  . Avascular necrosis of bone (HCC) 03/20/2013  . Mixed dyslipidemia 09/09/2012  . Metabolic syndrome 09/09/2012  . Morbid obesity (HCC) 09/09/2012  . Sleep disorder breathing 09/09/2012  . Family history of premature CAD 09/09/2012    Past Surgical History:  Procedure Laterality Date  . CYSTOSCOPY WITH URETEROSCOPY, STONE BASKETRY AND STENT PLACEMENT Right 01/02/2016   Procedure:  CYSTOSCOPY WITH RIGHT URETEROSCOPY, STONE EXTRACTION  AND STENT PLACEMENT;  Surgeon: Bjorn Pippin, MD;  Location: [facility];  Service: Urology;  Laterality: Right;  . EXTRACORPOREAL SHOCK WAVE LITHOTRIPSY  yrs ago  . HIP SURGERY Bilateral 2015  . HOLMIUM LASER APPLICATION Right 01/02/2016   Procedure: HOLMIUM LASER APPLICATION;  Surgeon: Bjorn Pippin, MD;  Location: [facility];  Service: Urology;  Laterality: Right;  . TOTAL HIP ARTHROPLASTY Bilateral 2015  . URETEROLITHOTOMY  x2  last one 2007       Family History  Problem Relation Age of Onset  . Hypertension Father     Social History   Tobacco Use  . Smoking status: Current Every Day Smoker    Packs/day: 0.50    Years: 15.00    Pack years: 7.50  . Smokeless tobacco: Current User    Types: Snuff  Substance Use Topics  . Alcohol use: Yes    Comment: occassionally  . Drug use: No    Home Medications Prior to Admission medications   Medication Sig Start Date End Date Taking? Authorizing Provider  allopurinol (ZYLOPRIM) 100 MG tablet Take 1 tablet by mouth 2 (two) times daily. 06/06/19   [provider]  baclofen (LIORESAL) 10 MG tablet Take 10 mg by mouth 3 (three) times daily.    [provider]  meloxicam (MOBIC) 7.5 MG tablet Take 7.5 mg by mouth daily. 06/14/19   [provider]  metoprolol (LOPRESSOR) 50 MG tablet Take 50 mg by mouth 2 (two) times daily.    [provider]  ondansetron (ZOFRAN ODT) 4 MG disintegrating tablet Take 1 tablet (4 mg total) by mouth every 8 (eight) hours as needed for nausea or vomiting. 12/18/19   Farrel [facility], PA-C  oxyCODONE-acetaminophen (PERCOCET/ROXICET) 5-325 MG tablet Take 1 tablet by mouth every 8 (eight) hours as needed for severe pain. 12/18/19   Farrel [facility], PA-C  tamsulosin (FLOMAX) 0.4 MG CAPS capsule Take 1 capsule (0.4 mg total) by mouth daily. 02/04/19   Mesner, Barbara Cower, MD    Allergies    Patient has no known  allergies.  Review of Systems   Review of Systems  Constitutional: Negative for appetite change, chills and fever.  Respiratory: Negative for shortness of breath.   Cardiovascular: Negative for chest pain.  Gastrointestinal: Positive for abdominal pain and nausea. Negative for blood in stool, diarrhea and vomiting.  Genitourinary: Negative for decreased urine volume, difficulty urinating, dysuria, flank pain and frequency.  Musculoskeletal: Negative for back pain and myalgias.  Skin: Negative for color change and rash.  Neurological: Negative for dizziness, weakness and numbness.  Hematological: Negative for adenopathy.    Physical Exam Updated Vital Signs BP 140/82   Pulse 60   Temp 98 F (36.7 C) (Oral)   Resp 15   Ht 5\' 10"  (1.778 m)   Wt 117.9 kg   SpO2 100%   BMI 37.31 kg/m   Physical Exam Vitals and nursing note reviewed.  Constitutional:      General: He is not in acute distress.    Appearance: He is well-developed. He is obese.  Cardiovascular:     Rate and Rhythm: Normal rate and regular rhythm.     Pulses: Normal pulses.  Pulmonary:     Effort: Pulmonary effort is normal.     Breath sounds: Normal breath sounds.  Chest:     Chest wall: No tenderness.  Abdominal:     General: There is no distension.     Palpations: Abdomen is soft.     Tenderness: There is abdominal tenderness. There is no right CVA tenderness, left CVA tenderness or guarding.     Comments: Focal tenderness to deep palpation of the right upper quadrant.  No guarding or rebound tenderness.  Abdomen is soft.  Musculoskeletal:     Right lower leg: No edema.     Left lower leg: No edema.  Skin:    General: Skin is warm.     Capillary Refill: Capillary refill takes less than 2 seconds.     Findings: No rash.  Neurological:     General: No focal deficit present.     Mental Status: He is alert.     Sensory: No sensory deficit.     Motor: No weakness.     ED Results / Procedures /  Treatments   Labs (all labs ordered are listed, but only abnormal results are displayed) Labs Reviewed  COMPREHENSIVE METABOLIC PANEL - Abnormal; Notable for the following components:      Result Value   Glucose, Bld 108 (*)    BUN 5 (*)    All other components within normal limits  URINALYSIS, ROUTINE W REFLEX MICROSCOPIC - Abnormal; Notable for the following components:   APPearance HAZY (*)    All other components within normal limits  LIPASE, BLOOD  CBC    EKG None  Radiology Abdomen Limited  Result Date: 03/20/2020 CLINICAL DATA:  Upper abdominal pain and nausea EXAM: ULTRASOUND ABDOMEN LIMITED RIGHT UPPER QUADRANT COMPARISON:  None. FINDINGS: Gallbladder: No gallstones or wall thickening visualized. There is no pericholecystic fluid. No sonographic Murphy sign noted by sonographer. Common bile duct: Diameter: 2 mm. No intrahepatic or extrahepatic biliary duct dilatation. Liver: No focal lesion identified. Liver echogenicity increased diffusely. Portal vein is patent on color Doppler imaging with normal direction of blood flow towards the liver. Other: None. IMPRESSION: 1. Diffuse increase in liver echogenicity, a finding indicative of hepatic steatosis. No focal liver lesions evident. Note that the sensitivity of ultrasound for detection of focal liver lesions is diminished in this circumstance. 2.  Study otherwise unremarkable. Electronically Signed   By: Bretta Bang III M.D.   On: 03/20/2020 13:33    Procedures Procedures (including critical care time)  Medications Ordered in ED Medications  ondansetron (ZOFRAN-ODT) disintegrating tablet 8 mg (8 mg Oral Given 03/20/20 1256)  oxyCODONE-acetaminophen (PERCOCET/ROXICET) 5-325 MG per tablet 1 tablet (1 tablet Oral Given 03/20/20 1256)    ED Course  I have reviewed the triage vital signs and the nursing notes.  Pertinent labs & imaging results that were available during my care of the patient were reviewed by me and  considered in my medical decision making (see chart for details).    MDM Rules/Calculators/A&P                         Patient here with intermittent episodes of right upper quadrant pain associated with nausea.  No vomiting, fever or chills.  No right lower quadrant tenderness on exam.  Patient well-appearing.  Vital signs reassuring.  History concerning for gallbladder disease.  Will obtain labs and ultrasound abdomen.  Labs unremarkable, ultrasound without findings suggestive of cholelithiasis or acute cholecystitis.  No exam findings to suggest appendicitis.  Patient well-appearing.  Given reassuring work-up, I feel that he is appropriate for discharge home although would benefit from a PPI and dietary changes.  I have provided strict return precautions and he agrees to plan.   Final Clinical Impression(s) / ED Diagnoses Final diagnoses:  RUQ pain    Rx / DC Orders ED Discharge Orders    None       Pauline Aus, PA-C 03/20/20 1507    Pollyann Savoy, MD 03/21/20 [phone]

## 2020-03-20 NOTE — ED Triage Notes (Signed)
Pt presents to ED with complaints of right upper quadrant abdominal pain and nausea started couple weeks ago.

## 2020-09-07 ENCOUNTER — Other Ambulatory Visit: Payer: Self-pay

## 2020-09-07 ENCOUNTER — Emergency Department (HOSPITAL_COMMUNITY): Payer: Self-pay

## 2020-09-07 ENCOUNTER — Encounter (HOSPITAL_COMMUNITY): Payer: Self-pay | Admitting: Emergency Medicine

## 2020-09-07 ENCOUNTER — Emergency Department (HOSPITAL_COMMUNITY)
Admission: EM | Admit: 2020-09-07 | Discharge: 2020-09-07 | Disposition: A | Discharge status: Home / Self Care | Payer: Self-pay | Attending: Emergency Medicine | Admitting: Emergency Medicine

## 2020-09-07 DIAGNOSIS — F17210 Nicotine dependence, cigarettes, uncomplicated: Secondary | ICD-10-CM | POA: Insufficient documentation

## 2020-09-07 DIAGNOSIS — Y9384 Activity, sleeping: Secondary | ICD-10-CM | POA: Insufficient documentation

## 2020-09-07 DIAGNOSIS — I10 Essential (primary) hypertension: Secondary | ICD-10-CM | POA: Insufficient documentation

## 2020-09-07 DIAGNOSIS — S99921A Unspecified injury of right foot, initial encounter: Secondary | ICD-10-CM | POA: Insufficient documentation

## 2020-09-07 DIAGNOSIS — W08XXXA Fall from other furniture, initial encounter: Secondary | ICD-10-CM | POA: Insufficient documentation

## 2020-09-07 DIAGNOSIS — Z96643 Presence of artificial hip joint, bilateral: Secondary | ICD-10-CM | POA: Insufficient documentation

## 2020-09-07 DIAGNOSIS — Z79899 Other long term (current) drug therapy: Secondary | ICD-10-CM | POA: Insufficient documentation

## 2020-09-07 NOTE — ED Triage Notes (Addendum)
Patient c/o right foot/ankle pain. Per patient fell asleep on couch with feet crossed. Per patient "I think my foot had fallen asleep because when I stood up to walk my foot turned under as I was stepping down." Patient reports taking hydrocodone at 8am this morning with no relief.

## 2020-09-07 NOTE — Discharge Instructions (Addendum)
You have been seen here for foot pain/.  I placed you in a postop boot please wear during the day you may take off at nighttime.  I recommend taking over-the-counter pain medications like ibuprofen and/or Tylenol every 6 as needed.  Please follow dosage and on the back of bottle.  I also recommend applying heat to the area and stretching out the muscles as this will help decrease stiffness and pain.   Please follow-up with orthopedic surgery for further evaluation.  Come back to the emergency department if you develop chest pain, shortness of breath, severe abdominal pain, uncontrolled nausea, vomiting, diarrhea.

## 2020-09-07 NOTE — ED Provider Notes (Signed)
[facility] EMERGENCY DEPARTMENT Provider Note   CSN: [id number] Arrival date & time: 09/07/20  1646     History Chief Complaint  Patient presents with   Foot Injury    Jorge Nichols is a 30 y.o. male.  HPI  Patient with significant medical history of anxiety, hypertension, presents with chief complaint of right ankle pain.  Patient states today he was sleeping on his couch and his foot fell asleep, he went to stand up but could not feel it and rolled his foot backwards.  He denies actually falling, hitting his head or losing conscious.  Patient states he has severe pain in that ankle, is able to bear weight but it is extremely painful.  He denies paresthesias or weakness in his toes or foot.  Patient has tried taking hydrocodone without  relief.  He denies alleviating factors.  Patient denies headaches, fevers, chills, shortness of breath or chest pain.  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   Vitamin D deficiency 06/21/2015   Other hyperparathyroidism (HCC) 06/05/2015   BP (high blood pressure) 05/13/2015   Calculus of kidney 05/13/2015   Arthralgia of hip 12/04/2013   Iliopsoas bursitis 12/04/2013   History of total replacement of both hip joints 07/03/2013   Avascular necrosis of bone (HCC) 03/20/2013   Mixed dyslipidemia 09/09/2012   Metabolic syndrome 09/09/2012   Morbid obesity (HCC) 09/09/2012   Sleep disorder breathing 09/09/2012   Family history of premature  CAD 09/09/2012    Past Surgical History:  Procedure Laterality Date   CYSTOSCOPY WITH URETEROSCOPY, STONE BASKETRY AND STENT PLACEMENT Right 01/02/2016   Procedure: CYSTOSCOPY WITH RIGHT URETEROSCOPY, STONE EXTRACTION  AND STENT PLACEMENT;  Surgeon: Bjorn Pippin, MD;  Location: [facility];  Service: Urology;  Laterality: Right;   EXTRACORPOREAL SHOCK WAVE LITHOTRIPSY  yrs ago   HIP SURGERY Bilateral 2015   HOLMIUM LASER APPLICATION Right 01/02/2016   Procedure: HOLMIUM LASER APPLICATION;  Surgeon: Bjorn Pippin, MD;  Location: [facility];  Service: Urology;  Laterality: Right;   TOTAL HIP ARTHROPLASTY Bilateral 2015   URETEROLITHOTOMY  x2  last one 2007       Family History  Problem Relation Age of Onset   Hypertension Father     Social History   Tobacco Use   Smoking status: Every Day    Packs/day: 0.50    Years: 15.00    Pack years: 7.50    Types: Cigarettes   Smokeless tobacco: Current    Types: Snuff  Vaping Use   Vaping Use: Never used  Substance Use Topics   Alcohol use: Yes    Comment: occassionally   Drug use: No    Home Medications Prior to Admission medications   Medication Sig Start Date End Date Taking? Authorizing Provider  allopurinol (ZYLOPRIM) 100 MG tablet Take 1 tablet by mouth 2 (two) times daily.  06/06/19   [provider]  baclofen (LIORESAL) 10 MG tablet Take 10 mg by mouth 3 (three) times daily.    [provider]  meloxicam (MOBIC) 7.5 MG tablet Take 7.5 mg by mouth daily. 06/14/19   [provider]  metoprolol (LOPRESSOR) 50 MG tablet Take 50 mg by mouth 2 (two) times daily.    [provider]  ondansetron (ZOFRAN ODT) 4 MG disintegrating tablet Take 1 tablet (4 mg total) by mouth every 8 (eight) hours as needed for nausea or vomiting. 12/18/19   Farrel [facility], PA-C  oxyCODONE-acetaminophen (PERCOCET/ROXICET) 5-325 MG tablet Take 1 tablet by mouth every 8 (eight) hours as needed for  severe pain. 12/18/19   Farrel [facility], PA-C  pantoprazole (PROTONIX) 40 MG tablet Take 1 tablet (40 mg total) by mouth daily. 03/20/20   Triplett, Tammy, PA-C  tamsulosin (FLOMAX) 0.4 MG CAPS capsule Take 1 capsule (0.4 mg total) by mouth daily. 02/04/19   Mesner, Barbara Cower, MD    Allergies    Patient has no known allergies.  Review of Systems   Review of Systems  Constitutional:  Negative for chills and fever.  HENT:  Negative for congestion.   Respiratory:  Negative for shortness of breath.   Cardiovascular:  Negative for chest pain.  Gastrointestinal:  Negative for abdominal pain.  Genitourinary:  Negative for enuresis.  Musculoskeletal:  Negative for back pain.       Right foot/ankle pain.  Skin:  Negative for rash.  Neurological:  Negative for dizziness.  Hematological:  Does not bruise/bleed easily.   Physical Exam Updated Vital Signs BP 126/90 (BP Location: Left Arm)   Pulse 80   Temp 98.3 F (36.8 C) (Oral)   Resp 18   Ht 5\' 9"  (1.753 m)   Wt 122.5 kg   SpO2 97%   BMI 39.87 kg/m   Physical Exam Vitals and nursing note reviewed.  Constitutional:      General: He is not in acute distress.    Appearance: Normal appearance. He is not ill-appearing or diaphoretic.  HENT:     Head: Normocephalic and atraumatic.     Nose: No congestion or rhinorrhea.  Eyes:     General:        Right eye: No discharge.        Left eye: No discharge.     Conjunctiva/sclera: Conjunctivae normal.  Cardiovascular:     Rate and Rhythm: Normal rate and regular rhythm.  Pulmonary:     Effort: Pulmonary effort is normal.  Musculoskeletal:     Cervical back: Neck supple.     Right lower leg: No edema.     Left lower leg: No edema.     Comments: Patient's right foot was evaluated there is no gross deformities present, no erythema or edema noted, he is able to move his toes ankle and knee without difficulty, he was tender to palpation at his metatarsals 2 through 4.  Neurovascular fully  intact.  Skin:    General: Skin is warm and dry.     Coloration: Skin is not jaundiced or pale.  Neurological:     Mental Status: He is alert and oriented to person, place, and time.  Psychiatric:        Mood and Affect: Mood normal.    ED Results / Procedures / Treatments   Labs (all labs ordered are listed, but only abnormal results are displayed) Labs Reviewed - No data to display  EKG None  Radiology DG  Foot Complete Right  Result Date: 09/07/2020 CLINICAL DATA:  Tenderness along the 3rd to 5th metatarsal bones. EXAM: RIGHT FOOT COMPLETE - 3+ VIEW COMPARISON:  None. FINDINGS: There is no evidence of fracture or dislocation. There is no evidence of arthropathy or other focal bone abnormality. Two linear metallic densities are seen overlying the third and fourth proximal metatarsal bones, potentially representing foreign bodies. Mild dorsal soft tissue swelling. IMPRESSION: 1. No acute fracture or dislocation identified about the right foot. 2. Two linear metallic densities overlying the third and fourth proximal metatarsal bones, potentially representing foreign bodies. Please correlate to patient's history and physical exam. Electronically Signed   By: Ted Mcalpine M.D.   On: 09/07/2020 18:52    Procedures Procedures   Medications Ordered in ED Medications - No data to display  ED Course  I have reviewed the triage vital signs and the nursing notes.  Pertinent labs & imaging results that were available during my care of the patient were reviewed by me and considered in my medical decision making (see chart for details).    MDM Rules/Calculators/A&P                         Initial impression-patient presents with right foot pain.  He is alert, does not appear acute stress, vital signs reassuring.  Concern for possible orthopedic injury, will take imaging for further evaluation.  Work-up-x-ray foot negative for acute findings, does show 2 linear metallic densities  overlying the third and fourth proximal metatarsal bones.  Rule out-   Low suspicion for fracture or dislocation as x-ray does not feel any significant findings.  There is note of 2 foreign bodies I doubt this is associated this injury as there is no breakage of the skin, suspect this is from a previous injury.  Low suspicion for ligament or tendon damage as area was palpated no gross defects noted, he has full range of motion at his toes and ankle.  Low suspicion for compartment syndrome as area was palpated it was soft to the touch, neurovascular fully intact.   Plan-  Right foot pain-I suspect secondary muscular strain but cannot exclude the possibility of a partial ligament tear, will place patient in a postop shoe, provide with crutches, have him follow-up with orthopedic surgery for further evaluation.  Vital signs have remained stable, no indication for hospital admission.  Patient given at home care as well strict return precautions.  Patient verbalized that they understood agreed to said plan.  Final Clinical Impression(s) / ED Diagnoses Final diagnoses:  Injury of right foot, initial encounter    Rx / DC Orders ED Discharge Orders     None        Carroll Sage, PA-C 09/07/20 Vicki Mallet, MD 09/13/20 1739

## 2020-12-04 ENCOUNTER — Emergency Department (HOSPITAL_COMMUNITY)
Admission: EM | Admit: 2020-12-04 | Discharge: 2020-12-04 | Disposition: A | Discharge status: Home / Self Care | Payer: Self-pay | Attending: Emergency Medicine | Admitting: Emergency Medicine

## 2020-12-04 ENCOUNTER — Other Ambulatory Visit: Payer: Self-pay

## 2020-12-04 ENCOUNTER — Emergency Department (HOSPITAL_COMMUNITY): Payer: Self-pay

## 2020-12-04 ENCOUNTER — Encounter (HOSPITAL_COMMUNITY): Payer: Self-pay | Admitting: Emergency Medicine

## 2020-12-04 DIAGNOSIS — N202 Calculus of kidney with calculus of ureter: Secondary | ICD-10-CM | POA: Insufficient documentation

## 2020-12-04 DIAGNOSIS — N201 Calculus of ureter: Secondary | ICD-10-CM

## 2020-12-04 DIAGNOSIS — I10 Essential (primary) hypertension: Secondary | ICD-10-CM | POA: Insufficient documentation

## 2020-12-04 DIAGNOSIS — F17210 Nicotine dependence, cigarettes, uncomplicated: Secondary | ICD-10-CM | POA: Insufficient documentation

## 2020-12-04 DIAGNOSIS — Z79899 Other long term (current) drug therapy: Secondary | ICD-10-CM | POA: Insufficient documentation

## 2020-12-04 DIAGNOSIS — Z96643 Presence of artificial hip joint, bilateral: Secondary | ICD-10-CM | POA: Insufficient documentation

## 2020-12-04 DIAGNOSIS — D72829 Elevated white blood cell count, unspecified: Secondary | ICD-10-CM | POA: Insufficient documentation

## 2020-12-04 LAB — COMPREHENSIVE METABOLIC PANEL
ALT: 32 U/L (ref 0–44)
AST: 22 U/L (ref 15–41)
Albumin: 4.1 g/dL (ref 3.5–5.0)
Alkaline Phosphatase: 73 U/L (ref 38–126)
Anion gap: 10 (ref 5–15)
BUN: 9 mg/dL (ref 6–20)
CO2: 26 mmol/L (ref 22–32)
Calcium: 8.8 mg/dL — ABNORMAL LOW (ref 8.9–10.3)
Chloride: 102 mmol/L (ref 98–111)
Creatinine, Ser: 0.91 mg/dL (ref 0.61–1.24)
GFR, Estimated: >60 mL/min (ref >60)
Glucose, Bld: 106 mg/dL — ABNORMAL HIGH (ref 70–99)
Potassium: 3.9 mmol/L (ref 3.5–5.1)
Sodium: 138 mmol/L (ref 135–145)
Total Bilirubin: 0.3 mg/dL (ref 0.3–1.2)
Total Protein: 7.5 g/dL (ref 6.5–8.1)

## 2020-12-04 LAB — URINALYSIS, ROUTINE W REFLEX MICROSCOPIC
Bilirubin Urine: NEGATIVE
Glucose, UA: NEGATIVE mg/dL
Ketones, ur: NEGATIVE mg/dL
Leukocytes,Ua: NEGATIVE
Nitrite: NEGATIVE
Protein, ur: 100 mg/dL — AB
RBC / HPF: >50 RBC/hpf — ABNORMAL HIGH (ref 0–5)
Specific Gravity, Urine: 1.025 (ref 1.005–1.030)
pH: 5 (ref 5.0–8.0)

## 2020-12-04 LAB — CBC
HCT: 49.5 % (ref 39.0–52.0)
Hemoglobin: 16.8 g/dL (ref 13.0–17.0)
MCH: 33.9 pg (ref 26.0–34.0)
MCHC: 33.9 g/dL (ref 30.0–36.0)
MCV: 99.8 fL (ref 80.0–100.0)
Platelets: 324 10*3/uL (ref 150–400)
RBC: 4.96 MIL/uL (ref 4.22–5.81)
RDW: 12.6 % (ref 11.5–15.5)
WBC: 11.6 10*3/uL — ABNORMAL HIGH (ref 4.0–10.5)
nRBC: 0 % (ref 0.0–0.2)

## 2020-12-04 MED ORDER — OXYCODONE-ACETAMINOPHEN 5-325 MG PO TABS: 1.0000 | ORAL_TABLET | Freq: Four times a day (QID) | ORAL | 0 refills | Status: DC | PRN

## 2020-12-04 MED ORDER — TAMSULOSIN HCL 0.4 MG PO CAPS
0.4000 mg | ORAL_CAPSULE | Freq: Once | ORAL | Status: AC
  Administered 2020-12-04: 0.4 mg via ORAL
  Filled 2020-12-04: qty 1

## 2020-12-04 MED ORDER — ONDANSETRON HCL 4 MG/2ML IJ SOLN
4.0000 mg | Freq: Once | INTRAMUSCULAR | Status: AC
  Administered 2020-12-04: 4 mg via INTRAVENOUS
  Filled 2020-12-04: qty 2

## 2020-12-04 MED ORDER — HYDROCODONE-ACETAMINOPHEN 5-325 MG PO TABS
1.0000 | ORAL_TABLET | Freq: Once | ORAL | Status: AC
  Administered 2020-12-04: 1 via ORAL
  Filled 2020-12-04: qty 1

## 2020-12-04 MED ORDER — TAMSULOSIN HCL 0.4 MG PO CAPS: 0.4000 mg | ORAL_CAPSULE | Freq: Every day | ORAL | 0 refills | Status: AC

## 2020-12-04 MED ORDER — MORPHINE SULFATE (PF) 4 MG/ML IV SOLN
4.0000 mg | Freq: Once | INTRAVENOUS | Status: AC
  Administered 2020-12-04: 4 mg via INTRAVENOUS
  Filled 2020-12-04: qty 1

## 2020-12-04 MED ORDER — ONDANSETRON HCL 4 MG PO TABS: 4.0000 mg | ORAL_TABLET | Freq: Four times a day (QID) | ORAL | 0 refills | Status: DC

## 2020-12-04 MED ORDER — KETOROLAC TROMETHAMINE 30 MG/ML IJ SOLN
30.0000 mg | Freq: Once | INTRAMUSCULAR | Status: AC
  Administered 2020-12-04: 30 mg via INTRAVENOUS
  Filled 2020-12-04: qty 1

## 2020-12-04 NOTE — Discharge Instructions (Signed)
Today you were diagnosed with a kidney stone on your CT scan.  You will be given a prescription for Flomax, pain medication, and nausea medication.  You should not drive, work, or operate machinery while taking the pain medication as it can make you very drowsy.  You will need to follow-up with urology for reevaluation and for further treatment of your kidney stone.  You will need to return to the emergency department for any fevers, persistent pain, persistent vomiting, inability to urinate, or any new or worsening symptoms.  

## 2020-12-04 NOTE — ED Triage Notes (Signed)
Pt c/o left flank pain and bloody urine for a few days.

## 2020-12-04 NOTE — ED Provider Notes (Signed)
[facility] EMERGENCY DEPARTMENT Provider Note   CSN: [id number] Arrival date & time: 12/04/20  1932     History Chief Complaint  Patient presents with   Flank Pain    Jorge Nichols is a 30 y.o. male.  HPI   Pt is a 30 y/o male who presents to the Ed today for eval of left flank and left abd pain that started yesterday. States his sxs are severe and feel consistent with prior kidney stones. Reports difficulty urinating and hematuria. Denies fevers, vomiting but has had nausea.   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   Vitamin D deficiency 06/21/2015   Other hyperparathyroidism (HCC) 06/05/2015   BP (high blood pressure) 05/13/2015   Calculus of kidney 05/13/2015   Arthralgia of hip 12/04/2013   Iliopsoas bursitis 12/04/2013   History of total replacement of both hip joints 07/03/2013   Avascular necrosis of bone (HCC) 03/20/2013   Mixed dyslipidemia 09/09/2012   Metabolic syndrome 09/09/2012   Morbid obesity (HCC) 09/09/2012   Sleep disorder breathing 09/09/2012   Family history of premature CAD 09/09/2012    Past Surgical History:  Procedure Laterality Date   CYSTOSCOPY WITH URETEROSCOPY, STONE BASKETRY AND STENT PLACEMENT Right 01/02/2016   Procedure: CYSTOSCOPY WITH RIGHT URETEROSCOPY, STONE EXTRACTION  AND STENT PLACEMENT;  Surgeon: Bjorn Pippin, MD;  Location: [facility] [place];  Service: Urology;  Laterality: Right;   EXTRACORPOREAL SHOCK WAVE  LITHOTRIPSY  yrs ago   HIP SURGERY Bilateral 2015   HOLMIUM LASER APPLICATION Right 01/02/2016   Procedure: HOLMIUM LASER APPLICATION;  Surgeon: Bjorn Pippin, MD;  Location: [facility];  Service: Urology;  Laterality: Right;   TOTAL HIP ARTHROPLASTY Bilateral 2015   URETEROLITHOTOMY  x2  last one 2007       Family History  Problem Relation Age of Onset   Hypertension Father     Social History   Tobacco Use   Smoking status: Every Day    Packs/day: 0.50    Years: 15.00    Pack years: 7.50    Types: Cigarettes   Smokeless tobacco: Current    Types: Snuff  Vaping Use   Vaping Use: Never used  Substance Use Topics   Alcohol use: Yes    Comment: occassionally   Drug use: No    Home Medications Prior to Admission medications   Medication Sig Start Date End Date Taking? Authorizing Provider  ondansetron (ZOFRAN) 4 MG tablet Take 1 tablet (4 mg total) by mouth every 6 (six) hours. 12/04/20  Yes Glady Ouderkirk S, PA-C  oxyCODONE-acetaminophen (PERCOCET/ROXICET) 5-325 MG tablet Take 1 tablet by mouth every 6 (six) hours as needed for severe pain. 12/04/20  Yes Miosotis Wetsel S, PA-C  tamsulosin (FLOMAX) 0.4 MG CAPS capsule Take 1 capsule (0.4 mg total) by mouth daily. 12/04/20 01/03/21 Yes Marshall Roehrich S, PA-C  allopurinol (ZYLOPRIM) 100  MG tablet Take 1 tablet by mouth 2 (two) times daily. 06/06/19   [provider]  baclofen (LIORESAL) 10 MG tablet Take 10 mg by mouth 3 (three) times daily.    [provider]  meloxicam (MOBIC) 7.5 MG tablet Take 7.5 mg by mouth daily. 06/14/19   [provider]  metoprolol (LOPRESSOR) 50 MG tablet Take 50 mg by mouth 2 (two) times daily.    [provider]  ondansetron (ZOFRAN ODT) 4 MG disintegrating tablet Take 1 tablet (4 mg total) by mouth every 8 (eight) hours as needed for nausea or vomiting. 12/18/19   Farrel Gordon, PA-C  oxyCODONE-acetaminophen (PERCOCET/ROXICET) 5-325 MG tablet Take 1  tablet by mouth every 8 (eight) hours as needed for severe pain. 12/18/19   Farrel Gordon, PA-C  pantoprazole (PROTONIX) 40 MG tablet Take 1 tablet (40 mg total) by mouth daily. 03/20/20   Triplett, Tammy, PA-C  tamsulosin (FLOMAX) 0.4 MG CAPS capsule Take 1 capsule (0.4 mg total) by mouth daily. 02/04/19   Mesner, Barbara Cower, MD    Allergies    Patient has no known allergies.  Review of Systems   Review of Systems  Constitutional:  Positive for fever.  HENT:  Negative for ear pain and sore throat.   Eyes:  Negative for visual disturbance.  Respiratory:  Negative for cough and shortness of breath.   Cardiovascular:  Negative for chest pain.  Gastrointestinal:  Positive for nausea. Negative for abdominal pain, constipation, diarrhea and vomiting.  Genitourinary:  Positive for dysuria, flank pain and hematuria.  Musculoskeletal:  Negative for arthralgias and back pain.  Skin:  Negative for rash.  Neurological:  Negative for headaches.  All other systems reviewed and are negative.  Physical Exam Updated Vital Signs BP 131/76   Pulse 86   Temp 98.3 F (36.8 C) (Oral)   Resp 18   Ht 5\' 9"  (1.753 m)   Wt 131.5 kg   SpO2 98%   BMI 42.83 kg/m   Physical Exam Vitals and nursing note reviewed.  Constitutional:      Appearance: He is well-developed.  HENT:     Head: Normocephalic and atraumatic.  Eyes:     Conjunctiva/sclera: Conjunctivae normal.  Cardiovascular:     Rate and Rhythm: Normal rate and regular rhythm.     Heart sounds: Normal heart sounds. No murmur heard. Pulmonary:     Effort: Pulmonary effort is normal. No respiratory distress.     Breath sounds: Normal breath sounds. No wheezing, rhonchi or rales.  Abdominal:     General: Bowel sounds are normal.     Palpations: Abdomen is soft.     Tenderness: There is no abdominal tenderness. There is no guarding or rebound.  Musculoskeletal:     Cervical back: Neck supple.  Skin:    General: Skin is warm and dry.   Neurological:     Mental Status: He is alert.    ED Results / Procedures / Treatments   Labs (all labs ordered are listed, but only abnormal results are displayed) Labs Reviewed  CBC - Abnormal; Notable for the following components:      Result Value   WBC 11.6 (*)    All other components within normal limits  URINALYSIS, ROUTINE W REFLEX MICROSCOPIC - Abnormal; Notable for the following components:   Color, Urine AMBER (*)    APPearance CLOUDY (*)    Hgb urine dipstick LARGE (*)    Protein, ur 100 (*)    RBC /  HPF >50 (*)    Bacteria, UA RARE (*)    All other components within normal limits  COMPREHENSIVE METABOLIC PANEL - Abnormal; Notable for the following components:   Glucose, Bld 106 (*)    Calcium 8.8 (*)    All other components within normal limits    EKG None  Radiology CT Renal Stone Study  Result Date: 12/04/2020 CLINICAL DATA:  Left flank pain.  Blood in urine. EXAM: CT ABDOMEN AND PELVIS WITHOUT CONTRAST TECHNIQUE: Multidetector CT imaging of the abdomen and pelvis was performed following the standard protocol without IV contrast. COMPARISON:  CT renal stone 12/08/2019. FINDINGS: Lower chest: No acute abnormality. Hepatobiliary: No focal liver abnormality is seen. No gallstones, gallbladder wall thickening, or biliary dilatation. Pancreas: Unremarkable. No pancreatic ductal dilatation or surrounding inflammatory changes. Spleen: Normal in size without focal abnormality. Adrenals/Urinary Tract: Bilateral adrenal glands are within normal limits. There is a 5 mm calculus at the left ureteropelvic junction. There is no hydronephrosis. There are additional punctate nonobstructing bilateral renal calculi measuring up to 4 mm. Ureters are nondilated. No ureteral calculi are seen. The bladder is not well evaluated secondary to streak artifact in the pelvis. Additionally, the distal ureters are not well seen secondary to streak artifact. Stomach/Bowel: Stomach is within normal  limits. Appendix appears normal. No evidence of bowel wall thickening, distention, or inflammatory changes. Vascular/Lymphatic: No significant vascular findings are present. No enlarged abdominal or pelvic lymph nodes. Reproductive: Prostate gland is not well evaluated secondary to streak artifact in the pelvis. Other: There is a small fat containing umbilical hernia. There is no abdominopelvic ascites. Musculoskeletal: Bilateral hip arthroplasties are present. IMPRESSION: 1. 5 mm calculus at the left ureteropelvic junction. No hydronephrosis. 2. Additional bilateral nonobstructing renal calculi. 3. Bladder is not well evaluated secondary to streak artifact in the pelvis. Electronically Signed   By: Darliss Cheney M.D.   On: 12/04/2020 21:20    Procedures Procedures   Medications Ordered in ED Medications  ketorolac (TORADOL) 30 MG/ML injection 30 mg (30 mg Intravenous Given 12/04/20 2039)  ondansetron (ZOFRAN) injection 4 mg (4 mg Intravenous Given 12/04/20 2038)  tamsulosin (FLOMAX) capsule 0.4 mg (0.4 mg Oral Given 12/04/20 2037)  morphine 4 MG/ML injection 4 mg (4 mg Intravenous Given 12/04/20 2146)  HYDROcodone-acetaminophen (NORCO/VICODIN) 5-325 MG per tablet 1 tablet (1 tablet Oral Given 12/04/20 2217)    ED Course  I have reviewed the triage vital signs and the nursing notes.  Pertinent labs & imaging results that were available during my care of the patient were reviewed by me and considered in my medical decision making (see chart for details).    MDM Rules/Calculators/A&P                          30 year old male here with flank pain that feels consistent with prior kidney stone  Reviewed/interpreted labs CBC with mild leukocytosis, no anemia CMP at baseline, normal cr UA neg for uti, hematuria noted  Reviewed/interpreted imaging CT renal - consistent with ureteral stone with no hydronephrosis  Patient was given IV fluids, antiemetics and analgesics.  On reassessment he feels  improved. He was able to tolerate po. Will d/c with pain meds, tamsulosin, zofran. Advised f/u with urology. He states he is already established. Advised on return precautions. He voices understanding and is in agreement with plan. All questions answered, pt stable for discharge  Final Clinical Impression(s) / ED Diagnoses Final diagnoses:  Ureteral stone  Rx / DC Orders ED Discharge Orders          Ordered    oxyCODONE-acetaminophen (PERCOCET/ROXICET) 5-325 MG tablet  Every 6 hours PRN        12/04/20 2223    tamsulosin (FLOMAX) 0.4 MG CAPS capsule  Daily        12/04/20 2223    ondansetron (ZOFRAN) 4 MG tablet  Every 6 hours        12/04/20 2223             Rayne Du 12/04/20 2225    Cheryll Cockayne, MD 12/25/20 1627

## 2020-12-09 ENCOUNTER — Other Ambulatory Visit: Payer: Self-pay

## 2020-12-09 ENCOUNTER — Emergency Department (HOSPITAL_COMMUNITY)
Admission: EM | Admit: 2020-12-09 | Discharge: 2020-12-09 | Disposition: A | Discharge status: Home / Self Care | Payer: Medicaid Other | Attending: Emergency Medicine | Admitting: Emergency Medicine

## 2020-12-09 ENCOUNTER — Encounter (HOSPITAL_COMMUNITY): Payer: Self-pay | Admitting: Emergency Medicine

## 2020-12-09 DIAGNOSIS — I10 Essential (primary) hypertension: Secondary | ICD-10-CM | POA: Insufficient documentation

## 2020-12-09 DIAGNOSIS — Z79899 Other long term (current) drug therapy: Secondary | ICD-10-CM | POA: Insufficient documentation

## 2020-12-09 DIAGNOSIS — R109 Unspecified abdominal pain: Secondary | ICD-10-CM | POA: Insufficient documentation

## 2020-12-09 DIAGNOSIS — F17210 Nicotine dependence, cigarettes, uncomplicated: Secondary | ICD-10-CM | POA: Insufficient documentation

## 2020-12-09 DIAGNOSIS — Z96643 Presence of artificial hip joint, bilateral: Secondary | ICD-10-CM | POA: Insufficient documentation

## 2020-12-09 LAB — URINALYSIS, ROUTINE W REFLEX MICROSCOPIC
Bacteria, UA: NONE SEEN
Bilirubin Urine: NEGATIVE
Glucose, UA: NEGATIVE mg/dL
Ketones, ur: NEGATIVE mg/dL
Leukocytes,Ua: NEGATIVE
Nitrite: NEGATIVE
Protein, ur: 30 mg/dL — AB
RBC / HPF: >50 RBC/hpf — ABNORMAL HIGH (ref 0–5)
Specific Gravity, Urine: 1.021 (ref 1.005–1.030)
pH: 6 (ref 5.0–8.0)

## 2020-12-09 MED ORDER — HYDROMORPHONE HCL 1 MG/ML IJ SOLN
1.0000 mg | Freq: Once | INTRAMUSCULAR | Status: AC
  Administered 2020-12-09: 1 mg via INTRAMUSCULAR
  Filled 2020-12-09: qty 1

## 2020-12-09 MED ORDER — KETOROLAC TROMETHAMINE 60 MG/2ML IM SOLN
60.0000 mg | Freq: Once | INTRAMUSCULAR | Status: AC
  Administered 2020-12-09: 60 mg via INTRAMUSCULAR
  Filled 2020-12-09: qty 2

## 2020-12-09 MED ORDER — ONDANSETRON 4 MG PO TBDP
4.0000 mg | ORAL_TABLET | Freq: Once | ORAL | Status: AC
  Administered 2020-12-09: 4 mg via ORAL
  Filled 2020-12-09: qty 1

## 2020-12-09 MED ORDER — HYDROMORPHONE HCL 2 MG PO TABS: 2.0000 mg | ORAL_TABLET | Freq: Two times a day (BID) | ORAL | 0 refills | Status: AC | PRN

## 2020-12-09 MED ORDER — IBUPROFEN 800 MG PO TABS: 800.0000 mg | ORAL_TABLET | Freq: Three times a day (TID) | ORAL | 0 refills | Status: DC | PRN

## 2020-12-09 NOTE — ED Provider Notes (Signed)
[facility] EMERGENCY DEPARTMENT Provider Note   CSN: [id number] Arrival date & time: 12/09/20  1820     History Chief Complaint  Patient presents with   Flank Pain    Jorge Nichols is a 30 y.o. male.  Pt complains of left flank pain.  Pt was seen and diagnosed with a kidney stone 9/14.  Pt reports pain had improved but returned today and he began urinating blood  Pt has had multiple kidney stones that he has passed in the past  Pt has seen urology in the past   The history is provided by the patient. No language interpreter was used.  Flank Pain This is a recurrent problem. The current episode started 3 to 5 hours ago. The problem occurs constantly. Associated symptoms include abdominal pain. Nothing aggravates the symptoms. Nothing relieves the symptoms. He has tried nothing for the symptoms. The treatment provided no relief.      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   Vitamin D deficiency 06/21/2015   Other hyperparathyroidism (HCC) 06/05/2015   BP (high blood pressure) 05/13/2015   Calculus of kidney 05/13/2015   Arthralgia of hip 12/04/2013   Iliopsoas bursitis 12/04/2013   History of total replacement of both hip joints 07/03/2013   Avascular necrosis of bone (HCC) 03/20/2013   Mixed dyslipidemia 09/09/2012   Metabolic syndrome 09/09/2012   Morbid obesity (HCC) 09/09/2012   Sleep disorder breathing 09/09/2012   Family history of premature CAD 09/09/2012     Past Surgical History:  Procedure Laterality Date   CYSTOSCOPY WITH URETEROSCOPY, STONE BASKETRY AND STENT PLACEMENT Right 01/02/2016   Procedure: CYSTOSCOPY WITH RIGHT URETEROSCOPY, STONE EXTRACTION  AND STENT PLACEMENT;  Surgeon: Bjorn Pippin, MD;  Location: [facility] [place];  Service: Urology;  Laterality: Right;   EXTRACORPOREAL SHOCK WAVE LITHOTRIPSY  yrs ago   HIP SURGERY Bilateral 2015   HOLMIUM LASER APPLICATION Right 01/02/2016   Procedure: HOLMIUM LASER APPLICATION;  Surgeon: Bjorn Pippin, MD;  Location: [facility];  Service: Urology;  Laterality: Right;   TOTAL HIP ARTHROPLASTY Bilateral 2015   URETEROLITHOTOMY  x2  last one 2007       Family History  Problem Relation Age of Onset   Hypertension Father     Social History   Tobacco Use   Smoking status: Every Day    Packs/day: 0.50    Years: 15.00    Pack years: 7.50    Types: Cigarettes   Smokeless tobacco: Current    Types: Snuff  Vaping Use   Vaping Use: Never used  Substance Use Topics   Alcohol use: Yes    Comment: occassionally   Drug use: No    Home Medications Prior to Admission medications   Medication Sig Start Date End Date Taking? Authorizing Provider  cyclobenzaprine (FLEXERIL) 10 MG tablet 1 tablet at bedtime as needed 01/02/20  Yes [provider]  HYDROmorphone (DILAUDID) 2 MG tablet Take 1 tablet (2 mg total) by mouth every 12 (twelve) hours as needed for up to 5 days for severe pain. 12/09/20 12/14/20 Yes Elson Areas, PA-C  ibuprofen (ADVIL) 800 MG tablet Take 1 tablet (800 mg total) by mouth every 8 (eight) hours as needed. 12/09/20  Yes Cheron Schaumann K, PA-C  metoprolol (LOPRESSOR) 50 MG tablet Take 50 mg by mouth 2 (two) times daily.   Yes [provider]  ondansetron (ZOFRAN) 4 MG tablet Take 1 tablet (4 mg total) by mouth every 6 (six) hours. 12/04/20  Yes Couture, Cortni S, PA-C  oxyCODONE-acetaminophen (PERCOCET/ROXICET) 5-325 MG tablet Take  1 tablet by mouth every 8 (eight) hours as needed for severe pain. 12/18/19  Yes Farrel Gordon, PA-C  tamsulosin (FLOMAX) 0.4 MG CAPS capsule Take 1 capsule (0.4 mg total) by mouth daily. 02/04/19  Yes Mesner, Barbara Cower, MD  allopurinol (ZYLOPRIM) 100 MG tablet Take 1 tablet by mouth 2 (two) times daily. Patient not taking: Reported on 12/09/2020 06/06/19   [provider]  baclofen (LIORESAL) 10 MG tablet Take 10 mg by mouth 3 (three) times daily. Patient not taking: Reported on 12/09/2020    [provider]  ciprofloxacin-dexamethasone (CIPRODEX) OTIC suspension 4 drops into affected ear Patient not taking: No sig reported 01/02/20   [provider]  meloxicam (MOBIC) 7.5 MG tablet Take 7.5 mg by mouth daily. Patient not taking: Reported on 12/09/2020 06/14/19   [provider]  ondansetron (ZOFRAN ODT) 4 MG disintegrating tablet Take 1 tablet (4 mg total) by mouth every 8 (eight) hours as needed for nausea or vomiting. Patient not taking: No sig reported 12/18/19   Farrel Gordon, PA-C  oxyCODONE-acetaminophen (PERCOCET/ROXICET) 5-325 MG tablet Take 1 tablet by mouth every 6 (six) hours as needed for severe pain. Patient not taking: No sig reported 12/04/20   Couture, Cortni S, PA-C  pantoprazole (PROTONIX) 40 MG tablet Take 1 tablet (40 mg total) by mouth daily. Patient not taking: No sig reported 03/20/20   Triplett, Tammy, PA-C  tamsulosin (FLOMAX) 0.4 MG CAPS capsule Take 1 capsule (0.4 mg total) by mouth daily. Patient not taking: No sig reported 12/04/20 01/03/21  Couture, Cortni S, PA-C    Allergies    Patient has no known allergies.  Review of Systems   Review of Systems  Gastrointestinal:  Positive for abdominal pain.  Genitourinary:  Positive for flank pain.  All other systems reviewed and are negative.  Physical Exam Updated Vital Signs BP 139/83   Pulse 88   Temp 98 F (36.7 C)   Resp 20   Ht 5\' 9"  (1.753 m)   Wt 131.5 kg   SpO2 96%   BMI  42.83 kg/m   Physical Exam Vitals and nursing note reviewed.  Constitutional:      Appearance: He is well-developed.  HENT:     Head: Normocephalic and atraumatic.  Eyes:     Conjunctiva/sclera: Conjunctivae normal.  Cardiovascular:     Rate and Rhythm: Normal rate and regular rhythm.     Heart sounds: No murmur heard. Pulmonary:     Effort: Pulmonary effort is normal. No respiratory distress.     Breath sounds: Normal breath sounds.  Abdominal:     Palpations: Abdomen is soft.     Tenderness: There is no abdominal tenderness.  Musculoskeletal:     Cervical back: Neck supple.  Skin:    General: Skin is warm and  dry.  Neurological:     Mental Status: He is alert.    ED Results / Procedures / Treatments   Labs (all labs ordered are listed, but only abnormal results are displayed) Labs Reviewed  URINALYSIS, ROUTINE W REFLEX MICROSCOPIC - Abnormal; Notable for the following components:      Result Value   APPearance HAZY (*)    Hgb urine dipstick LARGE (*)    Protein, ur 30 (*)    RBC / HPF >50 (*)    All other components within normal limits    EKG None  Radiology No results found.  Procedures Procedures   Medications Ordered in ED Medications  ketorolac (TORADOL) injection 60 mg (60 mg Intramuscular Given 12/09/20 2042)  HYDROmorphone (DILAUDID) injection 1 mg (1 mg Intramuscular Given 12/09/20 2047)  ondansetron (ZOFRAN-ODT) disintegrating tablet 4 mg (4 mg Oral Given 12/09/20 2041)    ED Course  I have reviewed the triage vital signs and the nursing notes.  Pertinent labs & imaging results that were available during my care of the patient were reviewed by me and considered in my medical decision making (see chart for details).    MDM Rules/Calculators/A&P                           Pain improved with dilaudid and toradol.  Pt given rx for 10 dilaudid tablets.  Pt advised to call urology tomorrow to schedule evaluation  Final Clinical Impression(s) / ED  Diagnoses Final diagnoses:  Left flank pain    Rx / DC Orders ED Discharge Orders          Ordered    ibuprofen (ADVIL) 800 MG tablet  Every 8 hours PRN        12/09/20 2229    HYDROmorphone (DILAUDID) 2 MG tablet  Every 12 hours PRN        12/09/20 2229          An After Visit Summary was printed and given to the patient.    Osie Cheeks 12/09/20 2243    Cheryll Cockayne, MD 12/25/20 [phone]

## 2020-12-09 NOTE — ED Triage Notes (Addendum)
Pt was diagnosis with kidney stone last week. C/o ongoing left sided flank  pain. Has been unable to follow up with urology. Continues to experience hematuria and states having decreased urinary output. "Feels like I have to force to pee."

## 2020-12-09 NOTE — Discharge Instructions (Signed)
Call Urology to schedule to be seen for evaluation

## 2021-02-23 DIAGNOSIS — I10 Essential (primary) hypertension: Secondary | ICD-10-CM | POA: Insufficient documentation

## 2021-02-24 ENCOUNTER — Other Ambulatory Visit: Payer: Self-pay

## 2021-02-24 ENCOUNTER — Encounter: Payer: Self-pay | Admitting: Emergency Medicine

## 2021-02-24 ENCOUNTER — Emergency Department
Admission: EM | Admit: 2021-02-24 | Discharge: 2021-02-24 | Disposition: A | Discharge status: Home / Self Care | Payer: Medicaid Other | Attending: Emergency Medicine | Admitting: Emergency Medicine

## 2021-02-24 ENCOUNTER — Other Ambulatory Visit: Payer: Self-pay | Admitting: Family

## 2021-02-24 DIAGNOSIS — I10 Essential (primary) hypertension: Secondary | ICD-10-CM | POA: Insufficient documentation

## 2021-02-24 DIAGNOSIS — K0889 Other specified disorders of teeth and supporting structures: Secondary | ICD-10-CM | POA: Insufficient documentation

## 2021-02-24 DIAGNOSIS — Z79899 Other long term (current) drug therapy: Secondary | ICD-10-CM | POA: Insufficient documentation

## 2021-02-24 DIAGNOSIS — Z96643 Presence of artificial hip joint, bilateral: Secondary | ICD-10-CM | POA: Insufficient documentation

## 2021-02-24 DIAGNOSIS — F17210 Nicotine dependence, cigarettes, uncomplicated: Secondary | ICD-10-CM | POA: Insufficient documentation

## 2021-02-24 MED ORDER — NAPROXEN 500 MG PO TABS: 500.0000 mg | ORAL_TABLET | Freq: Two times a day (BID) | ORAL | 2 refills | Status: DC

## 2021-02-24 MED ORDER — OXYCODONE-ACETAMINOPHEN 5-325 MG PO TABS
2.0000 | ORAL_TABLET | Freq: Once | ORAL | Status: AC
  Administered 2021-02-24: 2 via ORAL
  Filled 2021-02-24: qty 2

## 2021-02-24 MED ORDER — LIDOCAINE VISCOUS HCL 2 % MT SOLN: 15.0000 mL | OROMUCOSAL | 0 refills | Status: DC | PRN

## 2021-02-24 MED ORDER — PENICILLIN V POTASSIUM 250 MG PO TABS: 250.0000 mg | ORAL_TABLET | Freq: Four times a day (QID) | ORAL | 0 refills | Status: DC

## 2021-02-24 NOTE — ED Provider Notes (Signed)
[facility] Emergency Department Provider Note   ____________________________________________    I have reviewed the triage vital signs and the nursing notes.   HISTORY  Chief Complaint Dental Pain     HPI Jorge Nichols is a 30 y.o. male who presents with complaints of dental pain.  Patient reports that he had a tooth pulledAbout 1 to 2 weeks ago and is still having pain in that area.  Denies fevers or chills.  No intraoral swelling reported.  No difficulty swallowing.,  Has not followed up with dentist  Past Medical History:  Diagnosis Date   Anxiety    Chronic headaches    Chronic hip pain    Hematuria    History of avascular necrosis of capital femoral epiphysis    bilateral hip s/p  replacement's   Hypertension    Mixed dyslipidemia    Nephrolithiasis    bilateral  non-obstuctive per ct 12-25-2015   Other hyperparathyroidism ([place])    Right ureteral stone    Vitamin D deficiency    Weak urinary stream     Patient Active Problem List   Diagnosis Date Noted   Chronic pain syndrome 06/01/2019   Mesenteric panniculitis ([place]) 03/30/2018   Vitamin D deficiency 06/21/2015   Other hyperparathyroidism ([place]) 06/05/2015   BP (high blood pressure) 05/13/2015   Calculus of kidney 05/13/2015   Arthralgia of hip 12/04/2013   Iliopsoas bursitis 12/04/2013   History of total replacement of both hip joints 07/03/2013   Avascular necrosis of bone ([place]) 03/20/2013   Mixed dyslipidemia XX123456   Metabolic syndrome [id number]   Morbid obesity ([place]) 09/09/2012   Sleep disorder breathing 09/09/2012   Family history of premature CAD 09/09/2012    Past Surgical History:  Procedure Laterality Date   CYSTOSCOPY WITH URETEROSCOPY, STONE BASKETRY AND STENT PLACEMENT Right 01/02/2016   Procedure: CYSTOSCOPY WITH RIGHT URETEROSCOPY, STONE EXTRACTION  AND STENT PLACEMENT;  Surgeon: Irine Seal, MD;  Location: [facility];  Service: Urology;   Laterality: Right;   EXTRACORPOREAL SHOCK WAVE LITHOTRIPSY  yrs ago   HIP SURGERY Bilateral 2015   HOLMIUM LASER APPLICATION Right [id number]   Procedure: HOLMIUM LASER APPLICATION;  Surgeon: Irine Seal, MD;  Location: [facility];  Service: Urology;  Laterality: Right;   TOTAL HIP ARTHROPLASTY Bilateral 2015   URETEROLITHOTOMY  x2  last one 2007    Prior to Admission medications   Medication Sig Start Date End Date Taking? Authorizing Provider  naproxen (NAPROSYN) 500 MG tablet Take 1 tablet (500 mg total) by mouth 2 (two) times daily with a meal. 02/24/21  Yes Lavonia Drafts, MD  penicillin v potassium (VEETID) 250 MG tablet Take 1 tablet (250 mg total) by mouth 4 (four) times daily. 02/24/21  Yes Lavonia Drafts, MD  allopurinol (ZYLOPRIM) 100 MG tablet Take 1 tablet by mouth 2 (two) times daily. Patient not taking: Reported on 12/09/2020 06/06/19   [provider]  baclofen (LIORESAL) 10 MG tablet Take 10 mg by mouth 3 (three) times daily. Patient not taking: Reported on 12/09/2020    [provider]  ciprofloxacin-dexamethasone (CIPRODEX) OTIC suspension 4 drops into affected ear Patient not taking: No sig reported 01/02/20   [provider]  cyclobenzaprine (FLEXERIL) 10 MG tablet 1 tablet at bedtime as needed 01/02/20   [provider]  ibuprofen (ADVIL) 800 MG tablet Take 1 tablet (800 mg total) by mouth every 8 (eight) hours as needed. 12/09/20   Caryl Ada  K, PA-C  meloxicam (MOBIC) 7.5 MG tablet Take 7.5 mg by mouth daily. Patient not taking: Reported on 12/09/2020 06/14/19   [provider]  metoprolol (LOPRESSOR) 50 MG tablet Take 50 mg by mouth 2 (two) times daily.    [provider]  ondansetron (ZOFRAN ODT) 4 MG disintegrating tablet Take 1 tablet (4 mg total) by mouth every 8 (eight) hours as needed for nausea or vomiting. Patient not taking: No sig reported 12/18/19   Alfredia Client, PA-C  ondansetron (ZOFRAN) 4  MG tablet Take 1 tablet (4 mg total) by mouth every 6 (six) hours. 12/04/20   Couture, Cortni S, PA-C  oxyCODONE-acetaminophen (PERCOCET/ROXICET) 5-325 MG tablet Take 1 tablet by mouth every 8 (eight) hours as needed for severe pain. 12/18/19   Alfredia Client, PA-C  oxyCODONE-acetaminophen (PERCOCET/ROXICET) 5-325 MG tablet Take 1 tablet by mouth every 6 (six) hours as needed for severe pain. Patient not taking: No sig reported 12/04/20   Couture, Cortni S, PA-C  pantoprazole (PROTONIX) 40 MG tablet Take 1 tablet (40 mg total) by mouth daily. Patient not taking: No sig reported 03/20/20   Triplett, Tammy, PA-C  tamsulosin (FLOMAX) 0.4 MG CAPS capsule Take 1 capsule (0.4 mg total) by mouth daily. 02/04/19   Mesner, Corene Cornea, MD     Allergies Patient has no known allergies.  Family History  Problem Relation Age of Onset   Hypertension Father     Social History Social History   Tobacco Use   Smoking status: Every Day    Packs/day: 0.50    Years: 15.00    Pack years: 7.50    Types: Cigarettes   Smokeless tobacco: Current    Types: Snuff  Vaping Use   Vaping Use: Never used  Substance Use Topics   Alcohol use: Yes    Comment: occassionally   Drug use: No    Review of Systems  Constitutional: No fever/chills  ENT: As above    Musculoskeletal: Negative for back pain. Skin: Negative for rash. Neurological: Negative for headaches     ____________________________________________   PHYSICAL EXAM:  VITAL SIGNS: ED Triage Vitals  Enc Vitals Group     BP 02/24/21 1217 (!) 158/97     Pulse Rate 02/24/21 1217 84     Resp 02/24/21 1217 20     Temp 02/24/21 1217 97.7 F (36.5 C)     Temp Source 02/24/21 1217 Oral     SpO2 02/24/21 1217 100 %     Weight 02/24/21 1218 127 kg (280 lb)     Height 02/24/21 1218 1.753 m (5\' 9" )     Head Circumference --      Peak Flow --      Pain Score 02/24/21 1218 9     Pain Loc --      Pain Edu? --      Excl. in [place]? --       Constitutional: Alert and oriented. No acute distress. Pleasant and interactive Eyes: Conjunctivae are normal.  Head: Atraumatic. Nose: No congestion/rhinnorhea. Mouth/Throat: Mucous membranes are moist.  No abscess, no swelling or induration of the floor of the mouth, pharynx normal.  Poor dentition no evidence of abscess Cardiovascular: Normal rate, regular rhythm.  Respiratory: Normal respiratory effort.  No retractions. Genitourinary: deferred Musculoskeletal: No lower extremity tenderness nor edema.   Neurologic:  Normal speech and language. No gross focal neurologic deficits are appreciated.   Skin:  Skin is warm, dry and intact. No rash noted.   ____________________________________________  LABS (all labs ordered are listed, but only abnormal results are displayed)  Labs Reviewed - No data to display ____________________________________________  EKG   ____________________________________________  RADIOLOGY   ____________________________________________   PROCEDURES  Procedure(s) performed: No  Procedures   Critical Care performed: No ____________________________________________   INITIAL IMPRESSION / ASSESSMENT AND PLAN / ED COURSE  Pertinent labs & imaging results that were available during my care of the patient were reviewed by me and considered in my medical decision making (see chart for details).   Patient with continued dental pain at site of extraction, recommend follow-up with dentist, analgesics provided   ____________________________________________   FINAL CLINICAL IMPRESSION(S) / ED DIAGNOSES  Final diagnoses:  Pain, dental      NEW MEDICATIONS STARTED DURING THIS VISIT:  Discharge Medication List as of 02/24/2021  1:48 PM     START taking these medications   Details  naproxen (NAPROSYN) 500 MG tablet Take 1 tablet (500 mg total) by mouth 2 (two) times daily with a meal., Starting Mon 02/24/2021, Normal    penicillin v  potassium (VEETID) 250 MG tablet Take 1 tablet (250 mg total) by mouth 4 (four) times daily., Starting Mon 02/24/2021, Normal         Note:  This document was prepared using Dragon voice recognition software and may include unintentional dictation errors.    Jene Every, MD 02/24/21 [phone]

## 2021-02-24 NOTE — ED Triage Notes (Signed)
Pt via POV from home. Pt had a tooth pulled on Wednesday and since then he has had pain. Denies any fevers or chills.  Pt states the pain radiates to his jaw. Pt is A&OX4 and NAD.

## 2021-02-24 NOTE — ED Provider Notes (Signed)
  Emergency Medicine Provider Triage Evaluation Note  Jorge Nichols , a 30 y.o.male,  was evaluated in triage.  Pt complains of dental pain.  Patient states that he had a tooth pulled on Wednesday for root canal of his left lower incisor.  Since then he has had significant pain in his tooth and left side of his jaw.  Denies any fever/chills.  Patient has been taking ibuprofen at home for the pain but is not helping.  Denies chest pain, SOB, abdominal pain, or nausea/vomiting.   Review of Systems  Positive: Dental pain Negative: Denies fever, chest pain, vomiting  Physical Exam   Vitals:   02/24/21 1217  BP: (!) 158/97  Pulse: 84  Resp: 20  Temp: 97.7 F (36.5 C)  SpO2: 100%   Gen:   Awake, appears uncomfortable. Resp:  Normal effort  MSK:   Moves extremities without difficulty  Other:    Medical Decision Making  Given the patient's initial medical screening exam, the following diagnostic evaluation has been ordered. The patient will be placed in the appropriate treatment space, once one is available, to complete the evaluation and treatment. I have discussed the plan of care with the patient and I have advised the patient that an ED physician or mid-level practitioner will reevaluate their condition after the test results have been received, as the results may give them additional insight into the type of treatment they may need.    Diagnostics: None immediately.  Treatments: Percocet.   Varney Daily, [place] 02/24/21 1224    Jene Every, MD 02/24/21 1308

## 2021-07-07 ENCOUNTER — Encounter (HOSPITAL_COMMUNITY): Payer: Self-pay

## 2021-07-07 ENCOUNTER — Emergency Department (HOSPITAL_COMMUNITY): Payer: Self-pay

## 2021-07-07 ENCOUNTER — Emergency Department (HOSPITAL_COMMUNITY)
Admission: EM | Admit: 2021-07-07 | Discharge: 2021-07-07 | Disposition: A | Discharge status: Home / Self Care | Payer: Self-pay | Attending: Emergency Medicine | Admitting: Emergency Medicine

## 2021-07-07 ENCOUNTER — Other Ambulatory Visit: Payer: Self-pay

## 2021-07-07 DIAGNOSIS — I10 Essential (primary) hypertension: Secondary | ICD-10-CM | POA: Insufficient documentation

## 2021-07-07 DIAGNOSIS — N201 Calculus of ureter: Secondary | ICD-10-CM | POA: Insufficient documentation

## 2021-07-07 LAB — BASIC METABOLIC PANEL
Anion gap: 10 (ref 5–15)
BUN: 6 mg/dL (ref 6–20)
CO2: 27 mmol/L (ref 22–32)
Calcium: 9.6 mg/dL (ref 8.9–10.3)
Chloride: 105 mmol/L (ref 98–111)
Creatinine, Ser: 0.87 mg/dL (ref 0.61–1.24)
GFR, Estimated: >60 mL/min (ref >60)
Glucose, Bld: 103 mg/dL — ABNORMAL HIGH (ref 70–99)
Potassium: 4.2 mmol/L (ref 3.5–5.1)
Sodium: 142 mmol/L (ref 135–145)

## 2021-07-07 LAB — URINALYSIS, ROUTINE W REFLEX MICROSCOPIC
Bacteria, UA: NONE SEEN
Bilirubin Urine: NEGATIVE
Glucose, UA: NEGATIVE mg/dL
Ketones, ur: NEGATIVE mg/dL
Leukocytes,Ua: NEGATIVE
Nitrite: NEGATIVE
Protein, ur: 30 mg/dL — AB
RBC / HPF: >50 RBC/hpf — ABNORMAL HIGH (ref 0–5)
Specific Gravity, Urine: 1.019 (ref 1.005–1.030)
pH: 7 (ref 5.0–8.0)

## 2021-07-07 LAB — CBC WITH DIFFERENTIAL/PLATELET
Abs Immature Granulocytes: 0.01 10*3/uL (ref 0.00–0.07)
Basophils Absolute: 0.1 10*3/uL (ref 0.0–0.1)
Basophils Relative: 1 %
Eosinophils Absolute: 0.2 10*3/uL (ref 0.0–0.5)
Eosinophils Relative: 3 %
HCT: 49.9 % (ref 39.0–52.0)
Hemoglobin: 17.1 g/dL — ABNORMAL HIGH (ref 13.0–17.0)
Immature Granulocytes: 0 %
Lymphocytes Relative: 28 %
Lymphs Abs: 2.4 10*3/uL (ref 0.7–4.0)
MCH: 33.1 pg (ref 26.0–34.0)
MCHC: 34.3 g/dL (ref 30.0–36.0)
MCV: 96.5 fL (ref 80.0–100.0)
Monocytes Absolute: 0.6 10*3/uL (ref 0.1–1.0)
Monocytes Relative: 7 %
Neutro Abs: 5.3 10*3/uL (ref 1.7–7.7)
Neutrophils Relative %: 61 %
Platelets: 352 10*3/uL (ref 150–400)
RBC: 5.17 MIL/uL (ref 4.22–5.81)
RDW: 12.9 % (ref 11.5–15.5)
WBC: 8.7 10*3/uL (ref 4.0–10.5)
nRBC: 0 % (ref 0.0–0.2)

## 2021-07-07 MED ORDER — OXYCODONE-ACETAMINOPHEN 5-325 MG PO TABS: 1.0000 | ORAL_TABLET | ORAL | 0 refills | Status: DC | PRN

## 2021-07-07 MED ORDER — ONDANSETRON HCL 4 MG/2ML IJ SOLN
4.0000 mg | Freq: Once | INTRAMUSCULAR | Status: AC
Start: 2021-07-07 — End: 2021-07-07
  Administered 2021-07-07: 4 mg via INTRAVENOUS
  Filled 2021-07-07: qty 2

## 2021-07-07 MED ORDER — KETOROLAC TROMETHAMINE 30 MG/ML IJ SOLN
30.0000 mg | Freq: Once | INTRAMUSCULAR | Status: AC
Start: 2021-07-07 — End: 2021-07-07
  Administered 2021-07-07: 30 mg via INTRAVENOUS
  Filled 2021-07-07: qty 1

## 2021-07-07 MED ORDER — TAMSULOSIN HCL 0.4 MG PO CAPS: 0.4000 mg | ORAL_CAPSULE | Freq: Every day | ORAL | 0 refills | Status: DC

## 2021-07-07 MED ORDER — HYDROMORPHONE HCL 1 MG/ML IJ SOLN
1.0000 mg | Freq: Once | INTRAMUSCULAR | Status: AC
  Administered 2021-07-07: 1 mg via INTRAVENOUS
  Filled 2021-07-07: qty 1

## 2021-07-07 MED ORDER — ONDANSETRON HCL 4 MG PO TABS: 4.0000 mg | ORAL_TABLET | Freq: Four times a day (QID) | ORAL | 0 refills | Status: DC

## 2021-07-07 NOTE — ED Triage Notes (Signed)
Pt reports started having pain in left flank a few days ago and has since moved into testicles.  Reports history of kidney stones.  Reports nausea/vomiting since this morning.  ?

## 2021-07-07 NOTE — ED Provider Notes (Signed)
?[name] ?Provider Note ? ? ?CSN: [id number] ?Arrival date & time: 07/07/21  [id number] ? ?  ? ?History ? ?Chief Complaint  ?Patient presents with  ? Testicle Pain  ? ? ?Jorge Nichols is a 31 y.o. male. ? ? ?Testicle Pain ?Pertinent negatives include no chest pain, no headaches and no shortness of breath.  ? ?  ? ?Jorge Nichols is a 31 y.o. male with past medical history of hypertension, prior kidney stones, anxiety who presents to the Emergency Department complaining of gradually worsening left flank pain.  Pain has been present for several days.  This morning, pain worsened and now radiating into his left groin and testicle.  Pain has been associated with nausea and vomiting.  He also endorses having some urinary hesitancy.  States his current pain feels similar to prior kidney stones.  He denies any injury, swelling or rash to his genitals.  No fever or chills.   ? ? ?Home Medications ?Prior to Admission medications   ?Medication Sig Start Date End Date Taking? Authorizing Provider  ?allopurinol (ZYLOPRIM) 100 MG tablet Take 1 tablet by mouth 2 (two) times daily. ?Patient not taking: Reported on 12/09/2020 06/06/19   [provider]  ?baclofen (LIORESAL) 10 MG tablet Take 10 mg by mouth 3 (three) times daily. ?Patient not taking: Reported on 12/09/2020    [provider]  ?ciprofloxacin-dexamethasone (CIPRODEX) OTIC suspension 4 drops into affected ear ?Patient not taking: No sig reported 01/02/20   [provider]  ?cyclobenzaprine (FLEXERIL) 10 MG tablet 1 tablet at bedtime as needed 01/02/20   [provider]  ?ibuprofen (ADVIL) 800 MG tablet Take 1 tablet (800 mg total) by mouth every 8 (eight) hours as needed. 12/09/20   Elson AreasSofia, Leslie K, PA-C  ?lidocaine (XYLOCAINE) 2 % solution Use as directed 15 mLs in the mouth or throat as needed for mouth pain. 02/24/21   Junie SpencerHawks, Christy A, FNP  ?meloxicam (MOBIC) 7.5 MG tablet Take 7.5 mg by mouth daily. ?Patient not  taking: Reported on 12/09/2020 06/14/19   [provider]  ?metoprolol (LOPRESSOR) 50 MG tablet Take 50 mg by mouth 2 (two) times daily.    [provider]  ?naproxen (NAPROSYN) 500 MG tablet Take 1 tablet (500 mg total) by mouth 2 (two) times daily with a meal. 02/24/21   Jene EveryKinner, Robert, MD  ?ondansetron (ZOFRAN ODT) 4 MG disintegrating tablet Take 1 tablet (4 mg total) by mouth every 8 (eight) hours as needed for nausea or vomiting. ?Patient not taking: No sig reported 12/18/19   Farrel GordonPatel, Shalyn, PA-C  ?ondansetron (ZOFRAN) 4 MG tablet Take 1 tablet (4 mg total) by mouth every 6 (six) hours. 12/04/20   Couture, Cortni S, PA-C  ?oxyCODONE-acetaminophen (PERCOCET/ROXICET) 5-325 MG tablet Take 1 tablet by mouth every 8 (eight) hours as needed for severe pain. 12/18/19   Farrel GordonPatel, Shalyn, PA-C  ?oxyCODONE-acetaminophen (PERCOCET/ROXICET) 5-325 MG tablet Take 1 tablet by mouth every 6 (six) hours as needed for severe pain. ?Patient not taking: No sig reported 12/04/20   Couture, Cortni S, PA-C  ?pantoprazole (PROTONIX) 40 MG tablet Take 1 tablet (40 mg total) by mouth daily. ?Patient not taking: No sig reported 03/20/20   Pauline Ausriplett, Tyra Gural, PA-C  ?penicillin v potassium (VEETID) 250 MG tablet Take 1 tablet (250 mg total) by mouth 4 (four) times daily. 02/24/21   Jene EveryKinner, Robert, MD  ?tamsulosin (FLOMAX) 0.4 MG CAPS capsule Take 1 capsule (0.4 mg total) by mouth daily. 02/04/19  Mesner, Barbara Cower, MD  ?   ? ?Allergies    ?Patient has no known allergies.   ? ?Review of Systems   ?Review of Systems  ?Constitutional:  Negative for chills and fever.  ?Respiratory:  Negative for shortness of breath.   ?Cardiovascular:  Negative for chest pain.  ?Gastrointestinal:  Positive for nausea and vomiting.  ?Genitourinary:  Positive for difficulty urinating, flank pain and testicular pain. Negative for decreased urine volume, hematuria, penile discharge, penile pain, penile swelling and scrotal swelling.  ?Neurological:  Negative for  dizziness and headaches.  ?All other systems reviewed and are negative. ? ?Physical Exam ?Updated Vital Signs ?BP (!) 147/96 (BP Location: Right Arm)   Pulse 82   Temp 97.6 ?F (36.4 ?C) (Oral)   Resp 18   Ht 5\' 9"  (1.753 m)   Wt 122.5 kg   SpO2 100%   BMI 39.87 kg/m?  ?Physical Exam ?Vitals and nursing note reviewed.  ?Constitutional:   ?   Appearance: Normal appearance.  ?   Comments: Patient is uncomfortable appearing.  Nontoxic.  ?Cardiovascular:  ?   Rate and Rhythm: Normal rate and regular rhythm.  ?   Pulses: Normal pulses.  ?Pulmonary:  ?   Effort: Pulmonary effort is normal.  ?   Breath sounds: Normal breath sounds.  ?Abdominal:  ?   General: There is no distension.  ?   Palpations: Abdomen is soft.  ?   Tenderness: There is no abdominal tenderness. There is no right CVA tenderness or left CVA tenderness.  ?Musculoskeletal:     ?   General: Normal range of motion.  ?Skin: ?   General: Skin is warm.  ?   Capillary Refill: Capillary refill takes less than 2 seconds.  ?Neurological:  ?   General: No focal deficit present.  ?   Mental Status: He is alert.  ?   Sensory: No sensory deficit.  ?   Motor: No weakness.  ? ? ?ED Results / Procedures / Treatments   ?Labs ?(all labs ordered are listed, but only abnormal results are displayed) ?Labs Reviewed  ?URINALYSIS, ROUTINE W REFLEX MICROSCOPIC - Abnormal; Notable for the following components:  ?    Result Value  ? Hgb urine dipstick MODERATE (*)   ? Protein, ur 30 (*)   ? RBC / HPF >50 (*)   ? All other components within normal limits  ?BASIC METABOLIC PANEL  ?CBC WITH DIFFERENTIAL/PLATELET  ? ? ?EKG ?None ? ?Radiology ?CT Renal Stone Study ? ?Result Date: 07/07/2021 ?CLINICAL DATA:  Testicular pain times 4-5 days history of renal stones. EXAM: CT ABDOMEN AND PELVIS WITHOUT CONTRAST TECHNIQUE: Multidetector CT imaging of the abdomen and pelvis was performed following the standard protocol without IV contrast. RADIATION DOSE REDUCTION: This exam was performed  according to the departmental dose-optimization program which includes automated exposure control, adjustment of the mA and/or kV according to patient size and/or use of iterative reconstruction technique. COMPARISON:  December 04, 2020 FINDINGS: Lower chest: Bibasilar subsegmental atelectasis. Hepatobiliary: Hepatic steatosis. Gallbladder is unremarkable. No biliary ductal dilation. Pancreas: No pancreatic ductal dilation or evidence of acute inflammation. Spleen: No splenomegaly. Adrenals/Urinary Tract: Bilateral adrenal glands appear normal. Mild left-sided hydroureteronephrosis to the level of 2 stacked stones in the distal ureter just below the pelvic inlet measuring up to 3 mm. Additional bilateral nonobstructive renal stones measuring up to 2 mm. Urinary bladder is unremarkable for degree of distension although evaluation is limited by streak artifact from bilateral hip prostheses. Stomach/Bowel:  No enteric contrast was administered. Stomach is unremarkable for degree of distension. No pathologic dilation of small or large bowel. The appendix and terminal ileum appear normal. No evidence of acute bowel inflammation. Vascular/Lymphatic: Normal caliber abdominal aorta. No pathologically enlarged abdominal or pelvic lymph nodes. Reproductive: Evaluation of prostate glands limited by streak artifact. Other: No significant abdominopelvic free fluid. Musculoskeletal: Bilateral hip arthroplasties. No acute osseous finding. IMPRESSION: 1. Mild left-sided hydroureteronephrosis to the level of 2 stacked stones in the distal ureter just below the pelvic inlet measuring up to 3 mm. 2. Additional bilateral nonobstructive renal stones measuring up to 2 mm. 3. Hepatic steatosis. Electronically Signed   By: Maudry Mayhew M.D.   On: 07/07/2021 13:53   ? ? ?Procedures ?Procedures  ? ? ?Medications Ordered in ED ?Medications  ?ondansetron (ZOFRAN) injection 4 mg (has no administration in time range)  ?HYDROmorphone (DILAUDID)  injection 1 mg (has no administration in time range)  ? ? ?ED Course/ Medical Decision Making/ A&P ?  ?                        ?Medical Decision Making ?Patient here with history of kidney stones, comes in wit

## 2021-07-07 NOTE — Discharge Instructions (Signed)
Your CT scan today shows that you have 2 kidney stones on the left side.  They are likely small enough to pass.  I recommend that you take the medication as directed, please call the urologist office listed to arrange a follow-up appointment in a few days if your pain is not improving.  Return to emergency department for any new or worsening symptoms. ?

## 2021-08-11 ENCOUNTER — Other Ambulatory Visit: Payer: Self-pay

## 2021-08-11 ENCOUNTER — Emergency Department (HOSPITAL_COMMUNITY)
Admission: EM | Admit: 2021-08-11 | Discharge: 2021-08-11 | Disposition: A | Discharge status: Home / Self Care | Payer: Self-pay | Attending: Emergency Medicine | Admitting: Emergency Medicine

## 2021-08-11 ENCOUNTER — Emergency Department (HOSPITAL_COMMUNITY): Payer: Self-pay

## 2021-08-11 ENCOUNTER — Encounter (HOSPITAL_COMMUNITY): Payer: Self-pay

## 2021-08-11 DIAGNOSIS — N201 Calculus of ureter: Secondary | ICD-10-CM

## 2021-08-11 DIAGNOSIS — N132 Hydronephrosis with renal and ureteral calculous obstruction: Secondary | ICD-10-CM | POA: Insufficient documentation

## 2021-08-11 LAB — URINALYSIS, ROUTINE W REFLEX MICROSCOPIC
Bilirubin Urine: NEGATIVE
Glucose, UA: NEGATIVE mg/dL
Ketones, ur: NEGATIVE mg/dL
Leukocytes,Ua: NEGATIVE
Nitrite: NEGATIVE
Protein, ur: NEGATIVE mg/dL
RBC / HPF: >50 RBC/hpf — ABNORMAL HIGH (ref 0–5)
Specific Gravity, Urine: 1.015 (ref 1.005–1.030)
pH: 5 (ref 5.0–8.0)

## 2021-08-11 LAB — CBC
HCT: 49.4 % (ref 39.0–52.0)
Hemoglobin: 16.3 g/dL (ref 13.0–17.0)
MCH: 33.1 pg (ref 26.0–34.0)
MCHC: 33 g/dL (ref 30.0–36.0)
MCV: 100.4 fL — ABNORMAL HIGH (ref 80.0–100.0)
Platelets: 331 10*3/uL (ref 150–400)
RBC: 4.92 MIL/uL (ref 4.22–5.81)
RDW: 11.8 % (ref 11.5–15.5)
WBC: 9.2 10*3/uL (ref 4.0–10.5)
nRBC: 0 % (ref 0.0–0.2)

## 2021-08-11 LAB — BASIC METABOLIC PANEL
Anion gap: 9 (ref 5–15)
BUN: 8 mg/dL (ref 6–20)
CO2: 23 mmol/L (ref 22–32)
Calcium: 8.6 mg/dL — ABNORMAL LOW (ref 8.9–10.3)
Chloride: 107 mmol/L (ref 98–111)
Creatinine, Ser: 0.79 mg/dL (ref 0.61–1.24)
GFR, Estimated: >60 mL/min (ref >60)
Glucose, Bld: 100 mg/dL — ABNORMAL HIGH (ref 70–99)
Potassium: 3.4 mmol/L — ABNORMAL LOW (ref 3.5–5.1)
Sodium: 139 mmol/L (ref 135–145)

## 2021-08-11 MED ORDER — MORPHINE SULFATE (PF) 4 MG/ML IV SOLN
4.0000 mg | INTRAVENOUS | Status: DC | PRN
  Administered 2021-08-11: 4 mg via INTRAVENOUS
  Filled 2021-08-11: qty 1

## 2021-08-11 MED ORDER — KETOROLAC TROMETHAMINE 10 MG PO TABS
10.0000 mg | ORAL_TABLET | Freq: Four times a day (QID) | ORAL | 0 refills | Status: DC | PRN
Start: 2021-08-11 — End: 2021-12-07

## 2021-08-11 MED ORDER — OXYCODONE HCL 5 MG PO TABS
10.0000 mg | ORAL_TABLET | Freq: Once | ORAL | Status: AC
  Administered 2021-08-11: 10 mg via ORAL
  Filled 2021-08-11: qty 2

## 2021-08-11 MED ORDER — SODIUM CHLORIDE 0.9 % IV BOLUS
1000.0000 mL | Freq: Once | INTRAVENOUS | Status: AC
  Administered 2021-08-11: 1000 mL via INTRAVENOUS

## 2021-08-11 MED ORDER — KETOROLAC TROMETHAMINE 15 MG/ML IJ SOLN
15.0000 mg | Freq: Once | INTRAMUSCULAR | Status: AC
  Administered 2021-08-11: 15 mg via INTRAVENOUS
  Filled 2021-08-11: qty 1

## 2021-08-11 MED ORDER — OXYCODONE HCL 5 MG PO TABS: 10.0000 mg | ORAL_TABLET | ORAL | 0 refills | Status: DC | PRN

## 2021-08-11 MED ORDER — TAMSULOSIN HCL 0.4 MG PO CAPS: 0.4000 mg | ORAL_CAPSULE | Freq: Every day | ORAL | 0 refills | Status: DC

## 2021-08-11 MED ORDER — ONDANSETRON HCL 4 MG PO TABS: 4.0000 mg | ORAL_TABLET | Freq: Four times a day (QID) | ORAL | 0 refills | Status: DC

## 2021-08-11 MED ORDER — KETOROLAC TROMETHAMINE 30 MG/ML IJ SOLN
30.0000 mg | Freq: Once | INTRAMUSCULAR | Status: AC
  Administered 2021-08-11: 30 mg via INTRAMUSCULAR
  Filled 2021-08-11: qty 1

## 2021-08-11 MED ORDER — ONDANSETRON HCL 4 MG/2ML IJ SOLN
4.0000 mg | INTRAMUSCULAR | Status: DC | PRN
  Administered 2021-08-11: 4 mg via INTRAVENOUS
  Filled 2021-08-11: qty 2

## 2021-08-11 NOTE — Discharge Instructions (Addendum)
Ambulatory referral sent to [facility] urology clinic for close follow-up.  Flomax, Toradol, narcotics, and antinausea medication sent to your pharmacy.  Please continue to stay hydrated and return to emergency department if unable to urinate.

## 2021-08-11 NOTE — ED Provider Notes (Signed)
[facility] EMERGENCY DEPARTMENT Provider Note   CSN: [id number] Arrival date & time: 08/11/21  1929     History  Chief Complaint  Patient presents with   Nephrolithiasis    Jorge Nichols is a 31 y.o. male.  Patient is a 31 year old male diagnosed with nephrolithiasis presenting for complaints of flank pain.  Patient mitts to left-sided flank pain with radiation to the left abdomen and left groin.  Admits to hematuria.  No decrease in urine.  Admits to nausea without vomiting.  Chart review demonstrates patient was seen on 07/07/2021 with CT renal stone study demonstrating "Mild left-sided hydroureteronephrosis to the level of 2 stacked stones in the distal ureter just below the pelvic inlet measuring up to 3 mm".  Patient attempted to get in with nephrology however has been unable to be seen at this time.  The history is provided by the patient. No language interpreter was used.      Home Medications Prior to Admission medications   Medication Sig Start Date End Date Taking? Authorizing Provider  ketorolac (TORADOL) 10 MG tablet Take 1 tablet (10 mg total) by mouth every 6 (six) hours as needed. 08/11/21  Yes Edwin Dada P, DO  ondansetron (ZOFRAN) 4 MG tablet Take 1 tablet (4 mg total) by mouth every 6 (six) hours. 08/11/21  Yes Edwin Dada P, DO  oxyCODONE (ROXICODONE) 5 MG immediate release tablet Take 2 tablets (10 mg total) by mouth every 4 (four) hours as needed for severe pain. 08/11/21  Yes Edwin Dada P, DO  tamsulosin (FLOMAX) 0.4 MG CAPS capsule Take 1 capsule (0.4 mg total) by mouth daily. 08/11/21  Yes Edwin Dada P, DO  allopurinol (ZYLOPRIM) 100 MG tablet Take 1 tablet by mouth 2 (two) times daily. Patient not taking: Reported on 12/09/2020 06/06/19   [provider]  baclofen (LIORESAL) 10 MG tablet Take 10 mg by mouth 3 (three) times daily. Patient not taking: Reported on 12/09/2020    [provider]  ciprofloxacin-dexamethasone (CIPRODEX) OTIC  suspension 4 drops into affected ear Patient not taking: No sig reported 01/02/20   [provider]  cyclobenzaprine (FLEXERIL) 10 MG tablet 1 tablet at bedtime as needed 01/02/20   [provider]  ibuprofen (ADVIL) 800 MG tablet Take 1 tablet (800 mg total) by mouth every 8 (eight) hours as needed. 12/09/20   Elson Areas, PA-C  lidocaine (XYLOCAINE) 2 % solution Use as directed 15 mLs in the mouth or throat as needed for mouth pain. 02/24/21   Jannifer Rodney A, FNP  meloxicam (MOBIC) 7.5 MG tablet Take 7.5 mg by mouth daily. Patient not taking: Reported on 12/09/2020 06/14/19   [provider]  metoprolol (LOPRESSOR) 50 MG tablet Take 50 mg by mouth 2 (two) times daily.    [provider]  naproxen (NAPROSYN) 500 MG tablet Take 1 tablet (500 mg total) by mouth 2 (two) times daily with a meal. 02/24/21   Jene Every, MD  ondansetron (ZOFRAN ODT) 4 MG disintegrating tablet Take 1 tablet (4 mg total) by mouth every 8 (eight) hours as needed for nausea or vomiting. Patient not taking: No sig reported 12/18/19   Farrel Gordon, PA-C  oxyCODONE-acetaminophen (PERCOCET/ROXICET) 5-325 MG tablet Take 1 tablet by mouth every 4 (four) hours as needed. 07/07/21   Triplett, Tammy, PA-C  pantoprazole (PROTONIX) 40 MG tablet Take 1 tablet (40 mg total) by mouth daily. Patient not taking: No sig reported 03/20/20   Triplett, Tammy, PA-C  penicillin  v potassium (VEETID) 250 MG tablet Take 1 tablet (250 mg total) by mouth 4 (four) times daily. 02/24/21   Jene Every, MD      Allergies    Patient has no known allergies.    Review of Systems   Review of Systems  Constitutional:  Negative for chills and fever.  HENT:  Negative for ear pain and sore throat.   Eyes:  Negative for pain and visual disturbance.  Respiratory:  Negative for cough and shortness of breath.   Cardiovascular:  Negative for chest pain and palpitations.  Gastrointestinal:  Negative for abdominal pain  and vomiting.  Genitourinary:  Positive for flank pain and hematuria. Negative for dysuria.  Musculoskeletal:  Negative for arthralgias and back pain.  Skin:  Negative for color change and rash.  Neurological:  Negative for seizures and syncope.  All other systems reviewed and are negative.  Physical Exam Updated Vital Signs BP (!) 149/91 (BP Location: Right Arm)   Pulse 96   Temp 97.7 F (36.5 C) (Oral)   Resp 20   Ht  (1.753 m)   Wt 127 kg   SpO2 98%   BMI 41.35 kg/m  Physical Exam Vitals and nursing note reviewed.  Constitutional:      General: He is not in acute distress.    Appearance: He is well-developed.  HENT:     Head: Normocephalic and atraumatic.  Eyes:     Conjunctiva/sclera: Conjunctivae normal.  Cardiovascular:     Rate and Rhythm: Normal rate and regular rhythm.     Heart sounds: No murmur heard. Pulmonary:     Effort: Pulmonary effort is normal. No respiratory distress.     Breath sounds: Normal breath sounds.  Abdominal:     Palpations: Abdomen is soft.     Tenderness: There is no abdominal tenderness.  Musculoskeletal:        General: No swelling.     Cervical back: Neck supple.  Skin:    General: Skin is warm and dry.     Capillary Refill: Capillary refill takes less than 2 seconds.  Neurological:     Mental Status: He is alert.  Psychiatric:        Mood and Affect: Mood normal.    ED Results / Procedures / Treatments   Labs (all labs ordered are listed, but only abnormal results are displayed) Labs Reviewed  URINALYSIS, ROUTINE W REFLEX MICROSCOPIC - Abnormal; Notable for the following components:      Result Value   APPearance HAZY (*)    Hgb urine dipstick LARGE (*)    RBC / HPF >50 (*)    Bacteria, UA RARE (*)    All other components within normal limits  BASIC METABOLIC PANEL - Abnormal; Notable for the following components:   Potassium 3.4 (*)    Glucose, Bld 100 (*)    Calcium 8.6 (*)    All other components within normal  limits  CBC - Abnormal; Notable for the following components:   MCV 100.4 (*)    All other components within normal limits    EKG None  Radiology CT Renal Stone Study  Result Date: 08/11/2021 CLINICAL DATA:  Flank pain.  Concern for kidney stone. EXAM: CT ABDOMEN AND PELVIS WITHOUT CONTRAST TECHNIQUE: Multidetector CT imaging of the abdomen and pelvis was performed following the standard protocol without IV contrast. RADIATION DOSE REDUCTION: This exam was performed according to the departmental dose-optimization program which includes automated exposure control, adjustment of the  mA and/or kV according to patient size and/or use of iterative reconstruction technique. COMPARISON:  CT abdomen pelvis dated 07/07/2021. FINDINGS: Evaluation of this exam is limited in the absence of intravenous contrast. Lower chest: The visualized lung bases are clear. No intra-abdominal free air or free fluid. Hepatobiliary: Fatty liver. No intrahepatic biliary dilatation. The gallbladder is unremarkable. Pancreas: Unremarkable. No pancreatic ductal dilatation or surrounding inflammatory changes. Spleen: Normal in size without focal abnormality. Adrenals/Urinary Tract: The adrenal glands unremarkable. There is a string of stones in the distal left ureter with combined length of 1.5 cm. There is mild left hydronephrosis. Several additional nonobstructing bilateral renal calculi measure up to 3 mm. There is no hydronephrosis on the right. The right ureter and urinary bladder appear unremarkable. Stomach/Bowel: Moderate stool throughout the colon. No bowel obstruction or active inflammation. The appendix is normal. Vascular/Lymphatic: The abdominal aorta and IVC are unremarkable. No portal venous gas. There is no adenopathy. Reproductive: The prostate and seminal vesicles are grossly unremarkable. No pelvic mass. Other: None Musculoskeletal: Bilateral total hip arthroplasties. No acute osseous pathology. IMPRESSION: 1. A  string of stones in the distal left ureter with mild left hydronephrosis. Additional nonobstructing bilateral renal calculi. 2. Fatty liver. 3. No bowel obstruction. Normal appendix. Electronically Signed   By: Elgie Collard M.D.   On: 08/11/2021 21:04    Procedures Procedures    Medications Ordered in ED Medications  morphine (PF) 4 MG/ML injection 4 mg (4 mg Intravenous Given 08/11/21 2142)  ondansetron (ZOFRAN) injection 4 mg (has no administration in time range)  ketorolac (TORADOL) 30 MG/ML injection 30 mg (has no administration in time range)  sodium chloride 0.9 % bolus 1,000 mL (1,000 mLs Intravenous New Bag/Given 08/11/21 2141)  ketorolac (TORADOL) 15 MG/ML injection 15 mg (15 mg Intravenous Given 08/11/21 2141)    ED Course/ Medical Decision Making/ A&P                           Medical Decision Making Amount and/or Complexity of Data Reviewed Labs: ordered. Radiology: ordered.  Risk Prescription drug management.   83:50 PM 31 year old male diagnosed with nephrolithiasis presenting for complaints of flank pain.  Is alert and oriented x3, no acute distress, afebrile, stable vital signs.  Physical exam demonstrates soft abdomen with no reproducible tenderness.  Negative Murphy sign.  IV fluids, Toradol, and morphine given for symptomatic management.  Laboratory studies demonstrate stable renal function.  UA demonstrates no UTI.  CT renal stone demonstrates: 1. A string of stones in the distal left ureter with mild left hydronephrosis. Additional nonobstructing bilateral renal calculi.  Oral Toradol, narcotics, Flomax, and Zofran sent to pharmacy for symptomatic management.  Ambulatory referral for urology sent.  Patient in no distress and overall condition improved here in the ED. Detailed discussions were had with the patient regarding current findings, and need for close f/u with urologist.  the patient has been instructed to return immediately if the symptoms worsen in  any way for re-evaluation. Patient verbalized understanding and is in agreement with current care plan. All questions answered prior to discharge.         Final Clinical Impression(s) / ED Diagnoses Final diagnoses:  Ureterolithiasis    Rx / DC Orders ED Discharge Orders          Ordered    ketorolac (TORADOL) 10 MG tablet  Every 6 hours PRN        08/11/21 2245  tamsulosin (FLOMAX) 0.4 MG CAPS capsule  Daily        08/11/21 2245    oxyCODONE (ROXICODONE) 5 MG immediate release tablet  Every 4 hours PRN        08/11/21 2245    Ambulatory referral to Urology        08/11/21 2247    ondansetron (ZOFRAN) 4 MG tablet  Every 6 hours        08/11/21 2249              Franne FortsGray, Keshayla Schrum P, DO 08/11/21 2249

## 2021-08-11 NOTE — ED Triage Notes (Signed)
Pov from home. Cc of left flank pain and kidney stone. Said that it has moved down and hasnt moved in appx 3 days. Has had to have lithotripsy before.

## 2021-08-28 ENCOUNTER — Ambulatory Visit (INDEPENDENT_AMBULATORY_CARE_PROVIDER_SITE_OTHER): Payer: Self-pay | Admitting: Urology

## 2021-08-28 ENCOUNTER — Encounter: Payer: Self-pay | Admitting: Urology

## 2021-08-28 VITALS — BP 158/98 | HR 86 | Ht 69.0 in | Wt 270.0 lb

## 2021-08-28 DIAGNOSIS — N132 Hydronephrosis with renal and ureteral calculous obstruction: Secondary | ICD-10-CM

## 2021-08-28 DIAGNOSIS — N1330 Unspecified hydronephrosis: Secondary | ICD-10-CM

## 2021-08-28 DIAGNOSIS — N201 Calculus of ureter: Secondary | ICD-10-CM

## 2021-08-28 LAB — URINALYSIS, ROUTINE W REFLEX MICROSCOPIC
Bilirubin, UA: NEGATIVE
Glucose, UA: NEGATIVE
Ketones, UA: NEGATIVE
Leukocytes,UA: NEGATIVE
Nitrite, UA: NEGATIVE
Protein,UA: NEGATIVE
Specific Gravity, UA: 1.015 (ref 1.005–1.030)
Urobilinogen, Ur: 0.2 mg/dL (ref 0.2–1.0)
pH, UA: 5.5 (ref 5.0–7.5)

## 2021-08-28 LAB — MICROSCOPIC EXAMINATION
Renal Epithel, UA: NONE SEEN /hpf
WBC, UA: NONE SEEN /hpf (ref 0–5)

## 2021-08-28 MED ORDER — TAMSULOSIN HCL 0.4 MG PO CAPS: 0.4000 mg | ORAL_CAPSULE | Freq: Every day | ORAL | 2 refills | Status: DC

## 2021-08-28 MED ORDER — OXYCODONE HCL 5 MG PO TABS: 10.0000 mg | ORAL_TABLET | ORAL | 0 refills | Status: DC | PRN

## 2021-08-28 NOTE — Progress Notes (Signed)
Subjective: 1. Calculus of ureter   2. Hydronephrosis with urinary obstruction due to ureteral calculus      Consult requested by ER   Jorge Nichols is a former patient with a history of stones who I last saw in 2018.  He returns today with recurrent urolithiasis and he has been trying to pass a stone for the last two month and it was distal on the last CT in May.  He has several left distal stones with a 7x66mm distal lead fragment. His pain has moved to the midline.  He is on tamsulosin.  The pain is intermittent.  He has had some hematuria.    ROS:  Review of Systems  All other systems reviewed and are negative.   No Known Allergies  Past Medical History:  Diagnosis Date   Anxiety    Chronic headaches    Chronic hip pain    Hematuria    History of avascular necrosis of capital femoral epiphysis    bilateral hip s/p  replacement's   Hypertension    Mixed dyslipidemia    Nephrolithiasis    bilateral  non-obstuctive per ct 12-25-2015   Other hyperparathyroidism ([place])    Right ureteral stone    Vitamin D deficiency    Weak urinary stream     Past Surgical History:  Procedure Laterality Date   CYSTOSCOPY WITH URETEROSCOPY, STONE BASKETRY AND STENT PLACEMENT Right 01/02/2016   Procedure: CYSTOSCOPY WITH RIGHT URETEROSCOPY, STONE EXTRACTION  AND STENT PLACEMENT;  Surgeon: Irine Seal, MD;  Location: [facility];  Service: Urology;  Laterality: Right;   EXTRACORPOREAL SHOCK WAVE LITHOTRIPSY  yrs ago   HIP SURGERY Bilateral 2015   HOLMIUM LASER APPLICATION Right [id number]   Procedure: HOLMIUM LASER APPLICATION;  Surgeon: Irine Seal, MD;  Location: [facility];  Service: Urology;  Laterality: Right;   TOTAL HIP ARTHROPLASTY Bilateral 2015   URETEROLITHOTOMY  x2  last one 2007    Social History   Socioeconomic History   Marital status: Married    Spouse name: Not on file   Number of children: Not on file   Years of education: Not on file   Highest  education level: Not on file  Occupational History   Not on file  Tobacco Use   Smoking status: Every Day    Packs/day: 0.50    Years: 15.00    Total pack years: 7.50    Types: Cigarettes   Smokeless tobacco: Current    Types: Snuff  Vaping Use   Vaping Use: Never used  Substance and Sexual Activity   Alcohol use: Yes    Comment: occassionally   Drug use: No   Sexual activity: Not on file  Other Topics Concern   Not on file  Social History Narrative   Not on file   Social Determinants of Health   Financial Resource Strain: Not on file  Food Insecurity: Not on file  Transportation Needs: Not on file  Physical Activity: Not on file  Stress: Not on file  Social Connections: Not on file  Intimate Partner Violence: Not on file    Family History  Problem Relation Age of Onset   Hypertension Father     Anti-infectives: Anti-infectives (From admission, onward)    None       Current Outpatient Medications  Medication Sig Dispense Refill   ketorolac (TORADOL) 10 MG tablet Take 1 tablet (10 mg total) by mouth every 6 (six) hours as needed. 20 tablet 0  ondansetron (ZOFRAN ODT) 4 MG disintegrating tablet Take 1 tablet (4 mg total) by mouth every 8 (eight) hours as needed for nausea or vomiting. 10 tablet 0   ondansetron (ZOFRAN) 4 MG tablet Take 1 tablet (4 mg total) by mouth every 6 (six) hours. 12 tablet 0   oxyCODONE (ROXICODONE) 5 MG immediate release tablet Take 2 tablets (10 mg total) by mouth every 4 (four) hours as needed for severe pain. 15 tablet 0   tamsulosin (FLOMAX) 0.4 MG CAPS capsule Take 1 capsule (0.4 mg total) by mouth daily. 30 capsule 2   No current facility-administered medications for this visit.     Objective: Vital signs in last 24 hours: BP (!) 158/98   Pulse 86   Ht 5\' 9"  (1.753 m)   Wt 270 lb (122.5 kg)   BMI 39.87 kg/m   Intake/Output from previous day: No intake/output data recorded. Intake/Output this  shift: @IOTHISSHIFT @   Physical Exam Vitals reviewed.  Constitutional:      Appearance: Normal appearance.  Neurological:     Mental Status: He is alert.     Lab Results:  Results for orders placed or performed in visit on 08/28/21 (from the past 24 hour(s))  Urinalysis, Routine w reflex microscopic     Status: Abnormal   Collection Time: 08/28/21  4:01 PM  Result Value Ref Range   Specific Gravity, UA 1.015 1.005 - 1.030   pH, UA 5.5 5.0 - 7.5   Color, UA Amber (A) Yellow   Appearance Ur Clear Clear   Leukocytes,UA Negative Negative   Protein,UA Negative Negative/Trace   Glucose, UA Negative Negative   Ketones, UA Negative Negative   RBC, UA 1+ (A) Negative   Bilirubin, UA Negative Negative   Urobilinogen, Ur 0.2 0.2 - 1.0 mg/dL   Nitrite, UA Negative Negative   Microscopic Examination See below:    Narrative   Performed at:  [facility] [address], [place], [place]  [id number] Lab Director: Mina Marble MT, Phone:  [phone]  Microscopic Examination     Status: Abnormal   Collection Time: 08/28/21  4:01 PM   Urine  Result Value Ref Range   WBC, UA None seen 0 - 5 /hpf   RBC 3-10 (A) 0 - 2 /hpf   Epithelial Cells (non renal) 0-10 0 - 10 /hpf   Renal Epithel, UA None seen None seen /hpf   Mucus, UA Present Not Estab.   Bacteria, UA Few None seen/Few   Narrative   Performed at:  [facility] [address] [place], [place]  [id number] Lab Director: [place], Phone:  [phone]    Recent Results (from the past 2160 hour(s))  Urinalysis, Routine w reflex microscopic Urine, Clean Catch     Status: Abnormal   Collection Time: 07/07/21 10:39 AM  Result Value Ref Range   Color, Urine YELLOW YELLOW   APPearance CLEAR CLEAR   Specific Gravity, Urine 1.019 1.005 - 1.030   pH 7.0 5.0 - 8.0   Glucose, UA NEGATIVE NEGATIVE mg/dL   Hgb urine dipstick MODERATE (A) NEGATIVE   Bilirubin Urine NEGATIVE NEGATIVE   Ketones, ur  NEGATIVE NEGATIVE mg/dL   Protein, ur 30 (A) NEGATIVE mg/dL   Nitrite NEGATIVE NEGATIVE   Leukocytes,Ua NEGATIVE NEGATIVE   RBC / HPF >50 (H) 0 - 5 RBC/hpf   WBC, UA 11-20 0 - 5 WBC/hpf   Bacteria, UA NONE SEEN NONE SEEN   Squamous Epithelial /  LPF 0-5 0 - 5   Mucus PRESENT    Hyaline Casts, UA PRESENT     Comment: Performed at [facility], [address], [place], [place] [id number]  Basic metabolic panel     Status: Abnormal   Collection Time: 07/07/21 12:50 PM  Result Value Ref Range   Sodium 142 135 - 145 mmol/L   Potassium 4.2 3.5 - 5.1 mmol/L   Chloride 105 98 - 111 mmol/L   CO2 27 22 - 32 mmol/L   Glucose, Bld 103 (H) 70 - 99 mg/dL    Comment: Glucose reference range applies only to samples taken after fasting for at least 8 hours.   BUN 6 6 - 20 mg/dL   Creatinine, Ser 0.87 0.61 - 1.24 mg/dL   Calcium 9.6 8.9 - 10.3 mg/dL   GFR, Estimated >60 >60 mL/min    Comment: (NOTE) Calculated using the CKD-EPI Creatinine Equation (2021)    Anion gap 10 5 - 15    Comment: Performed at [facility], [address], [place], [place] [address]  CBC with Differential     Status: Abnormal   Collection Time: 07/07/21 12:50 PM  Result Value Ref Range   WBC 8.7 4.0 - 10.5 K/uL   RBC 5.17 4.22 - 5.81 MIL/uL   Hemoglobin 17.1 (H) 13.0 - 17.0 g/dL   HCT 49.9 39.0 - 52.0 %   MCV 96.5 80.0 - 100.0 fL   MCH 33.1 26.0 - 34.0 pg   MCHC 34.3 30.0 - 36.0 g/dL   RDW 12.9 11.5 - 15.5 %   Platelets 352 150 - 400 K/uL   nRBC 0.0 0.0 - 0.2 %   Neutrophils Relative % 61 %   Neutro Abs 5.3 1.7 - 7.7 K/uL   Lymphocytes Relative 28 %   Lymphs Abs 2.4 0.7 - 4.0 K/uL   Monocytes Relative 7 %   Monocytes Absolute 0.6 0.1 - 1.0 K/uL   Eosinophils Relative 3 %   Eosinophils Absolute 0.2 0.0 - 0.5 K/uL   Basophils Relative 1 %   Basophils Absolute 0.1 0.0 - 0.1 K/uL   Immature Granulocytes 0 %   Abs Immature Granulocytes 0.01 0.00 - 0.07 K/uL    Comment: Performed at [facility], [address], [place], [place] [id number]  Basic metabolic panel     Status: Abnormal   Collection Time: 08/11/21  9:24 PM  Result Value Ref Range   Sodium 139 135 - 145 mmol/L   Potassium 3.4 (L) 3.5 - 5.1 mmol/L   Chloride 107 98 - 111 mmol/L   CO2 23 22 - 32 mmol/L   Glucose, Bld 100 (H) 70 - 99 mg/dL    Comment: Glucose reference range applies only to samples taken after fasting for at least 8 hours.   BUN 8 6 - 20 mg/dL   Creatinine, Ser 0.79 0.61 - 1.24 mg/dL   Calcium 8.6 (L) 8.9 - 10.3 mg/dL   GFR, Estimated >60 >60 mL/min    Comment: (NOTE) Calculated using the CKD-EPI Creatinine Equation (2021)    Anion gap 9 5 - 15    Comment: Performed at [facility], [address] [place], [place] [address]  CBC     Status: Abnormal   Collection Time: 08/11/21  9:24 PM  Result Value Ref Range   WBC 9.2 4.0 - 10.5 K/uL   RBC 4.92 4.22 - 5.81 MIL/uL   Hemoglobin 16.3 13.0 - 17.0 g/dL   HCT 49.4 39.0 - 52.0 %  MCV 100.4 (H) 80.0 - 100.0 fL   MCH 33.1 26.0 - 34.0 pg   MCHC 33.0 30.0 - 36.0 g/dL   RDW 35.3 61.4 - 43.1 %   Platelets 331 150 - 400 K/uL   nRBC 0.0 0.0 - 0.2 %    Comment: Performed at [facility], [address], [place], [place] [address]  Urinalysis, Routine w reflex microscopic Urine, Clean Catch     Status: Abnormal   Collection Time: 08/11/21  9:46 PM  Result Value Ref Range   Color, Urine YELLOW YELLOW   APPearance HAZY (A) CLEAR   Specific Gravity, Urine 1.015 1.005 - 1.030   pH 5.0 5.0 - 8.0   Glucose, UA NEGATIVE NEGATIVE mg/dL   Hgb urine dipstick LARGE (A) NEGATIVE   Bilirubin Urine NEGATIVE NEGATIVE   Ketones, ur NEGATIVE NEGATIVE mg/dL   Protein, ur NEGATIVE NEGATIVE mg/dL   Nitrite NEGATIVE NEGATIVE   Leukocytes,Ua NEGATIVE NEGATIVE   RBC / HPF >50 (H) 0 - 5 RBC/hpf   WBC, UA 21-50 0 - 5 WBC/hpf   Bacteria, UA RARE (A) NONE SEEN   Squamous Epithelial / LPF 0-5 0 - 5   Mucus PRESENT    Ca Oxalate Crys, UA PRESENT     Comment: Performed at [facility],  [address] [place], [place] [address]  Urinalysis, Routine w reflex microscopic     Status: Abnormal   Collection Time: 08/28/21  4:01 PM  Result Value Ref Range   Specific Gravity, UA 1.015 1.005 - 1.030   pH, UA 5.5 5.0 - 7.5   Color, UA Amber (A) Yellow   Appearance Ur Clear Clear   Leukocytes,UA Negative Negative   Protein,UA Negative Negative/Trace   Glucose, UA Negative Negative   Ketones, UA Negative Negative   RBC, UA 1+ (A) Negative   Bilirubin, UA Negative Negative   Urobilinogen, Ur 0.2 0.2 - 1.0 mg/dL   Nitrite, UA Negative Negative   Microscopic Examination See below:   Microscopic Examination     Status: Abnormal   Collection Time: 08/28/21  4:01 PM   Urine  Result Value Ref Range   WBC, UA None seen 0 - 5 /hpf   RBC 3-10 (A) 0 - 2 /hpf   Epithelial Cells (non renal) 0-10 0 - 10 /hpf   Renal Epithel, UA None seen None seen /hpf   Mucus, UA Present Not Estab.   Bacteria, UA Few None seen/Few    BMET No results for input(s): "NA", "K", "CL", "CO2", "GLUCOSE", "BUN", "CREATININE", "CALCIUM" in the last 72 hours. PT/INR No results for input(s): "LABPROT", "INR" in the last 72 hours. ABG No results for input(s): "PHART", "HCO3" in the last 72 hours.  Invalid input(s): "PCO2", "PO2"  Studies/Results: No results found. CT Renal Stone Study  Result Date: 08/11/2021 CLINICAL DATA:  Flank pain.  Concern for kidney stone. EXAM: CT ABDOMEN AND PELVIS WITHOUT CONTRAST TECHNIQUE: Multidetector CT imaging of the abdomen and pelvis was performed following the standard protocol without IV contrast. RADIATION DOSE REDUCTION: This exam was performed according to the departmental dose-optimization program which includes automated exposure control, adjustment of the mA and/or kV according to patient size and/or use of iterative reconstruction technique. COMPARISON:  CT abdomen pelvis dated 07/07/2021. FINDINGS: Evaluation of this exam is limited in the absence of intravenous contrast.  Lower chest: The visualized lung bases are clear. No intra-abdominal free air or free fluid. Hepatobiliary: Fatty liver. No intrahepatic biliary dilatation. The gallbladder is unremarkable.  Pancreas: Unremarkable. No pancreatic ductal dilatation or surrounding inflammatory changes. Spleen: Normal in size without focal abnormality. Adrenals/Urinary Tract: The adrenal glands unremarkable. There is a string of stones in the distal left ureter with combined length of 1.5 cm. There is mild left hydronephrosis. Several additional nonobstructing bilateral renal calculi measure up to 3 mm. There is no hydronephrosis on the right. The right ureter and urinary bladder appear unremarkable. Stomach/Bowel: Moderate stool throughout the colon. No bowel obstruction or active inflammation. The appendix is normal. Vascular/Lymphatic: The abdominal aorta and IVC are unremarkable. No portal venous gas. There is no adenopathy. Reproductive: The prostate and seminal vesicles are grossly unremarkable. No pelvic mass. Other: None Musculoskeletal: Bilateral total hip arthroplasties. No acute osseous pathology. IMPRESSION: 1. A string of stones in the distal left ureter with mild left hydronephrosis. Additional nonobstructing bilateral renal calculi. 2. Fatty liver. 3. No bowel obstruction. Normal appendix. Electronically Signed   By: Anner Crete M.D.   On: 08/11/2021 21:04   CT Renal Stone Study  Result Date: 07/07/2021 CLINICAL DATA:  Testicular pain times 4-5 days history of renal stones. EXAM: CT ABDOMEN AND PELVIS WITHOUT CONTRAST TECHNIQUE: Multidetector CT imaging of the abdomen and pelvis was performed following the standard protocol without IV contrast. RADIATION DOSE REDUCTION: This exam was performed according to the departmental dose-optimization program which includes automated exposure control, adjustment of the mA and/or kV according to patient size and/or use of iterative reconstruction technique. COMPARISON:   December 04, 2020 FINDINGS: Lower chest: Bibasilar subsegmental atelectasis. Hepatobiliary: Hepatic steatosis. Gallbladder is unremarkable. No biliary ductal dilation. Pancreas: No pancreatic ductal dilation or evidence of acute inflammation. Spleen: No splenomegaly. Adrenals/Urinary Tract: Bilateral adrenal glands appear normal. Mild left-sided hydroureteronephrosis to the level of 2 stacked stones in the distal ureter just below the pelvic inlet measuring up to 3 mm. Additional bilateral nonobstructive renal stones measuring up to 2 mm. Urinary bladder is unremarkable for degree of distension although evaluation is limited by streak artifact from bilateral hip prostheses. Stomach/Bowel: No enteric contrast was administered. Stomach is unremarkable for degree of distension. No pathologic dilation of small or large bowel. The appendix and terminal ileum appear normal. No evidence of acute bowel inflammation. Vascular/Lymphatic: Normal caliber abdominal aorta. No pathologically enlarged abdominal or pelvic lymph nodes. Reproductive: Evaluation of prostate glands limited by streak artifact. Other: No significant abdominopelvic free fluid. Musculoskeletal: Bilateral hip arthroplasties. No acute osseous finding. IMPRESSION: 1. Mild left-sided hydroureteronephrosis to the level of 2 stacked stones in the distal ureter just below the pelvic inlet measuring up to 3 mm. 2. Additional bilateral nonobstructive renal stones measuring up to 2 mm. 3. Hepatic steatosis. Electronically Signed   By: Dahlia Bailiff M.D.   On: 07/07/2021 13:53     Assessment/Plan: Left distal ureteral stones with hydro on last CT.  The lead stone is 7x77mm but there is about  78mm of stone total.   He has passed a 41mm stone and the pain is now suprapubic.  I will refilled the tamsulosin and oxycodone and have him return in 2 weeks with a KUB.  If he is unable to pass the stones, he will need ureteroscopy.    I have given him information on  litholyte to try 42meq bid as well.   Meds ordered this encounter  Medications   tamsulosin (FLOMAX) 0.4 MG CAPS capsule    Sig: Take 1 capsule (0.4 mg total) by mouth daily.    Dispense:  30 capsule    Refill:  2   oxyCODONE (ROXICODONE) 5 MG immediate release tablet    Sig: Take 2 tablets (10 mg total) by mouth every 4 (four) hours as needed for severe pain.    Dispense:  15 tablet    Refill:  0     Orders Placed This Encounter  Procedures   Microscopic Examination   DG Abd 1 View    Standing Status:   Future    Standing Expiration Date:   12/28/2021    Order Specific Question:   Reason for Exam (SYMPTOM  OR DIAGNOSIS REQUIRED)    Answer:   left ureteral stone    Order Specific Question:   Preferred imaging location?    Answer:   [facility]    Order Specific Question:   Radiology Contrast Protocol - do NOT remove file path    Answer:   [email or web address]   Urinalysis, Routine w reflex microscopic     Return in about 2 weeks (around 09/11/2021) for with KUB.  Could see Sharee Pimple if I am out. .    CC: Campbell Stall DO.     Irine Seal 08/29/2021 [phone]

## 2021-08-29 ENCOUNTER — Encounter: Payer: Self-pay | Admitting: Urology

## 2021-09-03 ENCOUNTER — Emergency Department (HOSPITAL_COMMUNITY)
Admission: EM | Admit: 2021-09-03 | Discharge: 2021-09-03 | Disposition: A | Discharge status: Home / Self Care | Payer: Medicaid Other | Attending: Student | Admitting: Student

## 2021-09-03 ENCOUNTER — Other Ambulatory Visit: Payer: Self-pay

## 2021-09-03 ENCOUNTER — Emergency Department (HOSPITAL_COMMUNITY): Payer: Medicaid Other

## 2021-09-03 ENCOUNTER — Encounter (HOSPITAL_COMMUNITY): Payer: Self-pay | Admitting: *Deleted

## 2021-09-03 DIAGNOSIS — N201 Calculus of ureter: Secondary | ICD-10-CM | POA: Insufficient documentation

## 2021-09-03 LAB — URINALYSIS, ROUTINE W REFLEX MICROSCOPIC
Bilirubin Urine: NEGATIVE
Glucose, UA: NEGATIVE mg/dL
Ketones, ur: 5 mg/dL — AB
Nitrite: NEGATIVE
Protein, ur: 30 mg/dL — AB
RBC / HPF: >50 RBC/hpf — ABNORMAL HIGH (ref 0–5)
Specific Gravity, Urine: 1.024 (ref 1.005–1.030)
pH: 6 (ref 5.0–8.0)

## 2021-09-03 MED ORDER — HYDROMORPHONE HCL 1 MG/ML IJ SOLN
1.0000 mg | Freq: Once | INTRAMUSCULAR | Status: AC
  Administered 2021-09-03: 1 mg via INTRAVENOUS
  Filled 2021-09-03: qty 1

## 2021-09-03 MED ORDER — KETOROLAC TROMETHAMINE 30 MG/ML IJ SOLN
15.0000 mg | Freq: Once | INTRAMUSCULAR | Status: AC
  Administered 2021-09-03: 15 mg via INTRAVENOUS
  Filled 2021-09-03: qty 1

## 2021-09-03 MED ORDER — HYDROMORPHONE HCL 2 MG PO TABS: 2.0000 mg | ORAL_TABLET | ORAL | 0 refills | Status: DC | PRN

## 2021-09-03 MED ORDER — KETOROLAC TROMETHAMINE 30 MG/ML IJ SOLN
15.0000 mg | Freq: Once | INTRAMUSCULAR | Status: DC
  Filled 2021-09-03: qty 1

## 2021-09-03 MED ORDER — ONDANSETRON HCL 4 MG/2ML IJ SOLN
4.0000 mg | Freq: Once | INTRAMUSCULAR | Status: AC
  Administered 2021-09-03: 4 mg via INTRAVENOUS
  Filled 2021-09-03: qty 2

## 2021-09-03 NOTE — ED Triage Notes (Signed)
Pt with left groin pain today and hematuria, hx of kidney stones.

## 2021-09-03 NOTE — ED Provider Notes (Signed)
[facility] EMERGENCY DEPARTMENT Provider Note   CSN: [id number] Arrival date & time: 09/03/21  1352     History  Chief Complaint  Patient presents with   Groin Pain    Jorge Nichols is a 31 y.o. male with a history significant for kidney stones, who was seen here recently with similar complaints, his CT renal stone study completed on May 22 suggested multiple ureteral stones along with bilateral renal stones, all measuring 2 mm meter or less.  However patient states that he has followed up with his urologist Dr. Annabell Howells who he saw last week,  diagnosed him with a left mid ureteral stone measuring 8 mm.  He has been treating his pain with oral Toradol and oxycodone but today his pain became unbearable despite taking oxycodone x 2 prior to arrival.   He states that Dr. Annabell Howells was planning to repeat a plain x-ray next week to determine if the stone has moved, and if not he was planning surgical intervention.  He has been unable to tolerate his pain today which his left-sided, flank into his left groin region.  He also endorses darker than normal urine, he denies fevers or chills, has had nausea without vomiting.  The history is provided by the patient.       Home Medications Prior to Admission medications   Medication Sig Start Date End Date Taking? Authorizing Provider  HYDROmorphone (DILAUDID) 2 MG tablet Take 1 tablet (2 mg total) by mouth every 4 (four) hours as needed for severe pain. 09/03/21  Yes Maudie Shingledecker, Raynelle Fanning, PA-C  ketorolac (TORADOL) 10 MG tablet Take 1 tablet (10 mg total) by mouth every 6 (six) hours as needed. 08/11/21   Edwin Dada P, DO  ondansetron (ZOFRAN ODT) 4 MG disintegrating tablet Take 1 tablet (4 mg total) by mouth every 8 (eight) hours as needed for nausea or vomiting. 12/18/19   Farrel Gordon, PA-C  ondansetron (ZOFRAN) 4 MG tablet Take 1 tablet (4 mg total) by mouth every 6 (six) hours. 08/11/21   Edwin Dada P, DO  oxyCODONE (ROXICODONE) 5 MG immediate release tablet  Take 2 tablets (10 mg total) by mouth every 4 (four) hours as needed for severe pain. 08/28/21   Bjorn Pippin, MD  tamsulosin (FLOMAX) 0.4 MG CAPS capsule Take 1 capsule (0.4 mg total) by mouth daily. 08/28/21   Bjorn Pippin, MD      Allergies    Patient has no known allergies.    Review of Systems   Review of Systems  Constitutional:  Negative for fever.  HENT:  Negative for congestion and sore throat.   Eyes: Negative.   Respiratory:  Negative for chest tightness and shortness of breath.   Cardiovascular:  Negative for chest pain.  Gastrointestinal:  Negative for abdominal pain and nausea.  Genitourinary:  Positive for flank pain, hematuria and testicular pain. Negative for decreased urine volume, dysuria and scrotal swelling.  Musculoskeletal:  Negative for arthralgias, joint swelling and neck pain.  Skin: Negative.  Negative for rash and wound.  Neurological:  Negative for dizziness, weakness, light-headedness, numbness and headaches.  Psychiatric/Behavioral: Negative.      Physical Exam Updated Vital Signs BP (!) 159/107 (BP Location: Right Arm)   Pulse 86   Temp 97.6 F (36.4 C) (Oral)   Resp 16   SpO2 99%  Physical Exam Vitals and nursing note reviewed.  Constitutional:      Appearance: He is well-developed.  HENT:     Head: Normocephalic and atraumatic.  Eyes:     Conjunctiva/sclera: Conjunctivae normal.  Cardiovascular:     Rate and Rhythm: Normal rate and regular rhythm.     Heart sounds: Normal heart sounds.  Pulmonary:     Effort: Pulmonary effort is normal.     Breath sounds: Normal breath sounds. No wheezing.  Abdominal:     General: Bowel sounds are normal.     Palpations: Abdomen is soft.     Tenderness: There is no abdominal tenderness. There is no right CVA tenderness, left CVA tenderness or guarding.  Musculoskeletal:        General: Normal range of motion.     Cervical back: Normal range of motion.  Skin:    General: Skin is warm and dry.   Neurological:     Mental Status: He is alert.     ED Results / Procedures / Treatments   Labs (all labs ordered are listed, but only abnormal results are displayed) Labs Reviewed  URINALYSIS, ROUTINE W REFLEX MICROSCOPIC - Abnormal; Notable for the following components:      Result Value   APPearance HAZY (*)    Hgb urine dipstick LARGE (*)    Ketones, ur 5 (*)    Protein, ur 30 (*)    Leukocytes,Ua TRACE (*)    RBC / HPF >50 (*)    Bacteria, UA RARE (*)    All other components within normal limits    EKG None  Radiology DG Abdomen 1 View  Result Date: 09/03/2021 CLINICAL DATA:  Provided history: Known 8 mm ureteral stone, assess location. EXAM: ABDOMEN - 1 VIEW COMPARISON:  CT abdomen/pelvis 08/11/2021. FINDINGS: Similar to the prior CT abdomen/pelvis of 08/11/2021, there are multiple urinary tract calculi within the distal left ureter (see annotations on image). These measure up to 7 mm. Additional tiny bilateral renal calculi were better appreciated on the prior CT. Nonobstructive bowel gas pattern. No evidence of acute bony abnormality. Prior bilateral total hip arthroplasty. IMPRESSION: Multiple urinary tract calculi within the distal left ureter (measuring up to 7 mm), similar to the prior CT abdomen/pelvis of 08/11/2021. Additional tiny bilateral renal calculi were better appreciated on the prior CT. Electronically Signed   By: Jackey LogeKyle  Golden D.O.   On: 09/03/2021 14:35    Procedures Procedures    Medications Ordered in ED Medications  HYDROmorphone (DILAUDID) injection 1 mg (1 mg Intravenous Given 09/03/21 1418)  ondansetron (ZOFRAN) injection 4 mg (4 mg Intravenous Given 09/03/21 1417)  HYDROmorphone (DILAUDID) injection 1 mg (1 mg Intravenous Given 09/03/21 1525)  ketorolac (TORADOL) 30 MG/ML injection 15 mg (15 mg Intravenous Given 09/03/21 1707)    ED Course/ Medical Decision Making/ A&P                           Medical Decision Making Patient with known frequent  kidney stones, with a current 8 mm stone in his left distal ureter.  He has been tolerating symptoms using OTC Toradol and oxycodone, but today his pain became severe despite doubling the oxycodone.  Original plan was to have a repeat KUB next week and if there is no significant movement in the stone Dr. Annabell HowellsWrenn was going to plan surgical intervention.  Given patient's degree of pain he may benefit from urology intervention today.  He was given 2 separate IV doses of Dilaudid 1 mg each dose and had transient improvement in his pain, after the second dose his pain was 5 out of 10  after he returned from x-ray.  Amount and/or Complexity of Data Reviewed Labs: ordered.    Details: Urinalysis is negative for infection. Radiology: ordered.    Details: KUB indicates no significant change in the position of this 7 mm left ureteral stone and other smaller stones. Discussion of management or test interpretation with external provider(s): Patient's presentation and pain along with prior CT imaging discussed with Dr. Ronne Binning of urology.  He recommended patient going to [facility] if his pain cannot be controlled for discharge home.  He recommends an ED to ED transfer with anticipation of seeing Dr. Retta Diones.  Risk Prescription drug management. Decision regarding hospitalization. Risk Details: Options were discussed with patient, recommending moving over to [place] [facility] to see Dr. Retta Diones for anticipated retrieval of the stones.  Patient was very resistant as he is concerned about incurring a large hospital bill and was very hopeful he would be able to pass the stones without intervention.  He is currently on Flomax.  He is strongly desirous of continuing to try to move the stones at home.  He was given strict return precautions including development of fever, uncontrolled pain or vomiting.  Ideally if he is able, return to [place] [facility] where he will be able to get specialized urology care as the urology  office is closed for the rest of the week here in [place].  He was given a dose of Toradol IV prior to discharge home he was encouraged to continue taking his Flomax.  Review of the chart indicates that patient has been prescribed Dilaudid 2 mg tablets in the past.  He states this medication is better at relieving his pain than the oxycodone, he was prescribed this, advised to hold the oxycodone while he is taking Dilaudid.  Patient understands this plan.  After patient decided to not proceed to [facility], I also messaged Dr. Ronne Binning letting him know of the patient's decision.           Final Clinical Impression(s) / ED Diagnoses Final diagnoses:  Left ureteral stone    Rx / DC Orders ED Discharge Orders          Ordered    HYDROmorphone (DILAUDID) 2 MG tablet  Every 4 hours PRN        09/03/21 1734              Burgess Amor, PA-C 09/04/21 0027    Kommor, Wyn Forster, MD 09/05/21 [phone]

## 2021-09-03 NOTE — Discharge Instructions (Addendum)
Your urinalysis is okay today, no sign of infection but you do have a fair amount of blood in your urine.  You have been prescribed a stronger pain medication to help you as you continue to try passing this kidney stone.  Continue using your Flomax.  As discussed, you are being offered surgical removal of the stone, this can be done at [facility].  If the new pain medicine does not help with your comfort, I recommend returning to [facility] for definitive treatment of this kidney stone.  If you develop fever or uncontrolled vomiting it would be imperative that you proceed to [facility] to meet with the urologist there.  If you have increasing severe pain or new symptoms and are unable to get to [facility], please return here for further treatment.    Do not drive within 4 hours of taking the pain medication prescribed as this will makeDrowsy.

## 2021-09-11 ENCOUNTER — Telehealth: Payer: Self-pay

## 2021-09-11 ENCOUNTER — Ambulatory Visit (INDEPENDENT_AMBULATORY_CARE_PROVIDER_SITE_OTHER): Payer: Medicaid Other | Admitting: Physician Assistant

## 2021-09-11 ENCOUNTER — Ambulatory Visit (HOSPITAL_COMMUNITY)
Admission: RE | Admit: 2021-09-11 | Discharge: 2021-09-11 | Disposition: A | Discharge status: Home / Self Care | Payer: Medicaid Other | Source: Ambulatory Visit | Attending: Urology | Admitting: Urology

## 2021-09-11 ENCOUNTER — Encounter: Payer: Self-pay | Admitting: Physician Assistant

## 2021-09-11 VITALS — BP 144/106 | HR 83

## 2021-09-11 DIAGNOSIS — N201 Calculus of ureter: Secondary | ICD-10-CM

## 2021-09-11 DIAGNOSIS — I10 Essential (primary) hypertension: Secondary | ICD-10-CM

## 2021-09-11 DIAGNOSIS — N132 Hydronephrosis with renal and ureteral calculous obstruction: Secondary | ICD-10-CM

## 2021-09-11 LAB — URINALYSIS, ROUTINE W REFLEX MICROSCOPIC
Bilirubin, UA: NEGATIVE
Glucose, UA: NEGATIVE
Ketones, UA: NEGATIVE
Leukocytes,UA: NEGATIVE
Nitrite, UA: NEGATIVE
RBC, UA: NEGATIVE
Specific Gravity, UA: 1.02 (ref 1.005–1.030)
Urobilinogen, Ur: 1 mg/dL (ref 0.2–1.0)
pH, UA: 6.5 (ref 5.0–7.5)

## 2021-09-11 LAB — MICROSCOPIC EXAMINATION
Bacteria, UA: NONE SEEN
RBC, Urine: NONE SEEN /hpf (ref 0–2)
Renal Epithel, UA: NONE SEEN /hpf
WBC, UA: NONE SEEN /hpf (ref 0–5)

## 2021-09-11 MED ORDER — OXYCODONE-ACETAMINOPHEN 5-325 MG PO TABS: 1.0000 | ORAL_TABLET | Freq: Four times a day (QID) | ORAL | 0 refills | Status: DC | PRN

## 2021-09-11 MED ORDER — ONDANSETRON HCL 4 MG PO TABS: 4.0000 mg | ORAL_TABLET | Freq: Four times a day (QID) | ORAL | 0 refills | Status: DC

## 2021-09-11 NOTE — Telephone Encounter (Signed)
Patient called stating the [facility] did not have prescription and they would not transfer prescription to another pharmacy.  [facility] called and notified to cancel prescription for percocet.  New rx sent to another pharmacy to be filled per PA.

## 2021-09-11 NOTE — Progress Notes (Incomplete)
Assessment: 1. Calculus of ureter - Urinalysis, Routine w reflex microscopic    Plan: -We discussed the management of kidney stones. These options include observation, ureteroscopy, shockwave lithotripsy (ESWL) and percutaneous nephrolithotomy (PCNL). We discussed which options are relevant to the patient's stone(s). We discussed the natural history of kidney stones as well as the complications of untreated stones and the impact on quality of life without treatment as well as with each of the above listed treatments. We also discussed the efficacy of each treatment in its ability to clear the stone burden. With any of these management options I discussed the signs and symptoms of infection and the need for emergent treatment should these be experienced. For each option we discussed the ability of each procedure to clear the patient of their stone burden.   For observation I described the risks which include but are not limited to silent renal damage, life-threatening infection, need for emergent surgery, failure to pass stone and pain.   For ureteroscopy I described the risks which include bleeding, infection, damage to contiguous structures, positioning injury, ureteral stricture, ureteral avulsion, ureteral injury, need for prolonged ureteral stent, inability to perform ureteroscopy, need for an interval procedure, inability to clear stone burden, stent discomfort/pain, heart attack, stroke, pulmonary embolus and the inherent risks with general anesthesia.   For shockwave lithotripsy I described the risks which include arrhythmia, kidney contusion, kidney hemorrhage, need for transfusion, pain, inability to adequately break up stone, inability to pass stone fragments, Steinstrasse, infection associated with obstructing stones, need for alternate surgical procedure, need for repeat shockwave lithotripsy, MI, CVA, PE and the inherent risks with anesthesia/conscious sedation.   For PCNL I described  the risks including positioning injury, pneumothorax, hydrothorax, need for chest tube, inability to clear stone burden, renal laceration, arterial venous fistula or malformation, need for embolization of kidney, loss of kidney or renal function, need for repeat procedure, need for prolonged nephrostomy tube, ureteral avulsion, MI, CVA, PE and the inherent risks of general anesthesia.    The pt elects to proceed with ESWL after his insurance starts on July 1.   Chief Complaint: No chief complaint on file.   HPI: Jorge Nichols is a 31 y.o. male who presents for continued evaluation of left distal ureteral stone.  UA clear  Portions of the above documentation were copied from a prior visit for review purposes only.  Allergies: No Known Allergies  PMH: Past Medical History:  Diagnosis Date   Anxiety    Chronic headaches    Chronic hip pain    Hematuria    History of avascular necrosis of capital femoral epiphysis    bilateral hip s/p  replacement's   Hypertension    Mixed dyslipidemia    Nephrolithiasis    bilateral  non-obstuctive per ct 12-25-2015   Other hyperparathyroidism (HCC)    Right ureteral stone    Vitamin D deficiency    Weak urinary stream     PSH: Past Surgical History:  Procedure Laterality Date   CYSTOSCOPY WITH URETEROSCOPY, STONE BASKETRY AND STENT PLACEMENT Right 01/02/2016   Procedure: CYSTOSCOPY WITH RIGHT URETEROSCOPY, STONE EXTRACTION  AND STENT PLACEMENT;  Surgeon: Bjorn Pippin, MD;  Location: [facility] [address];  Service: Urology;  Laterality: Right;   EXTRACORPOREAL SHOCK WAVE LITHOTRIPSY  yrs ago   HIP SURGERY Bilateral 2015   HOLMIUM LASER APPLICATION Right 01/02/2016   Procedure: HOLMIUM LASER APPLICATION;  Surgeon: Bjorn Pippin, MD;  Location: [facility];  Service: Urology;  Laterality: Right;   TOTAL HIP ARTHROPLASTY Bilateral 2015   URETEROLITHOTOMY  x2  last one 2007    SH: Social History   Tobacco Use   Smoking  status: Every Day    Packs/day: 0.50    Years: 15.00    Total pack years: 7.50    Types: Cigarettes   Smokeless tobacco: Current    Types: Snuff  Vaping Use   Vaping Use: Never used  Substance Use Topics   Alcohol use: Yes    Comment: occassionally   Drug use: No    ROS: All other review of systems were reviewed and are negative except what is noted above in HPI  PE: BP (!) 144/106   Pulse 83  GENERAL APPEARANCE:  Well appearing, well developed, well nourished, appears uncomfy HEENT:  Atraumatic, normocephalic NECK:  Supple. Trachea midline ABDOMEN:  Soft, non-tender, no masses EXTREMITIES:  Moves all extremities well, without clubbing, cyanosis, or edema NEUROLOGIC:  Alert and oriented x 3, normal gait, CN II-XII grossly intact MENTAL STATUS:  appropriate BACK:  Non-tender to palpation, No CVAT SKIN:  Warm, dry, and intact   Results: Laboratory Data: Lab Results  Component Value Date   WBC 9.2 08/11/2021   HGB 16.3 08/11/2021   HCT 49.4 08/11/2021   MCV 100.4 (H) 08/11/2021   PLT 331 08/11/2021    Lab Results  Component Value Date   CREATININE 0.79 08/11/2021    No results found for: "PSA"  No results found for: "TESTOSTERONE"  No results found for: "HGBA1C"  Urinalysis    Component Value Date/Time   COLORURINE YELLOW 09/03/2021 1401   APPEARANCEUR HAZY (A) 09/03/2021 1401   APPEARANCEUR Clear 08/28/2021 1601   LABSPEC 1.024 09/03/2021 1401   LABSPEC 1.018 12/28/2013 2324   PHURINE 6.0 09/03/2021 1401   GLUCOSEU NEGATIVE 09/03/2021 1401   GLUCOSEU Negative 12/28/2013 2324   HGBUR LARGE (A) 09/03/2021 1401   BILIRUBINUR NEGATIVE 09/03/2021 1401   BILIRUBINUR Negative 08/28/2021 1601   BILIRUBINUR Negative 12/28/2013 2324   KETONESUR 5 (A) 09/03/2021 1401   PROTEINUR 30 (A) 09/03/2021 1401   UROBILINOGEN 0.2 11/15/2014 0157   NITRITE NEGATIVE 09/03/2021 1401   LEUKOCYTESUR TRACE (A) 09/03/2021 1401   LEUKOCYTESUR Negative 12/28/2013 2324     Lab Results  Component Value Date   LABMICR See below: 08/28/2021   WBCUA None seen 08/28/2021   LABEPIT 0-10 08/28/2021   MUCUS Present 08/28/2021   BACTERIA RARE (A) 09/03/2021    Pertinent Imaging: Results for orders placed during the hospital encounter of 09/11/21  DG Abd 1 View  Narrative CLINICAL DATA:  Left ureteral stone  EXAM: ABDOMEN - 1 VIEW  COMPARISON:  CT scan Aug 11, 2021.  KUB September 03, 2021.  FINDINGS: Multiple stones are identified in the region of the left distal ureter. The more proximal the stones seen previously have migrated slightly distally. The largest 7 or 8 mm stone is stable in position. Few other tiny stones are located in the kidneys, better appreciated on previous CT imaging. No other abnormalities.  IMPRESSION: 1. Multiple stones remain in the distal left ureter with the largest and most distal measuring 7 or 8 mm. The other stones just proximal to the dominant stone may have migrated slightly inferiorly towards the dominant stone. 2. Renal stones as above.   Electronically Signed By: Dorise Bullion III M.D. On: 09/11/2021 14:16  No results found for this or any previous visit.  No results found for this or any  previous visit.  No results found for this or any previous visit.  Results for orders placed during the hospital encounter of 05/27/19  US Renal  Narrative CLINICAL DATA:  Left flank pain  EXAM: RENAL / URINARY TRACT ULTRASOUND COMPLETE  COMPARISON:  None.  FINDINGS: Right Kidney:  Renal measurements: 11.4 x 4.9 x 5.3 cm = volume: 153 mL . Echogenicity within normal limits. There is a 4 mm nonobstructing calculus at the upper pole. No mass or hydronephrosis visualized.  Left Kidney:  Renal measurements: 11.6 x 5.7 x 5.6 cm = volume: 193 mL. Echogenicity within normal limits. No mass or hydronephrosis visualized.  Bladder:  Appears normal for degree of bladder  distention.  Other:  None.  IMPRESSION: No hydronephrosis. No left renal calculi identified. Small nonobstructing calculus at the right upper pole.   Electronically Signed By: Guadlupe Spanish M.D. On: 05/27/2019 10:25  No results found for this or any previous visit.  No results found for this or any previous visit.  Results for orders placed during the hospital encounter of 08/11/21  CT Renal Stone Study  Narrative CLINICAL DATA:  Flank pain.  Concern for kidney stone.  EXAM: CT ABDOMEN AND PELVIS WITHOUT CONTRAST  TECHNIQUE: Multidetector CT imaging of the abdomen and pelvis was performed following the standard protocol without IV contrast.  RADIATION DOSE REDUCTION: This exam was performed according to the departmental dose-optimization program which includes automated exposure control, adjustment of the mA and/or kV according to patient size and/or use of iterative reconstruction technique.  COMPARISON:  CT abdomen pelvis dated 07/07/2021.  FINDINGS: Evaluation of this exam is limited in the absence of intravenous contrast.  Lower chest: The visualized lung bases are clear.  No intra-abdominal free air or free fluid.  Hepatobiliary: Fatty liver. No intrahepatic biliary dilatation. The gallbladder is unremarkable.  Pancreas: Unremarkable. No pancreatic ductal dilatation or surrounding inflammatory changes.  Spleen: Normal in size without focal abnormality.  Adrenals/Urinary Tract: The adrenal glands unremarkable. There is a string of stones in the distal left ureter with combined length of 1.5 cm. There is mild left hydronephrosis. Several additional nonobstructing bilateral renal calculi measure up to 3 mm. There is no hydronephrosis on the right. The right ureter and urinary bladder appear unremarkable.  Stomach/Bowel: Moderate stool throughout the colon. No bowel obstruction or active inflammation. The appendix is normal.  Vascular/Lymphatic: The  abdominal aorta and IVC are unremarkable. No portal venous gas. There is no adenopathy.  Reproductive: The prostate and seminal vesicles are grossly unremarkable. No pelvic mass.  Other: None  Musculoskeletal: Bilateral total hip arthroplasties. No acute osseous pathology.  IMPRESSION: 1. A string of stones in the distal left ureter with mild left hydronephrosis. Additional nonobstructing bilateral renal calculi. 2. Fatty liver. 3. No bowel obstruction. Normal appendix.   Electronically Signed By: Elgie Collard M.D. On: 08/11/2021 21:04  No results found for this or any previous visit (from the past 24 hour(s)). a

## 2021-09-15 ENCOUNTER — Other Ambulatory Visit: Payer: Self-pay

## 2021-09-15 DIAGNOSIS — N201 Calculus of ureter: Secondary | ICD-10-CM

## 2021-09-26 ENCOUNTER — Encounter (HOSPITAL_COMMUNITY)
Admission: RE | Admit: 2021-09-26 | Discharge: 2021-09-26 | Disposition: A | Discharge status: Home / Self Care | Payer: Medicaid Other | Source: Ambulatory Visit | Attending: Urology | Admitting: Urology

## 2021-09-27 ENCOUNTER — Emergency Department (HOSPITAL_COMMUNITY): Payer: Medicaid Other

## 2021-09-27 ENCOUNTER — Emergency Department (HOSPITAL_COMMUNITY)
Admission: EM | Admit: 2021-09-27 | Discharge: 2021-09-27 | Disposition: A | Discharge status: Home / Self Care | Payer: Medicaid Other | Attending: Emergency Medicine | Admitting: Emergency Medicine

## 2021-09-27 ENCOUNTER — Encounter (HOSPITAL_COMMUNITY): Payer: Self-pay | Admitting: *Deleted

## 2021-09-27 ENCOUNTER — Other Ambulatory Visit: Payer: Self-pay

## 2021-09-27 DIAGNOSIS — R109 Unspecified abdominal pain: Secondary | ICD-10-CM

## 2021-09-27 DIAGNOSIS — N201 Calculus of ureter: Secondary | ICD-10-CM | POA: Insufficient documentation

## 2021-09-27 DIAGNOSIS — E876 Hypokalemia: Secondary | ICD-10-CM | POA: Insufficient documentation

## 2021-09-27 LAB — URINALYSIS, ROUTINE W REFLEX MICROSCOPIC
Bilirubin Urine: NEGATIVE
Glucose, UA: NEGATIVE mg/dL
Ketones, ur: NEGATIVE mg/dL
Leukocytes,Ua: NEGATIVE
Nitrite: NEGATIVE
Protein, ur: NEGATIVE mg/dL
Specific Gravity, Urine: 1.004 — ABNORMAL LOW (ref 1.005–1.030)
pH: 6 (ref 5.0–8.0)

## 2021-09-27 LAB — CBC WITH DIFFERENTIAL/PLATELET
Abs Immature Granulocytes: 0.04 10*3/uL (ref 0.00–0.07)
Basophils Absolute: 0.1 10*3/uL (ref 0.0–0.1)
Basophils Relative: 1 %
Eosinophils Absolute: 0.3 10*3/uL (ref 0.0–0.5)
Eosinophils Relative: 3 %
HCT: 47.8 % (ref 39.0–52.0)
Hemoglobin: 16.6 g/dL (ref 13.0–17.0)
Immature Granulocytes: 0 %
Lymphocytes Relative: 31 %
Lymphs Abs: 3.2 10*3/uL (ref 0.7–4.0)
MCH: 33.4 pg (ref 26.0–34.0)
MCHC: 34.7 g/dL (ref 30.0–36.0)
MCV: 96.2 fL (ref 80.0–100.0)
Monocytes Absolute: 1.1 10*3/uL — ABNORMAL HIGH (ref 0.1–1.0)
Monocytes Relative: 10 %
Neutro Abs: 5.8 10*3/uL (ref 1.7–7.7)
Neutrophils Relative %: 55 %
Platelets: 332 10*3/uL (ref 150–400)
RBC: 4.97 MIL/uL (ref 4.22–5.81)
RDW: 12.4 % (ref 11.5–15.5)
WBC: 10.4 10*3/uL (ref 4.0–10.5)
nRBC: 0 % (ref 0.0–0.2)

## 2021-09-27 LAB — BASIC METABOLIC PANEL WITH GFR
Anion gap: 12 (ref 5–15)
BUN: 6 mg/dL (ref 6–20)
CO2: 21 mmol/L — ABNORMAL LOW (ref 22–32)
Calcium: 8.7 mg/dL — ABNORMAL LOW (ref 8.9–10.3)
Chloride: 106 mmol/L (ref 98–111)
Creatinine, Ser: 0.89 mg/dL (ref 0.61–1.24)
GFR, Estimated: >60 mL/min
Glucose, Bld: 105 mg/dL — ABNORMAL HIGH (ref 70–99)
Potassium: 2.8 mmol/L — ABNORMAL LOW (ref 3.5–5.1)
Sodium: 139 mmol/L (ref 135–145)

## 2021-09-27 MED ORDER — OXYCODONE HCL 5 MG PO TABS: 10.0000 mg | ORAL_TABLET | ORAL | 0 refills | Status: DC | PRN

## 2021-09-27 MED ORDER — FENTANYL CITRATE PF 50 MCG/ML IJ SOSY
50.0000 ug | PREFILLED_SYRINGE | Freq: Once | INTRAMUSCULAR | Status: AC
  Administered 2021-09-27: 50 ug via INTRAVENOUS
  Filled 2021-09-27: qty 1

## 2021-09-27 MED ORDER — SODIUM CHLORIDE 0.9 % IV BOLUS
1000.0000 mL | Freq: Once | INTRAVENOUS | Status: AC
  Administered 2021-09-27: 1000 mL via INTRAVENOUS

## 2021-09-27 MED ORDER — KETOROLAC TROMETHAMINE 15 MG/ML IJ SOLN
15.0000 mg | Freq: Once | INTRAMUSCULAR | Status: AC
  Administered 2021-09-27: 15 mg via INTRAVENOUS
  Filled 2021-09-27: qty 1

## 2021-09-27 MED ORDER — HYDROCODONE-ACETAMINOPHEN 5-325 MG PO TABS
2.0000 | ORAL_TABLET | Freq: Once | ORAL | Status: AC
  Administered 2021-09-27: 2 via ORAL
  Filled 2021-09-27: qty 2

## 2021-09-27 MED ORDER — POTASSIUM CHLORIDE 20 MEQ PO PACK
60.0000 meq | PACK | Freq: Once | ORAL | Status: AC
  Administered 2021-09-27: 60 meq via ORAL
  Filled 2021-09-27: qty 3

## 2021-09-27 NOTE — Discharge Instructions (Addendum)
Please drink plenty of fluids.  As a reminder, your lithotripsy is scheduled for Tuesday.  Please return to the emergency department for any worsening symptoms you might have.

## 2021-09-27 NOTE — ED Provider Notes (Signed)
[name] EMERGENCY DEPARTMENT Provider Note   CSN: [id number] Arrival date & time: 09/27/21  1747     History Chief Complaint  Patient presents with   Flank Pain    Jorge Nichols is a 31 y.o. male patient with history of kidney stones who presents to the emergency department with left-sided flank pain that radiates into the left groin.  Patient states he has seen urology and has a lithotripsy scheduled in 2 weeks.  He states that this stone has been present for the last 2 months and he has been unable to follow-up secondary to insurance issues and availability at the clinic.  Patient took his hydrocodone around 1 PM today and is not having any relief.  He states that he is now having decreased urinary output.  No fever or chills.  No dysuria or hematuria.  No nausea or vomiting.   Flank Pain       Home Medications Prior to Admission medications   Medication Sig Start Date End Date Taking? Authorizing Provider  oxyCODONE (ROXICODONE) 5 MG immediate release tablet Take 2 tablets (10 mg total) by mouth every 4 (four) hours as needed for severe pain. 09/27/21  Yes Meredeth Ide, Samarra Ridgely M, PA-C  ketorolac (TORADOL) 10 MG tablet Take 1 tablet (10 mg total) by mouth every 6 (six) hours as needed. 08/11/21   Edwin Dada P, DO  ondansetron (ZOFRAN ODT) 4 MG disintegrating tablet Take 1 tablet (4 mg total) by mouth every 8 (eight) hours as needed for nausea or vomiting. 12/18/19   Farrel Gordon, PA-C  ondansetron (ZOFRAN) 4 MG tablet Take 1 tablet (4 mg total) by mouth every 6 (six) hours. 09/11/21   Summerlin, Regan Rakers, PA-C  tamsulosin (FLOMAX) 0.4 MG CAPS capsule Take 1 capsule (0.4 mg total) by mouth daily. 08/28/21   Bjorn Pippin, MD      Allergies    Patient has no known allergies.    Review of Systems   Review of Systems  Genitourinary:  Positive for flank pain.  All other systems reviewed and are negative.   Physical Exam Updated Vital Signs BP (!) 150/70 (BP Location: Left  Arm)   Pulse 100   Temp 97.9 F (36.6 C) (Oral)   Resp 20   Ht 5\' 9"  (1.753 m)   Wt 125.8 kg   SpO2 100%   BMI 40.96 kg/m  Physical Exam Vitals and nursing note reviewed.  Constitutional:      General: He is not in acute distress.    Appearance: Normal appearance.  HENT:     Head: Normocephalic and atraumatic.  Eyes:     General:        Right eye: No discharge.        Left eye: No discharge.  Cardiovascular:     Comments: Regular rate and rhythm.  S1/S2 are distinct without any evidence of murmur, rubs, or gallops.  Radial pulses are 2+ bilaterally.  Dorsalis pedis pulses are 2+ bilaterally.  No evidence of pedal edema. Pulmonary:     Comments: Clear to auscultation bilaterally.  Normal effort.  No respiratory distress.  No evidence of wheezes, rales, or rhonchi heard throughout. Abdominal:     General: Abdomen is flat. Bowel sounds are normal. There is no distension.     Tenderness: There is abdominal tenderness in the left lower quadrant. There is left CVA tenderness. There is no guarding or rebound.  Musculoskeletal:        General: Normal range of motion.  Cervical back: Neck supple.  Skin:    General: Skin is warm and dry.     Findings: No rash.  Neurological:     General: No focal deficit present.     Mental Status: He is alert.  Psychiatric:        Mood and Affect: Mood normal.        Behavior: Behavior normal.     ED Results / Procedures / Treatments   Labs (all labs ordered are listed, but only abnormal results are displayed) Labs Reviewed  CBC WITH DIFFERENTIAL/PLATELET - Abnormal; Notable for the following components:      Result Value   Monocytes Absolute 1.1 (*)    All other components within normal limits  BASIC METABOLIC PANEL - Abnormal; Notable for the following components:   Potassium 2.8 (*)    CO2 21 (*)    Glucose, Bld 105 (*)    Calcium 8.7 (*)    All other components within normal limits  URINALYSIS, ROUTINE W REFLEX MICROSCOPIC -  Abnormal; Notable for the following components:   Color, Urine STRAW (*)    Specific Gravity, Urine 1.004 (*)    Hgb urine dipstick LARGE (*)    Bacteria, UA RARE (*)    All other components within normal limits    EKG None  Radiology CT Renal Stone Study  Result Date: 09/27/2021 CLINICAL DATA:  Left flank pain EXAM: CT ABDOMEN AND PELVIS WITHOUT CONTRAST TECHNIQUE: Multidetector CT imaging of the abdomen and pelvis was performed following the standard protocol without IV contrast. RADIATION DOSE REDUCTION: This exam was performed according to the departmental dose-optimization program which includes automated exposure control, adjustment of the mA and/or kV according to patient size and/or use of iterative reconstruction technique. COMPARISON:  08/11/2021 FINDINGS: Lower chest: No acute abnormality no acute abnormality. Hepatobiliary: No focal hepatic abnormality. Gallbladder unremarkable. Pancreas: No focal abnormality or ductal dilatation. Spleen: No focal abnormality.  Normal size. Adrenals/Urinary Tract: Adrenal glands normal. Moderate left hydronephrosis due to several distal left ureteral stones measuring up to 4 mm. Punctate bilateral nonobstructing renal stones. No hydronephrosis on the right. Adrenal glands and urinary bladder unremarkable. Stomach/Bowel: Stomach, large and small bowel grossly unremarkable. Normal appendix. Vascular/Lymphatic: No evidence of aneurysm or adenopathy. Reproductive: Obscured by beam hardening artifact from bilateral hip replacements. Other: No free fluid or free air. Musculoskeletal: No acute bony abnormality. Bilateral hip replacements. IMPRESSION: Several small distal left ureteral stones measuring up to 4 mm. Moderate left hydronephrosis. Bilateral nephrolithiasis. Electronically Signed   By: Charlett Nose M.D.   On: 09/27/2021 20:46    Procedures Procedures    Medications Ordered in ED Medications  sodium chloride 0.9 % bolus 1,000 mL (0 mLs Intravenous  Stopped 09/27/21 2140)  fentaNYL (SUBLIMAZE) injection 50 mcg (50 mcg Intravenous Given 09/27/21 2048)  ketorolac (TORADOL) 15 MG/ML injection 15 mg (15 mg Intravenous Given 09/27/21 2211)  potassium chloride (KLOR-CON) packet 60 mEq (60 mEq Oral Given 09/27/21 2209)  HYDROcodone-acetaminophen (NORCO/VICODIN) 5-325 MG per tablet 2 tablet (2 tablets Oral Given 09/27/21 2233)    ED Course/ Medical Decision Making/ A&P Clinical Course as of 09/27/21 2308  Sat Sep 27, 2021  2134 On reevaluation, patient states he still in pain although it is better.  Liter of fluid has been completed.  I notified patient of all labs and imaging.  I will give him a round of Toradol. [CF]  2245 On repeat evaluation, patient states his pain is returning.  I suspect that  this is likely secondary to the fentanyl wearing off.  Patient just received Norco right before I walked in the room.  Will reassess. [CF]  2246 CBC with Differential(!) Normal. [CF]  2246 Basic metabolic panel(!) Hypokalemia which was repleted here in the department today. [CF]  2246 Urinalysis, Routine w reflex microscopic Urine, Clean Catch(!) Large amounts of hematuria without evidence of UTI. [CF]  2246 CT Renal Stone Study I personally ordered and interpreted a CT renal stone study which shows evidence of multiple kidney stones in the left distal ureter.  I do agree with radiology interpretation. [CF]    Clinical Course User Index [CF] Teressa Lower, PA-C                           Medical Decision Making CAMAR GUYTON is a 31 y.o. male patient who presents to the emergency department with kidney stone pain.  Patient has known kidney stones in the left ureter.  He has lithotripsy scheduled with urology on 7/11. Patient having trouble controlling pain at home.  Patient states he has been having to deal with this for 2 months.  We will going to repeat imaging today to see where the kidney stones are in addition to some blood work.  We will add labs  as well.   Amount and/or Complexity of Data Reviewed Labs: ordered. Decision-making details documented in ED Course. Radiology: ordered. Decision-making details documented in ED Course.  Risk Prescription drug management. Parenteral controlled substances. Risk Details: Patient has lithotripsy scheduled for Tuesday.  Despite multiple rounds of pain medication his pain was somewhat poorly controlled.  Patient's been able to tolerate p.o. since being here.  Hypokalemia was replenished here in the emergency department.  Shared decision-making was done with the patient on whether the patient would like to stay for further pain medication after the current round of pain medication metabolize versus going home.  Patient opted to going home.  I encouraged him to return to the emergency department if pain becomes unmanageable at home.  I have also refilled his oxycodone prescription to get him through Tuesday.  PDMP showed the prescriptions that he was most recently filled with should have and on 09/17/2021. He is safe for discharge.     Final Clinical Impression(s) / ED Diagnoses Final diagnoses:  Ureterolithiasis  Flank pain    Rx / DC Orders ED Discharge Orders          Ordered    oxyCODONE (ROXICODONE) 5 MG immediate release tablet  Every 4 hours PRN        09/27/21 2302              Teressa Lower, PA-C 09/27/21 2308    Gerhard Munch, MD 09/28/21 [phone]

## 2021-09-27 NOTE — ED Triage Notes (Signed)
Pt with left flank pain that radiates around to left groin.  Hx of kidney stones. Pt states he has seen the urologist.  Scheduled to have surgery in two weeks.

## 2021-09-29 ENCOUNTER — Telehealth: Payer: Self-pay

## 2021-09-29 NOTE — Telephone Encounter (Signed)
Returned call to [employer], patient wife, reports patient is close to passing stone and wishes to cancel litho for tomorrow.

## 2021-09-29 NOTE — Telephone Encounter (Signed)
Patient's wife called to cancel tomorrow's surgery for pt.  Call back [place] wife at [phone].  Thanks, Rosey Bath

## 2021-09-29 NOTE — Telephone Encounter (Signed)
Left message for patient to return call to office- patient has not picked up ESWL folder packet or provided new insurance- left message for patient that insurance may require precert for scheduled procedure-

## 2021-09-30 ENCOUNTER — Encounter (HOSPITAL_COMMUNITY): Admission: RE | Payer: Self-pay | Source: Ambulatory Visit

## 2021-09-30 ENCOUNTER — Ambulatory Visit (HOSPITAL_COMMUNITY): Admission: RE | Admit: 2021-09-30 | Payer: Medicaid Other | Source: Ambulatory Visit | Admitting: Urology

## 2021-09-30 DIAGNOSIS — N200 Calculus of kidney: Secondary | ICD-10-CM

## 2021-09-30 SURGERY — LITHOTRIPSY, ESWL
Anesthesia: LOCAL | Laterality: Left

## 2021-10-15 ENCOUNTER — Ambulatory Visit: Payer: Medicaid Other | Admitting: Physician Assistant

## 2021-12-07 ENCOUNTER — Emergency Department (HOSPITAL_COMMUNITY): Payer: Managed Care, Other (non HMO)

## 2021-12-07 ENCOUNTER — Encounter (HOSPITAL_COMMUNITY): Payer: Self-pay

## 2021-12-07 ENCOUNTER — Emergency Department (HOSPITAL_COMMUNITY)
Admission: EM | Admit: 2021-12-07 | Discharge: 2021-12-07 | Disposition: A | Discharge status: Home / Self Care | Payer: Managed Care, Other (non HMO) | Attending: Emergency Medicine | Admitting: Emergency Medicine

## 2021-12-07 ENCOUNTER — Other Ambulatory Visit: Payer: Self-pay

## 2021-12-07 DIAGNOSIS — N200 Calculus of kidney: Secondary | ICD-10-CM | POA: Insufficient documentation

## 2021-12-07 DIAGNOSIS — R319 Hematuria, unspecified: Secondary | ICD-10-CM | POA: Diagnosis present

## 2021-12-07 LAB — COMPREHENSIVE METABOLIC PANEL
ALT: 28 U/L (ref 0–44)
AST: 23 U/L (ref 15–41)
Albumin: 3.9 g/dL (ref 3.5–5.0)
Alkaline Phosphatase: 58 U/L (ref 38–126)
Anion gap: 9 (ref 5–15)
BUN: 10 mg/dL (ref 6–20)
CO2: 23 mmol/L (ref 22–32)
Calcium: 8.9 mg/dL (ref 8.9–10.3)
Chloride: 108 mmol/L (ref 98–111)
Creatinine, Ser: 0.89 mg/dL (ref 0.61–1.24)
GFR, Estimated: >60 mL/min (ref >60)
Glucose, Bld: 100 mg/dL — ABNORMAL HIGH (ref 70–99)
Potassium: 3.7 mmol/L (ref 3.5–5.1)
Sodium: 140 mmol/L (ref 135–145)
Total Bilirubin: 0.6 mg/dL (ref 0.3–1.2)
Total Protein: 7.1 g/dL (ref 6.5–8.1)

## 2021-12-07 LAB — URINALYSIS, ROUTINE W REFLEX MICROSCOPIC
Bilirubin Urine: NEGATIVE
Glucose, UA: NEGATIVE mg/dL
Hgb urine dipstick: NEGATIVE
Ketones, ur: NEGATIVE mg/dL
Leukocytes,Ua: NEGATIVE
Nitrite: NEGATIVE
Protein, ur: NEGATIVE mg/dL
Specific Gravity, Urine: 1.018 (ref 1.005–1.030)
pH: 6 (ref 5.0–8.0)

## 2021-12-07 LAB — CBC
HCT: 47.8 % (ref 39.0–52.0)
Hemoglobin: 17.2 g/dL — ABNORMAL HIGH (ref 13.0–17.0)
MCH: 34.3 pg — ABNORMAL HIGH (ref 26.0–34.0)
MCHC: 36 g/dL (ref 30.0–36.0)
MCV: 95.2 fL (ref 80.0–100.0)
Platelets: 322 10*3/uL (ref 150–400)
RBC: 5.02 MIL/uL (ref 4.22–5.81)
RDW: 11.9 % (ref 11.5–15.5)
WBC: 9.6 10*3/uL (ref 4.0–10.5)
nRBC: 0 % (ref 0.0–0.2)

## 2021-12-07 LAB — LIPASE, BLOOD: Lipase: 39 U/L (ref 11–51)

## 2021-12-07 MED ORDER — SODIUM CHLORIDE 0.9 % IV BOLUS
1000.0000 mL | Freq: Once | INTRAVENOUS | Status: AC
  Administered 2021-12-07: 1000 mL via INTRAVENOUS

## 2021-12-07 MED ORDER — MORPHINE SULFATE (PF) 4 MG/ML IV SOLN
4.0000 mg | Freq: Once | INTRAVENOUS | Status: AC
  Administered 2021-12-07: 4 mg via INTRAVENOUS
  Filled 2021-12-07: qty 1

## 2021-12-07 MED ORDER — HYDROMORPHONE HCL 1 MG/ML IJ SOLN
0.5000 mg | Freq: Once | INTRAMUSCULAR | Status: AC
  Administered 2021-12-07: 0.5 mg via INTRAVENOUS
  Filled 2021-12-07: qty 0.5

## 2021-12-07 MED ORDER — KETOROLAC TROMETHAMINE 30 MG/ML IJ SOLN
30.0000 mg | Freq: Once | INTRAMUSCULAR | Status: AC
  Administered 2021-12-07: 30 mg via INTRAVENOUS
  Filled 2021-12-07: qty 1

## 2021-12-07 MED ORDER — ONDANSETRON HCL 4 MG/2ML IJ SOLN
4.0000 mg | Freq: Once | INTRAMUSCULAR | Status: AC
  Administered 2021-12-07: 4 mg via INTRAVENOUS
  Filled 2021-12-07: qty 2

## 2021-12-07 NOTE — ED Notes (Signed)
Pt given water and crackers  

## 2021-12-07 NOTE — ED Provider Notes (Signed)
[facility] EMERGENCY DEPARTMENT Provider Note   CSN: [id number] Arrival date & time: 12/07/21  1107     History  Kidney stone   Jorge Nichols is a 31 y.o. male history of kidney stones here for evaluation of left flank and groin pain.  This has been his chronic kidney stone pain.  He is post of lithotripsy performed previously however states he "got sick" and had to cancel.  He has not followed back up.  He took an old home oxycodone without relief.  States he has some hematuria.  No fever, nausea, vomiting.  No change in bowel movements.  No melena Abrol per rectum.  No pain or swelling to scrotum.  No penile discharge or concern for STD.  HPI     Home Medications Prior to Admission medications   Medication Sig Start Date End Date Taking? Authorizing Provider  oxyCODONE (ROXICODONE) 5 MG immediate release tablet Take 2 tablets (10 mg total) by mouth every 4 (four) hours as needed for severe pain. 09/27/21  Yes Myna Bright M, PA-C      Allergies    Patient has no known allergies.    Review of Systems   Review of Systems  Constitutional: Negative.   HENT: Negative.    Respiratory: Negative.    Cardiovascular: Negative.   Gastrointestinal: Negative.   Genitourinary:  Positive for difficulty urinating and flank pain. Negative for decreased urine volume, frequency, genital sores, hematuria, penile discharge, penile pain, penile swelling, scrotal swelling, testicular pain and urgency.       Inguinal pain  Neurological: Negative.   All other systems reviewed and are negative.   Physical Exam Updated Vital Signs BP (!) 138/93 (BP Location: Left Arm)   Pulse 72   Temp 98.2 F (36.8 C) (Oral)   Resp 17   Ht 5\' 9"  (1.753 m)   Wt 122.5 kg   SpO2 98%   BMI 39.87 kg/m  Physical Exam Vitals and nursing note reviewed.  Constitutional:      General: He is not in acute distress.    Appearance: He is well-developed. He is not ill-appearing, toxic-appearing or diaphoretic.   HENT:     Head: Atraumatic.  Eyes:     Pupils: Pupils are equal, round, and reactive to light.  Cardiovascular:     Rate and Rhythm: Normal rate and regular rhythm.     Pulses: Normal pulses.     Heart sounds: Normal heart sounds.  Pulmonary:     Effort: Pulmonary effort is normal. No respiratory distress.     Breath sounds: Normal breath sounds.  Abdominal:     General: Bowel sounds are normal. There is no distension.     Palpations: Abdomen is soft.     Tenderness: There is no abdominal tenderness. There is no right CVA tenderness, left CVA tenderness, guarding or rebound.  Musculoskeletal:        General: Normal range of motion.     Cervical back: Normal range of motion and neck supple.  Skin:    General: Skin is warm and dry.     Capillary Refill: Capillary refill takes less than 2 seconds.  Neurological:     General: No focal deficit present.     Mental Status: He is alert and oriented to person, place, and time.    ED Results / Procedures / Treatments   Labs (all labs ordered are listed, but only abnormal results are displayed) Labs Reviewed  COMPREHENSIVE METABOLIC PANEL - Abnormal; Notable  for the following components:      Result Value   Glucose, Bld 100 (*)    All other components within normal limits  CBC - Abnormal; Notable for the following components:   Hemoglobin 17.2 (*)    MCH 34.3 (*)    All other components within normal limits  LIPASE, BLOOD  URINALYSIS, ROUTINE W REFLEX MICROSCOPIC    EKG None  Radiology CT Renal Stone Study  Result Date: 12/07/2021 CLINICAL DATA:  Flank pain. EXAM: CT ABDOMEN AND PELVIS WITHOUT CONTRAST TECHNIQUE: Multidetector CT imaging of the abdomen and pelvis was performed following the standard protocol without IV contrast. RADIATION DOSE REDUCTION: This exam was performed according to the departmental dose-optimization program which includes automated exposure control, adjustment of the mA and/or kV according to patient  size and/or use of iterative reconstruction technique. COMPARISON:  09/27/2021 FINDINGS: Lower chest: No acute abnormality. Hepatobiliary: No focal liver abnormality identified. The gallbladder appears normal. No bile duct dilatation. Pancreas: Unremarkable. No pancreatic ductal dilatation or surrounding inflammatory changes. Spleen: Normal in size without focal abnormality. Adrenals/Urinary Tract: Normal adrenal glands. Bilateral nephrolithiasis. Right inferior pole renal calculus measures 3 mm. Two stones within the inferior pole of the left kidney measure up to 3 mm. No hydronephrosis or mass identified bilaterally. No signs of hydroureter or ureteral calculi. Decompressed urinary bladder is grossly unremarkable. Stomach/Bowel: Stomach appears normal. The appendix is visualized and appears normal. No bowel wall thickening, inflammation, or distension. Vascular/Lymphatic: No significant vascular findings are present. No enlarged abdominal or pelvic lymph nodes. Reproductive: Prostate is unremarkable. Other: No free fluid or fluid collections. Small fat containing umbilical hernia noted. No signs of pneumoperitoneum. Musculoskeletal: Status post bilateral hip arthroplasty. No acute or suspicious osseous findings. IMPRESSION: 1. No acute findings within the abdomen or pelvis. 2. Bilateral nonobstructing renal calculi. Electronically Signed   By: Signa Kell M.D.   On: 12/07/2021 13:14   DG Abdomen 1 View  Result Date: 12/07/2021 CLINICAL DATA:  Pain, history of kidney stones. EXAM: ABDOMEN - 1 VIEW COMPARISON:  CT abdomen pelvis dated 09/27/2021. FINDINGS: The bowel gas pattern is normal. A 2 mm calculus overlies the right kidney and a 2 mm calculus overlies the left kidney, likely representing renal calculi. No definite calculus is seen overlying the expected tracts of the ureters. IMPRESSION: Calculi overlying both kidneys measuring 2 mm. No definite calculus overlying the expected tracts of the ureters.  Electronically Signed   By: Romona Curls M.D.   On: 12/07/2021 12:28    Procedures Procedures    Medications Ordered in ED Medications  ondansetron (ZOFRAN) injection 4 mg (4 mg Intravenous Given 12/07/21 1218)  sodium chloride 0.9 % bolus 1,000 mL (1,000 mLs Intravenous New Bag/Given 12/07/21 1219)  morphine (PF) 4 MG/ML injection 4 mg (4 mg Intravenous Given 12/07/21 1218)  ketorolac (TORADOL) 30 MG/ML injection 30 mg (30 mg Intravenous Given 12/07/21 1218)  HYDROmorphone (DILAUDID) injection 0.5 mg (0.5 mg Intravenous Given 12/07/21 1410)   ED Course/ Medical Decision Making/ A&P    31 year old here for evaluation of left flank pain radiating to his groin.  States he has been dealing with chronic kidney stones over the last 6 months or so.  He was post have lithotripsy performed however he got "sick" and had to cancel.  He did not follow back up with urology.  His pain had improved however today returned.  He has pain to his left inguinal area.  No swelling, pain to scrotum.  No concern  for STD.  No change in bowel movements, melena around her rectum.  Negative CVA tap bilaterally.  Patient has had 3 CT scans over the last 5 months as well as multiple abdominal x-rays.  We will plan on labs.  I discussed possible imaging.  He is just feels like the pain that he has been dealing with previously with his kidney stones.  Unfortunately ultrasound not available at this time.  Prefers to hold off on additional CT imaging if possible which I am agreeable with given he is young and has had multiple scans.  Treat symptomatically in the meantime.  Labs and imaging personally viewed and interpreted:  CBC without leukocytosis Lipase 39 UA negative for infection Metabolic panel without significant abnormality KUB without significant abnormality  Discussed results with patient.  Given KUB without large stone recommended CT scan to ensure not missing other etiology of his pain.  He is agreeable.  CT  scan without obstructing stone.  I went to discuss with the patient however he states just before CT scan he had hematuria and passed a large stone.  I did visualize this in his urinalysis at bedside.  Large stone present in urine.  This is likely the cause of his pain.  Encouraged him to follow-up outpatient given history of recurrent stones.  Low suspicion for testicular torsion, bacterial infectious process, perforation, obstruction.   On repeat exam patient does not have a surgical abdomin and there are no peritoneal signs.  No indication of appendicitis, bowel obstruction, bowel perforation, cholecystitis, diverticulitis, infected stone.   Tolerating p.o. intake  The patient has been appropriately medically screened and/or stabilized in the ED. I have low suspicion for any other emergent medical condition which would require further screening, evaluation or treatment in the ED or require inpatient management.  Patient is hemodynamically stable and in no acute distress.  Patient able to ambulate in department prior to ED.  Evaluation does not show acute pathology that would require ongoing or additional emergent interventions while in the emergency department or further inpatient treatment.  I have discussed the diagnosis with the patient and answered all questions.  Pain is been managed while in the emergency department and patient has no further complaints prior to discharge.  Patient is comfortable with plan discussed in room and is stable for discharge at this time.  I have discussed strict return precautions for returning to the emergency department.  Patient was encouraged to follow-up with PCP/specialist refer to at discharge.                             Medical Decision Making Amount and/or Complexity of Data Reviewed External Data Reviewed: labs, radiology and notes. Labs: ordered. Decision-making details documented in ED Course. Radiology: ordered and independent interpretation  performed. Decision-making details documented in ED Course.  Risk OTC drugs. Prescription drug management. Parenteral controlled substances. Decision regarding hospitalization. Diagnosis or treatment significantly limited by social determinants of health.           Final Clinical Impression(s) / ED Diagnoses Final diagnoses:  Kidney stone    Rx / DC Orders ED Discharge Orders     None         Augie Vane A, PA-C 12/07/21 1519    Sloan Leiter, DO 12/08/21 [phone]

## 2021-12-07 NOTE — ED Triage Notes (Signed)
Kidney stones, going on for a few months.  Dx this year and pain went away for a little while with medications but now the pain is back.   Painful groin

## 2021-12-07 NOTE — Discharge Instructions (Addendum)
You passed her kidney stone.  I recommend following up with urology.  Return for new or worsening symptoms

## 2022-01-29 ENCOUNTER — Emergency Department (HOSPITAL_COMMUNITY): Payer: Managed Care, Other (non HMO)

## 2022-01-29 ENCOUNTER — Emergency Department (HOSPITAL_COMMUNITY)
Admission: EM | Admit: 2022-01-29 | Discharge: 2022-01-29 | Disposition: A | Discharge status: Home / Self Care | Payer: Managed Care, Other (non HMO) | Attending: Emergency Medicine | Admitting: Emergency Medicine

## 2022-01-29 ENCOUNTER — Other Ambulatory Visit: Payer: Self-pay

## 2022-01-29 ENCOUNTER — Encounter (HOSPITAL_COMMUNITY): Payer: Self-pay | Admitting: Emergency Medicine

## 2022-01-29 DIAGNOSIS — M79671 Pain in right foot: Secondary | ICD-10-CM | POA: Diagnosis present

## 2022-01-29 DIAGNOSIS — R2241 Localized swelling, mass and lump, right lower limb: Secondary | ICD-10-CM | POA: Insufficient documentation

## 2022-01-29 MED ORDER — OXYCODONE-ACETAMINOPHEN 5-325 MG PO TABS
2.0000 | ORAL_TABLET | Freq: Once | ORAL | Status: AC
  Administered 2022-01-29: 2 via ORAL
  Filled 2022-01-29: qty 2

## 2022-01-29 MED ORDER — KETOROLAC TROMETHAMINE 15 MG/ML IJ SOLN
15.0000 mg | Freq: Once | INTRAMUSCULAR | Status: AC
  Administered 2022-01-29: 15 mg via INTRAMUSCULAR
  Filled 2022-01-29: qty 1

## 2022-01-29 NOTE — ED Provider Notes (Signed)
[facility] EMERGENCY DEPARTMENT Provider Note   CSN: [id number] Arrival date & time: 01/29/22  1130     History Chief Complaint  Patient presents with   Foot Pain    Jorge Nichols is a 31 y.o. male patient who presents to the emergency department today for further evaluation of right foot pain is been ongoing for couple of days.  He denies any trauma or injury to the foot although he does state that he was jumping on and off the work truck prior to this starting to hurt.  Denies any fever or chills.  Foot Pain       Home Medications Prior to Admission medications   Medication Sig Start Date End Date Taking? Authorizing Provider  oxyCODONE (ROXICODONE) 5 MG immediate release tablet Take 2 tablets (10 mg total) by mouth every 4 (four) hours as needed for severe pain. 09/27/21   Teressa Lower, PA-C      Allergies    Patient has no known allergies.    Review of Systems   Review of Systems  All other systems reviewed and are negative.   Physical Exam Updated Vital Signs BP (!) 140/96   Pulse 83   Temp 99.7 F (37.6 C) (Oral)   Resp 20   Ht 5\' 9"  (1.753 m)   Wt 122.5 kg   SpO2 96%   BMI 39.87 kg/m  Physical Exam Vitals and nursing note reviewed.  Constitutional:      Appearance: Normal appearance.  HENT:     Head: Normocephalic and atraumatic.  Eyes:     General:        Right eye: No discharge.        Left eye: No discharge.     Conjunctiva/sclera: Conjunctivae normal.  Pulmonary:     Effort: Pulmonary effort is normal.  Feet:     Comments: Right foot is swollen and diffusely tender to palpation.  No significant erythema.  Foot is slightly warm to palpation on the right.  She also tenderness over the right lower extremity and mild swelling. Skin:    General: Skin is warm and dry.     Findings: No rash.  Neurological:     General: No focal deficit present.     Mental Status: He is alert.  Psychiatric:        Mood and Affect: Mood normal.         Behavior: Behavior normal.     ED Results / Procedures / Treatments   Labs (all labs ordered are listed, but only abnormal results are displayed) Labs Reviewed - No data to display  EKG None  Radiology Venous Img Lower Unilateral Right  Result Date: 01/29/2022 CLINICAL DATA:  Right lower extremity pain and edema. History of smoking. Evaluate for DVT. EXAM: RIGHT LOWER EXTREMITY VENOUS DOPPLER ULTRASOUND TECHNIQUE: Gray-scale sonography with graded compression, as well as color Doppler and duplex ultrasound were performed to evaluate the lower extremity deep venous systems from the level of the common femoral vein and including the common femoral, femoral, profunda femoral, popliteal and calf veins including the posterior tibial, peroneal and gastrocnemius veins when visible. The superficial great saphenous vein was also interrogated. Spectral Doppler was utilized to evaluate flow at rest and with distal augmentation maneuvers in the common femoral, femoral and popliteal veins. COMPARISON:  None Available. FINDINGS: Contralateral Common Femoral Vein: Respiratory phasicity is normal and symmetric with the symptomatic side. No evidence of thrombus. Normal compressibility. Common Femoral Vein: No evidence  of thrombus. Normal compressibility, respiratory phasicity and response to augmentation. Saphenofemoral Junction: No evidence of thrombus. Normal compressibility and flow on color Doppler imaging. Profunda Femoral Vein: No evidence of thrombus. Normal compressibility and flow on color Doppler imaging. Femoral Vein: No evidence of thrombus. Normal compressibility, respiratory phasicity and response to augmentation. Popliteal Vein: No evidence of thrombus. Normal compressibility, respiratory phasicity and response to augmentation. Calf Veins: No evidence of thrombus. Normal compressibility and flow on color Doppler imaging. Superficial Great Saphenous Vein: No evidence of thrombus. Normal  compressibility. Other Findings:  None. IMPRESSION: No evidence of DVT within the right lower extremity Electronically Signed   By: Simonne Come M.D.   On: 01/29/2022 13:58   DG Foot Complete Right  Result Date: 01/29/2022 CLINICAL DATA:  Foot pain EXAM: RIGHT FOOT COMPLETE - 3+ VIEW COMPARISON:  09/07/2020 FINDINGS: Negative for fracture.  Negative for arthropathy Sewing needle is present in the soft tissues of the foot below the third and fourth metatarsals. The needle is fractured. No change from 2022. IMPRESSION: Sewing needle in the soft tissues below the third and fourth metatarsals, unchanged since 2022 Negative for fracture. Electronically Signed   By: Marlan Palau M.D.   On: 01/29/2022 12:52    Procedures Procedures    Medications Ordered in ED Medications  ketorolac (TORADOL) 15 MG/ML injection 15 mg (15 mg Intramuscular Given 01/29/22 1332)  oxyCODONE-acetaminophen (PERCOCET/ROXICET) 5-325 MG per tablet 2 tablet (2 tablets Oral Given 01/29/22 1332)    ED Course/ Medical Decision Making/ A&P Clinical Course as of 01/29/22 1530  Thu Jan 29, 2022  1456 DG Foot Complete Right I personally ordered and interpreted a foot x-ray denies any events fractured foot.  There is evidence of piece of metal in the foot which is present on previous imaging back in 2022.  This is not on the same location of where the patient is complaining of pain.  He is complaining of pain primarily on the plantar aspect of the second and third toes. [CF]  1457 US Venous Img Lower Unilateral Right I personally ordered and interpreted this study denies any evidence of DVT.  I do agree with radiologist interpretation. [CF]  1500 On reevaluation, patient is feeling somewhat better after Percocet and Toradol.  I went over imaging with him at the bedside.  I will write him out for work.  Gave him conservative measures for treatment. [CF]    Clinical Course User Index [CF] Teressa Lower, PA-C                            Medical Decision Making Jorge Nichols is a 31 y.o. male patient who presents to the emergency department today for further evaluation of right foot pain.  Suspect this is likely a sprain ankle concern that the patient was jumping on and off the work truck prior to this starting.  Patient does have a history of gout but his not had any increased red meat, alcohol, or seafood intake.  I will get an x-ray of the foot to start give him pain medication.  Imaging of the foot was negative.  Patient also having right leg pain.  I will get ultrasound to assess.  Imaging is normal.  He is feeling slightly better.  This is likely an ankle sprain or foot sprain.  I will also give him follow-up with podiatry to evaluate for this possible wire or sewing needle in his foot.  He is safe for discharge at this time.  I discussed conservative therapy with him which is highlighted in the ED course. Strict return precautions were discussed.    Amount and/or Complexity of Data Reviewed Radiology: ordered. Decision-making details documented in ED Course.  Risk Prescription drug management.  Heart failure exacerbation Final Clinical Impression(s) / ED Diagnoses Final diagnoses:  Right foot pain    Rx / DC Orders ED Discharge Orders     None         Teressa Lower, [place] 01/29/22 1530    Terald Sleeper, MD 01/29/22 1655

## 2022-01-29 NOTE — ED Triage Notes (Signed)
Pt via POV c/o right foot swelling and pain since Tuesday, worse this morning with inability to bear weight. Hx gout. Right foot is visibly swollen and exquisitely tender to palpation. Redness noted across dorsal aspect of foot. Pain currently 7/10, no meds PTA.

## 2022-01-29 NOTE — ED Notes (Signed)
See triage notes. Nad. Pedal pulses present

## 2022-01-29 NOTE — Discharge Instructions (Addendum)
I have given you follow-up for podiatry to have your foot inspected if this does not get better.  I would also give him a call regarding the needle that is stuck in your foot.  Please take 600 mg ibuprofen every 6 hours as needed for pain control.  You can also use an ice bucket 10 minutes on 10 minutes off like we discussed.  Please return to the emergency department for any worsening symptoms that you might have.

## 2022-02-22 ENCOUNTER — Ambulatory Visit
Admission: EM | Admit: 2022-02-22 | Discharge: 2022-02-22 | Disposition: A | Discharge status: Home / Self Care | Payer: Managed Care, Other (non HMO) | Attending: Emergency Medicine | Admitting: Emergency Medicine

## 2022-02-22 DIAGNOSIS — J208 Acute bronchitis due to other specified organisms: Secondary | ICD-10-CM | POA: Diagnosis not present

## 2022-02-22 DIAGNOSIS — R051 Acute cough: Secondary | ICD-10-CM

## 2022-02-22 MED ORDER — ALBUTEROL SULFATE HFA 108 (90 BASE) MCG/ACT IN AERS: 1.0000 | INHALATION_SPRAY | Freq: Four times a day (QID) | RESPIRATORY_TRACT | 0 refills | Status: DC | PRN

## 2022-02-22 MED ORDER — PREDNISONE 10 MG (21) PO TBPK: ORAL_TABLET | Freq: Every day | ORAL | 0 refills | Status: DC

## 2022-02-22 NOTE — ED Triage Notes (Addendum)
Patient to Urgent Care with complaints of cough and rib pain due to coughing. Reports when he takes a deep breath he coughs. Cough is dry and unproductive. Symptoms started one week ago.  Denies any known fevers.   Has been taking robitussin/ benzonatate.

## 2022-02-22 NOTE — ED Provider Notes (Signed)
Jorge Nichols    CSN: [id number] Arrival date & time: 02/22/22  1151      History   Chief Complaint Chief Complaint  Patient presents with   Cough    HPI Jorge Nichols is a 31 y.o. male.  Patient presents with nonproductive cough x 1 week.  No fever, sore throat, shortness of breath, vomiting, diarrhea, or other symptoms.  Treatment attempted with benzonatate and robitussin.  His medical history includes metabolic syndrome, morbid obesity, chronic pain syndrome, kidney stones, dyslipidemia, chronic headache.  The history is provided by the patient and medical records.    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   Vitamin D deficiency 06/21/2015   Other hyperparathyroidism (HCC) 06/05/2015   BP (high blood pressure) 05/13/2015   Calculus of kidney 05/13/2015   Arthralgia of hip 12/04/2013   Iliopsoas bursitis 12/04/2013   History of total replacement of both hip joints 07/03/2013   Avascular necrosis of bone (HCC) 03/20/2013   Mixed dyslipidemia 09/09/2012   Metabolic syndrome 09/09/2012   Morbid obesity (HCC) 09/09/2012   Sleep disorder breathing 09/09/2012   Family history of premature CAD 09/09/2012    Past Surgical History:  Procedure Laterality Date   CYSTOSCOPY WITH URETEROSCOPY, STONE BASKETRY AND STENT PLACEMENT Right 01/02/2016   Procedure: CYSTOSCOPY WITH RIGHT URETEROSCOPY, STONE EXTRACTION  AND STENT PLACEMENT;  Surgeon: Bjorn Pippin, MD;   Location: [facility];  Service: Urology;  Laterality: Right;   EXTRACORPOREAL SHOCK WAVE LITHOTRIPSY  yrs ago   HIP SURGERY Bilateral 2015   HOLMIUM LASER APPLICATION Right 01/02/2016   Procedure: HOLMIUM LASER APPLICATION;  Surgeon: Bjorn Pippin, MD;  Location: [facility];  Service: Urology;  Laterality: Right;   TOTAL HIP ARTHROPLASTY Bilateral 2015   URETEROLITHOTOMY  x2  last one 2007       Home Medications    Prior to Admission medications   Medication Sig Start Date End Date Taking? Authorizing Provider  albuterol (VENTOLIN HFA) 108 (90 Base) MCG/ACT inhaler Inhale 1-2 puffs into the lungs every 6 (six) hours as needed. 02/22/22  Yes Mickie Bail, NP  predniSONE (STERAPRED UNI-PAK 21 TAB) 10 MG (21) TBPK tablet Take by mouth daily. As directed 02/22/22  Yes Mickie Bail, NP  oxyCODONE (ROXICODONE) 5 MG immediate release tablet Take 2 tablets (10 mg total) by mouth every 4 (four) hours as needed for severe pain. 09/27/21   Teressa Lower, PA-C    Family History Family History  Problem Relation Age of Onset   Hypertension Father     Social History Social History   Tobacco Use   Smoking status: Every Day    Packs/day: 0.50    Years: 15.00    Total pack years: 7.50    Types: Cigarettes   Smokeless tobacco: Current    Types: Snuff  Vaping Use   Vaping  Use: Never used  Substance Use Topics   Alcohol use: Yes    Comment: occassionally   Drug use: No     Allergies   Patient has no known allergies.   Review of Systems Review of Systems  Constitutional:  Negative for chills and fever.  HENT:  Negative for ear pain and sore throat.   Respiratory:  Positive for cough. Negative for shortness of breath.   Cardiovascular:  Negative for chest pain and palpitations.  Gastrointestinal:  Negative for diarrhea and vomiting.  Skin:  Negative for color change and rash.  All other systems reviewed and are negative.    Physical Exam Triage  Vital Signs ED Triage Vitals  Enc Vitals Group     BP      Pulse      Resp      Temp      Temp src      SpO2      Weight      Height      Head Circumference      Peak Flow      Pain Score      Pain Loc      Pain Edu?      Excl. in GC?    No data found.  Updated Vital Signs BP (!) 167/93   Pulse 95   Temp 97.9 F (36.6 C)   Resp 18   Ht 5\' 9"  (1.753 m)   Wt 270 lb (122.5 kg)   SpO2 98%   BMI 39.87 kg/m   Visual Acuity Right Eye Distance:   Left Eye Distance:   Bilateral Distance:    Right Eye Near:   Left Eye Near:    Bilateral Near:     Physical Exam Vitals and nursing note reviewed.  Constitutional:      General: He is not in acute distress.    Appearance: He is well-developed. He is obese. He is not ill-appearing.  HENT:     Right Ear: Tympanic membrane normal.     Left Ear: Tympanic membrane normal.     Nose: Nose normal.     Mouth/Throat:     Mouth: Mucous membranes are moist.     Pharynx: Oropharynx is clear.  Cardiovascular:     Rate and Rhythm: Normal rate and regular rhythm.     Heart sounds: Normal heart sounds.  Pulmonary:     Effort: Pulmonary effort is normal. No respiratory distress.     Breath sounds: Normal breath sounds.  Musculoskeletal:     Cervical back: Neck supple.  Skin:    General: Skin is warm and dry.  Neurological:     Mental Status: He is alert.  Psychiatric:        Mood and Affect: Mood normal.        Behavior: Behavior normal.      UC Treatments / Results  Labs (all labs ordered are listed, but only abnormal results are displayed) Labs Reviewed - No data to display  EKG   Radiology No results found.  Procedures Procedures (including critical care time)  Medications Ordered in UC Medications - No data to display  Initial Impression / Assessment and Plan / UC Course  I have reviewed the triage vital signs and the nursing notes.  Pertinent labs & imaging results that were available during my care of  the patient were reviewed by me and considered in my medical decision making (see chart for details).    Cough, viral  bronchitis.  Patient declines COVID test.  Patient has strong odor of diesel fuel which he reports is on his close and boots because he is stating some [employer].  Discussed that this may be contributing to his cough.  No respiratory distress, O2 sat 98% on room air, lungs are clear.  Treating today with albuterol inhaler and prednisone.  Instructed him to follow-up with his PCP.  Also discussed with patient that his blood pressure is elevated today and needs to be rechecked by PCP in 2 to 4 weeks.  He agrees to plan of care.    Final Clinical Impressions(s) / UC Diagnoses   Final diagnoses:  Acute cough  Viral bronchitis     Discharge Instructions      Use the albuterol inhaler and take the prednisone as directed.  Follow up with your primary care provider if your symptoms are not improving.    Your blood pressure is elevated today at 167/93.  Please have this rechecked by your primary care provider in 2-4 weeks.          ED Prescriptions     Medication Sig Dispense Auth. Provider   albuterol (VENTOLIN HFA) 108 (90 Base) MCG/ACT inhaler Inhale 1-2 puffs into the lungs every 6 (six) hours as needed. 18 g Mickie Bail, NP   predniSONE (STERAPRED UNI-PAK 21 TAB) 10 MG (21) TBPK tablet Take by mouth daily. As directed 21 tablet Mickie Bail, NP      I have reviewed the PDMP during this encounter.   Mickie Bail, NP 02/22/22 [phone]

## 2022-02-22 NOTE — Discharge Instructions (Addendum)
Use the albuterol inhaler and take the prednisone as directed.  Follow up with your primary care provider if your symptoms are not improving.    Your blood pressure is elevated today at 167/93.  Please have this rechecked by your primary care provider in 2-4 weeks.

## 2022-04-07 ENCOUNTER — Emergency Department (HOSPITAL_COMMUNITY)
Admission: EM | Admit: 2022-04-07 | Discharge: 2022-04-07 | Disposition: A | Discharge status: Home / Self Care | Payer: Managed Care, Other (non HMO) | Attending: Emergency Medicine | Admitting: Emergency Medicine

## 2022-04-07 ENCOUNTER — Encounter (HOSPITAL_COMMUNITY): Payer: Self-pay

## 2022-04-07 ENCOUNTER — Other Ambulatory Visit: Payer: Self-pay

## 2022-04-07 ENCOUNTER — Emergency Department (HOSPITAL_COMMUNITY): Payer: Managed Care, Other (non HMO)

## 2022-04-07 DIAGNOSIS — R39198 Other difficulties with micturition: Secondary | ICD-10-CM | POA: Insufficient documentation

## 2022-04-07 DIAGNOSIS — I10 Essential (primary) hypertension: Secondary | ICD-10-CM | POA: Diagnosis not present

## 2022-04-07 DIAGNOSIS — R109 Unspecified abdominal pain: Secondary | ICD-10-CM | POA: Insufficient documentation

## 2022-04-07 DIAGNOSIS — Z79899 Other long term (current) drug therapy: Secondary | ICD-10-CM | POA: Diagnosis not present

## 2022-04-07 DIAGNOSIS — R319 Hematuria, unspecified: Secondary | ICD-10-CM | POA: Insufficient documentation

## 2022-04-07 LAB — CBC WITH DIFFERENTIAL/PLATELET
Abs Immature Granulocytes: 0.03 10*3/uL (ref 0.00–0.07)
Basophils Absolute: 0.1 10*3/uL (ref 0.0–0.1)
Basophils Relative: 1 %
Eosinophils Absolute: 0.3 10*3/uL (ref 0.0–0.5)
Eosinophils Relative: 3 %
HCT: 46.9 % (ref 39.0–52.0)
Hemoglobin: 15.9 g/dL (ref 13.0–17.0)
Immature Granulocytes: 0 %
Lymphocytes Relative: 25 %
Lymphs Abs: 2.3 10*3/uL (ref 0.7–4.0)
MCH: 33.1 pg (ref 26.0–34.0)
MCHC: 33.9 g/dL (ref 30.0–36.0)
MCV: 97.7 fL (ref 80.0–100.0)
Monocytes Absolute: 0.8 10*3/uL (ref 0.1–1.0)
Monocytes Relative: 8 %
Neutro Abs: 5.9 10*3/uL (ref 1.7–7.7)
Neutrophils Relative %: 63 %
Platelets: 315 10*3/uL (ref 150–400)
RBC: 4.8 MIL/uL (ref 4.22–5.81)
RDW: 12.5 % (ref 11.5–15.5)
WBC: 9.4 10*3/uL (ref 4.0–10.5)
nRBC: 0 % (ref 0.0–0.2)

## 2022-04-07 LAB — URINALYSIS, ROUTINE W REFLEX MICROSCOPIC
Bacteria, UA: NONE SEEN
Bilirubin Urine: NEGATIVE
Glucose, UA: NEGATIVE mg/dL
Ketones, ur: NEGATIVE mg/dL
Leukocytes,Ua: NEGATIVE
Nitrite: NEGATIVE
Protein, ur: NEGATIVE mg/dL
RBC / HPF: >50 RBC/hpf — ABNORMAL HIGH (ref 0–5)
Specific Gravity, Urine: 1.02 (ref 1.005–1.030)
pH: 6 (ref 5.0–8.0)

## 2022-04-07 LAB — BASIC METABOLIC PANEL
Anion gap: 10 (ref 5–15)
BUN: 12 mg/dL (ref 6–20)
CO2: 24 mmol/L (ref 22–32)
Calcium: 9.1 mg/dL (ref 8.9–10.3)
Chloride: 104 mmol/L (ref 98–111)
Creatinine, Ser: 0.87 mg/dL (ref 0.61–1.24)
GFR, Estimated: >60 mL/min (ref >60)
Glucose, Bld: 92 mg/dL (ref 70–99)
Potassium: 3.7 mmol/L (ref 3.5–5.1)
Sodium: 138 mmol/L (ref 135–145)

## 2022-04-07 MED ORDER — KETOROLAC TROMETHAMINE 30 MG/ML IJ SOLN
30.0000 mg | Freq: Once | INTRAMUSCULAR | Status: AC
  Administered 2022-04-07: 30 mg via INTRAVENOUS
  Filled 2022-04-07: qty 1

## 2022-04-07 MED ORDER — HYDROMORPHONE HCL 1 MG/ML IJ SOLN
1.0000 mg | Freq: Once | INTRAMUSCULAR | Status: AC
  Administered 2022-04-07: 1 mg via INTRAVENOUS
  Filled 2022-04-07: qty 1

## 2022-04-07 MED ORDER — ONDANSETRON HCL 4 MG/2ML IJ SOLN
4.0000 mg | Freq: Once | INTRAMUSCULAR | Status: AC
  Administered 2022-04-07: 4 mg via INTRAVENOUS
  Filled 2022-04-07: qty 2

## 2022-04-07 MED ORDER — OXYCODONE-ACETAMINOPHEN 5-325 MG PO TABS: 1.0000 | ORAL_TABLET | Freq: Four times a day (QID) | ORAL | 0 refills | Status: DC | PRN

## 2022-04-07 MED ORDER — TAMSULOSIN HCL 0.4 MG PO CAPS: 0.4000 mg | ORAL_CAPSULE | Freq: Every day | ORAL | 0 refills | Status: DC

## 2022-04-07 MED ORDER — OXYCODONE-ACETAMINOPHEN 5-325 MG PO TABS: 1.0000 | ORAL_TABLET | ORAL | 0 refills | Status: DC | PRN

## 2022-04-07 MED ORDER — ONDANSETRON HCL 4 MG PO TABS: 4.0000 mg | ORAL_TABLET | Freq: Four times a day (QID) | ORAL | 0 refills | Status: DC

## 2022-04-07 NOTE — Discharge Instructions (Signed)
You may have a moving kidney stone.  We were unable to see a kidney stone in the ureter on your CT this evening.  Recommend that you take the Flomax as directed.  You have been prescribed short course of pain medication and nausea medicine to take as directed if needed.  Please follow-up with your urologist or return to the emergency department for any new or worsening symptoms.

## 2022-04-07 NOTE — ED Triage Notes (Signed)
Pt presents to ED with complaints of right sided flank pain started this am. Pt reports slow urination and bloody urine

## 2022-04-08 MED FILL — Oxycodone w/ Acetaminophen Tab 5-325 MG: ORAL | Qty: 6 | Status: AC

## 2022-04-10 NOTE — ED Provider Notes (Signed)
[name] AT [facility] Provider Note   CSN: [id number] Arrival date & time: 04/07/22  1728     History  Chief Complaint  Patient presents with   Flank Pain    Jorge Nichols is a 32 y.o. male.   Flank Pain Pertinent negatives include no chest pain, no abdominal pain, no headaches and no shortness of breath.       Jorge Nichols is a 32 y.o. male with past medical history of chronic hip pain, kidney stones, hypertension, who presents to the Emergency Department complaining of right-sided flank pain, decreased urine output and hematuria.  States symptoms began on the day of arrival.  Have been intermittent.  States he is concerned that he has another kidney stone.  Current pain feels similar to previous.  He denies any pain radiating into his abdomen or groin.  Also denies any nausea, vomiting, fever or chills.  No penile discharge swelling or tenderness of his scrotum.    Home Medications Prior to Admission medications   Medication Sig Start Date End Date Taking? Authorizing Provider  ondansetron (ZOFRAN) 4 MG tablet Take 1 tablet (4 mg total) by mouth every 6 (six) hours. As needed for nausea/vomiting 04/07/22  Yes Royale Lennartz, PA-C  oxyCODONE-acetaminophen (PERCOCET/ROXICET) 5-325 MG tablet Take 1 tablet by mouth every 4 (four) hours as needed. 04/07/22  Yes Roseland Braun, PA-C  oxyCODONE-acetaminophen (PERCOCET/ROXICET) 5-325 MG tablet Take 1 tablet by mouth every 6 (six) hours as needed for severe pain. 04/07/22  Yes Catharine Kettlewell, PA-C  tamsulosin (FLOMAX) 0.4 MG CAPS capsule Take 1 capsule (0.4 mg total) by mouth daily. 04/07/22  Yes Gresham Caetano, PA-C  albuterol (VENTOLIN HFA) 108 (90 Base) MCG/ACT inhaler Inhale 1-2 puffs into the lungs every 6 (six) hours as needed. 02/22/22   Mickie Bail, NP  predniSONE (STERAPRED UNI-PAK 21 TAB) 10 MG (21) TBPK tablet Take by mouth daily. As directed 02/22/22   Mickie Bail, NP      Allergies     Patient has no known allergies.    Review of Systems   Review of Systems  Constitutional:  Negative for chills and fever.  Respiratory:  Negative for shortness of breath.   Cardiovascular:  Negative for chest pain.  Gastrointestinal:  Negative for abdominal pain, diarrhea and vomiting.  Genitourinary:  Positive for difficulty urinating, flank pain and hematuria. Negative for dysuria, penile discharge, penile pain, penile swelling, scrotal swelling and testicular pain.  Musculoskeletal:  Negative for back pain.  Skin:  Negative for rash.  Neurological:  Negative for weakness and headaches.    Physical Exam Updated Vital Signs BP (!) 154/102 (BP Location: Right Arm)   Pulse 82   Temp 98.6 F (37 C) (Oral)   Resp 19   Ht 5\' 8"  (1.727 m)   Wt 122.2 kg   SpO2 100%   BMI 40.95 kg/m  Physical Exam Vitals and nursing note reviewed.  Constitutional:      General: He is not in acute distress.    Appearance: Normal appearance. He is not toxic-appearing.  Cardiovascular:     Rate and Rhythm: Normal rate and regular rhythm.     Pulses: Normal pulses.  Pulmonary:     Effort: Pulmonary effort is normal. No respiratory distress.  Abdominal:     Palpations: Abdomen is soft.     Tenderness: There is no abdominal tenderness. There is no right CVA tenderness or left CVA tenderness.  Musculoskeletal:  General: No tenderness or signs of injury.  Skin:    General: Skin is warm.     Capillary Refill: Capillary refill takes less than 2 seconds.  Neurological:     General: No focal deficit present.     Mental Status: He is alert.     Sensory: No sensory deficit.     Motor: No weakness.     ED Results / Procedures / Treatments   Labs (all labs ordered are listed, but only abnormal results are displayed) Labs Reviewed  URINALYSIS, ROUTINE W REFLEX MICROSCOPIC - Abnormal; Notable for the following components:      Result Value   Hgb urine dipstick LARGE (*)    RBC / HPF >50 (*)     All other components within normal limits  CBC WITH DIFFERENTIAL/PLATELET  BASIC METABOLIC PANEL    EKG None  Radiology No results found.  Procedures Procedures    Medications Ordered in ED Medications  HYDROmorphone (DILAUDID) injection 1 mg (1 mg Intravenous Given 04/07/22 2035)  ondansetron (ZOFRAN) injection 4 mg (4 mg Intravenous Given 04/07/22 2035)  ketorolac (TORADOL) 30 MG/ML injection 30 mg (30 mg Intravenous Given 04/07/22 2046)    ED Course/ Medical Decision Making/ A&P                             Medical Decision Making Patient here with right flank pain for several hours.  Notes some hematuria and decreased urination as well.  History of prior kidney stones and current pain feels similar.  Currently, pain is nonradiating.  He denies any burning with urination pain or swelling of the scrotum or penile discharge.  On exam, patient uncomfortable appearing but nontoxic.  Slightly hypertensive, afebrile.  No CVA tenderness on exam.  No midline spinal tenderness  Differential would include but not limited to recurrent kidney stone, pyelonephritis, UTI, recurrence of his chronic hip pain  Amount and/or Complexity of Data Reviewed Labs: ordered.    Details: Labs interpreted by me, no evidence of leukocytosis, chemistries unremarkable.  Urinalysis with large amount of blood but no evidence of UTI Radiology: ordered.    Details: CT renal stone study without evidence of ureterolithiasis or hydronephrosis Discussion of management or test interpretation with external provider(s): On recheck, patient resting comfortably after pain medication.  No evidence of obstructing stone on CT, patient may have recently passed kidney stone.  I feel given his history reasonable to provide short course of pain medication and Flomax.  He appears appropriate for discharge home and will follow-up closely outpatient with urology.  Return precautions discussed.  Risk Prescription drug  management.           Final Clinical Impression(s) / ED Diagnoses Final diagnoses:  Flank pain    Rx / DC Orders ED Discharge Orders          Ordered    tamsulosin (FLOMAX) 0.4 MG CAPS capsule  Daily        04/07/22 2142    oxyCODONE-acetaminophen (PERCOCET/ROXICET) 5-325 MG tablet  Every 4 hours PRN        04/07/22 2142    ondansetron (ZOFRAN) 4 MG tablet  Every 6 hours        04/07/22 2142    oxyCODONE-acetaminophen (PERCOCET/ROXICET) 5-325 MG tablet  Every 6 hours PRN        04/07/22 2143              Kem Parkinson, PA-C 04/10/22  [place], MD 04/15/22 [phone]

## 2022-08-27 ENCOUNTER — Emergency Department (HOSPITAL_COMMUNITY): Payer: Managed Care, Other (non HMO)

## 2022-08-27 ENCOUNTER — Other Ambulatory Visit: Payer: Self-pay

## 2022-08-27 ENCOUNTER — Encounter (HOSPITAL_COMMUNITY): Payer: Self-pay

## 2022-08-27 ENCOUNTER — Emergency Department (HOSPITAL_COMMUNITY)
Admission: EM | Admit: 2022-08-27 | Discharge: 2022-08-27 | Disposition: A | Discharge status: Home / Self Care | Payer: Managed Care, Other (non HMO) | Attending: Emergency Medicine | Admitting: Emergency Medicine

## 2022-08-27 DIAGNOSIS — N201 Calculus of ureter: Secondary | ICD-10-CM | POA: Insufficient documentation

## 2022-08-27 DIAGNOSIS — R109 Unspecified abdominal pain: Secondary | ICD-10-CM | POA: Diagnosis present

## 2022-08-27 LAB — URINALYSIS, ROUTINE W REFLEX MICROSCOPIC
Bilirubin Urine: NEGATIVE
Glucose, UA: NEGATIVE mg/dL
Ketones, ur: 5 mg/dL — AB
Leukocytes,Ua: NEGATIVE
Nitrite: NEGATIVE
Protein, ur: NEGATIVE mg/dL
RBC / HPF: >50 RBC/hpf (ref 0–5)
Specific Gravity, Urine: 1.024 (ref 1.005–1.030)
pH: 6 (ref 5.0–8.0)

## 2022-08-27 LAB — CBC WITH DIFFERENTIAL/PLATELET
Abs Immature Granulocytes: 0.03 10*3/uL (ref 0.00–0.07)
Basophils Absolute: 0.1 10*3/uL (ref 0.0–0.1)
Basophils Relative: 1 %
Eosinophils Absolute: 0.4 10*3/uL (ref 0.0–0.5)
Eosinophils Relative: 5 %
HCT: 47.5 % (ref 39.0–52.0)
Hemoglobin: 16.4 g/dL (ref 13.0–17.0)
Immature Granulocytes: 0 %
Lymphocytes Relative: 34 %
Lymphs Abs: 2.9 10*3/uL (ref 0.7–4.0)
MCH: 32.7 pg (ref 26.0–34.0)
MCHC: 34.5 g/dL (ref 30.0–36.0)
MCV: 94.8 fL (ref 80.0–100.0)
Monocytes Absolute: 0.7 10*3/uL (ref 0.1–1.0)
Monocytes Relative: 8 %
Neutro Abs: 4.4 10*3/uL (ref 1.7–7.7)
Neutrophils Relative %: 52 %
Platelets: 333 10*3/uL (ref 150–400)
RBC: 5.01 MIL/uL (ref 4.22–5.81)
RDW: 12.3 % (ref 11.5–15.5)
WBC: 8.4 10*3/uL (ref 4.0–10.5)
nRBC: 0 % (ref 0.0–0.2)

## 2022-08-27 LAB — BASIC METABOLIC PANEL
Anion gap: 8 (ref 5–15)
BUN: 10 mg/dL (ref 6–20)
CO2: 22 mmol/L (ref 22–32)
Calcium: 9.2 mg/dL (ref 8.9–10.3)
Chloride: 108 mmol/L (ref 98–111)
Creatinine, Ser: 0.78 mg/dL (ref 0.61–1.24)
GFR, Estimated: >60 mL/min (ref >60)
Glucose, Bld: 109 mg/dL — ABNORMAL HIGH (ref 70–99)
Potassium: 3.6 mmol/L (ref 3.5–5.1)
Sodium: 138 mmol/L (ref 135–145)

## 2022-08-27 MED ORDER — OXYCODONE-ACETAMINOPHEN 5-325 MG PO TABS: 1.0000 | ORAL_TABLET | Freq: Four times a day (QID) | ORAL | 0 refills | Status: DC | PRN

## 2022-08-27 MED ORDER — FENTANYL CITRATE PF 50 MCG/ML IJ SOSY
50.0000 ug | PREFILLED_SYRINGE | Freq: Once | INTRAMUSCULAR | Status: AC
  Administered 2022-08-27: 50 ug via INTRAVENOUS
  Filled 2022-08-27: qty 1

## 2022-08-27 MED ORDER — TAMSULOSIN HCL 0.4 MG PO CAPS: 0.4000 mg | ORAL_CAPSULE | Freq: Every day | ORAL | 0 refills | Status: DC

## 2022-08-27 MED ORDER — HYDROMORPHONE HCL 1 MG/ML IJ SOLN
0.5000 mg | Freq: Once | INTRAMUSCULAR | Status: AC
  Administered 2022-08-27: 0.5 mg via INTRAVENOUS
  Filled 2022-08-27: qty 0.5

## 2022-08-27 MED ORDER — SODIUM CHLORIDE 0.9 % IV BOLUS
1000.0000 mL | Freq: Once | INTRAVENOUS | Status: AC
  Administered 2022-08-27: 1000 mL via INTRAVENOUS

## 2022-08-27 NOTE — Discharge Instructions (Signed)
I have given you 2 prescriptions today.  The first is Flomax which will help dilate the ureter and allow the kidney stone to pass.  Please take this until you have passed the kidney stone.  The second is Percocet which is used for breakthrough pain.  You can take 600 mg of ibuprofen every 6 hours and use the Percocet for breakthrough pain.  I would like for you to follow-up with your urologist.  You may return to the emergency room anytime for any worsening symptoms.

## 2022-08-27 NOTE — ED Triage Notes (Signed)
Pt presents to ED from home with c/o right sided flank pain that started a couple of days ago. Pt has hx of frequent kidney stones.

## 2022-08-27 NOTE — ED Provider Notes (Signed)
[place] [facility] AT [facility] Provider Note   CSN: [id number] Arrival date & time: 08/27/22  1747     History Chief Complaint  Patient presents with   Flank Pain    Jorge Nichols is a 32 y.o. male patient with history of frequent kidney stones who presents to the emergency department today with right-sided flank pain that started 2 days ago.  He states this feels identical to his other kidney stones he has had in the past.  He states started off intermittently but is now more constant and radiates into the right groin.  He reports associated dysuria, hematuria, and decreased stream.  He denies any vomiting, diarrhea, fever, chills.  Does endorse associated nausea intermittently.   Flank Pain       Home Medications Prior to Admission medications   Medication Sig Start Date End Date Taking? Authorizing Provider  ibuprofen (ADVIL) 200 MG tablet Take 400 mg by mouth every 6 (six) hours as needed for moderate pain.   Yes [provider]  metoprolol succinate (TOPROL-XL) 25 MG 24 hr tablet Take 25 mg by mouth every morning. 05/15/22  Yes [provider]  oxyCODONE-acetaminophen (PERCOCET/ROXICET) 5-325 MG tablet Take 1 tablet by mouth every 6 (six) hours as needed for severe pain. 08/27/22  Yes Meredeth Ide, Keshonna Valvo M, PA-C  tamsulosin (FLOMAX) 0.4 MG CAPS capsule Take 1 capsule (0.4 mg total) by mouth daily. 08/27/22  Yes Ercilia Bettinger M, PA-C  ondansetron (ZOFRAN) 4 MG tablet Take 1 tablet (4 mg total) by mouth every 6 (six) hours. As needed for nausea/vomiting 04/07/22   Pauline Aus, PA-C      Allergies    Patient has no known allergies.    Review of Systems   Review of Systems  Genitourinary:  Positive for flank pain.  All other systems reviewed and are negative.   Physical Exam Updated Vital Signs BP (!) 161/105   Pulse 80   Temp 98.8 F (37.1 C) (Oral)   Resp 18   Ht 5\' 8"  (1.727 m)   Wt 122.2 kg   SpO2 99%   BMI 40.96 kg/m   Physical Exam Vitals and nursing note reviewed.  Constitutional:      Appearance: Normal appearance.  HENT:     Head: Normocephalic and atraumatic.  Eyes:     General:        Right eye: No discharge.        Left eye: No discharge.     Conjunctiva/sclera: Conjunctivae normal.  Pulmonary:     Effort: Pulmonary effort is normal.  Abdominal:     Tenderness: There is right CVA tenderness.  Skin:    General: Skin is warm and dry.     Findings: No rash.  Neurological:     General: No focal deficit present.     Mental Status: He is alert.  Psychiatric:        Mood and Affect: Mood normal.        Behavior: Behavior normal.     ED Results / Procedures / Treatments   Labs (all labs ordered are listed, but only abnormal results are displayed) Labs Reviewed  BASIC METABOLIC PANEL - Abnormal; Notable for the following components:      Result Value   Glucose, Bld 109 (*)    All other components within normal limits  URINALYSIS, ROUTINE W REFLEX MICROSCOPIC - Abnormal; Notable for the following components:   APPearance HAZY (*)    Hgb urine dipstick  LARGE (*)    Ketones, ur 5 (*)    Bacteria, UA RARE (*)    All other components within normal limits  CBC WITH DIFFERENTIAL/PLATELET    EKG None  Radiology CT Renal Stone Study  Result Date: 08/27/2022 CLINICAL DATA:  Abdominal and flank pain, right-sided. EXAM: CT ABDOMEN AND PELVIS WITHOUT CONTRAST TECHNIQUE: Multidetector CT imaging of the abdomen and pelvis was performed following the standard protocol without IV contrast. RADIATION DOSE REDUCTION: This exam was performed according to the departmental dose-optimization program which includes automated exposure control, adjustment of the mA and/or kV according to patient size and/or use of iterative reconstruction technique. COMPARISON:  CT renal stone 04/07/2022 FINDINGS: Lower chest: No acute abnormality. Hepatobiliary: No focal liver abnormality is seen. No gallstones, gallbladder  wall thickening, or biliary dilatation. Pancreas: Unremarkable. No pancreatic ductal dilatation or surrounding inflammatory changes. Spleen: Normal in size without focal abnormality. Adrenals/Urinary Tract: Bladder is not well visualized secondary to streak artifact in the pelvis. There is a 2 mm calculus in the proximal right ureter. There is no hydronephrosis. There are additional punctate nonobstructing bilateral renal calculi. The adrenal glands are within normal limits. Stomach/Bowel: Stomach is within normal limits. Appendix appears normal. No evidence of bowel wall thickening, distention, or inflammatory changes. Vascular/Lymphatic: No significant vascular findings are present. No enlarged abdominal or pelvic lymph nodes. Reproductive: Prostate is unremarkable. Other: No abdominal wall hernia or abnormality. No abdominopelvic ascites. Musculoskeletal: No acute osseous abnormality. Bilateral hip arthroplasties are present. IMPRESSION: 1. 2 mm calculus in the proximal right ureter. No hydronephrosis. 2. Additional punctate nonobstructing bilateral renal calculi. Electronically Signed   By: Darliss Cheney M.D.   On: 08/27/2022 19:26    Procedures Procedures    Medications Ordered in ED Medications  sodium chloride 0.9 % bolus 1,000 mL (0 mLs Intravenous Stopped 08/27/22 2001)  fentaNYL (SUBLIMAZE) injection 50 mcg (50 mcg Intravenous Given 08/27/22 1838)  HYDROmorphone (DILAUDID) injection 0.5 mg (0.5 mg Intravenous Given 08/27/22 2001)    ED Course/ Medical Decision Making/ A&P Clinical Course as of 08/27/22 2003  Thu Aug 27, 2022  1955 On reevaluation, patient states that the pain is starting to return.  I notified him of the labs and imaging results.  He does have a 2 mm right ureteral stone.  I will plan to give him some Flomax and pain medication to go home with.  Patient comfortable going home. [CF]  2002 CBC with Differential Normal. [CF]  2002 Basic metabolic panel(!) Normal. [CF]  2002  Urinalysis, Routine w reflex microscopic -Urine, Clean Catch(!) There is evidence of hematuria. [CF]  2002 CT Renal Stone Study I personally ordered and interpreted the study and there is evidence of ureteral stone in the right ureter.  I do agree with the radiologist interpretation. [CF]    Clinical Course User Index [CF] Teressa Lower, PA-C   {   Click here for ABCD2, HEART and other calculators  Medical Decision Making Jorge Nichols is a 32 y.o. male patient who presents to the emergency department today for further evaluation of right-sided flank pain in the groin.  This seems pretty consistent with his previous kidney stones.  I will plan to get basic labs, give him fluids, given pain medications, get a CT renal stone study.  Patient has had very large kidney stones in the past and this the reasoning why feel a CT renal stone study is necessary at this time despite the patient having frequent kidney stones.  There is evidence of a 2 mm stone in the right ureter.  I will plan to give the patient pain medication and Flomax.  Patient tolerating p.o. fluids without any evidence of vomiting.  He is comfortable going home.  Strict turn precautions were discussed.  He is safe for discharge at this time.   Amount and/or Complexity of Data Reviewed Labs: ordered. Radiology: ordered.  Risk Prescription drug management.   Final Clinical Impression(s) / ED Diagnoses Final diagnoses:  Ureterolithiasis    Rx / DC Orders ED Discharge Orders          Ordered    oxyCODONE-acetaminophen (PERCOCET/ROXICET) 5-325 MG tablet  Every 6 hours PRN        08/27/22 1957    tamsulosin (FLOMAX) 0.4 MG CAPS capsule  Daily        08/27/22 1957              Teressa Lower, [place] 08/27/22 2003    Loetta Rough, MD 08/27/22 [phone]

## 2022-10-07 ENCOUNTER — Other Ambulatory Visit: Payer: Self-pay

## 2022-10-07 ENCOUNTER — Emergency Department (HOSPITAL_COMMUNITY)
Admission: EM | Admit: 2022-10-07 | Discharge: 2022-10-07 | Disposition: A | Discharge status: Home / Self Care | Payer: Managed Care, Other (non HMO) | Attending: Emergency Medicine | Admitting: Emergency Medicine

## 2022-10-07 ENCOUNTER — Encounter (HOSPITAL_COMMUNITY): Payer: Self-pay | Admitting: Emergency Medicine

## 2022-10-07 ENCOUNTER — Emergency Department (HOSPITAL_COMMUNITY): Payer: Managed Care, Other (non HMO)

## 2022-10-07 DIAGNOSIS — R109 Unspecified abdominal pain: Secondary | ICD-10-CM | POA: Diagnosis present

## 2022-10-07 DIAGNOSIS — N201 Calculus of ureter: Secondary | ICD-10-CM | POA: Diagnosis not present

## 2022-10-07 LAB — BASIC METABOLIC PANEL
Anion gap: 10 (ref 5–15)
BUN: 14 mg/dL (ref 6–20)
CO2: 23 mmol/L (ref 22–32)
Calcium: 9.2 mg/dL (ref 8.9–10.3)
Chloride: 102 mmol/L (ref 98–111)
Creatinine, Ser: 1.08 mg/dL (ref 0.61–1.24)
GFR, Estimated: >60 mL/min (ref >60)
Glucose, Bld: 97 mg/dL (ref 70–99)
Potassium: 3.9 mmol/L (ref 3.5–5.1)
Sodium: 135 mmol/L (ref 135–145)

## 2022-10-07 LAB — CBC
HCT: 47.5 % (ref 39.0–52.0)
Hemoglobin: 16.4 g/dL (ref 13.0–17.0)
MCH: 33.3 pg (ref 26.0–34.0)
MCHC: 34.5 g/dL (ref 30.0–36.0)
MCV: 96.5 fL (ref 80.0–100.0)
Platelets: 321 10*3/uL (ref 150–400)
RBC: 4.92 MIL/uL (ref 4.22–5.81)
RDW: 12.3 % (ref 11.5–15.5)
WBC: 10.1 10*3/uL (ref 4.0–10.5)
nRBC: 0 % (ref 0.0–0.2)

## 2022-10-07 LAB — URINALYSIS, ROUTINE W REFLEX MICROSCOPIC
Bilirubin Urine: NEGATIVE
Glucose, UA: NEGATIVE mg/dL
Hgb urine dipstick: NEGATIVE
Ketones, ur: NEGATIVE mg/dL
Leukocytes,Ua: NEGATIVE
Nitrite: NEGATIVE
Protein, ur: 30 mg/dL — AB
Specific Gravity, Urine: 1.024 (ref 1.005–1.030)
pH: 5 (ref 5.0–8.0)

## 2022-10-07 MED ORDER — ONDANSETRON HCL 4 MG/2ML IJ SOLN
4.0000 mg | Freq: Once | INTRAMUSCULAR | Status: AC
  Administered 2022-10-07: 4 mg via INTRAVENOUS
  Filled 2022-10-07: qty 2

## 2022-10-07 MED ORDER — TAMSULOSIN HCL 0.4 MG PO CAPS: 0.4000 mg | ORAL_CAPSULE | Freq: Two times a day (BID) | ORAL | 0 refills | Status: DC

## 2022-10-07 MED ORDER — OXYCODONE-ACETAMINOPHEN 5-325 MG PO TABS
2.0000 | ORAL_TABLET | Freq: Once | ORAL | Status: AC
  Administered 2022-10-07: 2 via ORAL
  Filled 2022-10-07: qty 2

## 2022-10-07 MED ORDER — HYDROMORPHONE HCL 1 MG/ML IJ SOLN
0.5000 mg | Freq: Once | INTRAMUSCULAR | Status: AC
  Administered 2022-10-07: 0.5 mg via INTRAVENOUS
  Filled 2022-10-07: qty 0.5

## 2022-10-07 MED ORDER — PROMETHAZINE HCL 25 MG PO TABS: 25.0000 mg | ORAL_TABLET | Freq: Four times a day (QID) | ORAL | 0 refills | Status: DC | PRN

## 2022-10-07 MED ORDER — OXYCODONE HCL 5 MG PO TABS: 5.0000 mg | ORAL_TABLET | ORAL | 0 refills | Status: DC | PRN

## 2022-10-07 MED ORDER — KETOROLAC TROMETHAMINE 30 MG/ML IJ SOLN
30.0000 mg | Freq: Once | INTRAMUSCULAR | Status: AC
  Administered 2022-10-07: 30 mg via INTRAVENOUS
  Filled 2022-10-07: qty 1

## 2022-10-07 MED ORDER — KETOROLAC TROMETHAMINE 10 MG PO TABS: 10.0000 mg | ORAL_TABLET | Freq: Four times a day (QID) | ORAL | 0 refills | Status: DC | PRN

## 2022-10-07 MED ORDER — OXYCODONE-ACETAMINOPHEN 5-325 MG PO TABS: 1.0000 | ORAL_TABLET | Freq: Four times a day (QID) | ORAL | 0 refills | Status: DC | PRN

## 2022-10-07 MED ORDER — FENTANYL CITRATE PF 50 MCG/ML IJ SOSY
50.0000 ug | PREFILLED_SYRINGE | Freq: Once | INTRAMUSCULAR | Status: AC
  Administered 2022-10-07: 50 ug via INTRAVENOUS
  Filled 2022-10-07: qty 1

## 2022-10-07 NOTE — ED Provider Notes (Signed)
[place] [facility] AT [facility] Provider Note   CSN: [id number] Arrival date & time: 10/07/22  1736     History  Chief Complaint  Patient presents with   Flank Pain    Jorge Nichols is a 32 y.o. male Flank pain. Onset tonight . Pain is sharp, waxing and waning and severe. Patient also c/o hematuria. He states that it feels similar to previous kidney stones.    Flank Pain       Home Medications Prior to Admission medications   Medication Sig Start Date End Date Taking? Authorizing Provider  ibuprofen (ADVIL) 200 MG tablet Take 400 mg by mouth every 6 (six) hours as needed for moderate pain.    [provider]  metoprolol succinate (TOPROL-XL) 25 MG 24 hr tablet Take 25 mg by mouth every morning. 05/15/22   [provider]  ondansetron (ZOFRAN) 4 MG tablet Take 1 tablet (4 mg total) by mouth every 6 (six) hours. As needed for nausea/vomiting 04/07/22   Triplett, Tammy, PA-C  oxyCODONE-acetaminophen (PERCOCET/ROXICET) 5-325 MG tablet Take 1 tablet by mouth every 6 (six) hours as needed for severe pain. 08/27/22   Teressa Lower, PA-C  tamsulosin (FLOMAX) 0.4 MG CAPS capsule Take 1 capsule (0.4 mg total) by mouth daily. 08/27/22   Teressa Lower, PA-C      Allergies    Patient has no known allergies.    Review of Systems   Review of Systems  Genitourinary:  Positive for flank pain.    Physical Exam Updated Vital Signs BP (!) 137/96 (BP Location: Right Arm)   Pulse 76   Temp 99.1 F (37.3 C) (Oral)   Resp 18   Ht 5\' 8"  (1.727 m)   Wt 113.4 kg   SpO2 98%   BMI 38.01 kg/m  Physical Exam Vitals and nursing note reviewed.  Constitutional:      General: He is not in acute distress.    Appearance: He is well-developed. He is not diaphoretic.  HENT:     Head: Normocephalic and atraumatic.  Eyes:     General: No scleral icterus.    Conjunctiva/sclera: Conjunctivae normal.  Cardiovascular:     Rate and Rhythm: Normal rate  and regular rhythm.     Heart sounds: Normal heart sounds.  Pulmonary:     Effort: Pulmonary effort is normal. No respiratory distress.     Breath sounds: Normal breath sounds.  Abdominal:     Palpations: Abdomen is soft.     Tenderness: There is no abdominal tenderness. There is no right CVA tenderness or left CVA tenderness.  Musculoskeletal:     Cervical back: Normal range of motion and neck supple.  Skin:    General: Skin is warm and dry.  Neurological:     Mental Status: He is alert.  Psychiatric:        Behavior: Behavior normal.     ED Results / Procedures / Treatments   Labs (all labs ordered are listed, but only abnormal results are displayed) Labs Reviewed  URINALYSIS, ROUTINE W REFLEX MICROSCOPIC - Abnormal; Notable for the following components:      Result Value   Protein, ur 30 (*)    Bacteria, UA RARE (*)    All other components within normal limits  BASIC METABOLIC PANEL  CBC    EKG None  Radiology CT RENAL STONE STUDY  Result Date: 10/07/2022 CLINICAL DATA:  Abdominal pain, flank pain EXAM: CT ABDOMEN AND PELVIS WITHOUT  CONTRAST TECHNIQUE: Multidetector CT imaging of the abdomen and pelvis was performed following the standard protocol without IV contrast. RADIATION DOSE REDUCTION: This exam was performed according to the departmental dose-optimization program which includes automated exposure control, adjustment of the mA and/or kV according to patient size and/or use of iterative reconstruction technique. COMPARISON:  08/27/2022 FINDINGS: Lower chest: There is no focal consolidation in the lower lung fields. Hepatobiliary: No focal abnormalities are seen liver. There is no dilation of bile ducts. Gallbladder is unremarkable. Pancreas: No focal abnormalities are seen. Spleen: Unremarkable. Adrenals/Urinary Tract: Adrenals are unremarkable. There are few small bilateral renal stones largest measuring 5 mm in size in the midportion of left kidney. There is  hydronephrosis. There is 4 mm calculus in the distal course of right ureter at the level of pelvic inlet. In the previous study, the calculus was seen in the proximal right ureter close to the ureteropelvic junction. Evaluation of distal courses of both ureters adjacent to the ureterovesical junction is limited by severe beam hardening artifacts from orthopedic hardware. Urinary bladder is mostly obscured by beam hardening artifacts. Stomach/Bowel: Stomach is unremarkable. Small bowel loops are not dilated. Appendix is not dilated. There is no significant wall thickening in colon. Scattered diverticula are seen in colon without signs of focal diverticulitis. Evaluation of portion of sigmoid is limited by beam hardening artifacts. Vascular/Lymphatic: Unremarkable. Reproductive: Obscured by beam hardening artifacts. Other: There is no ascites or pneumoperitoneum. Small paraumbilical hernia containing fat is seen. Musculoskeletal: There is arthroplasty in both hips. Last lumbar vertebra is transitional. Degenerative changes are noted in lumbar spine with spinal stenosis and encroachment of neural foramina at multiple levels. IMPRESSION: There is a 4 mm right ureteral calculus at the level of pelvic inlet. There is caudal migration of right ureteral calculus without significant hydronephrosis. There are multiple small bilateral renal stones, more so in the left kidney. Area of ureterovesical junctions and urinary bladder are completely obscured by severe beam hardening artifacts. There is no evidence of intestinal obstruction or pneumoperitoneum. Appendix is not dilated. Few diverticula are seen in colon without signs of focal diverticulitis. Lumbar spondylosis. Paraumbilical hernia containing fat. There is previous arthroplasty in both hips. Electronically Signed   By: Ernie Avena M.D.   On: 10/07/2022 19:47    Procedures Procedures    Medications Ordered in ED Medications  ketorolac (TORADOL) 30 MG/ML  injection 30 mg (has no administration in time range)  ondansetron (ZOFRAN) injection 4 mg (4 mg Intravenous Given 10/07/22 1927)  fentaNYL (SUBLIMAZE) injection 50 mcg (50 mcg Intravenous Given 10/07/22 1926)    ED Course/ Medical Decision Making/ A&P Clinical Course as of 10/07/22 2232  Wed Oct 07, 2022  2000 Urinalysis, Routine w reflex microscopic -Urine, Clean Catch(!) [AH]  2000 Basic metabolic panel [AH]  2000 CBC [AH]  2000 CT RENAL STONE STUDY [AH]  2048 Patient pain is still severe  [AH]    Clinical Course User Index [AH] Arthor Captain, PA-C                             Medical Decision Making Amount and/or Complexity of Data Reviewed Labs: ordered. Decision-making details documented in ED Course. Radiology: ordered. Decision-making details documented in ED Course.  Risk Prescription drug management.   Given the large differential diagnosis for TYRIECE PATAK, the decision making in this case is of high complexity. The differential diagnosis of emergent flank pain includes, but is not  limited to :Abdominal aortic aneurysm,, Renal artery embolism,Renal vein thrombosis, Aortic dissection, Mesenteric ischemia, Pyelonephritis, Renal infarction, Renal hemorrhage, Nephrolithiasis/ Renal Colic, Bladder tumor,Cystitis, Biliary colic, Pancreatitis Perforated peptic ulcer Appendicitis ,Inguinal Hernia, Diverticulitis, Bowel obstruction Testicular torsion,Epididymitis) Shingles Lower lobe pneumonia, Retroperitoneal hematoma/abscess/tumor, Epidural abscess, Epidural hematoma  I have reviewed and interpreted the patient's labs which show . I personally reviewed and interpreted the patient's images which show no acute fingdings. After evaluating all of the data points in this case, the presentation of YUYA DELLIS is  most likely 4mm R ureteral calculus at uvj.  The presentation NOT consistent with an infected stone, nephric abscess, sepsis, or renal failure.  Similarly, this  presentation is NOT consistent with AAA; Mesenteric Ischemia; Bowel Perforation; Bowel Obstruction; Sigmoid Volvulus; Diverticulitis; Appendicitis; Peritonitis; Cholecystitis, ascending cholangitis or other gallbladder disease; perforated ulcer; significant GI bleeding, splenic rupture/infarction; Hepatic abscess; or other surgical/acute abdomen.  Similarly, this presentation is NOT consistent with ACS or Myocardial Ischemia; Pulmonary Embolism; fistula; incarcerated hernia; Pancreatitis, Aortic Dissection; Diabetic Ketoacidosis; Ischemic colitis; Psoas or other abscess; Methanol poisoning; Heavy metal toxicity; or porphyria.  Similarly, this presentation is NOT consistent with acute coronary syndrome, pulmonary embolism, dissection, borhaave's, arrythmia, pneumothorax, cardiac tamponade, or other emergent cardiopulmonary condition.  Similarly, this presentation is NOT consistent with pyelonephritis, urinary infection, pneumonia, or other focal bacterial infection.  Moreover this case is NOT consistent with testicular torsion, prostatitis, hernia, STI, or other testicular issue.  Strict return and follow-up precautions have been given by me personally or by detailed written instruction verbalized by nursing staff using the teach back method. to the patient/family/caregiver(s).  Data Reviewed/Counseling: I have reviwed the patient's vital signs, nursing notes, and other relevant tests/information. I had a detailed discussion regarding the historical points, exam findings, and any diagnostic results supporting the discharge diagnosis. I also discussed the need for outpatient follow-up and the need to return to the ED if symptoms worsen or if there are any questions or concerns that arise at home.   PDMP reviewed during this encounter.  Final Clinical Impression(s) / ED Diagnoses Final diagnoses:  Ureterolithiasis    Rx / DC Orders ED Discharge Orders     None         Arthor Captain,  PA-C 10/09/22 1658    Vanetta Mulders, MD 10/12/22 [phone]

## 2022-10-07 NOTE — Discharge Instructions (Signed)
Contact a health care provider if:  You have pain that gets worse or does not get better with medicine.  Get help right away if:  You have a fever or chills.  You develop severe pain.  You develop new abdominal pain.  You faint.  You are unable to urinate.

## 2022-10-07 NOTE — ED Triage Notes (Signed)
Pt via POV c/o right flank pain and bladder pain since 2 days PTA. Hx renal stones and states this feels the same. Pt is nauseated w/o vomiting and reports hematuria, frequency, dysuria. No fevers.

## 2022-10-08 MED FILL — Oxycodone w/ Acetaminophen Tab 5-325 MG: ORAL | Qty: 6 | Status: AC

## 2022-10-19 ENCOUNTER — Emergency Department (HOSPITAL_COMMUNITY)
Admission: EM | Admit: 2022-10-19 | Discharge: 2022-10-19 | Disposition: A | Discharge status: Home / Self Care | Payer: Managed Care, Other (non HMO) | Attending: Emergency Medicine | Admitting: Emergency Medicine

## 2022-10-19 ENCOUNTER — Encounter (HOSPITAL_COMMUNITY): Payer: Self-pay

## 2022-10-19 ENCOUNTER — Other Ambulatory Visit: Payer: Self-pay

## 2022-10-19 ENCOUNTER — Emergency Department (HOSPITAL_COMMUNITY): Payer: Managed Care, Other (non HMO)

## 2022-10-19 DIAGNOSIS — N202 Calculus of kidney with calculus of ureter: Secondary | ICD-10-CM | POA: Diagnosis not present

## 2022-10-19 DIAGNOSIS — R1030 Lower abdominal pain, unspecified: Secondary | ICD-10-CM | POA: Diagnosis present

## 2022-10-19 DIAGNOSIS — N23 Unspecified renal colic: Secondary | ICD-10-CM

## 2022-10-19 LAB — CBC WITH DIFFERENTIAL/PLATELET
Abs Immature Granulocytes: 0.02 10*3/uL (ref 0.00–0.07)
Basophils Absolute: 0.1 10*3/uL (ref 0.0–0.1)
Basophils Relative: 1 %
Eosinophils Absolute: 0.4 10*3/uL (ref 0.0–0.5)
Eosinophils Relative: 5 %
HCT: 45.4 % (ref 39.0–52.0)
Hemoglobin: 15.6 g/dL (ref 13.0–17.0)
Immature Granulocytes: 0 %
Lymphocytes Relative: 33 %
Lymphs Abs: 2.6 10*3/uL (ref 0.7–4.0)
MCH: 33.4 pg (ref 26.0–34.0)
MCHC: 34.4 g/dL (ref 30.0–36.0)
MCV: 97.2 fL (ref 80.0–100.0)
Monocytes Absolute: 0.6 10*3/uL (ref 0.1–1.0)
Monocytes Relative: 8 %
Neutro Abs: 4.2 10*3/uL (ref 1.7–7.7)
Neutrophils Relative %: 53 %
Platelets: 311 10*3/uL (ref 150–400)
RBC: 4.67 MIL/uL (ref 4.22–5.81)
RDW: 12.3 % (ref 11.5–15.5)
WBC: 7.8 10*3/uL (ref 4.0–10.5)
nRBC: 0 % (ref 0.0–0.2)

## 2022-10-19 LAB — URINALYSIS, ROUTINE W REFLEX MICROSCOPIC
Bilirubin Urine: NEGATIVE
Glucose, UA: NEGATIVE mg/dL
Ketones, ur: NEGATIVE mg/dL
Leukocytes,Ua: NEGATIVE
Nitrite: NEGATIVE
Protein, ur: NEGATIVE mg/dL
RBC / HPF: >50 RBC/hpf (ref 0–5)
Specific Gravity, Urine: 1.019 (ref 1.005–1.030)
pH: 6 (ref 5.0–8.0)

## 2022-10-19 LAB — BASIC METABOLIC PANEL
Anion gap: 7 (ref 5–15)
BUN: 8 mg/dL (ref 6–20)
CO2: 23 mmol/L (ref 22–32)
Calcium: 8.8 mg/dL — ABNORMAL LOW (ref 8.9–10.3)
Chloride: 107 mmol/L (ref 98–111)
Creatinine, Ser: 0.76 mg/dL (ref 0.61–1.24)
GFR, Estimated: >60 mL/min (ref >60)
Glucose, Bld: 85 mg/dL (ref 70–99)
Potassium: 3.7 mmol/L (ref 3.5–5.1)
Sodium: 137 mmol/L (ref 135–145)

## 2022-10-19 MED ORDER — OXYCODONE-ACETAMINOPHEN 5-325 MG PO TABS: 1.0000 | ORAL_TABLET | Freq: Four times a day (QID) | ORAL | 0 refills | Status: DC | PRN

## 2022-10-19 MED ORDER — HYDROMORPHONE HCL 1 MG/ML IJ SOLN
1.0000 mg | Freq: Once | INTRAMUSCULAR | Status: AC
  Administered 2022-10-19: 1 mg via INTRAVENOUS
  Filled 2022-10-19: qty 1

## 2022-10-19 MED ORDER — OXYCODONE-ACETAMINOPHEN 5-325 MG PO TABS
1.0000 | ORAL_TABLET | Freq: Once | ORAL | Status: AC
  Administered 2022-10-19: 1 via ORAL
  Filled 2022-10-19: qty 1

## 2022-10-19 MED ORDER — KETOROLAC TROMETHAMINE 10 MG PO TABS: 10.0000 mg | ORAL_TABLET | Freq: Four times a day (QID) | ORAL | 0 refills | Status: DC | PRN

## 2022-10-19 MED ORDER — KETOROLAC TROMETHAMINE 15 MG/ML IJ SOLN
15.0000 mg | Freq: Once | INTRAMUSCULAR | Status: AC
  Administered 2022-10-19: 15 mg via INTRAVENOUS
  Filled 2022-10-19: qty 1

## 2022-10-19 NOTE — Discharge Instructions (Signed)
You were seen for continued pain from kidney stone.  Your workup today was reassuring.  Is importantly follow-up with your urologist for your continued pain.  Take the Flomax you have at home as prescribed.  Come back to the ER if you have worsening pain, vomiting, fever or other worrisome changes.

## 2022-10-19 NOTE — ED Provider Notes (Signed)
[place] EMERGENCY DEPARTMENT AT [facility] Provider Note   CSN: [id number] Arrival date & time: 10/19/22  1249     History  Chief Complaint  Patient presents with   Flank Pain    Jorge Nichols is a 32 y.o. male.  He has PMH consistent with kidney stones in the past, presents the ER today complaining of suprapubic pain.  Diagnosed with 4 mm right ureteral calculus at [facility] on 7/17 in this ED.  Dates he had persistent pain, has moved and is more suprapubic now than right-sided.  Feels like prior kidney stones.  States he does not normally follow with urology because his kidney stones usually pass relatively quickly but he has not passed the stone yet.  He states he had been given some oxycodone for home use but only with mild relief of his pain.  He denies vomiting, diarrhea, fevers or chills.  Denies dysuria but does report hematuria.   Flank Pain       Home Medications Prior to Admission medications   Medication Sig Start Date End Date Taking? Authorizing Provider  ibuprofen (ADVIL) 200 MG tablet Take 400 mg by mouth every 6 (six) hours as needed for moderate pain.    [provider]  ketorolac (TORADOL) 10 MG tablet Take 1 tablet (10 mg total) by mouth every 6 (six) hours as needed. 10/07/22   Arthor Captain, PA-C  metoprolol succinate (TOPROL-XL) 25 MG 24 hr tablet Take 25 mg by mouth every morning. 05/15/22   [provider]  ondansetron (ZOFRAN) 4 MG tablet Take 1 tablet (4 mg total) by mouth every 6 (six) hours. As needed for nausea/vomiting 04/07/22   Triplett, Tammy, PA-C  oxyCODONE (ROXICODONE) 5 MG immediate release tablet Take 1 tablet (5 mg total) by mouth every 4 (four) hours as needed for severe pain. 10/07/22   Arthor Captain, PA-C  oxyCODONE-acetaminophen (PERCOCET/ROXICET) 5-325 MG tablet Take 1 tablet by mouth every 6 (six) hours as needed for severe pain. 08/27/22   Teressa Lower, PA-C  oxyCODONE-acetaminophen (PERCOCET/ROXICET) 5-325  MG tablet Take 1 tablet by mouth every 6 (six) hours as needed for severe pain. 10/07/22   Arthor Captain, PA-C  promethazine (PHENERGAN) 25 MG tablet Take 1 tablet (25 mg total) by mouth every 6 (six) hours as needed for nausea or vomiting. 10/07/22   Arthor Captain, PA-C  tamsulosin (FLOMAX) 0.4 MG CAPS capsule Take 1 capsule (0.4 mg total) by mouth daily. 08/27/22   Teressa Lower, PA-C  tamsulosin (FLOMAX) 0.4 MG CAPS capsule Take 1 capsule (0.4 mg total) by mouth 2 (two) times daily. 10/07/22   Arthor Captain, PA-C      Allergies    Patient has no known allergies.    Review of Systems   Review of Systems  Genitourinary:  Positive for flank pain.    Physical Exam Updated Vital Signs BP (!) 134/96   Pulse 74   Temp 98.9 F (37.2 C) (Oral)   Resp (!) 22   Ht 5\' 8"  (1.727 m)   Wt 113.4 kg   SpO2 99%   BMI 38.01 kg/m  Physical Exam Vitals and nursing note reviewed.  Constitutional:      General: He is not in acute distress.    Appearance: He is well-developed.  HENT:     Head: Normocephalic and atraumatic.     Mouth/Throat:     Mouth: Mucous membranes are moist.  Eyes:     Conjunctiva/sclera: Conjunctivae normal.  Cardiovascular:  Rate and Rhythm: Normal rate and regular rhythm.     Heart sounds: No murmur heard. Pulmonary:     Effort: Pulmonary effort is normal. No respiratory distress.     Breath sounds: Normal breath sounds.  Abdominal:     Palpations: Abdomen is soft.     Tenderness: There is abdominal tenderness in the suprapubic area.  Musculoskeletal:        General: No swelling. Normal range of motion.     Cervical back: Neck supple.  Skin:    General: Skin is warm and dry.     Capillary Refill: Capillary refill takes less than 2 seconds.  Neurological:     General: No focal deficit present.     Mental Status: He is alert and oriented to person, place, and time.  Psychiatric:        Mood and Affect: Mood normal.     ED Results / Procedures /  Treatments   Labs (all labs ordered are listed, but only abnormal results are displayed) Labs Reviewed  URINALYSIS, ROUTINE W REFLEX MICROSCOPIC  CBC WITH DIFFERENTIAL/PLATELET  BASIC METABOLIC PANEL    EKG None  Radiology No results found.  Procedures Procedures    Medications Ordered in ED Medications  ketorolac (TORADOL) 15 MG/ML injection 15 mg (has no administration in time range)    ED Course/ Medical Decision Making/ A&P Clinical Course as of 10/19/22 1625  Mon Oct 19, 2022  1624 Having persistent pain from right ureteral stone that has not passed, started about 2 weeks ago, has not followed up with his urologist to get.  Pain not controlled with Toradol, much better after a dose of Dilaudid.  Ultrasound shows no hydronephrosis.  Renal function is normal.  No UTI.  Will plan on pain control and have him follow-up closely with his urologist [CB]    Clinical Course User Index [CB] Ma Rings, PA-C                             Medical Decision Making This patient presents to the ED for concern of right suprapubic pain, this involves an extensive number of treatment options, and is a complaint that carries with it a high risk of complications and morbidity.  The differential diagnosis includes UTI, nephrolithiasis, STI, testicular torsion, appendicitis, prostatitis, other   Co morbidities that complicate the patient evaluation  Bilateral kidney stones   Additional history obtained:  Additional history obtained from EMR External records from outside source obtained and reviewed including notes, radiology reports, labs   Lab Tests:  I Ordered, and personally interpreted labs.  The pertinent results include: Greater than 50 red blood cells and UA otherwise normal, labs overall reassuring    Problem List / ED Course / Critical interventions / Medication management  Right suprapubic pain-has known kidney stone, pain has migrated slightly lower but patient  still does not pass the stone, he has had no hydronephrosis.  Toradol did not help his pain but he is feeling much better now after Dilaudid.  Plan outpatient follow-up with urology closely, pain medicine for home and strict return precautions.  Due to severe pain or rectal pain suggest torsion or prostatitis, urinalysis is negative ruling out a UTI.  He is well-appearing, do not suspect more serious intra-abdominal process such as dissection, appendicitis, intra-abdominal abscess.  Discussed with patient risk of radiation, will do ultrasound today to decrease radiation exposure given his known kidney stone I  ordered medication including Toradol, Dilaudid for pain Reevaluation of the patient after these medicines showed that the patient improved I have reviewed the patients home medicines and have made adjustments as needed    Test / Admission - Considered:  I considered renal stone CT, but patient is well-appearing, has had numerous CTs, most recently had one 2 weeks ago that showed a 4 mm right distal stone. Discussed risks and benefits with patient and family discussing risk of  radiation exposure and patient was agreeable with ultrasound and KUB listed    Amount and/or Complexity of Data Reviewed External Data Reviewed: labs, radiology and notes. Labs: ordered.    Details: Hematuria with no evidence of UTI and urinalysis, BMP shows normal renal function, normal electrolytes, CBC is normal Radiology: ordered and independent interpretation performed.    Details: Renal ultrasound shows no hydronephrosis, 4 mm stone in left kidney that is nonobstructing, I agree with radiology read KUB shows no obvious radiopaque stones along the expected course of the ureter, bilateral hip replacements noted  Risk Prescription drug management.          Final Clinical Impression(s) / ED Diagnoses Final diagnoses:  None    Rx / DC Orders ED Discharge Orders     None         Josem Kaufmann 10/19/22 1750    Terrilee Files, MD 10/19/22 [phone]

## 2022-10-19 NOTE — ED Triage Notes (Signed)
Pt with right side flank pain and per pt diagnosed with kidney stone at this facility last week.  Pt reports still having pain and has not passed stone yet.

## 2022-10-22 ENCOUNTER — Ambulatory Visit: Payer: Managed Care, Other (non HMO) | Admitting: Urology

## 2022-10-22 VITALS — BP 157/100 | HR 97 | Ht 68.0 in | Wt 250.0 lb

## 2022-10-22 DIAGNOSIS — N201 Calculus of ureter: Secondary | ICD-10-CM | POA: Diagnosis not present

## 2022-10-22 DIAGNOSIS — R82991 Hypocitraturia: Secondary | ICD-10-CM

## 2022-10-22 DIAGNOSIS — N200 Calculus of kidney: Secondary | ICD-10-CM

## 2022-10-22 LAB — URINALYSIS, ROUTINE W REFLEX MICROSCOPIC
Bilirubin, UA: NEGATIVE
Glucose, UA: NEGATIVE
Ketones, UA: NEGATIVE
Leukocytes,UA: NEGATIVE
Nitrite, UA: NEGATIVE
Protein,UA: NEGATIVE
Specific Gravity, UA: 1.025 (ref 1.005–1.030)
Urobilinogen, Ur: 0.2 mg/dL (ref 0.2–1.0)
pH, UA: 6 (ref 5.0–7.5)

## 2022-10-22 LAB — MICROSCOPIC EXAMINATION
Bacteria, UA: NONE SEEN
RBC, Urine: >30 /hpf — AB (ref 0–2)

## 2022-10-22 MED ORDER — POTASSIUM CITRATE ER 10 MEQ (1080 MG) PO TBCR
10.0000 meq | EXTENDED_RELEASE_TABLET | Freq: Three times a day (TID) | ORAL | 3 refills | Status: DC
Start: 2022-10-22 — End: 2023-05-03

## 2022-10-22 MED ORDER — PROMETHAZINE HCL 25 MG PO TABS
25.0000 mg | ORAL_TABLET | Freq: Four times a day (QID) | ORAL | 1 refills | Status: AC | PRN
Start: 2022-10-22 — End: ?

## 2022-10-22 MED ORDER — OXYCODONE HCL 5 MG PO TABS
5.0000 mg | ORAL_TABLET | ORAL | 0 refills | Status: DC | PRN
Start: 2022-10-22 — End: 2022-11-02

## 2022-10-22 NOTE — Progress Notes (Signed)
Subjective: 1. Calculus of ureter   2. Hypocitraturia   3. Hypocitraturic calcium nephrolithiasis      Consult requested by ER  10/22/22: Jorge Nichols returns today in f/u.  He has had several stones over the last few months and was in the ER on 7/29 for right flank pain and was found to have a right distal stone on KUB along with 2 small left renal stones. He thinks he may have passed the stone.  His Cr was normal.  His prior stone was calcium oxalate.  He had a 24 hr urine showed potassium citrate.  He had an elevated PTH in the past and got put on Vit D.  His recent Ca was 8.8.     08/28/21: Jorge Nichols is a former patient with a history of stones who I last saw in 2018.  He returns today with recurrent urolithiasis and he has been trying to pass a stone for the last two month and it was distal on the last CT in May.  He has several left distal stones with a 7x37mm distal lead fragment. His pain has moved to the midline.  He is on tamsulosin.  The pain is intermittent.  He has had some hematuria.    ROS:  Review of Systems  All other systems reviewed and are negative.   No Known Allergies  Past Medical History:  Diagnosis Date   Anxiety    Chronic headaches    Chronic hip pain    Hematuria    History of avascular necrosis of capital femoral epiphysis    bilateral hip s/p  replacement's   Hypertension    Mixed dyslipidemia    Nephrolithiasis    bilateral  non-obstuctive per ct 12-25-2015   Other hyperparathyroidism (HCC)    Right ureteral stone    Vitamin D deficiency    Weak urinary stream     Past Surgical History:  Procedure Laterality Date   CYSTOSCOPY WITH URETEROSCOPY, STONE BASKETRY AND STENT PLACEMENT Right 01/02/2016   Procedure: CYSTOSCOPY WITH RIGHT URETEROSCOPY, STONE EXTRACTION  AND STENT PLACEMENT;  Surgeon: Bjorn Pippin, MD;  Location: [facility];  Service: Urology;  Laterality: Right;   EXTRACORPOREAL SHOCK WAVE LITHOTRIPSY  yrs ago   HIP SURGERY Bilateral  2015   HOLMIUM LASER APPLICATION Right 01/02/2016   Procedure: HOLMIUM LASER APPLICATION;  Surgeon: Bjorn Pippin, MD;  Location[facility];  Service: Urology;  Laterality: Right;   TOTAL HIP ARTHROPLASTY Bilateral 2015   URETEROLITHOTOMY  x2  last one 2007    Social History   Socioeconomic History   Marital status: Married    Spouse name: Not on file   Number of children: Not on file   Years of education: Not on file   Highest education level: Not on file  Occupational History   Not on file  Tobacco Use   Smoking status: Every Day    Current packs/day: 0.50    Average packs/day: 0.5 packs/day for 15.0 years (7.5 ttl pk-yrs)    Types: Cigarettes   Smokeless tobacco: Current    Types: Snuff  Vaping Use   Vaping status: Never Used  Substance and Sexual Activity   Alcohol use: Not Currently    Comment: occassionally   Drug use: No   Sexual activity: Not on file  Other Topics Concern   Not on file  Social History Narrative   Not on file   Social Determinants of Health   Financial Resource Strain: Not on file  Food Insecurity: Not on file  Transportation Needs: Not on file  Physical Activity: Not on file  Stress: Not on file  Social Connections: Not on file  Intimate Partner Violence: Not on file    Family History  Problem Relation Age of Onset   Hypertension Father     Anti-infectives: Anti-infectives (From admission, onward)    None       Current Outpatient Medications  Medication Sig Dispense Refill   ibuprofen (ADVIL) 200 MG tablet Take 400 mg by mouth every 6 (six) hours as needed for moderate pain.     ketorolac (TORADOL) 10 MG tablet Take 1 tablet (10 mg total) by mouth every 6 (six) hours as needed. 20 tablet 0   metoprolol succinate (TOPROL-XL) 25 MG 24 hr tablet Take 25 mg by mouth every morning.     ondansetron (ZOFRAN) 4 MG tablet Take 1 tablet (4 mg total) by mouth every 6 (six) hours. As needed for nausea/vomiting 12 tablet 0    oxyCODONE-acetaminophen (PERCOCET/ROXICET) 5-325 MG tablet Take 1 tablet by mouth every 6 (six) hours as needed for severe pain. 10 tablet 0   potassium citrate (UROCIT-K) 10 MEQ (1080 MG) SR tablet Take 1 tablet (10 mEq total) by mouth 3 (three) times daily with meals. 270 tablet 3   tamsulosin (FLOMAX) 0.4 MG CAPS capsule Take 1 capsule (0.4 mg total) by mouth daily. 30 capsule 0   oxyCODONE (ROXICODONE) 5 MG immediate release tablet Take 1 tablet (5 mg total) by mouth every 4 (four) hours as needed for severe pain. 20 tablet 0   promethazine (PHENERGAN) 25 MG tablet Take 1 tablet (25 mg total) by mouth every 6 (six) hours as needed for nausea or vomiting. 20 tablet 1   No current facility-administered medications for this visit.     Objective: Vital signs in last 24 hours: BP (!) 157/100   Pulse 97   Ht 5\' 8"  (1.727 m)   Wt 250 lb (113.4 kg)   BMI 38.01 kg/m   Intake/Output from previous day: No intake/output data recorded. Intake/Output this shift: @IOTHISSHIFT @   Physical Exam Vitals reviewed.  Constitutional:      Appearance: Normal appearance.  Neurological:     Mental Status: He is alert.     Lab Results:  Results for orders placed or performed in visit on 10/22/22 (from the past 24 hour(s))  Urinalysis, Routine w reflex microscopic     Status: Abnormal   Collection Time: 10/22/22  9:10 AM  Result Value Ref Range   Specific Gravity, UA 1.025 1.005 - 1.030   pH, UA 6.0 5.0 - 7.5   Color, UA Yellow Yellow   Appearance Ur Cloudy (A) Clear   Leukocytes,UA Negative Negative   Protein,UA Negative Negative/Trace   Glucose, UA Negative Negative   Ketones, UA Negative Negative   RBC, UA 3+ (A) Negative   Bilirubin, UA Negative Negative   Urobilinogen, Ur 0.2 0.2 - 1.0 mg/dL   Nitrite, UA Negative Negative   Microscopic Examination See below:    Narrative   Performed at:  [address] [facility] [address], [place], [place]  [id number] Lab Director:  Chinita Pester MT, Phone:  [phone]  Microscopic Examination     Status: Abnormal   Collection Time: 10/22/22  9:10 AM   Urine  Result Value Ref Range   WBC, UA 0-5 0 - 5 /hpf   RBC, Urine >30 (A) 0 - 2 /hpf   Epithelial Cells (non renal) 0-10  0 - 10 /hpf   Mucus, UA Present (A) Not Estab.   Bacteria, UA None seen None seen/Few   Narrative   Performed at:  [address] [facility] [address] [place], [place]  [id number] Lab Director: Chinita Pester MT, Phone:  [phone]     Recent Results (from the past 2160 hour(s))  CBC with Differential     Status: None   Collection Time: 08/27/22  6:35 PM  Result Value Ref Range   WBC 8.4 4.0 - 10.5 K/uL   RBC 5.01 4.22 - 5.81 MIL/uL   Hemoglobin 16.4 13.0 - 17.0 g/dL   HCT 56.2 13.0 - 86.5 %   MCV 94.8 80.0 - 100.0 fL   MCH 32.7 26.0 - 34.0 pg   MCHC 34.5 30.0 - 36.0 g/dL   RDW 78.4 69.6 - 29.5 %   Platelets 333 150 - 400 K/uL   nRBC 0.0 0.0 - 0.2 %   Neutrophils Relative % 52 %   Neutro Abs 4.4 1.7 - 7.7 K/uL   Lymphocytes Relative 34 %   Lymphs Abs 2.9 0.7 - 4.0 K/uL   Monocytes Relative 8 %   Monocytes Absolute 0.7 0.1 - 1.0 K/uL   Eosinophils Relative 5 %   Eosinophils Absolute 0.4 0.0 - 0.5 K/uL   Basophils Relative 1 %   Basophils Absolute 0.1 0.0 - 0.1 K/uL   Immature Granulocytes 0 %   Abs Immature Granulocytes 0.03 0.00 - 0.07 K/uL    Comment: Performed at [facility], [address], [place], [place] [address]  Basic metabolic panel     Status: Abnormal   Collection Time: 08/27/22  6:35 PM  Result Value Ref Range   Sodium 138 135 - 145 mmol/L   Potassium 3.6 3.5 - 5.1 mmol/L   Chloride 108 98 - 111 mmol/L   CO2 22 22 - 32 mmol/L   Glucose, Bld 109 (H) 70 - 99 mg/dL    Comment: Glucose reference range applies only to samples taken after fasting for at least 8 hours.   BUN 10 6 - 20 mg/dL   Creatinine, Ser 2.44 0.61 - 1.24 mg/dL   Calcium 9.2 8.9 - 01.0 mg/dL   GFR, Estimated >27 >25 mL/min    Comment:  (NOTE) Calculated using the CKD-EPI Creatinine Equation (2021)    Anion gap 8 5 - 15    Comment: Performed at [facility], [address], [place], [place] [address]  Urinalysis, Routine w reflex microscopic -Urine, Clean Catch     Status: Abnormal   Collection Time: 08/27/22  6:35 PM  Result Value Ref Range   Color, Urine YELLOW YELLOW   APPearance HAZY (A) CLEAR   Specific Gravity, Urine 1.024 1.005 - 1.030   pH 6.0 5.0 - 8.0   Glucose, UA NEGATIVE NEGATIVE mg/dL   Hgb urine dipstick LARGE (A) NEGATIVE   Bilirubin Urine NEGATIVE NEGATIVE   Ketones, ur 5 (A) NEGATIVE mg/dL   Protein, ur NEGATIVE NEGATIVE mg/dL   Nitrite NEGATIVE NEGATIVE   Leukocytes,Ua NEGATIVE NEGATIVE   RBC / HPF >50 0 - 5 RBC/hpf   WBC, UA 0-5 0 - 5 WBC/hpf   Bacteria, UA RARE (A) NONE SEEN   Squamous Epithelial / HPF 0-5 0 - 5 /HPF   Mucus PRESENT    Ca Oxalate Crys, UA PRESENT     Comment: Performed at [facility], [address] [place], [place] [address]  Urinalysis, Routine w reflex microscopic -Urine, Clean Catch  Status: Abnormal   Collection Time: 10/07/22  7:02 PM  Result Value Ref Range   Color, Urine YELLOW YELLOW   APPearance CLEAR CLEAR   Specific Gravity, Urine 1.024 1.005 - 1.030   pH 5.0 5.0 - 8.0   Glucose, UA NEGATIVE NEGATIVE mg/dL   Hgb urine dipstick NEGATIVE NEGATIVE   Bilirubin Urine NEGATIVE NEGATIVE   Ketones, ur NEGATIVE NEGATIVE mg/dL   Protein, ur 30 (A) NEGATIVE mg/dL   Nitrite NEGATIVE NEGATIVE   Leukocytes,Ua NEGATIVE NEGATIVE   RBC / HPF 6-10 0 - 5 RBC/hpf   WBC, UA 6-10 0 - 5 WBC/hpf   Bacteria, UA RARE (A) NONE SEEN   Squamous Epithelial / HPF 0-5 0 - 5 /HPF   WBC Clumps PRESENT    Mucus PRESENT     Comment: Performed at [facility], [address] [place], [place] [address]  Basic metabolic panel     Status: None   Collection Time: 10/07/22  7:12 PM  Result Value Ref Range   Sodium 135 135 - 145 mmol/L   Potassium 3.9 3.5 - 5.1 mmol/L   Chloride 102 98 -  111 mmol/L   CO2 23 22 - 32 mmol/L   Glucose, Bld 97 70 - 99 mg/dL    Comment: Glucose reference range applies only to samples taken after fasting for at least 8 hours.   BUN 14 6 - 20 mg/dL   Creatinine, Ser 8.46 0.61 - 1.24 mg/dL   Calcium 9.2 8.9 - 96.2 mg/dL   GFR, Estimated >95 >28 mL/min    Comment: (NOTE) Calculated using the CKD-EPI Creatinine Equation (2021)    Anion gap 10 5 - 15    Comment: Performed at [facility], [address] [place], [place] [address]  CBC     Status: None   Collection Time: 10/07/22  7:12 PM  Result Value Ref Range   WBC 10.1 4.0 - 10.5 K/uL   RBC 4.92 4.22 - 5.81 MIL/uL   Hemoglobin 16.4 13.0 - 17.0 g/dL   HCT 40.1 02.7 - 25.3 %   MCV 96.5 80.0 - 100.0 fL   MCH 33.3 26.0 - 34.0 pg   MCHC 34.5 30.0 - 36.0 g/dL   RDW 66.4 40.3 - 47.4 %   Platelets 321 150 - 400 K/uL   nRBC 0.0 0.0 - 0.2 %    Comment: Performed at [facility], [address] [place], [place] [address]  CBC with Differential     Status: None   Collection Time: 10/19/22  2:45 PM  Result Value Ref Range   WBC 7.8 4.0 - 10.5 K/uL   RBC 4.67 4.22 - 5.81 MIL/uL   Hemoglobin 15.6 13.0 - 17.0 g/dL   HCT 38.7 56.4 - 33.2 %   MCV 97.2 80.0 - 100.0 fL   MCH 33.4 26.0 - 34.0 pg   MCHC 34.4 30.0 - 36.0 g/dL   RDW 95.1 88.4 - 16.6 %   Platelets 311 150 - 400 K/uL   nRBC 0.0 0.0 - 0.2 %   Neutrophils Relative % 53 %   Neutro Abs 4.2 1.7 - 7.7 K/uL   Lymphocytes Relative 33 %   Lymphs Abs 2.6 0.7 - 4.0 K/uL   Monocytes Relative 8 %   Monocytes Absolute 0.6 0.1 - 1.0 K/uL   Eosinophils Relative 5 %   Eosinophils Absolute 0.4 0.0 - 0.5 K/uL   Basophils Relative 1 %   Basophils Absolute 0.1 0.0 - 0.1 K/uL  Immature Granulocytes 0 %   Abs Immature Granulocytes 0.02 0.00 - 0.07 K/uL    Comment: Performed at [facility], [address] [place], [place] [address]  Basic metabolic panel     Status: Abnormal   Collection Time: 10/19/22  2:45 PM  Result Value Ref Range   Sodium 137  135 - 145 mmol/L   Potassium 3.7 3.5 - 5.1 mmol/L   Chloride 107 98 - 111 mmol/L   CO2 23 22 - 32 mmol/L   Glucose, Bld 85 70 - 99 mg/dL    Comment: Glucose reference range applies only to samples taken after fasting for at least 8 hours.   BUN 8 6 - 20 mg/dL   Creatinine, Ser 6.04 0.61 - 1.24 mg/dL   Calcium 8.8 (L) 8.9 - 10.3 mg/dL   GFR, Estimated >54 >09 mL/min    Comment: (NOTE) Calculated using the CKD-EPI Creatinine Equation (2021)    Anion gap 7 5 - 15    Comment: Performed at [facility], [address], [place], [place] [address]  Urinalysis, Routine w reflex microscopic -Urine, Clean Catch     Status: Abnormal   Collection Time: 10/19/22  3:30 PM  Result Value Ref Range   Color, Urine YELLOW YELLOW   APPearance CLEAR CLEAR   Specific Gravity, Urine 1.019 1.005 - 1.030   pH 6.0 5.0 - 8.0   Glucose, UA NEGATIVE NEGATIVE mg/dL   Hgb urine dipstick MODERATE (A) NEGATIVE   Bilirubin Urine NEGATIVE NEGATIVE   Ketones, ur NEGATIVE NEGATIVE mg/dL   Protein, ur NEGATIVE NEGATIVE mg/dL   Nitrite NEGATIVE NEGATIVE   Leukocytes,Ua NEGATIVE NEGATIVE   RBC / HPF >50 0 - 5 RBC/hpf   WBC, UA 0-5 0 - 5 WBC/hpf   Bacteria, UA RARE (A) NONE SEEN   Squamous Epithelial / HPF 0-5 0 - 5 /HPF   Mucus PRESENT     Comment: Performed at [facility], [address] [place], [place] [address]  Urinalysis, Routine w reflex microscopic     Status: Abnormal   Collection Time: 10/22/22  9:10 AM  Result Value Ref Range   Specific Gravity, UA 1.025 1.005 - 1.030   pH, UA 6.0 5.0 - 7.5   Color, UA Yellow Yellow   Appearance Ur Cloudy (A) Clear   Leukocytes,UA Negative Negative   Protein,UA Negative Negative/Trace   Glucose, UA Negative Negative   Ketones, UA Negative Negative   RBC, UA 3+ (A) Negative   Bilirubin, UA Negative Negative   Urobilinogen, Ur 0.2 0.2 - 1.0 mg/dL   Nitrite, UA Negative Negative   Microscopic Examination See below:     Comment: Microscopic was indicated and was  performed.  Microscopic Examination     Status: Abnormal   Collection Time: 10/22/22  9:10 AM   Urine  Result Value Ref Range   WBC, UA 0-5 0 - 5 /hpf   RBC, Urine >30 (A) 0 - 2 /hpf   Epithelial Cells (non renal) 0-10 0 - 10 /hpf   Mucus, UA Present (A) Not Estab.   Bacteria, UA None seen None seen/Few    BMET No results for input(s): "NA", "K", "CL", "CO2", "GLUCOSE", "BUN", "CREATININE", "CALCIUM" in the last 72 hours.  PT/INR No results for input(s): "LABPROT", "INR" in the last 72 hours. ABG No results for input(s): "PHART", "HCO3" in the last 72 hours.  Invalid input(s): "PCO2", "PO2"  UA today has >30 RBC's  Studies/Results: No results found. DG Abdomen 1 View  Result Date: 10/19/2022 CLINICAL DATA:  Right flank pain. EXAM: ABDOMEN - 1 VIEW COMPARISON:  December 07, 2021 FINDINGS: The bowel gas pattern is normal. 2 mm soft tissue calcifications are seen projecting over the lower pole of the left kidney. Intact bilateral total hip replacements are noted. IMPRESSION: 2 mm nonobstructing left renal calculi. Electronically Signed   By: Aram Candela M.D.   On: 10/19/2022 17:50   US Renal  Result Date: 10/19/2022 CLINICAL DATA:  Right flank pain for 1 month. History of ureteral calculus. EXAM: RENAL / URINARY TRACT ULTRASOUND COMPLETE COMPARISON:  There are subcortical CT 10/07/2022 FINDINGS: Right Kidney: Renal measurements: 11.7 cm = volume: 223 mL. Echogenicity within normal limits. No mass or hydronephrosis visualized. Left Kidney: Renal measurements: 10.3 cm = volume: 192 mL. Echogenicity within normal limits. No mass or hydronephrosis visualized. 4 mm nonobstructing calculus in the lower pole. Bladder: Appears normal for degree of bladder distention. Other: None. IMPRESSION: 1. No hydronephrosis. 2. 4 mm nonobstructing calculus at the lower pole the left kidney. Electronically Signed   By: Acquanetta Belling M.D.   On: 10/19/2022 16:14   CT RENAL STONE STUDY  Result Date:  10/07/2022 CLINICAL DATA:  Abdominal pain, flank pain EXAM: CT ABDOMEN AND PELVIS WITHOUT CONTRAST TECHNIQUE: Multidetector CT imaging of the abdomen and pelvis was performed following the standard protocol without IV contrast. RADIATION DOSE REDUCTION: This exam was performed according to the departmental dose-optimization program which includes automated exposure control, adjustment of the mA and/or kV according to patient size and/or use of iterative reconstruction technique. COMPARISON:  08/27/2022 FINDINGS: Lower chest: There is no focal consolidation in the lower lung fields. Hepatobiliary: No focal abnormalities are seen liver. There is no dilation of bile ducts. Gallbladder is unremarkable. Pancreas: No focal abnormalities are seen. Spleen: Unremarkable. Adrenals/Urinary Tract: Adrenals are unremarkable. There are few small bilateral renal stones largest measuring 5 mm in size in the midportion of left kidney. There is hydronephrosis. There is 4 mm calculus in the distal course of right ureter at the level of pelvic inlet. In the previous study, the calculus was seen in the proximal right ureter close to the ureteropelvic junction. Evaluation of distal courses of both ureters adjacent to the ureterovesical junction is limited by severe beam hardening artifacts from orthopedic hardware. Urinary bladder is mostly obscured by beam hardening artifacts. Stomach/Bowel: Stomach is unremarkable. Small bowel loops are not dilated. Appendix is not dilated. There is no significant wall thickening in colon. Scattered diverticula are seen in colon without signs of focal diverticulitis. Evaluation of portion of sigmoid is limited by beam hardening artifacts. Vascular/Lymphatic: Unremarkable. Reproductive: Obscured by beam hardening artifacts. Other: There is no ascites or pneumoperitoneum. Small paraumbilical hernia containing fat is seen. Musculoskeletal: There is arthroplasty in both hips. Last lumbar vertebra is  transitional. Degenerative changes are noted in lumbar spine with spinal stenosis and encroachment of neural foramina at multiple levels. IMPRESSION: There is a 4 mm right ureteral calculus at the level of pelvic inlet. There is caudal migration of right ureteral calculus without significant hydronephrosis. There are multiple small bilateral renal stones, more so in the left kidney. Area of ureterovesical junctions and urinary bladder are completely obscured by severe beam hardening artifacts. There is no evidence of intestinal obstruction or pneumoperitoneum. Appendix is not dilated. Few diverticula are seen in colon without signs of focal diverticulitis. Lumbar spondylosis. Paraumbilical hernia containing fat. There is previous arthroplasty in both hips. Electronically Signed   By: Ernie Avena  M.D.   On: 10/07/2022 19:47   CT Renal Stone Study  Result Date: 08/27/2022 CLINICAL DATA:  Abdominal and flank pain, right-sided. EXAM: CT ABDOMEN AND PELVIS WITHOUT CONTRAST TECHNIQUE: Multidetector CT imaging of the abdomen and pelvis was performed following the standard protocol without IV contrast. RADIATION DOSE REDUCTION: This exam was performed according to the departmental dose-optimization program which includes automated exposure control, adjustment of the mA and/or kV according to patient size and/or use of iterative reconstruction technique. COMPARISON:  CT renal stone 04/07/2022 FINDINGS: Lower chest: No acute abnormality. Hepatobiliary: No focal liver abnormality is seen. No gallstones, gallbladder wall thickening, or biliary dilatation. Pancreas: Unremarkable. No pancreatic ductal dilatation or surrounding inflammatory changes. Spleen: Normal in size without focal abnormality. Adrenals/Urinary Tract: Bladder is not well visualized secondary to streak artifact in the pelvis. There is a 2 mm calculus in the proximal right ureter. There is no hydronephrosis. There are additional punctate  nonobstructing bilateral renal calculi. The adrenal glands are within normal limits. Stomach/Bowel: Stomach is within normal limits. Appendix appears normal. No evidence of bowel wall thickening, distention, or inflammatory changes. Vascular/Lymphatic: No significant vascular findings are present. No enlarged abdominal or pelvic lymph nodes. Reproductive: Prostate is unremarkable. Other: No abdominal wall hernia or abnormality. No abdominopelvic ascites. Musculoskeletal: No acute osseous abnormality. Bilateral hip arthroplasties are present. IMPRESSION: 1. 2 mm calculus in the proximal right ureter. No hydronephrosis. 2. Additional punctate nonobstructing bilateral renal calculi. Electronically Signed   By: Darliss Cheney M.D.   On: 08/27/2022 19:26     Assessment/Plan: He has a 3-61mm right distal stone and small LLP stones.    I will refill the pain and nausea meds.   He will return in 4-6 weeks with a KUB.  Hypocitraturia.  I will get him back on potassium citrate.   BMP in 2 weeks.  He may try litholite instead if he can't get the pills down.  Meds ordered this encounter  Medications   potassium citrate (UROCIT-K) 10 MEQ (1080 MG) SR tablet    Sig: Take 1 tablet (10 mEq total) by mouth 3 (three) times daily with meals.    Dispense:  270 tablet    Refill:  3   promethazine (PHENERGAN) 25 MG tablet    Sig: Take 1 tablet (25 mg total) by mouth every 6 (six) hours as needed for nausea or vomiting.    Dispense:  20 tablet    Refill:  1   oxyCODONE (ROXICODONE) 5 MG immediate release tablet    Sig: Take 1 tablet (5 mg total) by mouth every 4 (four) hours as needed for severe pain.    Dispense:  20 tablet    Refill:  0     Orders Placed This Encounter  Procedures   Microscopic Examination   DG Abd 1 View    Standing Status:   Future    Standing Expiration Date:   01/22/2023    Order Specific Question:   Reason for Exam (SYMPTOM  OR DIAGNOSIS REQUIRED)    Answer:   ureteral stone    Order  Specific Question:   Preferred imaging location?    Answer:   [facility]    Order Specific Question:   Radiology Contrast Protocol - do NOT remove file path    Answer:   [email or web address]   Urinalysis, Routine w reflex microscopic   Basic metabolic panel    Standing Status:   Future    Standing Expiration Date:   01/22/2023  Calculi, with Photograph (to Clinical Lab)     Return for 4-6 wks with KUB.  Could see Maralyn Sago. .    CC: Edwin Dada DO.     Bjorn Pippin 10/23/2022 [phone]

## 2022-10-30 ENCOUNTER — Telehealth: Payer: Self-pay

## 2022-10-30 NOTE — Telephone Encounter (Signed)
Patient's mom called in and states that pharmacy did not filled the whole prescription and patient received a few pills. Patient's mom is aware a DPR has to be on file for me to talk to her, mom was made aware I will contact patient. Patient states that he has a 4 mm stone and currently in pain. Patient states he need a refill on his Zofran and oxycodone. Patient is aware a message will be sent to Dr. Annabell Howells on advisement. Patient voiced understanding

## 2022-11-02 ENCOUNTER — Other Ambulatory Visit: Payer: Self-pay | Admitting: Urology

## 2022-11-02 DIAGNOSIS — N201 Calculus of ureter: Secondary | ICD-10-CM

## 2022-11-02 MED ORDER — ONDANSETRON HCL 4 MG PO TABS: 4.0000 mg | ORAL_TABLET | Freq: Four times a day (QID) | ORAL | 0 refills | Status: DC

## 2022-11-02 MED ORDER — OXYCODONE HCL 5 MG PO TABS
5.0000 mg | ORAL_TABLET | ORAL | 0 refills | Status: DC | PRN
Start: 2022-11-02 — End: 2022-11-19

## 2022-11-02 NOTE — Telephone Encounter (Signed)
Tried calling patient with noi answer, left vm for return call "Refills sent"

## 2022-11-03 NOTE — Telephone Encounter (Signed)
Patient returned call to office and notified refills were sent per Dr. Annabell Howells

## 2022-11-04 ENCOUNTER — Encounter (HOSPITAL_COMMUNITY): Payer: Self-pay

## 2022-11-04 ENCOUNTER — Other Ambulatory Visit: Payer: Self-pay

## 2022-11-04 ENCOUNTER — Telehealth: Payer: Self-pay

## 2022-11-04 ENCOUNTER — Emergency Department (HOSPITAL_COMMUNITY)
Admission: EM | Admit: 2022-11-04 | Discharge: 2022-11-04 | Discharge status: Against Medical Advice | Payer: Managed Care, Other (non HMO) | Attending: Emergency Medicine | Admitting: Emergency Medicine

## 2022-11-04 DIAGNOSIS — Z5321 Procedure and treatment not carried out due to patient leaving prior to being seen by health care provider: Secondary | ICD-10-CM | POA: Insufficient documentation

## 2022-11-04 DIAGNOSIS — R110 Nausea: Secondary | ICD-10-CM | POA: Insufficient documentation

## 2022-11-04 DIAGNOSIS — R109 Unspecified abdominal pain: Secondary | ICD-10-CM | POA: Insufficient documentation

## 2022-11-04 LAB — CBC
HCT: 43.1 % (ref 39.0–52.0)
Hemoglobin: 14.8 g/dL (ref 13.0–17.0)
MCH: 33.3 pg (ref 26.0–34.0)
MCHC: 34.3 g/dL (ref 30.0–36.0)
MCV: 96.9 fL (ref 80.0–100.0)
Platelets: 303 10*3/uL (ref 150–400)
RBC: 4.45 MIL/uL (ref 4.22–5.81)
RDW: 11.8 % (ref 11.5–15.5)
WBC: 8.1 10*3/uL (ref 4.0–10.5)
nRBC: 0 % (ref 0.0–0.2)

## 2022-11-04 LAB — COMPREHENSIVE METABOLIC PANEL
ALT: 28 U/L (ref 0–44)
AST: 21 U/L (ref 15–41)
Albumin: 4.1 g/dL (ref 3.5–5.0)
Alkaline Phosphatase: 53 U/L (ref 38–126)
Anion gap: 8 (ref 5–15)
BUN: 15 mg/dL (ref 6–20)
CO2: 24 mmol/L (ref 22–32)
Calcium: 9.4 mg/dL (ref 8.9–10.3)
Chloride: 105 mmol/L (ref 98–111)
Creatinine, Ser: 0.91 mg/dL (ref 0.61–1.24)
GFR, Estimated: >60 mL/min (ref >60)
Glucose, Bld: 100 mg/dL — ABNORMAL HIGH (ref 70–99)
Potassium: 3.9 mmol/L (ref 3.5–5.1)
Sodium: 137 mmol/L (ref 135–145)
Total Bilirubin: 0.4 mg/dL (ref 0.3–1.2)
Total Protein: 7 g/dL (ref 6.5–8.1)

## 2022-11-04 LAB — LIPASE, BLOOD: Lipase: 36 U/L (ref 11–51)

## 2022-11-04 NOTE — ED Notes (Signed)
Pt not answering when called.

## 2022-11-04 NOTE — Telephone Encounter (Signed)
Patient's wife called with concerns for patient care.  She states that the pain medication is not helping the patient.  Patient was instructed to be seen at the emergency room if the pain is too severe.  Her concern is the frequency of patient passing kidney stone and the severity of his pain and having to constantly be seen by the ER for pain medication or stronger pain meds when rx'd narcotic does not help.  She feels that the patient is often dismissed at the ER due to the frequency of having to be seen for kidney stones pain.  Do you have any advice for the patient.

## 2022-11-04 NOTE — ED Triage Notes (Signed)
Pt c/o right sided flank pain. Pt with hx of kidney stones and states that this one has been hurting for a couple weeks. Pt does not think he has passed stone. Endorses nausea.

## 2022-11-05 ENCOUNTER — Emergency Department (HOSPITAL_COMMUNITY)
Admission: EM | Admit: 2022-11-05 | Discharge: 2022-11-05 | Disposition: A | Discharge status: Home / Self Care | Payer: Managed Care, Other (non HMO) | Attending: Emergency Medicine | Admitting: Emergency Medicine

## 2022-11-05 ENCOUNTER — Emergency Department (HOSPITAL_COMMUNITY): Payer: Managed Care, Other (non HMO)

## 2022-11-05 ENCOUNTER — Other Ambulatory Visit: Payer: Self-pay

## 2022-11-05 DIAGNOSIS — N202 Calculus of kidney with calculus of ureter: Secondary | ICD-10-CM | POA: Diagnosis not present

## 2022-11-05 DIAGNOSIS — N23 Unspecified renal colic: Secondary | ICD-10-CM

## 2022-11-05 DIAGNOSIS — R109 Unspecified abdominal pain: Secondary | ICD-10-CM | POA: Diagnosis present

## 2022-11-05 LAB — CBC
HCT: 45.5 % (ref 39.0–52.0)
Hemoglobin: 15.4 g/dL (ref 13.0–17.0)
MCH: 33.2 pg (ref 26.0–34.0)
MCHC: 33.8 g/dL (ref 30.0–36.0)
MCV: 98.1 fL (ref 80.0–100.0)
Platelets: 320 10*3/uL (ref 150–400)
RBC: 4.64 MIL/uL (ref 4.22–5.81)
RDW: 11.8 % (ref 11.5–15.5)
WBC: 8.2 10*3/uL (ref 4.0–10.5)
nRBC: 0 % (ref 0.0–0.2)

## 2022-11-05 LAB — URINALYSIS, ROUTINE W REFLEX MICROSCOPIC
Bacteria, UA: NONE SEEN
Bilirubin Urine: NEGATIVE
Glucose, UA: NEGATIVE mg/dL
Ketones, ur: NEGATIVE mg/dL
Leukocytes,Ua: NEGATIVE
Nitrite: NEGATIVE
Protein, ur: NEGATIVE mg/dL
RBC / HPF: >50 RBC/hpf (ref 0–5)
Specific Gravity, Urine: 1.021 (ref 1.005–1.030)
pH: 7 (ref 5.0–8.0)

## 2022-11-05 LAB — BASIC METABOLIC PANEL
Anion gap: 10 (ref 5–15)
BUN: 13 mg/dL (ref 6–20)
CO2: 24 mmol/L (ref 22–32)
Calcium: 9.3 mg/dL (ref 8.9–10.3)
Chloride: 104 mmol/L (ref 98–111)
Creatinine, Ser: 0.89 mg/dL (ref 0.61–1.24)
GFR, Estimated: >60 mL/min (ref >60)
Glucose, Bld: 90 mg/dL (ref 70–99)
Potassium: 3.8 mmol/L (ref 3.5–5.1)
Sodium: 138 mmol/L (ref 135–145)

## 2022-11-05 MED ORDER — ONDANSETRON HCL 4 MG/2ML IJ SOLN
4.0000 mg | Freq: Once | INTRAMUSCULAR | Status: AC
  Administered 2022-11-05: 4 mg via INTRAVENOUS
  Filled 2022-11-05: qty 2

## 2022-11-05 MED ORDER — SODIUM CHLORIDE 0.9 % IV BOLUS
1000.0000 mL | Freq: Once | INTRAVENOUS | Status: AC
  Administered 2022-11-05: 1000 mL via INTRAVENOUS

## 2022-11-05 MED ORDER — SODIUM CHLORIDE 0.9 % IV SOLN
1.5000 mg/kg | Freq: Once | INTRAVENOUS | Status: AC
  Administered 2022-11-05: 176 mg via INTRAVENOUS
  Filled 2022-11-05: qty 8.8

## 2022-11-05 MED ORDER — HYDROMORPHONE HCL 1 MG/ML IJ SOLN
1.0000 mg | Freq: Once | INTRAMUSCULAR | Status: AC
  Administered 2022-11-05: 1 mg via INTRAVENOUS
  Filled 2022-11-05: qty 1

## 2022-11-05 MED ORDER — OXYCODONE-ACETAMINOPHEN 7.5-325 MG PO TABS: 1.0000 | ORAL_TABLET | Freq: Four times a day (QID) | ORAL | 0 refills | Status: DC | PRN

## 2022-11-05 NOTE — Discharge Instructions (Addendum)
Taking care of you today you are seen for flank pain likely related to kidney stones.  We have treated your pain.  Since the medicine you had at home was not helping we have given you prescription for an increased dose.  Can only give you a small quantity anything else left to come from your urologist.  You have worsening pain, vomiting or any other worrisome changes you need to come back to the ER.  As discussed if you are not getting better we may need to do CT scan and potentially admit you if your pain cannot be controlled. As always with narcotic pain medicine, do not drive or operate machinery of injury to yourself or others

## 2022-11-05 NOTE — ED Provider Notes (Signed)
[place] EMERGENCY DEPARTMENT AT [facility] Provider Note   CSN: [id number] Arrival date & time: 11/05/22  1252     History  Chief Complaint  Patient presents with   Flank Pain    Jorge Nichols is a 32 y.o. male.  Straight frequent kidney stones.  Follows with Dr. Annabell Howells from urology for this.  He was having lower abdominal pain about 2 weeks ago, thinks that stone may have passed but over the past couple days started having right upper flank pain.  He has been taking oxycodone as prescribed by his urologist without relief.  Having nausea.  No vomiting.  States he had gross hematuria today.  He was started on potassium citrate which he has been compliant with and is also taking Flomax.   Flank Pain       Home Medications Prior to Admission medications   Medication Sig Start Date End Date Taking? Authorizing Provider  oxyCODONE-acetaminophen (PERCOCET) 7.5-325 MG tablet Take 1 tablet by mouth every 6 (six) hours as needed for severe pain. 11/05/22  Yes Tarrence Enck A, PA-C  ibuprofen (ADVIL) 200 MG tablet Take 400 mg by mouth every 6 (six) hours as needed for moderate pain.    [provider]  ketorolac (TORADOL) 10 MG tablet Take 1 tablet (10 mg total) by mouth every 6 (six) hours as needed. 10/19/22   Carmel Sacramento A, PA-C  metoprolol succinate (TOPROL-XL) 25 MG 24 hr tablet Take 25 mg by mouth every morning. 05/15/22   [provider]  ondansetron (ZOFRAN) 4 MG tablet Take 1 tablet (4 mg total) by mouth every 6 (six) hours. As needed for nausea/vomiting 11/02/22   Bjorn Pippin, MD  oxyCODONE (ROXICODONE) 5 MG immediate release tablet Take 1 tablet (5 mg total) by mouth every 4 (four) hours as needed for severe pain. 11/02/22   Bjorn Pippin, MD  potassium citrate (UROCIT-K) 10 MEQ (1080 MG) SR tablet Take 1 tablet (10 mEq total) by mouth 3 (three) times daily with meals. 10/22/22   Bjorn Pippin, MD  promethazine (PHENERGAN) 25 MG tablet Take 1 tablet (25  mg total) by mouth every 6 (six) hours as needed for nausea or vomiting. 10/22/22   Bjorn Pippin, MD  tamsulosin (FLOMAX) 0.4 MG CAPS capsule Take 1 capsule (0.4 mg total) by mouth daily. 08/27/22   Teressa Lower, PA-C      Allergies    Patient has no known allergies.    Review of Systems   Review of Systems  Genitourinary:  Positive for flank pain.    Physical Exam Updated Vital Signs BP (!) 126/110   Pulse 73   Temp 98.9 F (37.2 C) (Oral)   Resp 19   Ht 5\' 8"  (1.727 m)   Wt 117.9 kg   SpO2 100%   BMI 39.53 kg/m  Physical Exam Vitals and nursing note reviewed.  Constitutional:      General: He is not in acute distress.    Appearance: He is well-developed. He is obese. He is not toxic-appearing.     Comments: Appears uncomfortable  HENT:     Head: Normocephalic and atraumatic.     Mouth/Throat:     Mouth: Mucous membranes are moist.  Eyes:     Conjunctiva/sclera: Conjunctivae normal.  Cardiovascular:     Rate and Rhythm: Normal rate and regular rhythm.     Heart sounds: No murmur heard. Pulmonary:     Effort: Pulmonary effort is normal. No respiratory distress.  Breath sounds: Normal breath sounds.  Abdominal:     Palpations: Abdomen is soft.     Tenderness: There is no abdominal tenderness.  Musculoskeletal:        General: No swelling. Normal range of motion.     Cervical back: Neck supple.     Comments: Mild right mid back tenderness  Skin:    General: Skin is warm and dry.     Capillary Refill: Capillary refill takes less than 2 seconds.  Neurological:     General: No focal deficit present.     Mental Status: He is alert and oriented to person, place, and time.  Psychiatric:        Mood and Affect: Mood normal.     ED Results / Procedures / Treatments   Labs (all labs ordered are listed, but only abnormal results are displayed) Labs Reviewed  URINALYSIS, ROUTINE W REFLEX MICROSCOPIC - Abnormal; Notable for the following components:      Result  Value   APPearance HAZY (*)    Hgb urine dipstick LARGE (*)    All other components within normal limits  BASIC METABOLIC PANEL  CBC    EKG None  Radiology US Renal  Result Date: 11/05/2022 CLINICAL DATA:  Right flank pain with hematuria. EXAM: RENAL / URINARY TRACT ULTRASOUND COMPLETE COMPARISON:  Renal ultrasound 10/19/2022.  CT 10/07/2022 and older FINDINGS: Right Kidney: Renal measurements: 12.7 x 6.6 x 6.8 cm = volume: 301.08 mL. Echogenicity within normal limits. No mass or hydronephrosis visualized. Left Kidney: Renal measurements: 11.5 x 6.6 x 6.0 cm = volume: 237.93 mL. Echogenicity within normal limits. No mass or hydronephrosis visualized. Echogenic area in the left kidney consistent with known history of stones. Bladder: Appears normal for degree of bladder distention. Other: Overlapping bowel gas and soft tissue limits evaluation. IMPRESSION: No collecting system dilatation. Electronically Signed   By: Karen Kays M.D.   On: 11/05/2022 17:17   DG Abdomen 1 View  Result Date: 11/05/2022 CLINICAL DATA:  Right flank pain EXAM: ABDOMEN - 1 VIEW COMPARISON:  10/19/2022 FINDINGS: The bowel gas pattern is normal. Punctate calcification again seen overlying the lower left renal shadow. No definite calcification over the course of the right kidney or ureter. Bilateral hip prostheses. IMPRESSION: Punctate left renal calculus. No definite calcification over the course of the right kidney or ureter. Electronically Signed   By: Duanne Guess D.O.   On: 11/05/2022 17:03    Procedures Procedures    Medications Ordered in ED Medications  HYDROmorphone (DILAUDID) injection 1 mg (1 mg Intravenous Given 11/05/22 1501)  ondansetron (ZOFRAN) injection 4 mg (4 mg Intravenous Given 11/05/22 1501)  sodium chloride 0.9 % bolus 1,000 mL (0 mLs Intravenous Stopped 11/05/22 1744)  lidocaine (XYLOCAINE) 176 mg in sodium chloride 0.9 % 100 mL IVPB (0 mg Intravenous Stopped 11/05/22 1645)    ED Course/  Medical Decision Making/ A&P                                 Medical Decision Making DDx: Ureteral colic, UTI, muscle strain, other  ED course: Patient has history of kidney stones, following with urology Dr. Annabell Howells.  His prior pain that was lower down had gotten better and he believes he had passed the stone but over the past couple days started having upper flank pain on the right side again and believes he is passing another stone.  He is  compliant with his medications at home, he had called his urologist [facility] and he prescribed oxycodone 5 mg but patient states this is not controlling his pain adequately at home.  He also tried oral Toradol without relief.  He is having hematuria since yesterday.  No fevers or chills.  He has some nausea but not having current vomiting.  No chest pain or shortness of breath.  Prior labs and imaging, patient had numerous CTs in the past  Discussed with patient CT versus x-ray and ultrasound to try to avoid further radiation he is agreeable with this.  Patient's pain was controlled with IV Dilaudid given Toradol as well as an IV lidocaine, feeling much better now, states he feels comfortable going home, he states oxycodone stronger doses of help for better in the past so given Percocet 7.5 mg.  Advised on the fact that we can leave small amount and he is to follow-up closely with urology for further narcotics.  Discussed that if he has worsening pain or fever or vomiting or any other worrisome changes he should come back to the ER.  Discussed if he is getting worse we will need to do CT and if we cannot control his pain we can admit him.  He wants to go home now, requesting work note because he had to leave work today due to the pain.  Amount and/or Complexity of Data Reviewed Labs: ordered.    Details: Renal function, hematuria with no UTI, no leukocytosis Radiology: ordered.    Details: KUB shows no stone or other acute abnormality. Renal ultrasound shows no  hydronephrosis, possible stone overlying the left kidney no right-sided stones noted  Risk Prescription drug management.           Final Clinical Impression(s) / ED Diagnoses Final diagnoses:  Ureteral colic    Rx / DC Orders ED Discharge Orders          Ordered    oxyCODONE-acetaminophen (PERCOCET) 7.5-325 MG tablet  Every 6 hours PRN        11/05/22 1741              Ma Rings, PA-C 11/05/22 1749    Bethann Berkshire, MD 11/06/22 1034

## 2022-11-05 NOTE — ED Triage Notes (Signed)
Pt presents with right flank pain, diagnosed for 4 mm kidney stone in by Urologist last week, given oral Toradol, w/o any relief last dose yesterday.

## 2022-11-05 NOTE — Telephone Encounter (Signed)
Patient's wife is aware of Dr. Belva Crome response and will pick up 24 hour urine paper at the front desk on 08/19.

## 2022-11-19 ENCOUNTER — Telehealth: Payer: Self-pay

## 2022-11-19 ENCOUNTER — Other Ambulatory Visit: Payer: Self-pay | Admitting: Urology

## 2022-11-19 MED ORDER — OXYCODONE-ACETAMINOPHEN 7.5-325 MG PO TABS: 1.0000 | ORAL_TABLET | Freq: Four times a day (QID) | ORAL | 0 refills | Status: DC | PRN

## 2022-11-19 NOTE — Progress Notes (Signed)
Koehn is having more pain.  I will refill the oxycodone APAP 7.5/325 but he will need imaging before anymore since he has gotten many refills in the last few months but he has had documented stones intermittently.

## 2022-11-19 NOTE — Telephone Encounter (Signed)
Patient's wife states that patient is passing a stone and is in a lot pain and is asking if he can have a refill of his pain medication.

## 2022-11-19 NOTE — Telephone Encounter (Signed)
Wife aware, they will follow up as scheduled with Maralyn Sago on 09/05

## 2022-11-22 ENCOUNTER — Other Ambulatory Visit: Payer: Self-pay | Admitting: Urology

## 2022-11-24 NOTE — Progress Notes (Deleted)
Name: Jorge Nichols DOB: 08-07-1990 MRN: [id number]  History of Present Illness: Jorge Nichols is a 32 y.o. male who presents today for follow up visit at [facility] [place]. - GU History: 1. Kidney stones.  - Has had several prior stone procedures including ESWL and ureteroscopic stone manipulation.  - 10/22/2022: Stone analysis showed 100% calcium oxalate composition. - 24 hr urine showed potassium citrate. He had an elevated PTH in the past and got put on Vit D. His recent Ca was 8.8.    Recent history:  > 10/19/2022:  - RUS showed a 4 mm nonobstructing lower pole of left kidney with no hydronephrosis. - KUB showed a right distal ureteral stone.   > 10/22/2022: Seen by Dr. Annabell Howells.  The plan was:  - "I will refill the pain and nausea meds.   He will return in 4-6 weeks with a KUB." - "Hypocitraturia.  I will get him back on potassium citrate.   BMP in 2 weeks.  He may try litholite instead if he can't get the pills down."  Since last visit: 11/05/2022:  - KUB: "Punctate left renal calculus. No definite calcification over the course of the right kidney or ureter." - RUS: Left kidney stone again seen with no hydronephrosis.  Today: KUB today: Awaiting radiology read; ***  He {Actions; denies-reports:120008} recent episode of stone pain / passage. He {Actions; denies-reports:120008} acute flank pain / abdominal pain. He {Actions; denies-reports:120008} fevers.  He {Actions; denies-reports:120008} nausea/ vomiting.  He {Actions; denies-reports:120008} increased urinary urgency, frequency, nocturia, dysuria, gross hematuria, hesitancy, straining to void, or sensations of incomplete emptying.   Fall Screening: Do you usually have a device to assist in your mobility? {yes/no:20286}  ***cane / ***walker / ***wheelchair  Medications: Current Outpatient Medications  Medication Sig Dispense Refill   ibuprofen (ADVIL) 200 MG tablet Take 400 mg by mouth every 6 (six) hours as  needed for moderate pain.     ketorolac (TORADOL) 10 MG tablet Take 1 tablet (10 mg total) by mouth every 6 (six) hours as needed. 20 tablet 0   metoprolol succinate (TOPROL-XL) 25 MG 24 hr tablet Take 25 mg by mouth every morning.     ondansetron (ZOFRAN) 4 MG tablet Take 1 tablet (4 mg total) by mouth every 6 (six) hours. As needed for nausea/vomiting 12 tablet 0   oxyCODONE-acetaminophen (PERCOCET) 7.5-325 MG tablet Take 1 tablet by mouth every 6 (six) hours as needed for severe pain. 15 tablet 0   potassium citrate (UROCIT-K) 10 MEQ (1080 MG) SR tablet Take 1 tablet (10 mEq total) by mouth 3 (three) times daily with meals. 270 tablet 3   promethazine (PHENERGAN) 25 MG tablet Take 1 tablet (25 mg total) by mouth every 6 (six) hours as needed for nausea or vomiting. 20 tablet 1   tamsulosin (FLOMAX) 0.4 MG CAPS capsule TAKE 1 CAPSULE BY MOUTH EVERY DAY 90 capsule 3   No current facility-administered medications for this visit.    Allergies: No Known Allergies  Past Medical History:  Diagnosis Date   Anxiety    Chronic headaches    Chronic hip pain    Hematuria    History of avascular necrosis of capital femoral epiphysis    bilateral hip s/p  replacement's   Hypertension    Mixed dyslipidemia    Nephrolithiasis    bilateral  non-obstuctive per ct 12-25-2015   Other hyperparathyroidism (HCC)    Right ureteral stone    Vitamin D deficiency  Weak urinary stream    Past Surgical History:  Procedure Laterality Date   CYSTOSCOPY WITH URETEROSCOPY, STONE BASKETRY AND STENT PLACEMENT Right 01/02/2016   Procedure: CYSTOSCOPY WITH RIGHT URETEROSCOPY, STONE EXTRACTION  AND STENT PLACEMENT;  Surgeon: Bjorn Pippin, MD;  [facility];  Service: Urology;  Laterality: Right;   EXTRACORPOREAL SHOCK WAVE LITHOTRIPSY  yrs ago   HIP SURGERY Bilateral 2015   HOLMIUM LASER APPLICATION Right 01/02/2016   Procedure: HOLMIUM LASER APPLICATION;  Surgeon: Bjorn Pippin, MD;   Location: [facility];  Service: Urology;  Laterality: Right;   TOTAL HIP ARTHROPLASTY Bilateral 2015   URETEROLITHOTOMY  x2  last one 2007   Family History  Problem Relation Age of Onset   Hypertension Father    Social History   Socioeconomic History   Marital status: Married    Spouse name: Not on file   Number of children: Not on file   Years of education: Not on file   Highest education level: Not on file  Occupational History   Not on file  Tobacco Use   Smoking status: Every Day    Current packs/day: 0.50    Average packs/day: 0.5 packs/day for 15.0 years (7.5 ttl pk-yrs)    Types: Cigarettes   Smokeless tobacco: Current    Types: Snuff  Vaping Use   Vaping status: Never Used  Substance and Sexual Activity   Alcohol use: Not Currently    Comment: occassionally   Drug use: No   Sexual activity: Not on file  Other Topics Concern   Not on file  Social History Narrative   Not on file   Social Determinants of Health   Financial Resource Strain: Not on file  Food Insecurity: Not on file  Transportation Needs: Not on file  Physical Activity: Not on file  Stress: Not on file  Social Connections: Not on file  Intimate Partner Violence: Not on file    SUBJECTIVE  Review of Systems Constitutional: Patient ***denies any unintentional weight loss or change in strength lntegumentary: Patient ***denies any rashes or pruritus Eyes: Patient denies ***dry eyes ENT: Patient ***denies dry mouth Cardiovascular: Patient ***denies chest pain or syncope Respiratory: Patient ***denies shortness of breath Gastrointestinal: Patient ***denies nausea, vomiting, constipation, or diarrhea Musculoskeletal: Patient ***denies muscle cramps or weakness Neurologic: Patient ***denies convulsions or seizures Psychiatric: Patient ***denies memory problems Allergic/Immunologic: Patient ***denies recent allergic reaction(s) Hematologic/Lymphatic: Patient denies bleeding  tendencies Endocrine: Patient ***denies heat/cold intolerance  GU: As per HPI.  OBJECTIVE There were no vitals filed for this visit. There is no height or weight on file to calculate BMI.  Physical Examination Constitutional: ***No obvious distress; patient is ***non-toxic appearing  Cardiovascular: ***No visible lower extremity edema.  Respiratory: The patient does ***not have audible wheezing/stridor; respirations do ***not appear labored  Gastrointestinal: Abdomen ***non-distended Musculoskeletal: ***Normal ROM of UEs  Skin: ***No obvious rashes/open sores  Neurologic: CN 2-12 grossly ***intact Psychiatric: Answered questions ***appropriately with ***normal affect  Hematologic/Lymphatic/Immunologic: ***No obvious bruises or sites of spontaneous bleeding  UA: ***negative / *** WBC/hpf, *** RBC/hpf, bacteria (***) PVR: *** ml  ASSESSMENT No diagnosis found.  ***We reviewed recent imaging results; ***awaiting radiology results, appears to have ***no acute findings.  ***For stone prevention: Advised adequate hydration and we discussed option to consider low oxalate diet given that calcium oxalate is the most common type of stone. Handout provided about stone prevention diet.  ***For recurrent stone formers: We discussed option to proceed with 24 hour  urinalysis ([facility]) for metabolic evaluation, which may help with targeted recommendations for dietary I medication therapies for stone prevention. Patient elected to ***proceed/ ***hold off.  Will plan to follow up in ***6 months / ***1 year with ***KUB ***RUS for stone surveillance or sooner if needed.  Pt verbalized understanding and agreement. All questions were answered.  PLAN Advised the following: ***Maintain adequate fluid intake. ***Low oxalate diet. No follow-ups on file.  No orders of the defined types were placed in this encounter.   It has been explained that the patient is to follow regularly with their PCP in  addition to all other providers involved in their care and to follow instructions provided by these respective offices. Patient advised to contact urology clinic if any urologic-pertaining questions, concerns, new symptoms or problems arise in the interim period.  There are no Patient Instructions on file for this visit.  Electronically signed by:  Donnita Falls, MSN, FNP-C, CUNP 11/24/2022 1:17 PM

## 2022-11-26 ENCOUNTER — Ambulatory Visit: Payer: Managed Care, Other (non HMO) | Admitting: Urology

## 2022-12-22 ENCOUNTER — Encounter: Payer: Self-pay | Admitting: Urology

## 2022-12-22 ENCOUNTER — Ambulatory Visit: Payer: Managed Care, Other (non HMO) | Admitting: Urology

## 2022-12-22 VITALS — BP 133/81 | HR 89 | Temp 98.4°F

## 2022-12-22 DIAGNOSIS — N200 Calculus of kidney: Secondary | ICD-10-CM | POA: Diagnosis not present

## 2022-12-22 DIAGNOSIS — R319 Hematuria, unspecified: Secondary | ICD-10-CM

## 2022-12-22 DIAGNOSIS — E212 Other hyperparathyroidism: Secondary | ICD-10-CM

## 2022-12-22 DIAGNOSIS — G8929 Other chronic pain: Secondary | ICD-10-CM | POA: Insufficient documentation

## 2022-12-22 DIAGNOSIS — M109 Gout, unspecified: Secondary | ICD-10-CM | POA: Insufficient documentation

## 2022-12-22 DIAGNOSIS — R3989 Other symptoms and signs involving the genitourinary system: Secondary | ICD-10-CM

## 2022-12-22 DIAGNOSIS — M8700 Idiopathic aseptic necrosis of unspecified bone: Secondary | ICD-10-CM

## 2022-12-22 DIAGNOSIS — Z8739 Personal history of other diseases of the musculoskeletal system and connective tissue: Secondary | ICD-10-CM

## 2022-12-22 DIAGNOSIS — R110 Nausea: Secondary | ICD-10-CM | POA: Diagnosis not present

## 2022-12-22 DIAGNOSIS — E8881 Metabolic syndrome: Secondary | ICD-10-CM

## 2022-12-22 MED ORDER — PROMETHAZINE HCL 25 MG PO TABS
25.0000 mg | ORAL_TABLET | Freq: Four times a day (QID) | ORAL | 2 refills | Status: DC | PRN
Start: 2022-12-22 — End: 2023-05-03

## 2022-12-22 MED ORDER — OXYCODONE-ACETAMINOPHEN 7.5-325 MG PO TABS
1.0000 | ORAL_TABLET | Freq: Four times a day (QID) | ORAL | 0 refills | Status: AC | PRN
Start: 2022-12-22 — End: ?

## 2022-12-22 NOTE — Patient Instructions (Signed)

## 2022-12-22 NOTE — Progress Notes (Signed)
Name: Jorge Nichols DOB: [birth year] MRN: [id number]  History of Present Illness: Mr. Eklund is a 32 y.o. male who presents today for follow up visit at [facility] [place]. - GU History: 1. Kidney stones.  - Has had several prior stone procedures including ESWL and ureteroscopic stone manipulation.  - 10/22/2022: Stone analysis showed 100% calcium oxalate composition. - 24 hr urine showed potassium citrate. He had an elevated PTH in the past and got put on Vit D. His recent Ca was 8.8.  Recent history:  > 10/19/2022:  - RUS showed a 4 mm nonobstructing lower pole of left kidney with no hydronephrosis. - KUB showed a right distal ureteral stone.   > 10/22/2022: Seen by Dr. Annabell Howells.  The plan was:  - "I will refill the pain and nausea meds.   He will return in 4-6 weeks with a KUB." - "Hypocitraturia.  I will get him back on potassium citrate.   BMP in 2 weeks.  He may try litholite instead if he can't get the pills down."  Since last visit: 11/05/2022:  - KUB: "Punctate left renal calculus. No definite calcification over the course of the right kidney or ureter." - RUS: Left kidney stone again seen with no hydronephrosis.  Today: He was unable to get KUB done today.  Today he reports gross hematuria and bladder pain.   He reports that he passed two stones in the past month - he reports that he passes 1 stone per month on average.   He denies flank pain or fevers.  He denies acutely increased urinary urgency, frequency, dysuria, hesitancy, straining to void, or sensations of incomplete emptying.  He reports that he is trying to prioritize adequate daily water intake for stone prevention and has been taking the potassium citrate & Flomax as directed by Dr. Annabell Howells.    Fall Screening: Do you usually have a device to assist in your mobility? No   Medications: Current Outpatient Medications  Medication Sig Dispense Refill   ibuprofen (ADVIL) 200 MG tablet Take 400 mg by  mouth every 6 (six) hours as needed for moderate pain.     ketorolac (TORADOL) 10 MG tablet Take 1 tablet (10 mg total) by mouth every 6 (six) hours as needed. 20 tablet 0   metoprolol succinate (TOPROL-XL) 25 MG 24 hr tablet Take 25 mg by mouth every morning.     ondansetron (ZOFRAN) 4 MG tablet Take 1 tablet (4 mg total) by mouth every 6 (six) hours. As needed for nausea/vomiting 12 tablet 0   oxyCODONE-acetaminophen (PERCOCET) 7.5-325 MG tablet Take 1 tablet by mouth every 6 (six) hours as needed for severe pain. 15 tablet 0   potassium citrate (UROCIT-K) 10 MEQ (1080 MG) SR tablet Take 1 tablet (10 mEq total) by mouth 3 (three) times daily with meals. 270 tablet 3   promethazine (PHENERGAN) 25 MG tablet Take 1 tablet (25 mg total) by mouth every 6 (six) hours as needed for nausea or vomiting. 30 tablet 2   tamsulosin (FLOMAX) 0.4 MG CAPS capsule TAKE 1 CAPSULE BY MOUTH EVERY DAY 90 capsule 3   No current facility-administered medications for this visit.    Allergies: No Known Allergies  Past Medical History:  Diagnosis Date   Anxiety    Chronic headaches    Chronic hip pain    Hematuria    History of avascular necrosis of capital femoral epiphysis    bilateral hip s/p  replacement's   Hypertension  Mixed dyslipidemia    Nephrolithiasis    bilateral  non-obstuctive per ct 12-25-2015   Other hyperparathyroidism (HCC)    Right ureteral stone    Vitamin D deficiency    Weak urinary stream    Past Surgical History:  Procedure Laterality Date   CYSTOSCOPY WITH URETEROSCOPY, STONE BASKETRY AND STENT PLACEMENT Right 01/02/2016   Procedure: CYSTOSCOPY WITH RIGHT URETEROSCOPY, STONE EXTRACTION  AND STENT PLACEMENT;  Surgeon: Bjorn Pippin, MD;  Location: [facility];  Service: Urology;  Laterality: Right;   EXTRACORPOREAL SHOCK WAVE LITHOTRIPSY  yrs ago   HIP SURGERY Bilateral 2015   HOLMIUM LASER APPLICATION Right 01/02/2016   Procedure: HOLMIUM LASER APPLICATION;   Surgeon: Bjorn Pippin, MD;  Location: [facility];  Service: Urology;  Laterality: Right;   TOTAL HIP ARTHROPLASTY Bilateral 2015   URETEROLITHOTOMY  x2  last one 2007   Family History  Problem Relation Age of Onset   Hypertension Father    Social History   Socioeconomic History   Marital status: Married    Spouse name: Not on file   Number of children: Not on file   Years of education: Not on file   Highest education level: Not on file  Occupational History   Not on file  Tobacco Use   Smoking status: Every Day    Current packs/day: 0.50    Average packs/day: 0.5 packs/day for 15.0 years (7.5 ttl pk-yrs)    Types: Cigarettes   Smokeless tobacco: Current    Types: Snuff  Vaping Use   Vaping status: Never Used  Substance and Sexual Activity   Alcohol use: Not Currently    Comment: occassionally   Drug use: No   Sexual activity: Not on file  Other Topics Concern   Not on file  Social History Narrative   Not on file   Social Determinants of Health   Financial Resource Strain: Not on file  Food Insecurity: Not on file  Transportation Needs: Not on file  Physical Activity: Not on file  Stress: Not on file  Social Connections: Not on file  Intimate Partner Violence: Not on file    SUBJECTIVE  Review of Systems Constitutional: Patient denies any unintentional weight loss or change in strength lntegumentary: Patient denies any rashes or pruritus Cardiovascular: Patient denies chest pain or syncope Respiratory: Patient denies shortness of breath Gastrointestinal: Patient denies nausea, vomiting, constipation, or diarrhea Musculoskeletal: Patient denies muscle cramps or weakness Neurologic: Patient denies convulsions or seizures Psychiatric: Patient denies memory problems Allergic/Immunologic: Patient denies recent allergic reaction(s) Hematologic/Lymphatic: Patient denies bleeding tendencies Endocrine: Patient denies heat/cold intolerance  GU: As per  HPI.  OBJECTIVE Vitals:   12/22/22 1538  BP: 133/81  Pulse: 89  Temp: 98.4 F (36.9 C)   There is no height or weight on file to calculate BMI.  Physical Examination Constitutional: No obvious distress; patient is non-toxic appearing  Cardiovascular: No visible lower extremity edema.  Respiratory: The patient does not have audible wheezing/stridor; respirations do not appear labored  Gastrointestinal: Abdomen non-distended Musculoskeletal: Normal ROM of UEs  Skin: No obvious rashes/open sores  Neurologic: CN 2-12 grossly intact Psychiatric: Answered questions appropriately with normal affect  Hematologic/Lymphatic/Immunologic: No obvious bruises or sites of spontaneous bleeding  UA: 0-5 WBC/hpf, >30 RBC/hpf, bacteria (none)  ASSESSMENT Calculus of kidney - Plan: Urinalysis, Routine w reflex microscopic, promethazine (PHENERGAN) 25 MG tablet, oxyCODONE-acetaminophen (PERCOCET) 7.5-325 MG tablet, Ambulatory referral to Nephrology, DG Abd 1 View  Bladder pain - Plan:  oxyCODONE-acetaminophen (PERCOCET) 7.5-325 MG tablet, DG Abd 1 View  Hematuria, unspecified type - Plan: Urinalysis, Routine w reflex microscopic, DG Abd 1 View  Nausea - Plan: promethazine (PHENERGAN) 25 MG tablet  AVN (avascular necrosis of bone) (HCC) - Plan: Ambulatory referral to Nephrology  Other hyperparathyroidism [facility]) - Plan: Ambulatory referral to Nephrology  History of gout - Plan: Ambulatory referral to Nephrology  Metabolic syndrome - Plan: Ambulatory referral to Nephrology  We discussed evidence of interval passage of right distal ureteral stone per imaging on 11/05/2022 compared to KUB in July 2024.   He is currently symptomatic for stone passage again. Advised KUB today to re-evaluate stones; will notify patient of recommendation based on findings.  Advised patient to establish care with Nephrology for further evaluation due to the highly recurrent nature of his stone disease in setting of  hyperparathyroidism, avascular necrosis, metabolic syndrome, and gout. He accepted that referral.   Advised to maintain adequate daily hydration, continue potassium citrate, and consider low oxalate diet for stone prevention at this time; handouts provided. Will continue Flomax daily.  Will plan to follow up in 6 months with KUB for stone surveillance or sooner if needed. Pt verbalized understanding and agreement. All questions were answered.  PLAN Advised the following: KUB today. OTC analgesics PRN for pain; refill sent for Percocet PRN for severe pain. Refill sent for Phenergan PRN for nausea. Continue Flomax daily. Continue potassium citrate daily.  Maintain adequate fluid intake daily. Low oxalate diet. Referral placed to Nephrology for complex recurrent stone disease. Return in about 3 months (around 03/24/2023) for f/u with Dr. Annabell Howells with KUB prior.  Orders Placed This Encounter  Procedures   DG Abd 1 View    Standing Status:   Future    Standing Expiration Date:   12/22/2023    Order Specific Question:   Reason for Exam (SYMPTOM  OR DIAGNOSIS REQUIRED)    Answer:   kidney stone    Order Specific Question:   Preferred imaging location?    Answer:   [facility]   Urinalysis, Routine w reflex microscopic   Ambulatory referral to Nephrology    Referral Priority:   Routine    Referral Type:   Consultation    Referral Reason:   Specialty Services Required    Requested Specialty:   Nephrology    Number of Visits Requested:   1    It has been explained that the patient is to follow regularly with their PCP in addition to all other providers involved in their care and to follow instructions provided by these respective offices. Patient advised to contact urology clinic if any urologic-pertaining questions, concerns, new symptoms or problems arise in the interim period.  Patient Instructions  >80% of stones are calcium oxalate. This type of stones forms when body either  isn't clearing oxalate well enough, is making too much oxalate, or too little citrate. This results in oxalate binding to form crystals, which continue to aggregate and form stones.  Limiting calcium does not help, but limiting oxalate in the diet can help. Increasing citric acid intake may also help.  The following measures may help to prevent the recurrence of stones: Increase water intake to 2-2.5 liters per day May add citrus juice (lemon, lime or orange juice) to water Moderation in dairy foods Decrease in salt content 5. Low Oxalate diet: Oxylates are found in foods like Tomato, Spinach, red wine and chocolate (see additional resources below).  Internet resources for information regarding  low oxalate diet: https://kidneystones.yangchunwu.com https://my.VerticalStretch.be  Foods Low in Sodium or Oxalate Foods You Can Eat  Drinks Coffee, fruit and veggie juice (using the recommended veggies), fruit punch  Fruits Apples, apricots (fresh or canned), avocado, bananas, cherries (sweet), cranberries, grapefruit, red or green grapes, lemon and lime juice, melons, nectarines, papayas, peaches, pears, pineapples, oranges, strawberries (fresh), tangerines  Veggies Artichokes, asparagus, bamboo shoots, broccoli, brussels sprouts, cabbage, cauliflower, chayote squash, chicory, corn, cucumbers, endive, lettuce, lima beans, mushrooms, onions, peas, peppers, potatoes, radishes, rutabagas, zucchini  Breads, Cereals, Grains Egg noodles, rye bread, cooked and dry cereals without nuts or bran, crackers with unsalted tops, white or wild rice  Meat, Meat Replacements, Fish, Recruitment consultant, fish, poultry, eggs, egg whites, egg replacements  Soup Homemade soup (using the recommended veggies and meat), low-sodium bouillon, low-sodium canned  Desserts Cookies, cakes, ice cream, pudding without chocolate or nuts, candy  without chocolate or nuts  Fats and Oils Butter, margarine, cream, oil, salad dressing, mayo  Other Foods Unsalted potato chips or pretzels, herbs (like garlic, garlic powder, onion powder), lemon juice, salt-free seasoning blends, vinegar  Other Foods Low in Oxalate Foods You Can Eat  Drinks Beer, cola, wine, buttermilk, lemonade or limeade (without added vitamin C), milk  Meat, Meat Replacements, Fish, [employer] meat, ham, bacon, hot dogs, bratwurst, sausage, chicken nuggets, cheddar cheese, canned fish and shellfish  Soup Tomato soup, cheese soup  Other Foods Coconuts, lemon or lime juices, sugar or sweeteners, jellies or jams (from the recommended list)   Moderate-Oxalate Foods Foods to Limit   Drinks Fruit and veggie juices (from the list below), chocolate milk, rice milk, hot cocoa, tea   Fruits Blackberries, blueberries, black currants, cherries (sour), fruit cocktail, mangoes, orange peel, prunes, purple plums   Veggies Baked beans, carrots, celery, green beans, parsnips, summer squash, tomatoes, turnips   Breads, Cereals, Grains White bread, cornbread or cornmeal, white English muffins, saltine or soda crackers, brown rice, vanilla wafers, spaghetti and other noodles, firm tofu, bagels, oatmeal   Meat/meat replacements, fish, poultry Sardines   Desserts Chocolate cake   Fats and Oils Macadamia nuts, pistachio nuts, English walnuts   Other Foods Jams or jellies (made with the fruits above), pepper    High-Oxalate Foods Foods to Avoid Drinks Chocolate drink mixes, soy milk, Ovaltine, instant iced tea, fruit juices of fruits listed below Fruits Apricots (dried), red currants, figs, kiwi, plums, rhubarb Veggies Beans (wax, dried), beets and beet greens, chives, collard greens, eggplant, escarole, dark greens of all kinds, leeks, okra, parsley, rutabagas, spinach, Swiss chard, tomato paste, watercress Breads, Cereals, Grains Amaranth, barley, white corn flour, fried potatoes,  fruitcake, grits, soybean products, sweet potatoes, wheat germ and bran, buckwheat flour, All Bran cereal, graham crackers, pretzels, whole wheat bread Meat/meat replacements, fish, poultry  Dried beans, peanut butter, soy burgers, miso  Desserts Carob, chocolate, marmalades Fats and Oils Nuts (peanuts, almonds, pecans, cashews, hazelnuts), nut butters, sesame seeds, tahini paste Other Foods Poppy seeds   Electronically signed by:  Donnita Falls, MSN, FNP-C, CUNP 12/22/2022 4:14 PM

## 2022-12-23 LAB — URINALYSIS, ROUTINE W REFLEX MICROSCOPIC
Bilirubin, UA: NEGATIVE
Glucose, UA: NEGATIVE
Ketones, UA: NEGATIVE
Leukocytes,UA: NEGATIVE
Nitrite, UA: NEGATIVE
Protein,UA: NEGATIVE
Specific Gravity, UA: 1.025 (ref 1.005–1.030)
Urobilinogen, Ur: 0.2 mg/dL (ref 0.2–1.0)
pH, UA: 6 (ref 5.0–7.5)

## 2022-12-23 LAB — MICROSCOPIC EXAMINATION
Bacteria, UA: NONE SEEN
RBC, Urine: >30 /[HPF] — AB (ref 0–2)

## 2023-01-06 ENCOUNTER — Telehealth: Payer: Self-pay | Admitting: Urology

## 2023-01-06 NOTE — Telephone Encounter (Signed)
Patient wife called her husband needs a refill on oxyCODONE-acetaminophen (PERCOCET) 7.5-325 MG   Pharmacy - [facility] on [address]

## 2023-01-06 NOTE — Telephone Encounter (Signed)
Please see pt request below.

## 2023-01-07 NOTE — Telephone Encounter (Signed)
Patient informed via my chart.

## 2023-01-25 ENCOUNTER — Emergency Department (HOSPITAL_COMMUNITY)
Admission: EM | Admit: 2023-01-25 | Discharge: 2023-01-25 | Disposition: A | Discharge status: Home / Self Care | Payer: Managed Care, Other (non HMO) | Attending: Emergency Medicine | Admitting: Emergency Medicine

## 2023-01-25 ENCOUNTER — Encounter (HOSPITAL_COMMUNITY): Payer: Self-pay

## 2023-01-25 ENCOUNTER — Telehealth: Payer: Self-pay | Admitting: Urology

## 2023-01-25 ENCOUNTER — Other Ambulatory Visit: Payer: Self-pay

## 2023-01-25 ENCOUNTER — Emergency Department (HOSPITAL_COMMUNITY): Payer: Managed Care, Other (non HMO)

## 2023-01-25 DIAGNOSIS — R1032 Left lower quadrant pain: Secondary | ICD-10-CM | POA: Insufficient documentation

## 2023-01-25 DIAGNOSIS — Z79899 Other long term (current) drug therapy: Secondary | ICD-10-CM | POA: Insufficient documentation

## 2023-01-25 DIAGNOSIS — J189 Pneumonia, unspecified organism: Secondary | ICD-10-CM

## 2023-01-25 DIAGNOSIS — I10 Essential (primary) hypertension: Secondary | ICD-10-CM | POA: Diagnosis not present

## 2023-01-25 DIAGNOSIS — J181 Lobar pneumonia, unspecified organism: Secondary | ICD-10-CM | POA: Diagnosis not present

## 2023-01-25 DIAGNOSIS — R109 Unspecified abdominal pain: Secondary | ICD-10-CM

## 2023-01-25 LAB — CBC
HCT: 46.2 % (ref 39.0–52.0)
Hemoglobin: 16.1 g/dL (ref 13.0–17.0)
MCH: 33.1 pg (ref 26.0–34.0)
MCHC: 34.8 g/dL (ref 30.0–36.0)
MCV: 94.9 fL (ref 80.0–100.0)
Platelets: 355 10*3/uL (ref 150–400)
RBC: 4.87 MIL/uL (ref 4.22–5.81)
RDW: 12.4 % (ref 11.5–15.5)
WBC: 9.4 10*3/uL (ref 4.0–10.5)
nRBC: 0 % (ref 0.0–0.2)

## 2023-01-25 LAB — COMPREHENSIVE METABOLIC PANEL
ALT: 30 U/L (ref 0–44)
AST: 30 U/L (ref 15–41)
Albumin: 3.8 g/dL (ref 3.5–5.0)
Alkaline Phosphatase: 70 U/L (ref 38–126)
Anion gap: 12 (ref 5–15)
BUN: 8 mg/dL (ref 6–20)
CO2: 23 mmol/L (ref 22–32)
Calcium: 9 mg/dL (ref 8.9–10.3)
Chloride: 104 mmol/L (ref 98–111)
Creatinine, Ser: 0.95 mg/dL (ref 0.61–1.24)
GFR, Estimated: >60 mL/min (ref >60)
Glucose, Bld: 118 mg/dL — ABNORMAL HIGH (ref 70–99)
Potassium: 3.6 mmol/L (ref 3.5–5.1)
Sodium: 139 mmol/L (ref 135–145)
Total Bilirubin: 0.5 mg/dL (ref <1.2)
Total Protein: 7 g/dL (ref 6.5–8.1)

## 2023-01-25 LAB — URINALYSIS, ROUTINE W REFLEX MICROSCOPIC
Bacteria, UA: NONE SEEN
Bilirubin Urine: NEGATIVE
Glucose, UA: NEGATIVE mg/dL
Ketones, ur: NEGATIVE mg/dL
Leukocytes,Ua: NEGATIVE
Nitrite: NEGATIVE
Protein, ur: 30 mg/dL — AB
RBC / HPF: >50 RBC/hpf (ref 0–5)
Specific Gravity, Urine: 1.017 (ref 1.005–1.030)
pH: 6 (ref 5.0–8.0)

## 2023-01-25 LAB — LIPASE, BLOOD: Lipase: 37 U/L (ref 11–51)

## 2023-01-25 MED ORDER — DOXYCYCLINE HYCLATE 100 MG PO CAPS: 100.0000 mg | ORAL_CAPSULE | Freq: Two times a day (BID) | ORAL | 0 refills | Status: DC

## 2023-01-25 MED ORDER — ONDANSETRON 4 MG PO TBDP: 4.0000 mg | ORAL_TABLET | Freq: Three times a day (TID) | ORAL | 0 refills | Status: DC | PRN

## 2023-01-25 MED ORDER — DOXYCYCLINE HYCLATE 100 MG PO TABS
100.0000 mg | ORAL_TABLET | Freq: Once | ORAL | Status: AC
  Administered 2023-01-25: 100 mg via ORAL
  Filled 2023-01-25: qty 1

## 2023-01-25 MED ORDER — KETOROLAC TROMETHAMINE 10 MG PO TABS: 10.0000 mg | ORAL_TABLET | Freq: Four times a day (QID) | ORAL | 0 refills | Status: DC | PRN

## 2023-01-25 MED ORDER — ONDANSETRON HCL 4 MG/2ML IJ SOLN
4.0000 mg | Freq: Once | INTRAMUSCULAR | Status: AC
  Administered 2023-01-25: 4 mg via INTRAVENOUS
  Filled 2023-01-25: qty 2

## 2023-01-25 MED ORDER — HYDROMORPHONE HCL 1 MG/ML IJ SOLN
1.0000 mg | Freq: Once | INTRAMUSCULAR | Status: AC
  Administered 2023-01-25: 1 mg via INTRAVENOUS
  Filled 2023-01-25: qty 1

## 2023-01-25 MED ORDER — KETOROLAC TROMETHAMINE 30 MG/ML IJ SOLN
15.0000 mg | Freq: Once | INTRAMUSCULAR | Status: AC
  Administered 2023-01-25: 15 mg via INTRAVENOUS
  Filled 2023-01-25: qty 1

## 2023-01-25 MED ORDER — SODIUM CHLORIDE 0.9 % IV BOLUS
1000.0000 mL | Freq: Once | INTRAVENOUS | Status: AC
  Administered 2023-01-25: 1000 mL via INTRAVENOUS

## 2023-01-25 NOTE — Telephone Encounter (Signed)
Pharmacy  - [facility] [place]   Patient wife called requesting a refill on oxyCODONE-acetaminophen (PERCOCET) 7.5-325 MG tablet [[id number]]

## 2023-01-25 NOTE — ED Triage Notes (Signed)
Pt c/o groin pain due to a kidney stone. Pt with hx of kidney stones. Started this morning. Endorses n/v. Pain 8/10

## 2023-01-25 NOTE — Discharge Instructions (Addendum)
You have been seen today for your complaint of left flank pain. Your lab work was reassuring but does show blood in the urine. Your imaging shows potential pneumonia.  Will treat this with antibiotics. Your discharge medications include doxycycline. This is an antibiotic. You should take it as prescribed. You should take it for the entire duration of the prescription. This may cause an upset stomach. This is normal. You may take this with food. You may also eat yogurt to prevent diarrhea.  Toradol.  Take this as needed for pain. Zofran.  Take this as needed for nausea.. Follow up with: Your nephrologist and urologist as scheduled Please seek immediate medical care if you develop any of the following symptoms: You have trouble breathing or you are short of breath. Your abdomen hurts or it is swollen or red. You have nausea or vomiting. You feel faint, or you faint. You have blood in your urine. You have flank pain and a fever. At this time there does not appear to be the presence of an emergent medical condition, however there is always the potential for conditions to change. Please read and follow the below instructions.  Do not take your medicine if  develop an itchy rash, swelling in your mouth or lips, or difficulty breathing; call 911 and seek immediate emergency medical attention if this occurs.  You may review your lab tests and imaging results in their entirety on your MyChart account.  Please discuss all results of fully with your primary care provider and other specialist at your follow-up visit.  Note: Portions of this text may have been transcribed using voice recognition software. Every effort was made to ensure accuracy; however, inadvertent computerized transcription errors may still be present.

## 2023-01-25 NOTE — ED Notes (Signed)
Patient transported to CT 

## 2023-01-25 NOTE — ED Provider Notes (Signed)
EMERGENCY DEPARTMENT AT [facility] Provider Note   CSN: [id number] Arrival date & time: 01/25/23  1859     History  Chief Complaint  Patient presents with   Flank Pain    Jorge Nichols is a 32 y.o. male.  With history of hypertension, anxiety, recurrent ureteral stones presenting to the ED for evaluation of ureteral colic.  He developed sharp left flank pain yesterday evening.  He states this is progressed to left groin pain this morning.  Feels similar to previous kidney stones.  Does have a history of lithotripsy and ureteral stents.  He most recently saw urology approximately 1 month ago.  He denies any fevers.  He does report nausea and vomiting.  He denies dysuria but does report hematuria.  He otherwise denies abdominal pain.  Currently rates his pain at a 7 out of 10.   Flank Pain       Home Medications Prior to Admission medications   Medication Sig Start Date End Date Taking? Authorizing Provider  doxycycline (VIBRAMYCIN) 100 MG capsule Take 1 capsule (100 mg total) by mouth 2 (two) times daily. 01/25/23  Yes Jacqulene Huntley, Edsel Petrin, PA-C  ketorolac (TORADOL) 10 MG tablet Take 1 tablet (10 mg total) by mouth every 6 (six) hours as needed. 01/25/23  Yes Emalynn Clewis, Edsel Petrin, PA-C  ondansetron (ZOFRAN-ODT) 4 MG disintegrating tablet Take 1 tablet (4 mg total) by mouth every 8 (eight) hours as needed for nausea or vomiting. 01/25/23  Yes Jaedah Lords, Edsel Petrin, PA-C  ibuprofen (ADVIL) 200 MG tablet Take 400 mg by mouth every 6 (six) hours as needed for moderate pain.    [provider]  metoprolol succinate (TOPROL-XL) 25 MG 24 hr tablet Take 25 mg by mouth every morning. 05/15/22   [provider]  ondansetron (ZOFRAN) 4 MG tablet Take 1 tablet (4 mg total) by mouth every 6 (six) hours. As needed for nausea/vomiting 11/02/22   Bjorn Pippin, MD  oxyCODONE-acetaminophen (PERCOCET) 7.5-325 MG tablet Take 1 tablet by mouth every 6 (six) hours as needed  for severe pain. 12/22/22   Donnita Falls, FNP  potassium citrate (UROCIT-K) 10 MEQ (1080 MG) SR tablet Take 1 tablet (10 mEq total) by mouth 3 (three) times daily with meals. 10/22/22   Bjorn Pippin, MD  promethazine (PHENERGAN) 25 MG tablet Take 1 tablet (25 mg total) by mouth every 6 (six) hours as needed for nausea or vomiting. 12/22/22   Donnita Falls, FNP  tamsulosin (FLOMAX) 0.4 MG CAPS capsule TAKE 1 CAPSULE BY MOUTH EVERY DAY 11/24/22   Bjorn Pippin, MD      Allergies    Patient has no known allergies.    Review of Systems   Review of Systems  Genitourinary:  Positive for flank pain and testicular pain.  All other systems reviewed and are negative.   Physical Exam Updated Vital Signs BP (!) 172/103   Pulse 91   Temp 98.7 F (37.1 C) (Oral)   Resp 20   SpO2 99%  Physical Exam Vitals and nursing note reviewed. Exam conducted with a chaperone present Ephriam Knuckles, NT).  Constitutional:      General: He is not in acute distress.    Appearance: He is well-developed.     Comments: Resting comfortably in bed, mild distress  HENT:     Head: Normocephalic and atraumatic.  Eyes:     Conjunctiva/sclera: Conjunctivae normal.  Cardiovascular:     Rate and Rhythm: Normal rate and regular  rhythm.     Heart sounds: No murmur heard. Pulmonary:     Effort: Pulmonary effort is normal. No respiratory distress.     Breath sounds: Normal breath sounds. No wheezing, rhonchi or rales.  Abdominal:     Palpations: Abdomen is soft.     Tenderness: There is no abdominal tenderness. There is no guarding.  Genitourinary:    Comments: Uncircumcised penis.  Testicles equal bilaterally.  No Bell Clapper deformity.  No inguinal hernias or lymphadenopathy.  No drainage at urethral meatus.  No scrotal erythema.  Negative Prehn sign.  Cremasteric reflexes intact bilaterally. Musculoskeletal:        General: No swelling.     Cervical back: Neck supple.  Skin:    General: Skin is warm and dry.      Capillary Refill: Capillary refill takes less than 2 seconds.  Neurological:     Mental Status: He is alert.  Psychiatric:        Mood and Affect: Mood normal.     ED Results / Procedures / Treatments   Labs (all labs ordered are listed, but only abnormal results are displayed) Labs Reviewed  COMPREHENSIVE METABOLIC PANEL - Abnormal; Notable for the following components:      Result Value   Glucose, Bld 118 (*)    All other components within normal limits  URINALYSIS, ROUTINE W REFLEX MICROSCOPIC - Abnormal; Notable for the following components:   Hgb urine dipstick LARGE (*)    Protein, ur 30 (*)    All other components within normal limits  LIPASE, BLOOD  CBC    EKG None  Radiology CT Renal Stone Study  Result Date: 01/25/2023 CLINICAL DATA:  Abdominal/flank pain, stone suspected Groin pain, history of kidney stones. EXAM: CT ABDOMEN AND PELVIS WITHOUT CONTRAST TECHNIQUE: Multidetector CT imaging of the abdomen and pelvis was performed following the standard protocol without IV contrast. RADIATION DOSE REDUCTION: This exam was performed according to the departmental dose-optimization program which includes automated exposure control, adjustment of the mA and/or kV according to patient size and/or use of iterative reconstruction technique. COMPARISON:  Most recent CT 10/07/2022 FINDINGS: Lower chest: Patchy airspace disease in the medial left lower lobe series 4 images 16 through 20. Hepatobiliary: Mild hepatic steatosis. No evidence of focal liver abnormality in the absence of IV contrast. Gallbladder physiologically distended, no calcified stone. No biliary dilatation. Pancreas: No ductal dilatation or inflammation. Spleen: Normal in size without focal abnormality. Splenule anteriorly. Adrenals/Urinary Tract: No adrenal nodule. There are punctate bilateral intrarenal calculi. No hydronephrosis. No ureteral stone. Nondistended urinary bladder, no bladder stone. Stomach/Bowel: Ingested  material in the stomach. No bowel wall thickening, inflammation or obstruction. Normal appendix. Small volume of stool in the colon. Vascular/Lymphatic: Normal caliber abdominal aorta. No bulky abdominopelvic adenopathy. Reproductive: Prostate is unremarkable. Other: No free air or ascites. Tiny fat containing umbilical hernia. No inguinal hernia. Musculoskeletal: Bilateral hip arthroplasties. Scattered Schmorl's nodes in the thoracolumbar spine with slight scoliosis. There are no acute or suspicious osseous abnormalities. IMPRESSION: 1. Punctate bilateral intrarenal calculi. No ureteral stone or hydronephrosis. 2. Patchy airspace disease in the medial left lower lobe, suspicious for pneumonia. 3. Mild hepatic steatosis. Electronically Signed   By: Narda Rutherford M.D.   On: 01/25/2023 20:43    Procedures Procedures    Medications Ordered in ED Medications  doxycycline (VIBRA-TABS) tablet 100 mg (has no administration in time range)  sodium chloride 0.9 % bolus 1,000 mL (1,000 mLs Intravenous New Bag/Given 01/25/23 1947)  HYDROmorphone (DILAUDID) injection 1 mg (1 mg Intravenous Given 01/25/23 1948)  ketorolac (TORADOL) 30 MG/ML injection 15 mg (15 mg Intravenous Given 01/25/23 1948)  ondansetron (ZOFRAN) injection 4 mg (4 mg Intravenous Given 01/25/23 1948)    ED Course/ Medical Decision Making/ A&P                                 Medical Decision Making Amount and/or Complexity of Data Reviewed Labs: ordered. Radiology: ordered.  Risk Prescription drug management.  This patient presents to the ED for concern of left flank pain, this involves an extensive number of treatment options, and is a complaint that carries with it a high risk of complications and morbidity.  The differential diagnosis of emergent flank pain includes, but is not limited to :Abdominal aortic aneurysm,, Renal artery embolism,Renal vein thrombosis, Aortic dissection, Mesenteric ischemia, Pyelonephritis, Renal  infarction, Renal hemorrhage, Nephrolithiasis/ Renal Colic, Bladder tumor,Cystitis, Biliary colic, Pancreatitis Perforated peptic ulcer Appendicitis ,Inguinal Hernia, Diverticulitis, Bowel obstruction Testicular torsion,Epididymitis Shingles Lower lobe pneumonia, Retroperitoneal hematoma/abscess/tumor, Epidural abscess, Epidural hematoma   My initial workup includes labs, imaging, symptom control  Additional history obtained from: Nursing notes from this visit. 5 ED visits for similar in the past 6 months  I ordered, reviewed and interpreted labs which include: CBC, CMP, lipase, urinalysis  I ordered imaging studies including CT stone study I independently visualized and interpreted imaging which showed questionable left lower lobe pneumonia.  Otherwise normal I agree with the radiologist interpretation  Afebrile, hemodynamically stable.  32 year old male presenting to the ED for evaluation of left flank and groin pain.  Feels similar to previous episodes of ureteral colic.  He reports hematuria but denies other urinary symptoms.  He has tried his home medications without improvement.  He reports some nausea and vomiting as well.  He initially.  Slightly uncomfortable but physical exam is otherwise reassuring.  CT stone study did not reveal any kidney stones but was concerning for questionable left lower lobe pneumonia.  Patient reports he has had a slight cough over the past 2 days.  No shortness of breath.  He is nontachypneic, nontachycardic, nonhypoxic and nontoxic-appearing.  Will treat for CAP with doxycycline.  Lab workup is reassuring.  Urine with large hemoglobin and greater than 50 red blood cells.  Patient believes he may have recently passed a stone.  Pain does radiate into the left testicle.  Describes as an aching pain he is sexually active with his wife and no one else.  He denies any other urinary symptoms.  He declines STD testing.  GU exam is reassuring.  No Prehn sign or Bell  Clapper deformity.  Cremasteric reflexes intact.  Low suspicion for testicular torsion, however scrotal ultrasound was offered to rule this out.  He declines.  Will send prescriptions for Toradol and Zofran for pain and nausea.  He was encouraged to follow-up with urology and nephrology as scheduled.  He was given return precautions.  Stable at discharge.  At this time there does not appear to be any evidence of an acute emergency medical condition and the patient appears stable for discharge with appropriate outpatient follow up. Diagnosis was discussed with patient who verbalizes understanding of care plan and is agreeable to discharge. I have discussed return precautions with patient who verbalizes understanding. Patient encouraged to follow-up with their PCP within 1 week. All questions answered.  Note: Portions of this report may have been transcribed  using voice recognition software. Every effort was made to ensure accuracy; however, inadvertent computerized transcription errors may still be present.        Final Clinical Impression(s) / ED Diagnoses Final diagnoses:  Left flank pain  Community acquired pneumonia of left lower lobe of lung    Rx / DC Orders ED Discharge Orders          Ordered    doxycycline (VIBRAMYCIN) 100 MG capsule  2 times daily        01/25/23 2108    ketorolac (TORADOL) 10 MG tablet  Every 6 hours PRN        01/25/23 2108    ondansetron (ZOFRAN-ODT) 4 MG disintegrating tablet  Every 8 hours PRN        01/25/23 2108              Mora Bellman 01/25/23 2109    Bethann Berkshire, MD 01/26/23 1026

## 2023-01-25 NOTE — ED Notes (Signed)
ED Provider at bedside. 

## 2023-01-26 NOTE — Telephone Encounter (Signed)
Will you refill rx at this time?

## 2023-01-27 NOTE — Telephone Encounter (Signed)
Patient wife called about getting pain medication refilled,  she said the stones wont show up on the tests, but she knows they are there? I told he he needs to have xray done, she hung up.

## 2023-01-27 NOTE — Telephone Encounter (Signed)
Mychart message sent informing patient

## 2023-02-01 ENCOUNTER — Telehealth: Payer: Self-pay | Admitting: Urology

## 2023-02-01 NOTE — Telephone Encounter (Signed)
Patient wife called she wants a referral sent over to [facility] on [address] for her husband , and they are going to stop today and fill out the medical release form to have all records transferred to them.   [facility]  [phone] fax number

## 2023-02-02 NOTE — Telephone Encounter (Signed)
Please let me know once they have signed release and we will fax over clinical notes.

## 2023-03-25 ENCOUNTER — Ambulatory Visit: Payer: Managed Care, Other (non HMO) | Admitting: Urology

## 2023-04-06 ENCOUNTER — Emergency Department
Admission: EM | Admit: 2023-04-06 | Discharge: 2023-04-06 | Disposition: A | Discharge status: Home / Self Care | Payer: Managed Care, Other (non HMO) | Attending: Emergency Medicine | Admitting: Emergency Medicine

## 2023-04-06 ENCOUNTER — Emergency Department: Payer: Managed Care, Other (non HMO)

## 2023-04-06 ENCOUNTER — Other Ambulatory Visit: Payer: Self-pay

## 2023-04-06 DIAGNOSIS — I10 Essential (primary) hypertension: Secondary | ICD-10-CM | POA: Diagnosis not present

## 2023-04-06 DIAGNOSIS — R109 Unspecified abdominal pain: Secondary | ICD-10-CM

## 2023-04-06 DIAGNOSIS — N200 Calculus of kidney: Secondary | ICD-10-CM | POA: Insufficient documentation

## 2023-04-06 LAB — URINALYSIS, ROUTINE W REFLEX MICROSCOPIC
Bacteria, UA: NONE SEEN
Bilirubin Urine: NEGATIVE
Glucose, UA: NEGATIVE mg/dL
Hgb urine dipstick: NEGATIVE
Ketones, ur: NEGATIVE mg/dL
Leukocytes,Ua: NEGATIVE
Nitrite: NEGATIVE
Protein, ur: 30 mg/dL — AB
Specific Gravity, Urine: 1.024 (ref 1.005–1.030)
pH: 7 (ref 5.0–8.0)

## 2023-04-06 LAB — CBC WITH DIFFERENTIAL/PLATELET
Abs Immature Granulocytes: 0.03 10*3/uL (ref 0.00–0.07)
Basophils Absolute: 0.1 10*3/uL (ref 0.0–0.1)
Basophils Relative: 1 %
Eosinophils Absolute: 0.2 10*3/uL (ref 0.0–0.5)
Eosinophils Relative: 2 %
HCT: 49 % (ref 39.0–52.0)
Hemoglobin: 16.3 g/dL (ref 13.0–17.0)
Immature Granulocytes: 0 %
Lymphocytes Relative: 26 %
Lymphs Abs: 2.4 10*3/uL (ref 0.7–4.0)
MCH: 33.4 pg (ref 26.0–34.0)
MCHC: 33.3 g/dL (ref 30.0–36.0)
MCV: 100.4 fL — ABNORMAL HIGH (ref 80.0–100.0)
Monocytes Absolute: 0.9 10*3/uL (ref 0.1–1.0)
Monocytes Relative: 9 %
Neutro Abs: 5.9 10*3/uL (ref 1.7–7.7)
Neutrophils Relative %: 62 %
Platelets: 295 10*3/uL (ref 150–400)
RBC: 4.88 MIL/uL (ref 4.22–5.81)
RDW: 11.9 % (ref 11.5–15.5)
WBC: 9.5 10*3/uL (ref 4.0–10.5)
nRBC: 0 % (ref 0.0–0.2)

## 2023-04-06 LAB — BASIC METABOLIC PANEL
Anion gap: 15 (ref 5–15)
BUN: 12 mg/dL (ref 6–20)
CO2: 23 mmol/L (ref 22–32)
Calcium: 9.5 mg/dL (ref 8.9–10.3)
Chloride: 104 mmol/L (ref 98–111)
Creatinine, Ser: 0.82 mg/dL (ref 0.61–1.24)
GFR, Estimated: >60 mL/min (ref >60)
Glucose, Bld: 102 mg/dL — ABNORMAL HIGH (ref 70–99)
Potassium: 3.4 mmol/L — ABNORMAL LOW (ref 3.5–5.1)
Sodium: 142 mmol/L (ref 135–145)

## 2023-04-06 MED ORDER — HYDROCODONE-ACETAMINOPHEN 5-325 MG PO TABS
1.0000 | ORAL_TABLET | Freq: Once | ORAL | Status: AC
  Administered 2023-04-06: 1 via ORAL
  Filled 2023-04-06: qty 1

## 2023-04-06 NOTE — ED Provider Notes (Signed)
 [facility] Provider Note    Event Date/Time   First MD Initiated Contact with Patient 04/06/23 2107     (approximate)   History   Flank Pain   HPI Jorge Nichols is a 33 y.o. male with history of HTN, HLD, kidney stones presenting today for flank pain.  Patient states onset of left flank pain 2 days ago.  States pain comes and goes.  Has had some pain with urination.  Otherwise denies nausea, vomiting, fever, frontal abdominal pain, diarrhea, constipation.     Physical Exam   Triage Vital Signs: ED Triage Vitals  Encounter Vitals Group     BP 04/06/23 1835 (!) 151/103     Systolic BP Percentile --      Diastolic BP Percentile --      Pulse Rate 04/06/23 1835 98     Resp 04/06/23 1835 20     Temp 04/06/23 1835 98.2 F (36.8 C)     Temp Source 04/06/23 1835 Oral     SpO2 04/06/23 1835 100 %     Weight --      Height --      Head Circumference --      Peak Flow --      Pain Score 04/06/23 1834 8     Pain Loc --      Pain Education --      Exclude from Growth Chart --     Most recent vital signs: Vitals:   04/06/23 1835  BP: (!) 151/103  Pulse: 98  Resp: 20  Temp: 98.2 F (36.8 C)  SpO2: 100%   I have reviewed the vital signs. General:  Awake, alert, no acute distress. Head:  Normocephalic, Atraumatic. EENT:  PERRL, EOMI, Oral mucosa pink and moist, Neck is supple. Cardiovascular: Regular rate, 2+ distal pulses. Respiratory:  Normal respiratory effort, symmetrical expansion, no distress.   Abdomen, soft, nontender, nondistended.  No CVA tenderness bilaterally Extremities:  Moving all four extremities through full ROM without pain.   Neuro:  Alert and oriented.  Interacting appropriately.   Skin:  Warm, dry, no rash.   Psych: Appropriate affect.    ED Results / Procedures / Treatments   Labs (all labs ordered are listed, but only abnormal results are displayed) Labs Reviewed  BASIC METABOLIC PANEL - Abnormal; Notable for the  following components:      Result Value   Potassium 3.4 (*)    Glucose, Bld 102 (*)    All other components within normal limits  CBC WITH DIFFERENTIAL/PLATELET - Abnormal; Notable for the following components:   MCV 100.4 (*)    All other components within normal limits  URINALYSIS, ROUTINE W REFLEX MICROSCOPIC - Abnormal; Notable for the following components:   Color, Urine YELLOW (*)    APPearance CLEAR (*)    Protein, ur 30 (*)    All other components within normal limits     EKG    RADIOLOGY Independently interpreted CT abdomen/pelvis with no acute pathology.   PROCEDURES:  Critical Care performed: No  Procedures   MEDICATIONS ORDERED IN ED: Medications  HYDROcodone -acetaminophen  (NORCO/VICODIN) 5-325 MG per tablet 1 tablet (1 tablet Oral Given 04/06/23 1842)     IMPRESSION / MDM / ASSESSMENT AND PLAN / ED COURSE  I reviewed the triage vital signs and the nursing notes.  Differential diagnosis includes, but is not limited to, nephrolithiasis, urethral lithiasis, UTI  Patient's presentation is most consistent with acute complicated illness / injury requiring diagnostic workup.  Patient is a 33 year old male presenting today for left flank pain intermittent over the past 3 days with prior history of kidney stones.  Vital signs are stable.  In between his initial triage and my first visit, patient states that he did pass a stone.  He has had resolution of pain symptoms since then.  Physical exam otherwise unremarkable at this time.  CT abdomen/pelvis shows no hydronephrosis or stones within the ureters.  CBC, BMP unremarkable.  UA without infection.  Patient likely passed stone with resolution of symptoms.  Will discharge at this time with outpatient follow-up as needed.     FINAL CLINICAL IMPRESSION(S) / ED DIAGNOSES   Final diagnoses:  Left flank pain  Nephrolithiasis     Rx / DC Orders   ED Discharge Orders     None         Note:  This document was prepared using Dragon voice recognition software and may include unintentional dictation errors.   Malvina Alm DASEN, MD 04/06/23 2121

## 2023-04-06 NOTE — ED Provider Triage Note (Signed)
 Emergency Medicine Provider Triage Evaluation Note  Jorge Nichols , a 33 y.o. male  was evaluated in triage.  Pt complains of flank pain, hx kidney stones.  Review of Systems  Positive:  Negative:   Physical Exam  BP (!) 151/103   Pulse 98   Temp 98.2 F (36.8 C) (Oral)   Resp 20   SpO2 100%  Gen:   Awake, no distress   Resp:  Normal effort  MSK:   Moves extremities without difficulty  Other:    Medical Decision Making  Medically screening exam initiated at 6:38 PM.  Appropriate orders placed.  Jorge Nichols was informed that the remainder of the evaluation will be completed by another provider, this initial triage assessment does not replace that evaluation, and the importance of remaining in the ED until their evaluation is complete.     Gasper Devere ORN, PA-C 04/06/23 RONOLD

## 2023-04-06 NOTE — ED Triage Notes (Signed)
 Pt here for left flank pain radiating to groin. Blood in urine. S/S started a day or two ago. Hx of kidney stones.

## 2023-04-06 NOTE — Discharge Instructions (Addendum)
 You can take ibuprofen as needed over the next several days to reduce pain symptoms.  Otherwise follow-up with your primary care provider as needed.

## 2023-05-03 ENCOUNTER — Emergency Department (HOSPITAL_COMMUNITY)
Admission: EM | Admit: 2023-05-03 | Discharge: 2023-05-03 | Disposition: A | Discharge status: Home / Self Care | Payer: Managed Care, Other (non HMO) | Attending: Emergency Medicine | Admitting: Emergency Medicine

## 2023-05-03 ENCOUNTER — Emergency Department (HOSPITAL_COMMUNITY): Payer: Managed Care, Other (non HMO)

## 2023-05-03 ENCOUNTER — Other Ambulatory Visit: Payer: Self-pay

## 2023-05-03 ENCOUNTER — Encounter (HOSPITAL_COMMUNITY): Payer: Self-pay

## 2023-05-03 DIAGNOSIS — Z96643 Presence of artificial hip joint, bilateral: Secondary | ICD-10-CM | POA: Insufficient documentation

## 2023-05-03 DIAGNOSIS — R109 Unspecified abdominal pain: Secondary | ICD-10-CM

## 2023-05-03 DIAGNOSIS — N23 Unspecified renal colic: Secondary | ICD-10-CM | POA: Diagnosis not present

## 2023-05-03 LAB — URINALYSIS, ROUTINE W REFLEX MICROSCOPIC
Bacteria, UA: NONE SEEN
Bilirubin Urine: NEGATIVE
Glucose, UA: NEGATIVE mg/dL
Ketones, ur: NEGATIVE mg/dL
Leukocytes,Ua: NEGATIVE
Nitrite: NEGATIVE
Protein, ur: NEGATIVE mg/dL
RBC / HPF: >50 RBC/hpf (ref 0–5)
Specific Gravity, Urine: 1.021 (ref 1.005–1.030)
pH: 6 (ref 5.0–8.0)

## 2023-05-03 LAB — CBC WITH DIFFERENTIAL/PLATELET
Abs Immature Granulocytes: 0.03 10*3/uL (ref 0.00–0.07)
Basophils Absolute: 0.1 10*3/uL (ref 0.0–0.1)
Basophils Relative: 1 %
Eosinophils Absolute: 0.1 10*3/uL (ref 0.0–0.5)
Eosinophils Relative: 1 %
HCT: 42.4 % (ref 39.0–52.0)
Hemoglobin: 14.8 g/dL (ref 13.0–17.0)
Immature Granulocytes: 0 %
Lymphocytes Relative: 23 %
Lymphs Abs: 2.3 10*3/uL (ref 0.7–4.0)
MCH: 34.1 pg — ABNORMAL HIGH (ref 26.0–34.0)
MCHC: 34.9 g/dL (ref 30.0–36.0)
MCV: 97.7 fL (ref 80.0–100.0)
Monocytes Absolute: 0.9 10*3/uL (ref 0.1–1.0)
Monocytes Relative: 9 %
Neutro Abs: 6.7 10*3/uL (ref 1.7–7.7)
Neutrophils Relative %: 66 %
Platelets: 256 10*3/uL (ref 150–400)
RBC: 4.34 MIL/uL (ref 4.22–5.81)
RDW: 12.7 % (ref 11.5–15.5)
WBC: 10.1 10*3/uL (ref 4.0–10.5)
nRBC: 0 % (ref 0.0–0.2)

## 2023-05-03 LAB — COMPREHENSIVE METABOLIC PANEL
ALT: 26 U/L (ref 0–44)
AST: 22 U/L (ref 15–41)
Albumin: 3.9 g/dL (ref 3.5–5.0)
Alkaline Phosphatase: 54 U/L (ref 38–126)
Anion gap: 9 (ref 5–15)
BUN: 13 mg/dL (ref 6–20)
CO2: 26 mmol/L (ref 22–32)
Calcium: 9.2 mg/dL (ref 8.9–10.3)
Chloride: 106 mmol/L (ref 98–111)
Creatinine, Ser: 0.81 mg/dL (ref 0.61–1.24)
GFR, Estimated: >60 mL/min (ref >60)
Glucose, Bld: 102 mg/dL — ABNORMAL HIGH (ref 70–99)
Potassium: 3.7 mmol/L (ref 3.5–5.1)
Sodium: 141 mmol/L (ref 135–145)
Total Bilirubin: 0.5 mg/dL (ref 0.0–1.2)
Total Protein: 6.9 g/dL (ref 6.5–8.1)

## 2023-05-03 MED ORDER — OXYCODONE-ACETAMINOPHEN 5-325 MG PO TABS
2.0000 | ORAL_TABLET | Freq: Once | ORAL | Status: AC
  Administered 2023-05-03: 2 via ORAL
  Filled 2023-05-03: qty 2

## 2023-05-03 MED ORDER — HYDROCODONE-ACETAMINOPHEN 5-325 MG PO TABS
1.0000 | ORAL_TABLET | Freq: Once | ORAL | Status: AC
  Administered 2023-05-03: 1 via ORAL
  Filled 2023-05-03: qty 1

## 2023-05-03 MED ORDER — KETOROLAC TROMETHAMINE 15 MG/ML IJ SOLN
15.0000 mg | Freq: Once | INTRAMUSCULAR | Status: AC
  Administered 2023-05-03: 15 mg via INTRAVENOUS
  Filled 2023-05-03: qty 1

## 2023-05-03 MED ORDER — NAPROXEN 500 MG PO TABS: 500.0000 mg | ORAL_TABLET | Freq: Two times a day (BID) | ORAL | 0 refills | Status: DC

## 2023-05-03 NOTE — ED Triage Notes (Signed)
 Pt arrived via POV c/o right flank pain that radiates to his groin. Hx of kidney stones. Pt reports hematuria and pain has been present for apprx 3 days.

## 2023-05-03 NOTE — Discharge Instructions (Addendum)
 You are seen today for flank pain likely related to a recently passed kidney stone.  Your CT did not show any obstructing stones and you passed a stone while in the ER.  Your CT did not show any sign obstructing stones otherwise.  You are prescribed naproxen  to use as needed for pain in addition to your home pain medicine.  Back to the ER for new or worsening symptoms otherwise follow-up with urology

## 2023-05-03 NOTE — ED Provider Notes (Signed)
[place] EMERGENCY DEPARTMENT AT [facility] Provider Note   CSN: [id number] Arrival date & time: 05/03/23  1523     History  Chief Complaint  Patient presents with   Flank Pain    Jorge Nichols is a 33 y.o. male.  He has PMH of kidney stones, morbid obesity, avascular process of hip with bilateral hip replacement, hyperparathyroidism.  Presents the ER for concern for possible kidney stone, patient started having pain just in the suprapubic area last night and pain in bilateral testicles today feels like a prior kidney stones.  Reports just prior to the walk in the room he had peed out a kidney stone which is in the strainer.  Pain is slightly improved but still there.  No vomiting, no fevers.  No testicular swelling or tenderness.   Flank Pain       Home Medications Prior to Admission medications   Medication Sig Start Date End Date Taking? Authorizing Provider  allopurinol (ZYLOPRIM) 100 MG tablet Take 200 mg by mouth daily as needed (gout flare). 03/23/23  Yes [provider]  ibuprofen (ADVIL) 200 MG tablet Take 400 mg by mouth every 6 (six) hours as needed for moderate pain.   Yes [provider]  metoprolol succinate (TOPROL-XL) 50 MG 24 hr tablet Take 50 mg by mouth daily. 03/19/23  Yes [provider]  naproxen (NAPROSYN) 500 MG tablet Take 1 tablet (500 mg total) by mouth 2 (two) times daily. 05/03/23  Yes Aliveah Gallant A, PA-C  oxyCODONE-acetaminophen (PERCOCET) 7.5-325 MG tablet Take 1 tablet by mouth every 6 (six) hours as needed for severe pain. 12/22/22  Yes Donnita Falls, FNP  tamsulosin (FLOMAX) 0.4 MG CAPS capsule TAKE 1 CAPSULE BY MOUTH EVERY DAY 11/24/22  Yes Bjorn Pippin, MD  Vitamin D, Ergocalciferol, (DRISDOL) 1.25 MG (50000 UNIT) CAPS capsule Take 50,000 Units by mouth once a week. 03/03/23  Yes [provider]      Allergies    Patient has no known allergies.    Review of Systems   Review of Systems   Genitourinary:  Positive for flank pain.    Physical Exam Updated Vital Signs BP (!) 166/100   Pulse 70   Temp 98.1 F (36.7 C) (Oral)   Resp 19   Ht 5\' 8"  (1.727 m)   Wt 118 kg   SpO2 99%   BMI 39.55 kg/m  Physical Exam Vitals and nursing note reviewed.  Constitutional:      General: He is not in acute distress.    Appearance: He is well-developed.  HENT:     Head: Normocephalic and atraumatic.     Mouth/Throat:     Mouth: Mucous membranes are moist.  Eyes:     Conjunctiva/sclera: Conjunctivae normal.  Cardiovascular:     Rate and Rhythm: Normal rate and regular rhythm.     Heart sounds: No murmur heard. Pulmonary:     Effort: Pulmonary effort is normal. No respiratory distress.     Breath sounds: Normal breath sounds.  Abdominal:     Palpations: Abdomen is soft.     Tenderness: There is no abdominal tenderness.  Genitourinary:    Comments: Pt refused GU exam Musculoskeletal:        General: No swelling.     Cervical back: Neck supple.  Skin:    General: Skin is warm and dry.     Capillary Refill: Capillary refill takes less than 2 seconds.  Neurological:  General: No focal deficit present.     Mental Status: He is alert and oriented to person, place, and time.  Psychiatric:        Mood and Affect: Mood normal.     ED Results / Procedures / Treatments   Labs (all labs ordered are listed, but only abnormal results are displayed) Labs Reviewed  CBC WITH DIFFERENTIAL/PLATELET - Abnormal; Notable for the following components:      Result Value   MCH 34.1 (*)    All other components within normal limits  COMPREHENSIVE METABOLIC PANEL - Abnormal; Notable for the following components:   Glucose, Bld 102 (*)    All other components within normal limits  URINALYSIS, ROUTINE W REFLEX MICROSCOPIC - Abnormal; Notable for the following components:   APPearance HAZY (*)    Hgb urine dipstick LARGE (*)    All other components within normal limits     EKG None  Radiology CT Renal Stone Study Result Date: 05/03/2023 CLINICAL DATA:  Abdominal/flank pain. EXAM: CT ABDOMEN AND PELVIS WITHOUT CONTRAST TECHNIQUE: Multidetector CT imaging of the abdomen and pelvis was performed following the standard protocol without IV contrast. RADIATION DOSE REDUCTION: This exam was performed according to the departmental dose-optimization program which includes automated exposure control, adjustment of the mA and/or kV according to patient size and/or use of iterative reconstruction technique. COMPARISON:  04/06/2023 FINDINGS: Lower chest: No acute abnormality. Hepatobiliary: No focal liver abnormality is seen. No gallstones, gallbladder wall thickening, or biliary dilatation. Pancreas: Unremarkable. No pancreatic ductal dilatation or surrounding inflammatory changes. Spleen: Normal in size without focal abnormality. Adrenals/Urinary Tract: Normal adrenal glands. Bilateral nephrolithiasis. Three punctate stones noted within the upper and lower pole of the right kidney noted. Three stones are noted within the inferior pole of the left kidney. The largest of these measures 3-4 mm, image 44/2. No kidney mass or hydronephrosis. No hydroureter or ureteral calculi. Distal ureters and urinary bladder are obscured by streak artifact from the bilateral hip arthroplasty devices. Within this limitation no convincing evidence for distal ureteral calculi or bladder calculi noted. Stomach/Bowel: Stomach appears normal. The appendix is visualized and appears normal. No bowel wall thickening, inflammation, or distension. Vascular/Lymphatic: No significant vascular findings are present. No enlarged abdominal or pelvic lymph nodes. Reproductive: Prostate is unremarkable. Other: No ascites or focal fluid collections identified. Small fat containing periumbilical hernia. Musculoskeletal: Status post bilateral hip arthroplasty. No acute or suspicious osseous findings. IMPRESSION: 1. No acute  findings within the abdomen or pelvis. 2. Bilateral nephrolithiasis. No signs of obstructive uropathy. 3. Small fat containing periumbilical hernia. Electronically Signed   By: Signa Kell M.D.   On: 05/03/2023 17:41    Procedures Procedures    Medications Ordered in ED Medications  HYDROcodone-acetaminophen (NORCO/VICODIN) 5-325 MG per tablet 1 tablet (1 tablet Oral Given 05/03/23 1614)  ketorolac (TORADOL) 15 MG/ML injection 15 mg (15 mg Intravenous Given 05/03/23 1837)  oxyCODONE-acetaminophen (PERCOCET/ROXICET) 5-325 MG per tablet 2 tablet (2 tablets Oral Given 05/03/23 2028)    ED Course/ Medical Decision Making/ A&P                                 Medical Decision Making Differential diagnosis includes but not limited to ureteral colic, pyelonephritis, cystitis, prostatitis, muscle strain, other  ED course: Patient presents with complaint of possible kidney stone, started having suprapubic pain and groin pain last night, has a history of kidney stones, reports  passing a kidney stone while in the ED.  He has hematuria but no UTI, normal renal function on labs, no leukocytosis.  He is somewhat hypertensive but otherwise vitals are normal.  He is well-appearing.  Due to testicular pain and no obstructing stones on CT I did suggest the patient we should do a testicular exam but he refused this, states it is really more suprapubic it feels just like prior kidney stones.  He was given Toradol without relief, states he takes oxycodone at home and was given oxycodone here and is feeling better.  Discussed he can take NSAIDs at home in addition to his usual pain meds and follow-up with urology.  Given return precautions.  Amount and/or Complexity of Data Reviewed External Data Reviewed: labs and notes. Labs: ordered.    Details: CBC and CMP reassuring, UA shows greater than 50 red blood cells per high-powered field and calcium oxalate crystals Radiology: ordered and independent interpretation  performed. Decision-making details documented in ED Course.    Details: No hydronephrosis, bilateral renal stones, distal urinary bladder dose care of by artifact  Risk Prescription drug management.           Final Clinical Impression(s) / ED Diagnoses Final diagnoses:  Ureteral colic  Flank pain    Rx / DC Orders ED Discharge Orders          Ordered    naproxen (NAPROSYN) 500 MG tablet  2 times daily        05/03/23 1953              Josem Kaufmann 05/03/23 2046    Gloris Manchester, MD 05/04/23 [phone]

## 2023-05-03 NOTE — ED Notes (Signed)
See triage notes. Nad.  

## 2023-07-05 NOTE — ED Triage Notes (Signed)
 Back pain times 4 days

## 2023-09-28 ENCOUNTER — Other Ambulatory Visit: Payer: Self-pay

## 2023-09-28 ENCOUNTER — Emergency Department (HOSPITAL_COMMUNITY)
Admission: EM | Admit: 2023-09-28 | Discharge: 2023-09-28 | Disposition: A | Discharge status: Home / Self Care | Attending: Student | Admitting: Student

## 2023-09-28 ENCOUNTER — Emergency Department (HOSPITAL_COMMUNITY)

## 2023-09-28 ENCOUNTER — Encounter (HOSPITAL_COMMUNITY): Payer: Self-pay | Admitting: Emergency Medicine

## 2023-09-28 DIAGNOSIS — R109 Unspecified abdominal pain: Secondary | ICD-10-CM | POA: Diagnosis present

## 2023-09-28 DIAGNOSIS — N201 Calculus of ureter: Secondary | ICD-10-CM | POA: Diagnosis not present

## 2023-09-28 LAB — BASIC METABOLIC PANEL WITH GFR
Anion gap: 7 (ref 5–15)
BUN: 10 mg/dL (ref 6–20)
CO2: 22 mmol/L (ref 22–32)
Calcium: 8.6 mg/dL — ABNORMAL LOW (ref 8.9–10.3)
Chloride: 108 mmol/L (ref 98–111)
Creatinine, Ser: 0.87 mg/dL (ref 0.61–1.24)
GFR, Estimated: >60 mL/min (ref >60)
Glucose, Bld: 100 mg/dL — ABNORMAL HIGH (ref 70–99)
Potassium: 3.6 mmol/L (ref 3.5–5.1)
Sodium: 137 mmol/L (ref 135–145)

## 2023-09-28 LAB — URINALYSIS, ROUTINE W REFLEX MICROSCOPIC
Bacteria, UA: NONE SEEN
Bilirubin Urine: NEGATIVE
Glucose, UA: NEGATIVE mg/dL
Ketones, ur: NEGATIVE mg/dL
Leukocytes,Ua: NEGATIVE
Nitrite: NEGATIVE
Protein, ur: 30 mg/dL — AB
RBC / HPF: >50 RBC/hpf (ref 0–5)
Specific Gravity, Urine: 1.016 (ref 1.005–1.030)
pH: 8 (ref 5.0–8.0)

## 2023-09-28 LAB — HEPATIC FUNCTION PANEL
ALT: 15 U/L (ref 0–44)
AST: 18 U/L (ref 15–41)
Albumin: 3.5 g/dL (ref 3.5–5.0)
Alkaline Phosphatase: 41 U/L (ref 38–126)
Bilirubin, Direct: 0.1 mg/dL (ref 0.0–0.2)
Indirect Bilirubin: 0.6 mg/dL (ref 0.3–0.9)
Total Bilirubin: 0.7 mg/dL (ref 0.0–1.2)
Total Protein: 6.2 g/dL — ABNORMAL LOW (ref 6.5–8.1)

## 2023-09-28 LAB — CBC
HCT: 42.5 % (ref 39.0–52.0)
Hemoglobin: 14.4 g/dL (ref 13.0–17.0)
MCH: 33.9 pg (ref 26.0–34.0)
MCHC: 33.9 g/dL (ref 30.0–36.0)
MCV: 100 fL (ref 80.0–100.0)
Platelets: 312 K/uL (ref 150–400)
RBC: 4.25 MIL/uL (ref 4.22–5.81)
RDW: 12.1 % (ref 11.5–15.5)
WBC: 7.3 K/uL (ref 4.0–10.5)
nRBC: 0 % (ref 0.0–0.2)

## 2023-09-28 MED ORDER — ONDANSETRON HCL 4 MG/2ML IJ SOLN
4.0000 mg | Freq: Once | INTRAMUSCULAR | Status: AC
  Administered 2023-09-28: 4 mg via INTRAVENOUS
  Filled 2023-09-28: qty 2

## 2023-09-28 MED ORDER — MORPHINE SULFATE (PF) 4 MG/ML IV SOLN
4.0000 mg | Freq: Once | INTRAVENOUS | Status: AC
  Administered 2023-09-28: 4 mg via INTRAVENOUS
  Filled 2023-09-28: qty 1

## 2023-09-28 MED ORDER — KETOROLAC TROMETHAMINE 10 MG PO TABS: 10.0000 mg | ORAL_TABLET | Freq: Four times a day (QID) | ORAL | 0 refills | Status: AC | PRN

## 2023-09-28 MED ORDER — SODIUM CHLORIDE 0.9 % IV BOLUS
1000.0000 mL | Freq: Once | INTRAVENOUS | Status: AC
  Administered 2023-09-28: 1000 mL via INTRAVENOUS

## 2023-09-28 MED ORDER — ONDANSETRON 4 MG PO TBDP: 4.0000 mg | ORAL_TABLET | Freq: Three times a day (TID) | ORAL | 0 refills | Status: AC | PRN

## 2023-09-28 MED ORDER — HYDROMORPHONE HCL 1 MG/ML IJ SOLN
0.5000 mg | Freq: Once | INTRAMUSCULAR | Status: AC
  Administered 2023-09-28: 0.5 mg via INTRAVENOUS
  Filled 2023-09-28: qty 0.5

## 2023-09-28 MED ORDER — KETOROLAC TROMETHAMINE 15 MG/ML IJ SOLN
15.0000 mg | Freq: Once | INTRAMUSCULAR | Status: AC
  Administered 2023-09-28: 15 mg via INTRAVENOUS
  Filled 2023-09-28: qty 1

## 2023-09-28 NOTE — ED Triage Notes (Signed)
 Pt presents with left flank pain x 2 days, history of kidney stones.

## 2023-09-28 NOTE — Discharge Instructions (Signed)
 Please follow-up closely with your primary care doctor and urology on an outpatient basis.  Return to emergency department immediately for any new or worsening symptoms.

## 2023-09-28 NOTE — ED Provider Notes (Signed)
 [facility] EMERGENCY DEPARTMENT AT [facility] Provider Note   CSN: [id number] Arrival date & time: 09/28/23  1449     Patient presents with: Flank Pain (Left)   Jorge Nichols is a 33 y.o. male.   Patient is a 33 year old male who presents emerged department chief complaint of left flank pain with radiation down to his testicles which has been ongoing for approximate the past 2 days.  He notes a history of kidney stones and notes that this feels similar to previous.  He denies any associated recent falls or blunt trauma.  He denies any pain directly to the testicles themselves.  He denies any associated fever, chills.  He admits to associated hematuria and dysuria.  There is been no chest pain or shortness of breath.   Flank Pain       Prior to Admission medications   Medication Sig Start Date End Date Taking? Authorizing Provider  allopurinol  (ZYLOPRIM ) 100 MG tablet Take 200 mg by mouth daily as needed (gout flare). 03/23/23   [provider]  ibuprofen  (ADVIL ) 200 MG tablet Take 400 mg by mouth every 6 (six) hours as needed for moderate pain.    [provider]  metoprolol  succinate (TOPROL -XL) 50 MG 24 hr tablet Take 50 mg by mouth daily. 03/19/23   [provider]  naproxen  (NAPROSYN ) 500 MG tablet Take 1 tablet (500 mg total) by mouth 2 (two) times daily. 05/03/23   Suellen Cantor A, PA-C  oxyCODONE -acetaminophen  (PERCOCET) 7.5-325 MG tablet Take 1 tablet by mouth every 6 (six) hours as needed for severe pain. 12/22/22   Gerldine Lauraine BROCKS, FNP  tamsulosin  (FLOMAX ) 0.4 MG CAPS capsule TAKE 1 CAPSULE BY MOUTH EVERY DAY 11/24/22   Watt Rush, MD  Vitamin D , Ergocalciferol , (DRISDOL ) 1.25 MG (50000 UNIT) CAPS capsule Take 50,000 Units by mouth once a week. 03/03/23   [provider]    Allergies: Patient has no known allergies.    Review of Systems  Genitourinary:  Positive for flank pain.  All other systems reviewed and are  negative.   Updated Vital Signs BP 129/86 (BP Location: Right Arm)   Pulse 86   Temp 98.4 F (36.9 C) (Oral)   Resp 19   Ht 5' 8 (1.727 m)   Wt 115.7 kg   SpO2 100%   BMI 38.77 kg/m   Physical Exam Vitals and nursing note reviewed.  Constitutional:      Appearance: Normal appearance.  HENT:     Head: Normocephalic and atraumatic.     Nose: Nose normal.     Mouth/Throat:     Mouth: Mucous membranes are moist.  Eyes:     Extraocular Movements: Extraocular movements intact.     Conjunctiva/sclera: Conjunctivae normal.     Pupils: Pupils are equal, round, and reactive to light.  Cardiovascular:     Rate and Rhythm: Normal rate and regular rhythm.     Pulses: Normal pulses.     Heart sounds: Normal heart sounds. No murmur heard.    No gallop.  Pulmonary:     Effort: Pulmonary effort is normal. No respiratory distress.     Breath sounds: Normal breath sounds. No stridor. No wheezing, rhonchi or rales.  Abdominal:     General: Abdomen is flat. Bowel sounds are normal. There is no distension.     Palpations: Abdomen is soft.     Tenderness: There is no abdominal tenderness. There is no right CVA tenderness, left CVA tenderness  or guarding.  Musculoskeletal:        General: Normal range of motion.     Cervical back: Normal range of motion and neck supple.     Right lower leg: No edema.     Left lower leg: No edema.  Skin:    General: Skin is warm and dry.     Findings: No bruising or rash.  Neurological:     General: No focal deficit present.     Mental Status: He is alert and oriented to person, place, and time. Mental status is at baseline.  Psychiatric:        Mood and Affect: Mood normal.        Behavior: Behavior normal.        Thought Content: Thought content normal.        Judgment: Judgment normal.     (all labs ordered are listed, but only abnormal results are displayed) Labs Reviewed  URINALYSIS, ROUTINE W REFLEX MICROSCOPIC  BASIC METABOLIC PANEL WITH  GFR  CBC  HEPATIC FUNCTION PANEL    EKG: None  Radiology: DG Abdomen 1 View Result Date: 09/28/2023 CLINICAL DATA:  flank pain, history of kidney stone EXAM: ABDOMEN - 1 VIEW COMPARISON:  None Available. FINDINGS: Nonobstructive bowel gas pattern. No pneumoperitoneum. No organomegaly. 3 mm hyperdensity overlying the left renal shadow. A couple of 2 mm hyperdensities noted along the left pelvis. No acute fracture or destructive lesion. Bilateral hip arthroplasties appear anatomically aligned without dislocation. The lung bases are clear. IMPRESSION: 1. Overlying the left renal shadow, there is a 3 mm calcification, possibly a nonobstructive calculus. A couple of adjacent 2 mm hyperdensities noted along the left pelvis, which could represent pelvic phleboliths or ureteral calculi. 2. Nonobstructive bowel gas pattern. Electronically Signed   By: Rogelia Myers M.D.   On: 09/28/2023 15:34     Procedures   Medications Ordered in the ED  morphine  (PF) 4 MG/ML injection 4 mg (4 mg Intravenous Given 09/28/23 1537)  ondansetron  (ZOFRAN ) injection 4 mg (4 mg Intravenous Given 09/28/23 1535)  sodium chloride  0.9 % bolus 1,000 mL (1,000 mLs Intravenous Bolus 09/28/23 1535)                                    Medical Decision Making Amount and/or Complexity of Data Reviewed Labs: ordered. Radiology: ordered.  Risk Prescription drug management.   This patient presents to the ED for concern of flank pain differential diagnosis includes diverticulitis, pyelonephritis, urinary tract infection, kidney stone    Additional history obtained:  Additional history obtained from medical records External records from outside source obtained and reviewed including medical records   Lab Tests:  I Ordered, and personally interpreted labs.  The pertinent results include: No leukocytosis, no anemia, normal kidney function liver function, unremarkable electrolytes, urinalysis with moderate  hemoglobin   Imaging Studies ordered:  I ordered imaging studies including renal ultrasound, KUB I independently visualized and interpreted imaging which showed no hydronephrosis or hydroureter, punctate lesion in the left side of the pelvis on KUB I agree with the radiologist interpretation   Medicines ordered and prescription drug management:  I ordered medication including morphine , Dilaudid , Toradol , Zofran  for flank pain Reevaluation of the patient after these medicines showed that the patient improved I have reviewed the patients home medicines and have made adjustments as needed   Problem List / ED Course:  Patient is doing well  at this time and is stable for discharge home.  Pain has improved with treatment in the emergency department.  Concerned that he does have a distal left ureteral stone at this time given KUB and ultrasound findings.  Ultrasound demonstrates no signs of hydronephrosis or hydroureter.  Kidney function is within normal limits and urinalysis is unremarkable.  Do suspect that he is stable for discharge home.  Will continue Toradol  at home for his pain.  Do not suspect that admission is warranted at this time.  Patient does have stable vital signs with no indication for sepsis.  The need for close follow-up with PCP and urologist was discussed.  Patient voiced understanding the plan and had no additional questions.  He is already on 10 mg of oxycodone  4 times a day and he is in agreement to not be placed on any further narcotics.   Social Determinants of Health:  None        Final diagnoses:  None    ED Discharge Orders     None          Daralene Lonni JONETTA DEVONNA 09/28/23 1724    Albertina Dixon, MD 09/28/23 [phone]

## 2023-11-11 ENCOUNTER — Encounter (HOSPITAL_COMMUNITY): Payer: Self-pay

## 2023-11-11 ENCOUNTER — Emergency Department (HOSPITAL_COMMUNITY)

## 2023-11-11 ENCOUNTER — Emergency Department (HOSPITAL_COMMUNITY)
Admission: EM | Admit: 2023-11-11 | Discharge: 2023-11-11 | Disposition: A | Discharge status: Home / Self Care | Attending: Emergency Medicine | Admitting: Emergency Medicine

## 2023-11-11 ENCOUNTER — Other Ambulatory Visit: Payer: Self-pay

## 2023-11-11 DIAGNOSIS — R1030 Lower abdominal pain, unspecified: Secondary | ICD-10-CM | POA: Diagnosis present

## 2023-11-11 DIAGNOSIS — N132 Hydronephrosis with renal and ureteral calculous obstruction: Secondary | ICD-10-CM | POA: Diagnosis not present

## 2023-11-11 DIAGNOSIS — N200 Calculus of kidney: Secondary | ICD-10-CM

## 2023-11-11 LAB — URINALYSIS, ROUTINE W REFLEX MICROSCOPIC
Bacteria, UA: NONE SEEN
Bilirubin Urine: NEGATIVE
Glucose, UA: NEGATIVE mg/dL
Ketones, ur: NEGATIVE mg/dL
Nitrite: NEGATIVE
Protein, ur: NEGATIVE mg/dL
RBC / HPF: >50 RBC/hpf (ref 0–5)
Specific Gravity, Urine: 1.018 (ref 1.005–1.030)
pH: 7 (ref 5.0–8.0)

## 2023-11-11 LAB — BASIC METABOLIC PANEL WITH GFR
Anion gap: 11 (ref 5–15)
BUN: 12 mg/dL (ref 6–20)
CO2: 22 mmol/L (ref 22–32)
Calcium: 9.5 mg/dL (ref 8.9–10.3)
Chloride: 105 mmol/L (ref 98–111)
Creatinine, Ser: 0.91 mg/dL (ref 0.61–1.24)
GFR, Estimated: >60 mL/min (ref >60)
Glucose, Bld: 95 mg/dL (ref 70–99)
Potassium: 3.5 mmol/L (ref 3.5–5.1)
Sodium: 138 mmol/L (ref 135–145)

## 2023-11-11 LAB — CBC WITH DIFFERENTIAL/PLATELET
Abs Immature Granulocytes: 0.01 K/uL (ref 0.00–0.07)
Basophils Absolute: 0.1 K/uL (ref 0.0–0.1)
Basophils Relative: 1 %
Eosinophils Absolute: 0.4 K/uL (ref 0.0–0.5)
Eosinophils Relative: 5 %
HCT: 42.4 % (ref 39.0–52.0)
Hemoglobin: 14.5 g/dL (ref 13.0–17.0)
Immature Granulocytes: 0 %
Lymphocytes Relative: 32 %
Lymphs Abs: 2.3 K/uL (ref 0.7–4.0)
MCH: 33.4 pg (ref 26.0–34.0)
MCHC: 34.2 g/dL (ref 30.0–36.0)
MCV: 97.7 fL (ref 80.0–100.0)
Monocytes Absolute: 0.7 K/uL (ref 0.1–1.0)
Monocytes Relative: 9 %
Neutro Abs: 3.6 K/uL (ref 1.7–7.7)
Neutrophils Relative %: 53 %
Platelets: 334 K/uL (ref 150–400)
RBC: 4.34 MIL/uL (ref 4.22–5.81)
RDW: 11.9 % (ref 11.5–15.5)
WBC: 7 K/uL (ref 4.0–10.5)
nRBC: 0 % (ref 0.0–0.2)

## 2023-11-11 MED ORDER — HYDROMORPHONE HCL 1 MG/ML IJ SOLN
1.0000 mg | Freq: Once | INTRAMUSCULAR | Status: AC
  Administered 2023-11-11: 1 mg via INTRAMUSCULAR
  Filled 2023-11-11: qty 1

## 2023-11-11 MED ORDER — CEPHALEXIN 500 MG PO CAPS
500.0000 mg | ORAL_CAPSULE | Freq: Once | ORAL | Status: AC
  Administered 2023-11-11: 500 mg via ORAL
  Filled 2023-11-11: qty 1

## 2023-11-11 MED ORDER — IBUPROFEN 800 MG PO TABS: 800.0000 mg | ORAL_TABLET | Freq: Three times a day (TID) | ORAL | 0 refills | Status: AC | PRN

## 2023-11-11 MED ORDER — OXYCODONE-ACETAMINOPHEN 5-325 MG PO TABS
1.0000 | ORAL_TABLET | Freq: Once | ORAL | Status: AC
  Administered 2023-11-11: 1 via ORAL
  Filled 2023-11-11: qty 1

## 2023-11-11 MED ORDER — CEPHALEXIN 500 MG PO CAPS: 500.0000 mg | ORAL_CAPSULE | Freq: Four times a day (QID) | ORAL | 0 refills | Status: AC

## 2023-11-11 MED ORDER — TAMSULOSIN HCL 0.4 MG PO CAPS: 0.4000 mg | ORAL_CAPSULE | Freq: Every day | ORAL | 0 refills | Status: AC

## 2023-11-11 MED ORDER — KETOROLAC TROMETHAMINE 30 MG/ML IJ SOLN
60.0000 mg | Freq: Once | INTRAMUSCULAR | Status: AC
Start: 2023-11-11 — End: 2023-11-11
  Administered 2023-11-11: 60 mg via INTRAMUSCULAR
  Filled 2023-11-11: qty 2

## 2023-11-11 NOTE — ED Notes (Signed)
 ED Provider at bedside.

## 2023-11-11 NOTE — ED Triage Notes (Signed)
 Pt reports hx of kidney stones and has had left side flank pain for the past couple days but pain has now moved into his groin.

## 2023-11-11 NOTE — Discharge Instructions (Signed)
Follow-up with your urologist next week °

## 2023-11-12 NOTE — ED Provider Notes (Signed)
 [facility] EMERGENCY DEPARTMENT AT [facility] Provider Note   CSN: [id number] Arrival date & time: 11/11/23  1933     Patient presents with: Flank Pain   Jorge Nichols is a 33 y.o. male.   Patient complains of flank pain and lower abdominal discomfort no fevers no chills no vomiting.  Patient has a history of kidney stones  The history is provided by the patient and medical records. No language interpreter was used.  Flank Pain This is a new problem. The current episode started 12 to 24 hours ago. The problem occurs constantly. The problem has not changed since onset.Pertinent negatives include no chest pain, no abdominal pain and no headaches. Nothing aggravates the symptoms. Nothing relieves the symptoms. He has tried nothing for the symptoms. The treatment provided no relief.       Prior to Admission medications   Medication Sig Start Date End Date Taking? Authorizing Provider  cephALEXin  (KEFLEX ) 500 MG capsule Take 1 capsule (500 mg total) by mouth 4 (four) times daily. 11/11/23  Yes Aamya Orellana, MD  ibuprofen  (ADVIL ) 800 MG tablet Take 1 tablet (800 mg total) by mouth every 8 (eight) hours as needed for moderate pain (pain score 4-6). 11/11/23  Yes Suzette Pac, MD  tamsulosin  (FLOMAX ) 0.4 MG CAPS capsule Take 1 capsule (0.4 mg total) by mouth daily. 11/11/23  Yes Suzette Pac, MD  allopurinol  (ZYLOPRIM ) 100 MG tablet Take 200 mg by mouth daily as needed (gout flare). 03/23/23   [provider]  ketorolac  (TORADOL ) 10 MG tablet Take 1 tablet (10 mg total) by mouth every 6 (six) hours as needed. 09/28/23   Daralene Lonni BIRCH, PA-C  metoprolol  succinate (TOPROL -XL) 50 MG 24 hr tablet Take 50 mg by mouth daily. 03/19/23   [provider]  ondansetron  (ZOFRAN -ODT) 4 MG disintegrating tablet Take 1 tablet (4 mg total) by mouth every 8 (eight) hours as needed for nausea or vomiting. 09/28/23   Daralene Lonni BIRCH, PA-C  oxyCODONE -acetaminophen   (PERCOCET) 7.5-325 MG tablet Take 1 tablet by mouth every 6 (six) hours as needed for severe pain. 12/22/22   Larocco, Sarah C, FNP  Vitamin D , Ergocalciferol , (DRISDOL ) 1.25 MG (50000 UNIT) CAPS capsule Take 50,000 Units by mouth once a week. 03/03/23   [provider]    Allergies: Patient has no known allergies.    Review of Systems  Constitutional:  Negative for appetite change and fatigue.  HENT:  Negative for congestion, ear discharge and sinus pressure.   Eyes:  Negative for discharge.  Respiratory:  Negative for cough.   Cardiovascular:  Negative for chest pain.  Gastrointestinal:  Negative for abdominal pain and diarrhea.  Genitourinary:  Positive for flank pain. Negative for frequency and hematuria.  Musculoskeletal:  Negative for back pain.  Skin:  Negative for rash.  Neurological:  Negative for seizures and headaches.  Psychiatric/Behavioral:  Negative for hallucinations.     Updated Vital Signs BP (!) 150/86 (BP Location: Left Arm)   Pulse 80   Temp 98.7 F (37.1 C)   Resp 17   Ht 5' 8 (1.727 m)   Wt 117.9 kg   SpO2 97%   BMI 39.53 kg/m   Physical Exam Vitals and nursing note reviewed.  Constitutional:      Appearance: He is well-developed.  HENT:     Head: Normocephalic.     Nose: Nose normal.  Eyes:     General: No scleral icterus.    Conjunctiva/sclera: Conjunctivae normal.  Neck:     Thyroid: No thyromegaly.  Cardiovascular:     Rate and Rhythm: Normal rate and regular rhythm.     Heart sounds: No murmur heard.    No friction rub. No gallop.  Pulmonary:     Breath sounds: No stridor. No wheezing or rales.  Chest:     Chest wall: No tenderness.  Abdominal:     General: There is no distension.     Tenderness: There is abdominal tenderness. There is no rebound.  Musculoskeletal:        General: Normal range of motion.     Cervical back: Neck supple.  Lymphadenopathy:     Cervical: No cervical adenopathy.  Skin:    Findings: No  erythema or rash.  Neurological:     Mental Status: He is alert and oriented to person, place, and time.     Motor: No abnormal muscle tone.     Coordination: Coordination normal.  Psychiatric:        Behavior: Behavior normal.     (all labs ordered are listed, but only abnormal results are displayed) Labs Reviewed  URINALYSIS, ROUTINE W REFLEX MICROSCOPIC - Abnormal; Notable for the following components:      Result Value   APPearance HAZY (*)    Hgb urine dipstick LARGE (*)    Leukocytes,Ua TRACE (*)    All other components within normal limits  URINE CULTURE  CBC WITH DIFFERENTIAL/PLATELET  BASIC METABOLIC PANEL WITH GFR    EKG: None  Radiology: CT Renal Stone Study Result Date: 11/11/2023 CLINICAL DATA:  Left flank pain extending into the left groin. History of kidney stones. EXAM: CT ABDOMEN AND PELVIS WITHOUT CONTRAST TECHNIQUE: Multidetector CT imaging of the abdomen and pelvis was performed following the standard protocol without IV contrast. RADIATION DOSE REDUCTION: This exam was performed according to the departmental dose-optimization program which includes automated exposure control, adjustment of the mA and/or kV according to patient size and/or use of iterative reconstruction technique. COMPARISON:  CTs without contrast 05/03/2023 and 04/06/2023. FINDINGS: Lower chest: No acute abnormality. Hepatobiliary: The liver is 19 cm length and mildly steatotic. No focal abnormality is seen without contrast. Gallbladder and bile ducts are unremarkable. Pancreas: Unremarkable without contrast. Spleen: Normal in size and noncontrast attenuation. There is a splenule at the lateral splenic tip. Adrenals/Urinary Tract: There is no adrenal mass. No contour deforming abnormality of the unenhanced kidneys. There are multiple punctate up to 3 mm nonobstructive caliceal stones scattered within the right kidney. There are punctate up to 2 mm caliceal stones in the lower pole of the left kidney.  The terminal ureters are obscured below the level of the acetabulum due to bilateral hip replacements causing spray artifacts. There is no left hydronephrosis, no stone in the visualized left ureter. On the right, there is slight hydronephrosis above a 1 mm proximal right ureteral stone superimposing along the inferior aspect of the mid right L3 transverse process. The visualized right ureter is otherwise clear. The bladder is obscured by the hip replacements except for the dome, which is unremarkable. Stomach/Bowel: No dilatation or wall thickening including the appendix. There is uncomplicated diverticulosis along the descending and sigmoid colon. Vascular/Lymphatic: No significant vascular findings are present. No enlarged abdominal or pelvic lymph nodes. Reproductive: The prostate obscured by the hip replacements. Other: Small umbilical fat hernia. No free fluid, free hemorrhage or free air. No incarcerated Musculoskeletal: There are bilateral hip replacements. Slight lumbar dextroscoliosis with degenerative change. There is facet hypertrophy  with acquired spinal stenosis at the lumbosacral junction. There is transitional anatomy at the lumbosacral junction. IMPRESSION: 1. 1 mm proximal right ureteral stone with slight right hydronephrosis. 2. No obstructing stone is seen on the left with the terminal ureters obscured by the hip replacements. 3. Bilateral nonobstructive nephrolithiasis. 4. Mildly prominent liver with mild steatosis. 5. Diverticulosis without evidence of diverticulitis. 6. Small umbilical fat hernia. Electronically Signed   By: Francis Quam M.D.   On: 11/11/2023 20:21     Procedures   Medications Ordered in the ED  ketorolac  (TORADOL ) 30 MG/ML injection 60 mg (60 mg Intramuscular Given 11/11/23 2000)  HYDROmorphone  (DILAUDID ) injection 1 mg (1 mg Intramuscular Given 11/11/23 2200)  oxyCODONE -acetaminophen  (PERCOCET/ROXICET) 5-325 MG per tablet 1 tablet (1 tablet Oral Given 11/11/23 2337)   cephALEXin  (KEFLEX ) capsule 500 mg (500 mg Oral Given 11/11/23 2336)                                    Medical Decision Making Amount and/or Complexity of Data Reviewed Labs: ordered. Radiology: ordered.  Risk Prescription drug management.   Patient with 1 mm ureteral stone on the right.  Also possible urinary tract infection.  He is sent home with antibiotics pain medicine nausea medicine will follow-up with urology     Final diagnoses:  Kidney stone    ED Discharge Orders          Ordered    cephALEXin  (KEFLEX ) 500 MG capsule  4 times daily        11/11/23 2320    tamsulosin  (FLOMAX ) 0.4 MG CAPS capsule  Daily        11/11/23 2320    ibuprofen  (ADVIL ) 800 MG tablet  Every 8 hours PRN        11/11/23 2320               Suzette Pac, MD 11/12/23 1154

## 2024-01-10 ENCOUNTER — Emergency Department (HOSPITAL_COMMUNITY)
Admission: EM | Admit: 2024-01-10 | Discharge: 2024-01-10 | Disposition: A | Discharge status: Home / Self Care | Attending: Emergency Medicine | Admitting: Emergency Medicine

## 2024-01-10 ENCOUNTER — Emergency Department (HOSPITAL_COMMUNITY)

## 2024-01-10 DIAGNOSIS — N200 Calculus of kidney: Secondary | ICD-10-CM

## 2024-01-10 DIAGNOSIS — R10A1 Flank pain, right side: Secondary | ICD-10-CM | POA: Diagnosis present

## 2024-01-10 DIAGNOSIS — N202 Calculus of kidney with calculus of ureter: Secondary | ICD-10-CM | POA: Insufficient documentation

## 2024-01-10 DIAGNOSIS — N23 Unspecified renal colic: Secondary | ICD-10-CM

## 2024-01-10 LAB — URINALYSIS, ROUTINE W REFLEX MICROSCOPIC
Bacteria, UA: NONE SEEN
Bilirubin Urine: NEGATIVE
Glucose, UA: NEGATIVE mg/dL
Ketones, ur: NEGATIVE mg/dL
Leukocytes,Ua: NEGATIVE
Nitrite: NEGATIVE
Protein, ur: 30 mg/dL — AB
RBC / HPF: >50 RBC/hpf (ref 0–5)
Specific Gravity, Urine: 1.02 (ref 1.005–1.030)
pH: 5 (ref 5.0–8.0)

## 2024-01-10 LAB — BASIC METABOLIC PANEL WITH GFR
Anion gap: 12 (ref 5–15)
BUN: 9 mg/dL (ref 6–20)
CO2: 27 mmol/L (ref 22–32)
Calcium: 9.5 mg/dL (ref 8.9–10.3)
Chloride: 102 mmol/L (ref 98–111)
Creatinine, Ser: 1.01 mg/dL (ref 0.61–1.24)
GFR, Estimated: >60 mL/min (ref >60)
Glucose, Bld: 98 mg/dL (ref 70–99)
Potassium: 3.2 mmol/L — ABNORMAL LOW (ref 3.5–5.1)
Sodium: 141 mmol/L (ref 135–145)

## 2024-01-10 LAB — CBC WITH DIFFERENTIAL/PLATELET
Abs Immature Granulocytes: 0.03 K/uL (ref 0.00–0.07)
Basophils Absolute: 0.1 K/uL (ref 0.0–0.1)
Basophils Relative: 1 %
Eosinophils Absolute: 0.3 K/uL (ref 0.0–0.5)
Eosinophils Relative: 3 %
HCT: 46.3 % (ref 39.0–52.0)
Hemoglobin: 16.2 g/dL (ref 13.0–17.0)
Immature Granulocytes: 0 %
Lymphocytes Relative: 24 %
Lymphs Abs: 2.2 K/uL (ref 0.7–4.0)
MCH: 33.6 pg (ref 26.0–34.0)
MCHC: 35 g/dL (ref 30.0–36.0)
MCV: 96.1 fL (ref 80.0–100.0)
Monocytes Absolute: 0.8 K/uL (ref 0.1–1.0)
Monocytes Relative: 8 %
Neutro Abs: 5.9 K/uL (ref 1.7–7.7)
Neutrophils Relative %: 64 %
Platelets: 380 K/uL (ref 150–400)
RBC: 4.82 MIL/uL (ref 4.22–5.81)
RDW: 12.4 % (ref 11.5–15.5)
WBC: 9.3 K/uL (ref 4.0–10.5)
nRBC: 0 % (ref 0.0–0.2)

## 2024-01-10 MED ORDER — KETOROLAC TROMETHAMINE 30 MG/ML IJ SOLN
30.0000 mg | Freq: Once | INTRAMUSCULAR | Status: AC
  Administered 2024-01-10: 30 mg via INTRAMUSCULAR
  Filled 2024-01-10: qty 1

## 2024-01-10 MED ORDER — HYDROMORPHONE HCL 1 MG/ML IJ SOLN
1.0000 mg | Freq: Once | INTRAMUSCULAR | Status: AC
  Administered 2024-01-10: 1 mg via INTRAVENOUS
  Filled 2024-01-10 (×2): qty 1

## 2024-01-10 MED ORDER — HYDROMORPHONE HCL 1 MG/ML IJ SOLN
1.0000 mg | Freq: Once | INTRAMUSCULAR | Status: AC
Start: 2024-01-10 — End: 2024-01-10
  Administered 2024-01-10: 1 mg via INTRAVENOUS
  Filled 2024-01-10: qty 1

## 2024-01-10 MED ORDER — OXYCODONE-ACETAMINOPHEN 5-325 MG PO TABS
2.0000 | ORAL_TABLET | Freq: Once | ORAL | Status: AC
  Administered 2024-01-10: 2 via ORAL
  Filled 2024-01-10: qty 2

## 2024-01-10 MED ORDER — ONDANSETRON HCL 4 MG/2ML IJ SOLN
4.0000 mg | Freq: Once | INTRAMUSCULAR | Status: AC
  Administered 2024-01-10: 4 mg via INTRAVENOUS
  Filled 2024-01-10: qty 2

## 2024-01-10 MED ORDER — TAMSULOSIN HCL 0.4 MG PO CAPS
0.4000 mg | ORAL_CAPSULE | Freq: Once | ORAL | Status: AC
  Administered 2024-01-10: 0.4 mg via ORAL
  Filled 2024-01-10: qty 1

## 2024-01-10 NOTE — Discharge Instructions (Signed)
 Make sure you are drinking plenty of fluids, continue using your Flomax .  Plan follow-up with Dr. Wrenn for any persistent or worsening symptoms, especially pain, fever or nausea and vomiting.  I recommend straining your urine so you will know when you have passed the stone.

## 2024-01-10 NOTE — ED Triage Notes (Signed)
 Pt c/o R flank pain radiating to groin and hematuria that started yesterday. Pt has hx of frequent kidney stones, stating he passes one per month usually. Pt also c/o nausea.

## 2024-01-10 NOTE — ED Provider Notes (Signed)
 [place] EMERGENCY DEPARTMENT AT [facility] Provider Note   CSN: [id number] Arrival date & time: 01/10/24  8061     Patient presents with: Flank Pain   Jorge Nichols is a 33 y.o. male.  {Add pertinent medical, surgical, social history, OB history to YEP:67052} The history is provided by the patient.  Flank Pain This is a recurrent problem. The current episode started 2 days ago. The problem has been gradually worsening. Associated symptoms include abdominal pain. Pertinent negatives include no chest pain and no shortness of breath. Nothing aggravates the symptoms. Nothing relieves the symptoms.   Right flank pain with frequent urination,  sensation of not completely emptying bladder,  similar to prior kidney stone passage,  reports passing about 1 kidney stone per month on average.  Under care of Dr Watt. Hematuria, no penile dc.  No fevers. Nausea but no emesis.      Prior to Admission medications   Medication Sig Start Date End Date Taking? Authorizing Provider  allopurinol  (ZYLOPRIM ) 100 MG tablet Take 200 mg by mouth daily as needed (gout flare). 03/23/23   [provider]  cephALEXin  (KEFLEX ) 500 MG capsule Take 1 capsule (500 mg total) by mouth 4 (four) times daily. 11/11/23   Suzette Pac, MD  ibuprofen  (ADVIL ) 800 MG tablet Take 1 tablet (800 mg total) by mouth every 8 (eight) hours as needed for moderate pain (pain score 4-6). 11/11/23   Suzette Pac, MD  ketorolac  (TORADOL ) 10 MG tablet Take 1 tablet (10 mg total) by mouth every 6 (six) hours as needed. 09/28/23   Daralene Lonni BIRCH, PA-C  metoprolol  succinate (TOPROL -XL) 50 MG 24 hr tablet Take 50 mg by mouth daily. 03/19/23   [provider]  ondansetron  (ZOFRAN -ODT) 4 MG disintegrating tablet Take 1 tablet (4 mg total) by mouth every 8 (eight) hours as needed for nausea or vomiting. 09/28/23   Daralene Lonni BIRCH, PA-C  oxyCODONE -acetaminophen  (PERCOCET) 7.5-325 MG tablet Take 1 tablet  by mouth every 6 (six) hours as needed for severe pain. 12/22/22   Gerldine Lauraine BROCKS, FNP  tamsulosin  (FLOMAX ) 0.4 MG CAPS capsule Take 1 capsule (0.4 mg total) by mouth daily. 11/11/23   Zammit, Joseph, MD  Vitamin D , Ergocalciferol , (DRISDOL ) 1.25 MG (50000 UNIT) CAPS capsule Take 50,000 Units by mouth once a week. 03/03/23   [provider]    Allergies: Patient has no known allergies.    Review of Systems  Constitutional:  Negative for chills and fever.  HENT: Negative.    Respiratory:  Negative for shortness of breath.   Cardiovascular:  Negative for chest pain.  Gastrointestinal:  Positive for abdominal pain.  Genitourinary:  Positive for difficulty urinating and flank pain. Negative for penile discharge.  Skin: Negative.   All other systems reviewed and are negative.   Updated Vital Signs BP (!) 157/102 (BP Location: Right Arm)   Pulse 84   Temp 98.6 F (37 C) (Oral)   Resp 20   SpO2 97%   Physical Exam Vitals and nursing note reviewed.  Constitutional:      Appearance: He is well-developed.  HENT:     Head: Normocephalic and atraumatic.  Eyes:     Conjunctiva/sclera: Conjunctivae normal.  Cardiovascular:     Rate and Rhythm: Normal rate and regular rhythm.     Heart sounds: Normal heart sounds.  Pulmonary:     Effort: Pulmonary effort is normal.     Breath sounds: Normal breath sounds. No wheezing.  Abdominal:     General: Bowel sounds are normal.     Palpations: Abdomen is soft.     Tenderness: There is no abdominal tenderness. There is right CVA tenderness. There is no guarding.     Comments: Pain radiating right flank to RLQ, not reproducible on exam.   Musculoskeletal:        General: Normal range of motion.     Cervical back: Normal range of motion.  Skin:    General: Skin is warm and dry.  Neurological:     Mental Status: He is alert.     (all labs ordered are listed, but only abnormal results are displayed) Labs Reviewed  URINALYSIS,  ROUTINE W REFLEX MICROSCOPIC - Abnormal; Notable for the following components:      Result Value   APPearance HAZY (*)    Hgb urine dipstick LARGE (*)    Protein, ur 30 (*)    All other components within normal limits  BASIC METABOLIC PANEL WITH GFR - Abnormal; Notable for the following components:   Potassium 3.2 (*)    All other components within normal limits  CBC WITH DIFFERENTIAL/PLATELET    EKG: None  Radiology: CT Renal Stone Study Result Date: 01/10/2024 CLINICAL DATA:  Right flank pain radiating to inguinal region, hematuria EXAM: CT ABDOMEN AND PELVIS WITHOUT CONTRAST TECHNIQUE: Multidetector CT imaging of the abdomen and pelvis was performed following the standard protocol without IV contrast. RADIATION DOSE REDUCTION: This exam was performed according to the departmental dose-optimization program which includes automated exposure control, adjustment of the mA and/or kV according to patient size and/or use of iterative reconstruction technique. COMPARISON:  11/11/2023 FINDINGS: Lower chest: No acute pleural or parenchymal lung disease. Hepatobiliary: Unremarkable unenhanced appearance of the liver and gallbladder. Pancreas: Unremarkable unenhanced appearance. Spleen: Unremarkable unenhanced appearance. Adrenals/Urinary Tract: There are 2 nonobstructing right renal calculi, largest measuring 5 mm in the upper pole. There are 2 nonobstructing left renal calculi, largest measuring 4 mm in the lower pole. No obstructive uropathy within either kidney. The adrenals are unremarkable. Evaluation of the bladder is limited due to streak artifact from hip arthroplasties as well as incomplete distension. Stomach/Bowel: No bowel obstruction or ileus. Normal appendix right lower quadrant. No bowel wall thickening or inflammatory change. Vascular/Lymphatic: No significant vascular findings are present. No enlarged abdominal or pelvic lymph nodes. Reproductive: Prostate is unremarkable. Other: No free  fluid or free intraperitoneal gas. No abdominal wall hernia. Musculoskeletal: Unremarkable bilateral hip arthroplasties. No acute or destructive bony abnormalities. Reconstructed images demonstrate no additional findings. IMPRESSION: 1. Bilateral nonobstructing nephrolithiasis. No obstructive uropathy within either kidney. 2. Otherwise unremarkable unenhanced CT of the abdomen and pelvis. Electronically Signed   By: Ozell Daring M.D.   On: 01/10/2024 20:25    {Document cardiac monitor, telemetry assessment procedure when appropriate:32947} Procedures   Medications Ordered in the ED  HYDROmorphone  (DILAUDID ) injection 1 mg (has no administration in time range)  ondansetron  (ZOFRAN ) injection 4 mg (has no administration in time range)  ketorolac  (TORADOL ) 30 MG/ML injection 30 mg (30 mg Intramuscular Given 01/10/24 1957)      {Click here for ABCD2, HEART and other calculators REFRESH Note before signing:1}                              Medical Decision Making Amount and/or Complexity of Data Reviewed Labs: ordered. Radiology: ordered.  Risk Prescription drug management.     {Document critical care  time when appropriate  Document review of labs and clinical decision tools ie CHADS2VASC2, etc  Document your independent review of radiology images and any outside records  Document your discussion with family members, caretakers and with consultants  Document social determinants of health affecting pt's care  Document your decision making why or why not admission, treatments were needed:32947:::1}   Final diagnoses:  None    ED Discharge Orders     None

## 2024-01-22 ENCOUNTER — Emergency Department (HOSPITAL_COMMUNITY)
Admission: EM | Admit: 2024-01-22 | Discharge: 2024-01-22 | Disposition: A | Discharge status: Home / Self Care | Attending: Emergency Medicine | Admitting: Emergency Medicine

## 2024-01-22 ENCOUNTER — Other Ambulatory Visit: Payer: Self-pay

## 2024-01-22 ENCOUNTER — Encounter (HOSPITAL_COMMUNITY): Payer: Self-pay | Admitting: Emergency Medicine

## 2024-01-22 ENCOUNTER — Emergency Department (HOSPITAL_COMMUNITY)

## 2024-01-22 DIAGNOSIS — Y9273 Farm field as the place of occurrence of the external cause: Secondary | ICD-10-CM | POA: Diagnosis not present

## 2024-01-22 DIAGNOSIS — R079 Chest pain, unspecified: Secondary | ICD-10-CM | POA: Diagnosis not present

## 2024-01-22 DIAGNOSIS — R109 Unspecified abdominal pain: Secondary | ICD-10-CM | POA: Insufficient documentation

## 2024-01-22 DIAGNOSIS — S20219A Contusion of unspecified front wall of thorax, initial encounter: Secondary | ICD-10-CM

## 2024-01-22 DIAGNOSIS — M542 Cervicalgia: Secondary | ICD-10-CM | POA: Insufficient documentation

## 2024-01-22 DIAGNOSIS — S0990XA Unspecified injury of head, initial encounter: Secondary | ICD-10-CM

## 2024-01-22 DIAGNOSIS — S161XXA Strain of muscle, fascia and tendon at neck level, initial encounter: Secondary | ICD-10-CM

## 2024-01-22 DIAGNOSIS — R519 Headache, unspecified: Secondary | ICD-10-CM | POA: Insufficient documentation

## 2024-01-22 LAB — URINALYSIS, ROUTINE W REFLEX MICROSCOPIC
Bacteria, UA: NONE SEEN
Bilirubin Urine: NEGATIVE
Glucose, UA: NEGATIVE mg/dL
Ketones, ur: NEGATIVE mg/dL
Leukocytes,Ua: NEGATIVE
Nitrite: NEGATIVE
Protein, ur: NEGATIVE mg/dL
Specific Gravity, Urine: 1.008 (ref 1.005–1.030)
pH: 6 (ref 5.0–8.0)

## 2024-01-22 LAB — COMPREHENSIVE METABOLIC PANEL WITH GFR
ALT: 25 U/L (ref 0–44)
AST: 30 U/L (ref 15–41)
Albumin: 4.7 g/dL (ref 3.5–5.0)
Alkaline Phosphatase: 66 U/L (ref 38–126)
Anion gap: 13 (ref 5–15)
BUN: 9 mg/dL (ref 6–20)
CO2: 24 mmol/L (ref 22–32)
Calcium: 9.3 mg/dL (ref 8.9–10.3)
Chloride: 104 mmol/L (ref 98–111)
Creatinine, Ser: 0.8 mg/dL (ref 0.61–1.24)
GFR, Estimated: >60 mL/min (ref >60)
Glucose, Bld: 97 mg/dL (ref 70–99)
Potassium: 3.8 mmol/L (ref 3.5–5.1)
Sodium: 141 mmol/L (ref 135–145)
Total Bilirubin: 0.2 mg/dL (ref 0.0–1.2)
Total Protein: 7.5 g/dL (ref 6.5–8.1)

## 2024-01-22 LAB — CBC WITH DIFFERENTIAL/PLATELET
Abs Immature Granulocytes: 0.02 K/uL (ref 0.00–0.07)
Basophils Absolute: 0.1 K/uL (ref 0.0–0.1)
Basophils Relative: 1 %
Eosinophils Absolute: 0.3 K/uL (ref 0.0–0.5)
Eosinophils Relative: 3 %
HCT: 48.8 % (ref 39.0–52.0)
Hemoglobin: 16.8 g/dL (ref 13.0–17.0)
Immature Granulocytes: 0 %
Lymphocytes Relative: 27 %
Lymphs Abs: 2.5 K/uL (ref 0.7–4.0)
MCH: 33.4 pg (ref 26.0–34.0)
MCHC: 34.4 g/dL (ref 30.0–36.0)
MCV: 97 fL (ref 80.0–100.0)
Monocytes Absolute: 0.5 K/uL (ref 0.1–1.0)
Monocytes Relative: 5 %
Neutro Abs: 5.8 K/uL (ref 1.7–7.7)
Neutrophils Relative %: 64 %
Platelets: 345 K/uL (ref 150–400)
RBC: 5.03 MIL/uL (ref 4.22–5.81)
RDW: 12.6 % (ref 11.5–15.5)
WBC: 9.1 K/uL (ref 4.0–10.5)
nRBC: 0 % (ref 0.0–0.2)

## 2024-01-22 LAB — TROPONIN T, HIGH SENSITIVITY: Troponin T High Sensitivity: <15 ng/L (ref 0–19)

## 2024-01-22 LAB — LIPASE, BLOOD: Lipase: 43 U/L (ref 11–51)

## 2024-01-22 MED ORDER — HYDROMORPHONE HCL 1 MG/ML IJ SOLN
1.0000 mg | Freq: Once | INTRAMUSCULAR | Status: AC
  Administered 2024-01-22: 1 mg via INTRAVENOUS
  Filled 2024-01-22: qty 1

## 2024-01-22 MED ORDER — METHOCARBAMOL 500 MG PO TABS
750.0000 mg | ORAL_TABLET | Freq: Once | ORAL | Status: AC
  Administered 2024-01-22: 750 mg via ORAL
  Filled 2024-01-22: qty 2

## 2024-01-22 MED ORDER — METHOCARBAMOL 750 MG PO TABS: 750.0000 mg | ORAL_TABLET | Freq: Three times a day (TID) | ORAL | 0 refills | Status: AC

## 2024-01-22 MED ORDER — SODIUM CHLORIDE 0.9 % IV BOLUS
1000.0000 mL | Freq: Once | INTRAVENOUS | Status: AC
  Administered 2024-01-22: 1000 mL via INTRAVENOUS

## 2024-01-22 MED ORDER — OXYCODONE-ACETAMINOPHEN 5-325 MG PO TABS
1.0000 | ORAL_TABLET | Freq: Once | ORAL | Status: AC
  Administered 2024-01-22: 1 via ORAL
  Filled 2024-01-22: qty 1

## 2024-01-22 MED ORDER — KETOROLAC TROMETHAMINE 15 MG/ML IJ SOLN
15.0000 mg | Freq: Once | INTRAMUSCULAR | Status: AC
  Administered 2024-01-22: 15 mg via INTRAVENOUS
  Filled 2024-01-22: qty 1

## 2024-01-22 MED ORDER — MELOXICAM 15 MG PO TABS: 15.0000 mg | ORAL_TABLET | Freq: Every day | ORAL | 0 refills | Status: AC

## 2024-01-22 MED ORDER — IOHEXOL 300 MG/ML  SOLN
100.0000 mL | Freq: Once | INTRAMUSCULAR | Status: AC | PRN
  Administered 2024-01-22: 100 mL via INTRAVENOUS

## 2024-01-22 NOTE — Discharge Instructions (Signed)
 Please follow-up closely with your primary care doctor on an outpatient basis.  Return to emergency department immediately for any new or worsening symptoms.

## 2024-01-22 NOTE — ED Notes (Signed)
 Patient transported to CT

## 2024-01-22 NOTE — ED Notes (Signed)
 Pt verbally agreed to [facility] waiver. Signature pad not currently working
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
## 2024-03-30 ENCOUNTER — Emergency Department (HOSPITAL_COMMUNITY)
Admission: EM | Admit: 2024-03-30 | Discharge: 2024-03-30 | Disposition: A | Discharge status: Home / Self Care | Attending: Emergency Medicine | Admitting: Emergency Medicine

## 2024-03-30 ENCOUNTER — Other Ambulatory Visit: Payer: Self-pay

## 2024-03-30 ENCOUNTER — Encounter (HOSPITAL_COMMUNITY): Payer: Self-pay | Admitting: Emergency Medicine

## 2024-03-30 ENCOUNTER — Emergency Department (HOSPITAL_COMMUNITY)

## 2024-03-30 DIAGNOSIS — N200 Calculus of kidney: Secondary | ICD-10-CM | POA: Insufficient documentation

## 2024-03-30 DIAGNOSIS — R10A1 Flank pain, right side: Secondary | ICD-10-CM | POA: Diagnosis present

## 2024-03-30 DIAGNOSIS — Z79899 Other long term (current) drug therapy: Secondary | ICD-10-CM | POA: Diagnosis not present

## 2024-03-30 DIAGNOSIS — R319 Hematuria, unspecified: Secondary | ICD-10-CM

## 2024-03-30 DIAGNOSIS — Z96643 Presence of artificial hip joint, bilateral: Secondary | ICD-10-CM | POA: Insufficient documentation

## 2024-03-30 LAB — URINALYSIS, ROUTINE W REFLEX MICROSCOPIC
Bacteria, UA: NONE SEEN
Bilirubin Urine: NEGATIVE
Glucose, UA: NEGATIVE mg/dL
Ketones, ur: NEGATIVE mg/dL
Leukocytes,Ua: NEGATIVE
Nitrite: NEGATIVE
Protein, ur: NEGATIVE mg/dL
RBC / HPF: >50 RBC/hpf (ref 0–5)
Specific Gravity, Urine: 1.012 (ref 1.005–1.030)
pH: 8 (ref 5.0–8.0)

## 2024-03-30 MED ORDER — HYDROMORPHONE HCL 1 MG/ML IJ SOLN
1.0000 mg | Freq: Once | INTRAMUSCULAR | Status: AC
  Administered 2024-03-30: 1 mg via INTRAVENOUS
  Filled 2024-03-30: qty 1

## 2024-03-30 MED ORDER — KETOROLAC TROMETHAMINE 30 MG/ML IJ SOLN
30.0000 mg | Freq: Once | INTRAMUSCULAR | Status: AC
  Administered 2024-03-30: 30 mg via INTRAVENOUS
  Filled 2024-03-30: qty 1

## 2024-03-30 MED ORDER — ONDANSETRON HCL 4 MG/2ML IJ SOLN
4.0000 mg | Freq: Once | INTRAMUSCULAR | Status: AC
  Administered 2024-03-30: 4 mg via INTRAVENOUS
  Filled 2024-03-30: qty 2

## 2024-03-30 NOTE — ED Provider Notes (Signed)
 " [place] EMERGENCY DEPARTMENT AT [facility] Provider Note   CSN: [id number] Arrival date & time: 03/30/24  1150     Patient presents with: Flank Pain   Jorge Nichols is a 34 y.o. male.   Pt complains of flank pain.  Pt reports he has pain in his right flank.  Pt reports he has had multiple kidney stones in the past.  Pt has a history of hip problems and back pain.  He is currently on pain medication.  Pt denies any fever or chills.  Pt complains of nausea. Pt is followed by urology.   The history is provided by the patient. No language interpreter was used.  Flank Pain This is a new problem. The current episode started yesterday. The problem occurs constantly. The problem has not changed since onset.Nothing aggravates the symptoms. Nothing relieves the symptoms. He has tried nothing for the symptoms.       Prior to Admission medications  Medication Sig Start Date End Date Taking? Authorizing Provider  allopurinol  (ZYLOPRIM ) 100 MG tablet Take 200 mg by mouth daily as needed (gout flare). 03/23/23   [provider]  cephALEXin  (KEFLEX ) 500 MG capsule Take 1 capsule (500 mg total) by mouth 4 (four) times daily. 11/11/23   Suzette Pac, MD  ibuprofen  (ADVIL ) 800 MG tablet Take 1 tablet (800 mg total) by mouth every 8 (eight) hours as needed for moderate pain (pain score 4-6). 11/11/23   Zammit, Joseph, MD  ketorolac  (TORADOL ) 10 MG tablet Take 1 tablet (10 mg total) by mouth every 6 (six) hours as needed. 09/28/23   Daralene Lonni BIRCH, PA-C  meloxicam  (MOBIC ) 15 MG tablet Take 1 tablet (15 mg total) by mouth daily. 01/22/24   Daralene Lonni BIRCH, PA-C  methocarbamol  (ROBAXIN ) 750 MG tablet Take 1 tablet (750 mg total) by mouth 3 (three) times daily. 01/22/24   Daralene Lonni BIRCH, PA-C  metoprolol  succinate (TOPROL -XL) 50 MG 24 hr tablet Take 50 mg by mouth daily. 03/19/23   [provider]  ondansetron  (ZOFRAN -ODT) 4 MG disintegrating tablet Take 1  tablet (4 mg total) by mouth every 8 (eight) hours as needed for nausea or vomiting. 09/28/23   Daralene Lonni BIRCH, PA-C  oxyCODONE -acetaminophen  (PERCOCET) 7.5-325 MG tablet Take 1 tablet by mouth every 6 (six) hours as needed for severe pain. 12/22/22   Gerldine Lauraine BROCKS, FNP  tamsulosin  (FLOMAX ) 0.4 MG CAPS capsule Take 1 capsule (0.4 mg total) by mouth daily. 11/11/23   Zammit, Joseph, MD  Vitamin D , Ergocalciferol , (DRISDOL ) 1.25 MG (50000 UNIT) CAPS capsule Take 50,000 Units by mouth once a week. 03/03/23   [provider]    Allergies: Patient has no known allergies.    Review of Systems  Genitourinary:  Positive for flank pain.  All other systems reviewed and are negative.   Updated Vital Signs BP 136/84 (BP Location: Left Arm)   Pulse 76   Temp 98.6 F (37 C) (Oral)   Resp 18   SpO2 99%   Physical Exam Vitals and nursing note reviewed.  Constitutional:      Appearance: He is well-developed.  HENT:     Head: Normocephalic.  Cardiovascular:     Rate and Rhythm: Normal rate.  Pulmonary:     Effort: Pulmonary effort is normal.  Abdominal:     General: There is no distension.     Palpations: Abdomen is soft.  Musculoskeletal:        General: Normal range of  motion.     Cervical back: Normal range of motion.  Skin:    General: Skin is warm.  Neurological:     General: No focal deficit present.     Mental Status: He is alert and oriented to person, place, and time.     (all labs ordered are listed, but only abnormal results are displayed) Labs Reviewed  URINALYSIS, ROUTINE W REFLEX MICROSCOPIC - Abnormal; Notable for the following components:      Result Value   Hgb urine dipstick MODERATE (*)    All other components within normal limits    EKG: None  Radiology: CT Renal Stone Study Result Date: 03/30/2024 EXAM: CT ABDOMEN AND PELVIS WITHOUT CONTRAST 03/30/2024 02:41:45 PM TECHNIQUE: CT of the abdomen and pelvis was performed without the  administration of intravenous contrast. Multiplanar reformatted images are provided for review. Automated exposure control, iterative reconstruction, and/or weight-based adjustment of the mA/kV was utilized to reduce the radiation dose to as low as reasonably achievable. COMPARISON: CT chest and abdomen and pelvis 01/22/2024. CLINICAL HISTORY: Abdominal/flank pain, stone suspected. FINDINGS: LOWER CHEST: No acute abnormality. LIVER: The liver is unremarkable. GALLBLADDER AND BILE DUCTS: Gallbladder is unremarkable. No biliary ductal dilatation. SPLEEN: No acute abnormality. PANCREAS: No acute abnormality. ADRENAL GLANDS: No acute abnormality. KIDNEYS, URETERS AND BLADDER: There are punctate nonobstructing bilateral renal calculi measuring up to 4 mm. No hydronephrosis. No perinephric or periureteral stranding. There is limited evaluation of the bladder secondary to streak artifact in the pelvis. The bladder is grossly within normal limits. GI AND BOWEL: Stomach demonstrates no acute abnormality. There is no bowel obstruction. The appendix is within normal limits. PERITONEUM AND RETROPERITONEUM: No ascites. No free air. VASCULATURE: Aorta is normal in caliber. LYMPH NODES: No lymphadenopathy. REPRODUCTIVE ORGANS: No acute abnormality. BONES AND SOFT TISSUES: Bilateral hip arthroplasties are present. No acute osseous abnormality. No focal soft tissue abnormality. IMPRESSION: 1. Punctate nonobstructing bilateral renal calculi measuring up to 4 mm. 2. Limited evaluation of the bladder due to streak artifact in the pelvis. Electronically signed by: Greig Pique MD MD 03/30/2024 03:21 PM EST RP Workstation: [id number]     Procedures   Medications Ordered in the ED  ketorolac  (TORADOL ) 30 MG/ML injection 30 mg (has no administration in time range)  HYDROmorphone  (DILAUDID ) injection 1 mg (1 mg Intravenous Given 03/30/24 1247)  ondansetron  (ZOFRAN ) injection 4 mg (4 mg Intravenous Given 03/30/24 1247)  HYDROmorphone   (DILAUDID ) injection 1 mg (1 mg Intravenous Given 03/30/24 1508)                                    Medical Decision Making Patient complains of right flank pain.  Patient reports he has had kidney stones in the past and feels like he is passing a kidney stone.  Patient is on pain medicine at home but has had no relief with his chronic pain medicine.  Patient states he has noticed that his blood is dark in his urine.  Amount and/or Complexity of Data Reviewed Labs: ordered. Decision-making details documented in ED Course.    Details: Labs ordered reviewed and interpreted UA shows large blood greater than 50 white blood cells Radiology: ordered and independent interpretation performed. Decision-making details documented in ED Course.    Details: CT renal study ordered reviewed and interpreted.  CT shows bilateral hip prosthesis.  Patient has bilateral nonobstructing renal calculi.  No hydronephrosis no ureteral stone.  Risk  Prescription drug management. Risk Details: Patient is given IV Dilaudid  for pain.  He had some relief from his discomfort.  Patient is given Toradol  30 mg IV.  Patient is advised to resume his home medications schedule follow-up with Dr. Wrenn.  Patient is discharged in stable condition.        Final diagnoses:  Right flank pain  Hematuria, unspecified type    ED Discharge Orders     None       An After Visit Summary was printed and given to the patient.    Jorge Sonny POUR, PA-C 03/30/24 1611    Jorge Lamar BROCKS, MD 03/31/24 [phone]  "

## 2024-03-30 NOTE — ED Triage Notes (Signed)
 R flank pain, urinary retention x 2 days. Hx of kidney stones. C/o nausea
